# Patient Record
Sex: Female | Born: 1935
Health system: Southern US, Community
[De-identification: ages and names within clinical notes are randomized; demographics above are authoritative.]

## PROBLEM LIST (undated history)

## (undated) DIAGNOSIS — K649 Unspecified hemorrhoids: Secondary | ICD-10-CM

## (undated) DIAGNOSIS — I495 Sick sinus syndrome: Secondary | ICD-10-CM

## (undated) DIAGNOSIS — D649 Anemia, unspecified: Secondary | ICD-10-CM

## (undated) DIAGNOSIS — C50919 Malignant neoplasm of unspecified site of unspecified female breast: Secondary | ICD-10-CM

## (undated) DIAGNOSIS — I2510 Atherosclerotic heart disease of native coronary artery without angina pectoris: Secondary | ICD-10-CM

## (undated) DIAGNOSIS — I509 Heart failure, unspecified: Secondary | ICD-10-CM

## (undated) DIAGNOSIS — I499 Cardiac arrhythmia, unspecified: Secondary | ICD-10-CM

## (undated) DIAGNOSIS — J45909 Unspecified asthma, uncomplicated: Secondary | ICD-10-CM

## (undated) DIAGNOSIS — K222 Esophageal obstruction: Secondary | ICD-10-CM

## (undated) DIAGNOSIS — M810 Age-related osteoporosis without current pathological fracture: Secondary | ICD-10-CM

## (undated) DIAGNOSIS — I739 Peripheral vascular disease, unspecified: Secondary | ICD-10-CM

## (undated) DIAGNOSIS — G40909 Epilepsy, unspecified, not intractable, without status epilepticus: Secondary | ICD-10-CM

## (undated) DIAGNOSIS — E785 Hyperlipidemia, unspecified: Secondary | ICD-10-CM

## (undated) DIAGNOSIS — J449 Chronic obstructive pulmonary disease, unspecified: Secondary | ICD-10-CM

## (undated) DIAGNOSIS — Z96659 Presence of unspecified artificial knee joint: Secondary | ICD-10-CM

## (undated) DIAGNOSIS — I10 Essential (primary) hypertension: Secondary | ICD-10-CM

## (undated) DIAGNOSIS — I82409 Acute embolism and thrombosis of unspecified deep veins of unspecified lower extremity: Secondary | ICD-10-CM

## (undated) DIAGNOSIS — I255 Ischemic cardiomyopathy: Secondary | ICD-10-CM

## (undated) DIAGNOSIS — I480 Paroxysmal atrial fibrillation: Secondary | ICD-10-CM

## (undated) HISTORY — DX: Hyperlipidemia, unspecified: E78.5

## (undated) HISTORY — PX: HEMORRHOID SURGERY: SHX153

## (undated) HISTORY — DX: Cardiac arrhythmia, unspecified: I49.9

## (undated) HISTORY — DX: Chronic obstructive pulmonary disease, unspecified: J44.9

## (undated) HISTORY — DX: Ischemic cardiomyopathy: I25.5

## (undated) HISTORY — DX: Peripheral vascular disease, unspecified: I73.9

## (undated) HISTORY — DX: Unspecified hemorrhoids: K64.9

## (undated) HISTORY — DX: Sick sinus syndrome: I49.5

## (undated) HISTORY — DX: Atherosclerotic heart disease of native coronary artery without angina pectoris: I25.10

## (undated) HISTORY — DX: Unspecified asthma, uncomplicated: J45.909

## (undated) HISTORY — DX: Acute embolism and thrombosis of unspecified deep veins of unspecified lower extremity: I82.409

## (undated) HISTORY — PX: ILIAC ARTERY STENT: SHX1786

## (undated) HISTORY — DX: Essential (primary) hypertension: I10

## (undated) HISTORY — DX: Anemia, unspecified: D64.9

## (undated) HISTORY — DX: Epilepsy, unspecified, not intractable, without status epilepticus: G40.909

## (undated) HISTORY — PX: INTRAOPERATIVE ARTERIOGRAM: SHX5157

---

## 1976-09-22 DIAGNOSIS — C50919 Malignant neoplasm of unspecified site of unspecified female breast: Secondary | ICD-10-CM

## 1976-09-22 HISTORY — PX: MASTECTOMY: SHX3

## 1976-09-22 HISTORY — DX: Malignant neoplasm of unspecified site of unspecified female breast: C50.919

## 1983-09-23 HISTORY — PX: ABDOMINAL HYSTERECTOMY: SHX81

## 1983-09-23 HISTORY — PX: REDUCTION MAMMAPLASTY: SUR839

## 1998-05-21 ENCOUNTER — Encounter: Admission: RE | Admit: 1998-05-21 | Discharge: 1998-08-19 | Payer: Self-pay

## 1998-06-11 ENCOUNTER — Ambulatory Visit (HOSPITAL_COMMUNITY): Admission: RE | Admit: 1998-06-11 | Discharge: 1998-06-11 | Payer: Self-pay | Admitting: *Deleted

## 1999-01-09 ENCOUNTER — Encounter: Payer: Self-pay | Admitting: Orthopedic Surgery

## 1999-01-10 ENCOUNTER — Encounter: Payer: Self-pay | Admitting: Orthopedic Surgery

## 1999-01-10 ENCOUNTER — Inpatient Hospital Stay (HOSPITAL_COMMUNITY): Admission: RE | Admit: 1999-01-10 | Discharge: 1999-01-14 | Payer: Self-pay | Admitting: Orthopedic Surgery

## 1999-01-14 ENCOUNTER — Inpatient Hospital Stay (HOSPITAL_COMMUNITY)
Admission: RE | Admit: 1999-01-14 | Discharge: 1999-01-21 | Payer: Self-pay | Admitting: Physical Medicine & Rehabilitation

## 1999-01-14 ENCOUNTER — Encounter: Payer: Self-pay | Admitting: Physical Medicine & Rehabilitation

## 1999-05-03 ENCOUNTER — Inpatient Hospital Stay (HOSPITAL_COMMUNITY): Admission: EM | Admit: 1999-05-03 | Discharge: 1999-05-09 | Payer: Self-pay | Admitting: Orthopedic Surgery

## 2004-04-18 ENCOUNTER — Emergency Department (HOSPITAL_COMMUNITY): Admission: EM | Admit: 2004-04-18 | Discharge: 2004-04-18 | Payer: Self-pay | Admitting: Internal Medicine

## 2004-06-27 ENCOUNTER — Ambulatory Visit: Payer: Self-pay

## 2005-04-17 ENCOUNTER — Ambulatory Visit: Payer: Self-pay

## 2005-06-16 ENCOUNTER — Ambulatory Visit: Payer: Self-pay | Admitting: Unknown Physician Specialty

## 2005-06-30 ENCOUNTER — Ambulatory Visit: Payer: Self-pay | Admitting: Internal Medicine

## 2006-07-20 ENCOUNTER — Ambulatory Visit: Payer: Self-pay | Admitting: Internal Medicine

## 2006-12-16 ENCOUNTER — Ambulatory Visit: Payer: Self-pay | Admitting: Internal Medicine

## 2007-07-23 ENCOUNTER — Ambulatory Visit: Payer: Self-pay | Admitting: Internal Medicine

## 2007-10-26 ENCOUNTER — Inpatient Hospital Stay: Payer: Self-pay | Admitting: Cardiology

## 2007-10-27 ENCOUNTER — Other Ambulatory Visit: Payer: Self-pay

## 2007-10-28 ENCOUNTER — Other Ambulatory Visit: Payer: Self-pay

## 2008-07-05 ENCOUNTER — Ambulatory Visit: Payer: Self-pay | Admitting: Internal Medicine

## 2008-07-24 ENCOUNTER — Ambulatory Visit: Payer: Self-pay | Admitting: Internal Medicine

## 2009-09-04 ENCOUNTER — Ambulatory Visit: Payer: Self-pay | Admitting: Internal Medicine

## 2010-02-26 ENCOUNTER — Inpatient Hospital Stay (HOSPITAL_COMMUNITY): Admission: RE | Admit: 2010-02-26 | Discharge: 2010-03-03 | Payer: Self-pay | Admitting: Orthopaedic Surgery

## 2010-03-21 ENCOUNTER — Emergency Department (HOSPITAL_COMMUNITY): Admission: EM | Admit: 2010-03-21 | Discharge: 2010-03-21 | Payer: Self-pay | Admitting: Emergency Medicine

## 2010-11-14 ENCOUNTER — Ambulatory Visit: Payer: Self-pay | Admitting: Internal Medicine

## 2010-12-08 LAB — CBC
MCH: 32.5 pg (ref 26.0–34.0)
MCHC: 33.7 g/dL (ref 30.0–36.0)
Platelets: 268 10*3/uL (ref 150–400)
RBC: 3.24 MIL/uL — ABNORMAL LOW (ref 3.87–5.11)

## 2010-12-08 LAB — DIFFERENTIAL
Basophils Relative: 1 % (ref 0–1)
Eosinophils Absolute: 0 10*3/uL (ref 0.0–0.7)
Lymphs Abs: 1.3 10*3/uL (ref 0.7–4.0)
Neutro Abs: 2.6 10*3/uL (ref 1.7–7.7)
Neutrophils Relative %: 60 % (ref 43–77)

## 2010-12-08 LAB — BASIC METABOLIC PANEL
CO2: 28 mEq/L (ref 19–32)
Calcium: 9.2 mg/dL (ref 8.4–10.5)
Creatinine, Ser: 0.69 mg/dL (ref 0.4–1.2)
GFR calc Af Amer: >60 mL/min (ref >60)
GFR calc non Af Amer: >60 mL/min (ref >60)
Sodium: 141 mEq/L (ref 135–145)

## 2010-12-08 LAB — POCT CARDIAC MARKERS
CKMB, poc: <1 ng/mL — ABNORMAL LOW (ref 1.0–8.0)
Myoglobin, poc: 115 ng/mL (ref 12–200)

## 2010-12-09 LAB — CBC
HCT: 24.5 % — ABNORMAL LOW (ref 36.0–46.0)
Hemoglobin: 10.1 g/dL — ABNORMAL LOW (ref 12.0–15.0)
Hemoglobin: 8.4 g/dL — ABNORMAL LOW (ref 12.0–15.0)
MCHC: 34.5 g/dL (ref 30.0–36.0)
MCV: 96.7 fL (ref 78.0–100.0)
Platelets: 131 10*3/uL — ABNORMAL LOW (ref 150–400)
Platelets: 146 10*3/uL — ABNORMAL LOW (ref 150–400)
Platelets: 173 10*3/uL (ref 150–400)
RBC: 2.62 MIL/uL — ABNORMAL LOW (ref 3.87–5.11)
RDW: 12.8 % (ref 11.5–15.5)
RDW: 12.9 % (ref 11.5–15.5)
WBC: 7.1 10*3/uL (ref 4.0–10.5)
WBC: 7.6 10*3/uL (ref 4.0–10.5)
WBC: 7.7 10*3/uL (ref 4.0–10.5)

## 2010-12-09 LAB — BASIC METABOLIC PANEL
BUN: 12 mg/dL (ref 6–23)
CO2: 28 mEq/L (ref 19–32)
Calcium: 8.4 mg/dL (ref 8.4–10.5)
Calcium: 8.5 mg/dL (ref 8.4–10.5)
Calcium: 8.6 mg/dL (ref 8.4–10.5)
Calcium: 9.4 mg/dL (ref 8.4–10.5)
Chloride: 103 mEq/L (ref 96–112)
Creatinine, Ser: 0.76 mg/dL (ref 0.4–1.2)
Creatinine, Ser: 0.82 mg/dL (ref 0.4–1.2)
Creatinine, Ser: 0.9 mg/dL (ref 0.4–1.2)
GFR calc Af Amer: >60 mL/min (ref >60)
GFR calc Af Amer: >60 mL/min (ref >60)
GFR calc non Af Amer: 54 mL/min — ABNORMAL LOW (ref >60)
GFR calc non Af Amer: 59 mL/min — ABNORMAL LOW (ref >60)
GFR calc non Af Amer: >60 mL/min (ref >60)
GFR calc non Af Amer: >60 mL/min (ref >60)
Glucose, Bld: 123 mg/dL — ABNORMAL HIGH (ref 70–99)
Glucose, Bld: 89 mg/dL (ref 70–99)
Potassium: 3.6 mEq/L (ref 3.5–5.1)
Sodium: 139 mEq/L (ref 135–145)
Sodium: 140 mEq/L (ref 135–145)

## 2010-12-09 LAB — PROTIME-INR
INR: 1.12 (ref 0.00–1.49)
INR: 1.71 — ABNORMAL HIGH (ref 0.00–1.49)
INR: 2.56 — ABNORMAL HIGH (ref 0.00–1.49)
INR: 2.8 — ABNORMAL HIGH (ref 0.00–1.49)
INR: 2.9 — ABNORMAL HIGH (ref 0.00–1.49)
Prothrombin Time: 14.3 seconds (ref 11.6–15.2)
Prothrombin Time: 14.9 seconds (ref 11.6–15.2)
Prothrombin Time: 27.3 seconds — ABNORMAL HIGH (ref 11.6–15.2)
Prothrombin Time: 29.3 seconds — ABNORMAL HIGH (ref 11.6–15.2)

## 2011-03-19 ENCOUNTER — Other Ambulatory Visit (HOSPITAL_COMMUNITY): Payer: Self-pay | Admitting: Orthopaedic Surgery

## 2011-03-19 DIAGNOSIS — T84038A Mechanical loosening of other internal prosthetic joint, initial encounter: Secondary | ICD-10-CM

## 2011-03-28 ENCOUNTER — Ambulatory Visit (HOSPITAL_COMMUNITY)
Admission: RE | Admit: 2011-03-28 | Discharge: 2011-03-28 | Disposition: A | Discharge status: Home / Self Care | Payer: Medicare Other | Source: Ambulatory Visit | Attending: Orthopaedic Surgery | Admitting: Orthopaedic Surgery

## 2011-03-28 ENCOUNTER — Encounter (HOSPITAL_COMMUNITY)
Admission: RE | Admit: 2011-03-28 | Discharge: 2011-03-28 | Disposition: A | Discharge status: Home / Self Care | Payer: Medicare Other | Source: Ambulatory Visit | Attending: Orthopaedic Surgery | Admitting: Orthopaedic Surgery

## 2011-03-28 ENCOUNTER — Other Ambulatory Visit (HOSPITAL_COMMUNITY): Payer: Self-pay | Admitting: Orthopaedic Surgery

## 2011-03-28 ENCOUNTER — Encounter (HOSPITAL_COMMUNITY): Payer: Self-pay

## 2011-03-28 DIAGNOSIS — Z853 Personal history of malignant neoplasm of breast: Secondary | ICD-10-CM | POA: Insufficient documentation

## 2011-03-28 DIAGNOSIS — T84038A Mechanical loosening of other internal prosthetic joint, initial encounter: Secondary | ICD-10-CM

## 2011-03-28 DIAGNOSIS — M47817 Spondylosis without myelopathy or radiculopathy, lumbosacral region: Secondary | ICD-10-CM | POA: Insufficient documentation

## 2011-03-28 DIAGNOSIS — M25569 Pain in unspecified knee: Secondary | ICD-10-CM | POA: Insufficient documentation

## 2011-03-28 DIAGNOSIS — Z96659 Presence of unspecified artificial knee joint: Secondary | ICD-10-CM | POA: Insufficient documentation

## 2011-03-28 HISTORY — DX: Malignant neoplasm of unspecified site of unspecified female breast: C50.919

## 2011-03-28 HISTORY — DX: Presence of unspecified artificial knee joint: Z96.659

## 2011-03-28 MED ORDER — TECHNETIUM TC 99M MEDRONATE IV KIT
23.5000 | PACK | Freq: Once | INTRAVENOUS | Status: AC | PRN
  Administered 2011-03-28: 23.5 via INTRAVENOUS

## 2011-12-04 ENCOUNTER — Ambulatory Visit: Payer: Self-pay | Admitting: Internal Medicine

## 2012-11-12 ENCOUNTER — Other Ambulatory Visit (HOSPITAL_COMMUNITY): Payer: Self-pay | Admitting: Orthopedic Surgery

## 2012-11-23 ENCOUNTER — Encounter (HOSPITAL_COMMUNITY)
Admission: RE | Admit: 2012-11-23 | Discharge: 2012-11-23 | Disposition: A | Discharge status: Home / Self Care | Payer: Medicare Other | Source: Ambulatory Visit | Attending: Orthopedic Surgery | Admitting: Orthopedic Surgery

## 2012-11-23 DIAGNOSIS — M25561 Pain in right knee: Secondary | ICD-10-CM

## 2012-11-23 DIAGNOSIS — M25569 Pain in unspecified knee: Secondary | ICD-10-CM | POA: Insufficient documentation

## 2012-11-23 MED ORDER — TECHNETIUM TC 99M MEDRONATE IV KIT
25.0000 | PACK | Freq: Once | INTRAVENOUS | Status: AC | PRN
  Administered 2012-11-23: 25 via INTRAVENOUS

## 2013-03-30 ENCOUNTER — Ambulatory Visit: Payer: Self-pay | Admitting: Internal Medicine

## 2013-12-29 DIAGNOSIS — I10 Essential (primary) hypertension: Secondary | ICD-10-CM | POA: Insufficient documentation

## 2013-12-29 DIAGNOSIS — E785 Hyperlipidemia, unspecified: Secondary | ICD-10-CM | POA: Insufficient documentation

## 2014-03-06 DIAGNOSIS — R0602 Shortness of breath: Secondary | ICD-10-CM | POA: Insufficient documentation

## 2014-07-04 ENCOUNTER — Ambulatory Visit: Payer: Self-pay | Admitting: Internal Medicine

## 2014-10-27 ENCOUNTER — Other Ambulatory Visit: Payer: Self-pay | Admitting: Otolaryngology

## 2014-10-27 DIAGNOSIS — J029 Acute pharyngitis, unspecified: Secondary | ICD-10-CM

## 2014-10-27 DIAGNOSIS — H9201 Otalgia, right ear: Secondary | ICD-10-CM

## 2014-11-01 ENCOUNTER — Ambulatory Visit
Admission: RE | Admit: 2014-11-01 | Discharge: 2014-11-01 | Disposition: A | Discharge status: Home / Self Care | Payer: Medicare Other | Source: Ambulatory Visit | Attending: Otolaryngology | Admitting: Otolaryngology

## 2014-11-01 DIAGNOSIS — J029 Acute pharyngitis, unspecified: Secondary | ICD-10-CM

## 2014-11-01 DIAGNOSIS — H9201 Otalgia, right ear: Secondary | ICD-10-CM

## 2014-11-01 MED ORDER — IOHEXOL 300 MG/ML  SOLN
75.0000 mL | Freq: Once | INTRAMUSCULAR | Status: AC | PRN
  Administered 2014-11-01: 75 mL via INTRAVENOUS

## 2014-11-21 ENCOUNTER — Encounter: Payer: Self-pay | Admitting: Vascular Surgery

## 2014-12-04 ENCOUNTER — Encounter: Payer: Self-pay | Admitting: Vascular Surgery

## 2014-12-05 ENCOUNTER — Ambulatory Visit (INDEPENDENT_AMBULATORY_CARE_PROVIDER_SITE_OTHER): Payer: Medicare Other | Admitting: Vascular Surgery

## 2014-12-05 ENCOUNTER — Encounter: Payer: Self-pay | Admitting: Vascular Surgery

## 2014-12-05 VITALS — BP 176/101 | HR 73 | Resp 16 | Ht 63.0 in | Wt 184.0 lb

## 2014-12-05 DIAGNOSIS — I748 Embolism and thrombosis of other arteries: Secondary | ICD-10-CM | POA: Diagnosis not present

## 2014-12-05 DIAGNOSIS — I708 Atherosclerosis of other arteries: Secondary | ICD-10-CM

## 2014-12-05 NOTE — Progress Notes (Signed)
Patient name: [name] [name] MRN: [id number] DOB: [birth year] Sex: female   Referred by: Janace Hoard  Reason for referral:  Chief Complaint  Patient presents with  . New Evaluation    Subclavian artery occlusion referred by Dr Melissa Montane    HISTORY OF PRESENT ILLNESS: The patient is today for evaluation of incidental finding of left subclavian artery calcification and probable total occlusion. She has a long history of COPD and was having increased shortness of breath, sore throat and hoarseness. ENT evaluation with Dr. Janace Hoard included CT scan. I have reviewed her actual images and the report of this. This does show complete calcification and probable total occlusion of the origin of her subclavian artery. The artery distal to this is widely patent. He is seen today for further discussion of this. She does have arthritic changes in her shoulders bilaterally with a prior 2 different operations on her right shoulder. She reports that she has bilateral arm fatigue. No worse left than right. She is left-handed. She has had prior left breast surgery and therefore does not ever take blood pressure from her left arms and is not a differential in blood pressure in the past. She does have a history of irregular heart rate and does have a permanent pacemaker.  Past Medical History  Diagnosis Date  . Breast cancer   . History of knee replacement, total   . Hypertension   . Peripheral arterial disease   . CAD (coronary artery disease)   . Hemorrhoids   . Seizure disorder   . Ischemic cardiomyopathy   . DVT (deep venous thrombosis)     Past Surgical History  Procedure Laterality Date  . Hemorrhoid surgery    . Intraoperative arteriogram Right   . Iliac artery stent      History   Social History  . Marital Status: Widowed    Spouse Name: N/A  . Number of Children: N/A  . Years of Education: N/A   Occupational History  . Not on file.   Social History Main Topics  . Smoking status: Former  Smoker    Quit date: 12/05/1986  . Smokeless tobacco: Never Used  . Alcohol Use: No  . Drug Use: No  . Sexual Activity: Not on file   Other Topics Concern  . Not on file   Social History Narrative    Family History  Problem Relation Age of Onset  . Cancer Mother     breast  . Hypertension Father   . Heart disease Father   . Clotting disorder Father   . Heart disease Sister   . Thyroid disease Sister   . Diabetes Sister     Allergies as of 12/05/2014 - Review Complete 12/05/2014  Allergen Reaction Noted  . Penicillins Anaphylaxis 11/21/2014  . Nsaids  12/05/2014    Current Outpatient Prescriptions on File Prior to Visit  Medication Sig Dispense Refill  . aspirin 325 MG EC tablet Take 325 mg by mouth daily.    Marland Kitchen atorvastatin (LIPITOR) 10 MG tablet Take 10 mg by mouth daily.    . furosemide (LASIX) 40 MG tablet Take 20 mg by mouth.     . carvedilol (COREG) 25 MG tablet Take 25 mg by mouth 2 (two) times daily with a meal.    . cloNIDine (CATAPRES) 0.2 MG tablet Take 0.2 mg by mouth 2 (two) times daily.    . clopidogrel (PLAVIX) 75 MG tablet Take 75 mg by mouth daily.    Marland Kitchen  ezetimibe (ZETIA) 10 MG tablet Take 10 mg by mouth daily.    Marland Kitchen olmesartan-hydrochlorothiazide (BENICAR HCT) 40-25 MG per tablet Take 1 tablet by mouth daily.    . sildenafil (VIAGRA) 100 MG tablet Take 100 mg by mouth daily as needed for erectile dysfunction.    Marland Kitchen Umeclidinium-Vilanterol 62.5-25 MCG/INH AEPB Inhale into the lungs.    . vardenafil (LEVITRA) 20 MG tablet Take 20 mg by mouth daily as needed for erectile dysfunction.     No current facility-administered medications on file prior to visit.     REVIEW OF SYSTEMS:  Positives indicated with an "X"  CARDIOVASCULAR:  [ ]  chest pain   [ ]  chest pressure   [ ]  palpitations   [ x] orthopnea   [x ] dyspnea on exertion   [ ]  claudication   [ ]  rest pain   [ ]  DVT   [ ]  phlebitis PULMONARY:   [ ]  productive cough   [ ]  asthma   [ ]   wheezing NEUROLOGIC:   [ x] weakness  [ ]  paresthesias  [ ]  aphasia  [ ]  amaurosis  [ ]  dizziness HEMATOLOGIC:   [ ]  bleeding problems   [ ]  clotting disorders MUSCULOSKELETAL:  [ ]  joint pain   [ ]  joint swelling GASTROINTESTINAL: [ ]   blood in stool  [ ]   hematemesis GENITOURINARY:  [ ]   dysuria  [ ]   hematuria PSYCHIATRIC:  [ ]  history of major depression INTEGUMENTARY:  [ ]  rashes  [ ]  ulcers CONSTITUTIONAL:  [ ]  fever   [ ]  chills  PHYSICAL EXAMINATION:  General: The patient is a well-nourished female, in no acute distress. Vital signs are BP 176/101 mmHg  Pulse 73  Resp 16  Ht 5\' 3"  (1.6 m)  Wt 184 lb (83.462 kg)  BMI 32.60 kg/m2 Pulmonary: There is a good air exchange bilaterally without wheezing or rales. Abdomen: Soft and non-tender with normal pitch bowel sounds. Musculoskeletal: There are no major deformities.  There is no significant extremity pain. Neurologic: No focal weakness or paresthesias are detected, Skin: There are no ulcer or rashes noted. Psychiatric: The patient has normal affect. Cardiovascular: There is a regular rate and rhythm without significant murmur appreciated. Pulse status: 2+ radial pulses which are equal bilaterally. 2+ dorsalis pedis pulses which are equal bilaterally as well Carotid arteries without bruits  CT scan was reviewed and discussed with the patient. This shows minimal calcification her carotid bifurcation bilaterally. She does have extensive calcification in various areas of her aortic arch and the apparent total occlusion with calcified plaque of her proximal subclavian takeoff on the left.2  Impression and Plan:  A symptomatic left subclavian artery occlusion. I discussed this at great length with the patient. She has no posterior circulation type symptoms and no evidence of ischemia in her left arm. No prior history of embolic events. I explained that this is an incidental finding and does not cause her any risk for stroke or other  major medical problem. Explained would not recommend any further evaluation or follow-up regarding this. She was reassured with this discussion will see [place] again on an as-needed basis    Sanela [name] [facility] Office: [phone]

## 2015-03-23 DIAGNOSIS — I4891 Unspecified atrial fibrillation: Secondary | ICD-10-CM | POA: Insufficient documentation

## 2015-09-10 ENCOUNTER — Encounter (HOSPITAL_COMMUNITY): Payer: Self-pay | Admitting: Family Medicine

## 2015-09-10 ENCOUNTER — Emergency Department (HOSPITAL_COMMUNITY)
Admission: EM | Admit: 2015-09-10 | Discharge: 2015-09-10 | Disposition: A | Discharge status: Home / Self Care | Payer: Medicare Other | Attending: Emergency Medicine | Admitting: Emergency Medicine

## 2015-09-10 DIAGNOSIS — I10 Essential (primary) hypertension: Secondary | ICD-10-CM | POA: Diagnosis not present

## 2015-09-10 DIAGNOSIS — Z880 Allergy status to penicillin: Secondary | ICD-10-CM | POA: Diagnosis not present

## 2015-09-10 DIAGNOSIS — Z96659 Presence of unspecified artificial knee joint: Secondary | ICD-10-CM | POA: Insufficient documentation

## 2015-09-10 DIAGNOSIS — I2510 Atherosclerotic heart disease of native coronary artery without angina pectoris: Secondary | ICD-10-CM | POA: Diagnosis not present

## 2015-09-10 DIAGNOSIS — Z853 Personal history of malignant neoplasm of breast: Secondary | ICD-10-CM | POA: Insufficient documentation

## 2015-09-10 DIAGNOSIS — Z87891 Personal history of nicotine dependence: Secondary | ICD-10-CM | POA: Diagnosis not present

## 2015-09-10 DIAGNOSIS — Z8719 Personal history of other diseases of the digestive system: Secondary | ICD-10-CM | POA: Insufficient documentation

## 2015-09-10 DIAGNOSIS — Z86718 Personal history of other venous thrombosis and embolism: Secondary | ICD-10-CM | POA: Insufficient documentation

## 2015-09-10 DIAGNOSIS — Z9861 Coronary angioplasty status: Secondary | ICD-10-CM | POA: Diagnosis not present

## 2015-09-10 DIAGNOSIS — Z8669 Personal history of other diseases of the nervous system and sense organs: Secondary | ICD-10-CM | POA: Diagnosis not present

## 2015-09-10 DIAGNOSIS — Y9389 Activity, other specified: Secondary | ICD-10-CM | POA: Insufficient documentation

## 2015-09-10 DIAGNOSIS — Y9289 Other specified places as the place of occurrence of the external cause: Secondary | ICD-10-CM | POA: Diagnosis not present

## 2015-09-10 DIAGNOSIS — S00531A Contusion of lip, initial encounter: Secondary | ICD-10-CM

## 2015-09-10 DIAGNOSIS — Z7982 Long term (current) use of aspirin: Secondary | ICD-10-CM | POA: Insufficient documentation

## 2015-09-10 DIAGNOSIS — Z950 Presence of cardiac pacemaker: Secondary | ICD-10-CM | POA: Insufficient documentation

## 2015-09-10 DIAGNOSIS — Z79899 Other long term (current) drug therapy: Secondary | ICD-10-CM | POA: Diagnosis not present

## 2015-09-10 DIAGNOSIS — Y998 Other external cause status: Secondary | ICD-10-CM | POA: Insufficient documentation

## 2015-09-10 DIAGNOSIS — X58XXXA Exposure to other specified factors, initial encounter: Secondary | ICD-10-CM | POA: Diagnosis not present

## 2015-09-10 DIAGNOSIS — S0993XA Unspecified injury of face, initial encounter: Secondary | ICD-10-CM | POA: Diagnosis present

## 2015-09-10 DIAGNOSIS — Z7902 Long term (current) use of antithrombotics/antiplatelets: Secondary | ICD-10-CM | POA: Diagnosis not present

## 2015-09-10 LAB — COMPREHENSIVE METABOLIC PANEL
ALBUMIN: 3.7 g/dL (ref 3.5–5.0)
ALK PHOS: 92 U/L (ref 38–126)
ALT: 14 U/L (ref 14–54)
AST: 23 U/L (ref 15–41)
Anion gap: 10 (ref 5–15)
BUN: 21 mg/dL — AB (ref 6–20)
CALCIUM: 9.3 mg/dL (ref 8.9–10.3)
CO2: 25 mmol/L (ref 22–32)
CREATININE: 1.01 mg/dL — AB (ref 0.44–1.00)
Chloride: 107 mmol/L (ref 101–111)
GFR calc Af Amer: 57 mL/min — ABNORMAL LOW (ref >60)
GFR calc non Af Amer: 49 mL/min — ABNORMAL LOW (ref >60)
GLUCOSE: 112 mg/dL — AB (ref 65–99)
Potassium: 4.1 mmol/L (ref 3.5–5.1)
Sodium: 142 mmol/L (ref 135–145)
Total Bilirubin: 0.7 mg/dL (ref 0.3–1.2)
Total Protein: 6.8 g/dL (ref 6.5–8.1)

## 2015-09-10 LAB — CBC WITH DIFFERENTIAL/PLATELET
BASOS PCT: 0 %
Basophils Absolute: 0 10*3/uL (ref 0.0–0.1)
EOS PCT: 1 %
Eosinophils Absolute: 0.1 10*3/uL (ref 0.0–0.7)
HCT: 39.6 % (ref 36.0–46.0)
HEMOGLOBIN: 12.8 g/dL (ref 12.0–15.0)
Lymphocytes Relative: 26 %
Lymphs Abs: 2.1 10*3/uL (ref 0.7–4.0)
MCH: 29.6 pg (ref 26.0–34.0)
MCHC: 32.3 g/dL (ref 30.0–36.0)
MCV: 91.7 fL (ref 78.0–100.0)
Monocytes Absolute: 0.7 10*3/uL (ref 0.1–1.0)
Monocytes Relative: 8 %
NEUTROS PCT: 65 %
Neutro Abs: 5.3 10*3/uL (ref 1.7–7.7)
Platelets: 183 10*3/uL (ref 150–400)
RBC: 4.32 MIL/uL (ref 3.87–5.11)
RDW: 13.9 % (ref 11.5–15.5)
WBC: 8.2 10*3/uL (ref 4.0–10.5)

## 2015-09-10 LAB — PROTIME-INR
INR: 3.65 — ABNORMAL HIGH (ref 0.00–1.49)
Prothrombin Time: 35.4 seconds — ABNORMAL HIGH (ref 11.6–15.2)

## 2015-09-10 NOTE — ED Provider Notes (Signed)
CSN: [id number]     Arrival date & time 09/10/15  1703 History   First MD Initiated Contact with Patient 09/10/15 1932     Chief Complaint  Patient presents with  . Bleeding/Bruising     (Consider location/radiation/quality/duration/timing/severity/associated sxs/prior Treatment) HPI Comments:  Patient presents with pain and swelling and bruising to her lower lip. She states she first noticed it around 3 or 4 PM. He does not recall any falls or trauma. She is concerned because she takes aspirin as well as Coumadin. She is no longer on Plavix. No difficulty breathing or swallowing. No chest pain. No shortness of breath. States she was started on Coumadin several months ago for unclear reasons per her. Chart review shows that she has a history of proximal atrial fibrillation and sick sinus syndrome with pacemaker implanted. She also has a known left subclavian arterial occlusion that was deemed not clinically significant by vascular surgery.  The history is provided by the patient. The history is limited by the condition of the patient.    Past Medical History  Diagnosis Date  . Breast cancer ([place])   . History of knee replacement, total   . Hypertension   . Peripheral arterial disease ([place])   . CAD (coronary artery disease)   . Hemorrhoids   . Seizure disorder ([place])   . Ischemic cardiomyopathy   . DVT (deep venous thrombosis) [facility])    Past Surgical History  Procedure Laterality Date  . Hemorrhoid surgery    . Intraoperative arteriogram Right   . Iliac artery stent     Family History  Problem Relation Age of Onset  . Cancer Mother     breast  . Hypertension Father   . Heart disease Father   . Clotting disorder Father   . Heart disease Sister   . Thyroid disease Sister   . Diabetes Sister    Social History  Substance Use Topics  . Smoking status: Former Smoker    Quit date: 12/05/1986  . Smokeless tobacco: Never Used  . Alcohol Use: No   OB History    No data  available     Review of Systems  Constitutional: Negative for fever, activity change and appetite change.  HENT: Negative for congestion.   Eyes: Negative for visual disturbance.  Respiratory: Negative for cough, chest tightness and shortness of breath.   Cardiovascular: Negative for chest pain.  Gastrointestinal: Negative for nausea, vomiting and abdominal pain.  Genitourinary: Negative for dysuria, hematuria, vaginal bleeding and vaginal discharge.  Musculoskeletal: Negative for myalgias and arthralgias.  Skin: Positive for wound.  Neurological: Negative for dizziness, weakness, light-headedness, numbness and headaches.  A complete 10 system review of systems was obtained and all systems are negative except as noted in the HPI and PMH.      Allergies  Penicillins and Nsaids  Home Medications   Prior to Admission medications   Medication Sig Start Date End Date Taking? Authorizing Provider  albuterol (PROVENTIL HFA;VENTOLIN HFA) 108 (90 BASE) MCG/ACT inhaler Inhale 2 puffs into the lungs every 6 (six) hours as needed for wheezing or shortness of breath.    Historical Provider, MD  Albuterol Sulfate (PROAIR HFA IN) Inhale into the lungs.    Historical Provider, MD  amLODipine (NORVASC) 2.5 MG tablet Take 2.5 mg by mouth daily.    Historical Provider, MD  aspirin 325 MG EC tablet Take 325 mg by mouth daily.    Historical Provider, MD  atorvastatin (LIPITOR) 10 MG tablet Take 10  mg by mouth daily.    Historical Provider, MD  carvedilol (COREG) 25 MG tablet Take 25 mg by mouth 2 (two) times daily with a meal.    Historical Provider, MD  cloNIDine (CATAPRES) 0.2 MG tablet Take 0.2 mg by mouth 2 (two) times daily.    Historical Provider, MD  clopidogrel (PLAVIX) 75 MG tablet Take 75 mg by mouth daily.    Historical Provider, MD  ezetimibe (ZETIA) 10 MG tablet Take 10 mg by mouth daily.    Historical Provider, MD  furosemide (LASIX) 40 MG tablet Take 20 mg by mouth.     Historical  Provider, MD  metoprolol succinate (TOPROL-XL) 100 MG 24 hr tablet Take 100 mg by mouth daily. Take with or immediately following a meal.    Historical Provider, MD  montelukast (SINGULAIR) 10 MG tablet Take 10 mg by mouth at bedtime.    Historical Provider, MD  olmesartan-hydrochlorothiazide (BENICAR HCT) 40-25 MG per tablet Take 1 tablet by mouth daily.    Historical Provider, MD  omeprazole (PRILOSEC) 40 MG capsule Take 40 mg by mouth daily.    Historical Provider, MD  quinapril (ACCUPRIL) 40 MG tablet Take 40 mg by mouth daily.    Historical Provider, MD  sildenafil (VIAGRA) 100 MG tablet Take 100 mg by mouth daily as needed for erectile dysfunction.    Historical Provider, MD  Umeclidinium-Vilanterol 62.5-25 MCG/INH AEPB Inhale into the lungs.    Historical Provider, MD  vardenafil (LEVITRA) 20 MG tablet Take 20 mg by mouth daily as needed for erectile dysfunction.    Historical Provider, MD   BP 193/84 mmHg  Pulse 72  Temp(Src) 97.8 F (36.6 C) (Oral)  Resp 16  SpO2 97% Physical Exam  Constitutional: She is oriented to person, place, and time. She appears well-developed and well-nourished. No distress.  HENT:  Head: Normocephalic and atraumatic.  Mouth/Throat: Oropharynx is clear and moist. No oropharyngeal exudate.   Ecchymosis to lower lip without any significant swelling. No swelling of tongue, posterior pharynx or floor of mouth.  Patient has artificial teeth.  There is no bleeding or active drainage.  Eyes: Conjunctivae and EOM are normal. Pupils are equal, round, and reactive to light.  Neck: Normal range of motion. Neck supple.  No meningismus.  Cardiovascular: Normal rate, regular rhythm, normal heart sounds and intact distal pulses.   No murmur heard. Pulmonary/Chest: Effort normal and breath sounds normal. No respiratory distress.  Abdominal: Soft. There is no tenderness. There is no rebound and no guarding.  Musculoskeletal: Normal range of motion. She exhibits no  edema or tenderness.  Neurological: She is alert and oriented to person, place, and time. No cranial nerve deficit. She exhibits normal muscle tone. Coordination normal.  No ataxia on finger to nose bilaterally. No pronator drift. 5/5 strength throughout. CN 2-12 intact.Equal grip strength. Sensation intact.   Skin: Skin is warm.  Psychiatric: She has a normal mood and affect. Her behavior is normal.  Nursing note and vitals reviewed.   ED Course  Procedures (including critical care time) Labs Review Labs Reviewed  COMPREHENSIVE METABOLIC PANEL - Abnormal; Notable for the following:    Glucose, Bld 112 (*)    BUN 21 (*)    Creatinine, Ser 1.01 (*)    GFR calc non Af Amer 49 (*)    GFR calc Af Amer 57 (*)    All other components within normal limits  PROTIME-INR - Abnormal; Notable for the following:    Prothrombin Time 35.4 (*)  INR 3.65 (*)    All other components within normal limits  CBC WITH DIFFERENTIAL/PLATELET    Imaging Review No results found. I have personally reviewed and evaluated these images and lab results as part of my medical decision-making.   EKG Interpretation   Date/Time:  Monday September 10 2015 19:55:46 EST Ventricular Rate:  70 PR Interval:  207 QRS Duration: 100 QT Interval:  428 QTC Calculation: 462 R Axis:   -18 Text Interpretation:  Atrial-paced complexes Borderline left axis  deviation No significant change was found Confirmed by Wyvonnia Dusky  MD,  Halvor Behrend [phone]) on 09/10/2015 8:25:25 PM      MDM   Final diagnoses:  Contusion, lip, initial encounter    ecchymosis to lower lip. Denies trauma. Looks like it was likely bitten.  no chest pain or shortness of breath. There is no lip swelling. There is no evidence of angioedema.  Hemoglobin improved from baseline. Platelets are normal. INR 3.65.   Instructed patient to hold 2 doses of Coumadin and call her Coumadin clinic in the morning for further adjustments of her dosing.   call her  Coumadin clinic in the morning for further adjustments of her Coumadin dose. Advised her that the ecchymosis and bruising of her lip should separate on its own. Return to the ED with difficulty breathing, difficulty swallowing or any other concerns.    Ezequiel Essex, MD 09/10/15 [phone]

## 2015-09-10 NOTE — ED Notes (Signed)
Pt here for bruising to lower lip. sts that she woke up that way. Pt is on blood thinner. Denies injury

## 2015-09-10 NOTE — Discharge Instructions (Signed)
Contusion  call your Coumadin dose for one day. Call your Coumadin clinic and tell them your INR was 3.65 today. They will adjust her dose further. Return to the ED if he develop new or worsening symptoms. A contusion is a deep bruise. Contusions are the result of a blunt injury to tissues and muscle fibers under the skin. The injury causes bleeding under the skin. The skin overlying the contusion may turn blue, purple, or yellow. Minor injuries will give you a painless contusion, but more severe contusions may stay painful and swollen for a few weeks.  CAUSES  This condition is usually caused by a blow, trauma, or direct force to an area of the body. SYMPTOMS  Symptoms of this condition include:  Swelling of the injured area.  Pain and tenderness in the injured area.  Discoloration. The area may have redness and then turn blue, purple, or yellow. DIAGNOSIS  This condition is diagnosed based on a physical exam and medical history. An X-ray, CT scan, or MRI may be needed to determine if there are any associated injuries, such as broken bones (fractures). TREATMENT  Specific treatment for this condition depends on what area of the body was injured. In general, the best treatment for a contusion is resting, icing, applying pressure to (compression), and elevating the injured area. This is often called the RICE strategy. Over-the-counter anti-inflammatory medicines may also be recommended for pain control.  HOME CARE INSTRUCTIONS   Rest the injured area.  If directed, apply ice to the injured area:  Put ice in a plastic bag.  Place a towel between your skin and the bag.  Leave the ice on for 20 minutes, 2-3 times per day.  If directed, apply light compression to the injured area using an elastic bandage. Make sure the bandage is not wrapped too tightly. Remove and reapply the bandage as directed by your health care provider.  If possible, raise (elevate) the injured area above the level of  your heart while you are sitting or lying down.  Take over-the-counter and prescription medicines only as told by your health care provider. SEEK MEDICAL CARE IF:  Your symptoms do not improve after several days of treatment.  Your symptoms get worse.  You have difficulty moving the injured area. SEEK IMMEDIATE MEDICAL CARE IF:   You have severe pain.  You have numbness in a hand or foot.  Your hand or foot turns pale or cold.   This information is not intended to replace advice given to you by your health care provider. Make sure you discuss any questions you have with your health care provider.   Document Released: 06/18/2005 Document Revised: 05/30/2015 Document Reviewed: 01/24/2015 Elsevier Interactive Patient Education [employer].

## 2015-09-10 NOTE — ED Notes (Signed)
MD at bedside. 

## 2015-10-05 DIAGNOSIS — I482 Chronic atrial fibrillation: Secondary | ICD-10-CM | POA: Diagnosis not present

## 2015-10-12 DIAGNOSIS — N3941 Urge incontinence: Secondary | ICD-10-CM | POA: Diagnosis not present

## 2015-10-12 DIAGNOSIS — E538 Deficiency of other specified B group vitamins: Secondary | ICD-10-CM | POA: Diagnosis not present

## 2015-10-12 DIAGNOSIS — M25552 Pain in left hip: Secondary | ICD-10-CM | POA: Diagnosis not present

## 2015-10-12 DIAGNOSIS — I495 Sick sinus syndrome: Secondary | ICD-10-CM | POA: Diagnosis not present

## 2015-10-12 DIAGNOSIS — E7800 Pure hypercholesterolemia, unspecified: Secondary | ICD-10-CM | POA: Diagnosis not present

## 2015-10-12 DIAGNOSIS — Y92099 Unspecified place in other non-institutional residence as the place of occurrence of the external cause: Secondary | ICD-10-CM | POA: Diagnosis not present

## 2015-10-12 DIAGNOSIS — W19XXXA Unspecified fall, initial encounter: Secondary | ICD-10-CM | POA: Diagnosis not present

## 2015-10-12 DIAGNOSIS — D649 Anemia, unspecified: Secondary | ICD-10-CM | POA: Diagnosis not present

## 2015-10-12 DIAGNOSIS — I10 Essential (primary) hypertension: Secondary | ICD-10-CM | POA: Diagnosis not present

## 2015-10-12 DIAGNOSIS — R0602 Shortness of breath: Secondary | ICD-10-CM | POA: Diagnosis not present

## 2015-10-12 DIAGNOSIS — J411 Mucopurulent chronic bronchitis: Secondary | ICD-10-CM | POA: Diagnosis not present

## 2015-10-22 DIAGNOSIS — M4806 Spinal stenosis, lumbar region: Secondary | ICD-10-CM | POA: Diagnosis not present

## 2015-10-22 DIAGNOSIS — Z96642 Presence of left artificial hip joint: Secondary | ICD-10-CM | POA: Diagnosis not present

## 2015-10-22 DIAGNOSIS — M25552 Pain in left hip: Secondary | ICD-10-CM | POA: Diagnosis not present

## 2015-10-22 DIAGNOSIS — M545 Low back pain: Secondary | ICD-10-CM | POA: Diagnosis not present

## 2015-10-24 DIAGNOSIS — D649 Anemia, unspecified: Secondary | ICD-10-CM | POA: Diagnosis not present

## 2015-11-01 DIAGNOSIS — I482 Chronic atrial fibrillation: Secondary | ICD-10-CM | POA: Diagnosis not present

## 2015-11-08 DIAGNOSIS — I482 Chronic atrial fibrillation: Secondary | ICD-10-CM | POA: Diagnosis not present

## 2015-11-22 DIAGNOSIS — I482 Chronic atrial fibrillation: Secondary | ICD-10-CM | POA: Diagnosis not present

## 2015-11-27 DIAGNOSIS — J411 Mucopurulent chronic bronchitis: Secondary | ICD-10-CM | POA: Diagnosis not present

## 2015-11-27 DIAGNOSIS — R5383 Other fatigue: Secondary | ICD-10-CM | POA: Diagnosis not present

## 2015-11-27 DIAGNOSIS — I480 Paroxysmal atrial fibrillation: Secondary | ICD-10-CM | POA: Diagnosis not present

## 2015-11-27 DIAGNOSIS — I10 Essential (primary) hypertension: Secondary | ICD-10-CM | POA: Diagnosis not present

## 2015-11-27 DIAGNOSIS — Z950 Presence of cardiac pacemaker: Secondary | ICD-10-CM | POA: Diagnosis not present

## 2015-11-27 DIAGNOSIS — N3941 Urge incontinence: Secondary | ICD-10-CM | POA: Diagnosis not present

## 2015-12-20 DIAGNOSIS — I482 Chronic atrial fibrillation: Secondary | ICD-10-CM | POA: Diagnosis not present

## 2016-01-01 DIAGNOSIS — I482 Chronic atrial fibrillation: Secondary | ICD-10-CM | POA: Diagnosis not present

## 2016-01-03 DIAGNOSIS — I480 Paroxysmal atrial fibrillation: Secondary | ICD-10-CM | POA: Diagnosis not present

## 2016-01-03 DIAGNOSIS — R001 Bradycardia, unspecified: Secondary | ICD-10-CM | POA: Diagnosis not present

## 2016-01-03 DIAGNOSIS — J410 Simple chronic bronchitis: Secondary | ICD-10-CM | POA: Diagnosis not present

## 2016-01-03 DIAGNOSIS — I495 Sick sinus syndrome: Secondary | ICD-10-CM | POA: Diagnosis not present

## 2016-01-03 DIAGNOSIS — I10 Essential (primary) hypertension: Secondary | ICD-10-CM | POA: Diagnosis not present

## 2016-01-03 DIAGNOSIS — R0602 Shortness of breath: Secondary | ICD-10-CM | POA: Diagnosis not present

## 2016-01-03 DIAGNOSIS — E7800 Pure hypercholesterolemia, unspecified: Secondary | ICD-10-CM | POA: Diagnosis not present

## 2016-01-03 DIAGNOSIS — Z950 Presence of cardiac pacemaker: Secondary | ICD-10-CM | POA: Diagnosis not present

## 2016-01-17 DIAGNOSIS — I482 Chronic atrial fibrillation: Secondary | ICD-10-CM | POA: Diagnosis not present

## 2016-02-06 DIAGNOSIS — Z961 Presence of intraocular lens: Secondary | ICD-10-CM | POA: Diagnosis not present

## 2016-02-06 DIAGNOSIS — H52203 Unspecified astigmatism, bilateral: Secondary | ICD-10-CM | POA: Diagnosis not present

## 2016-02-14 DIAGNOSIS — I482 Chronic atrial fibrillation: Secondary | ICD-10-CM | POA: Diagnosis not present

## 2016-02-26 DIAGNOSIS — I480 Paroxysmal atrial fibrillation: Secondary | ICD-10-CM | POA: Diagnosis not present

## 2016-02-26 DIAGNOSIS — R319 Hematuria, unspecified: Secondary | ICD-10-CM | POA: Diagnosis not present

## 2016-02-26 DIAGNOSIS — R791 Abnormal coagulation profile: Secondary | ICD-10-CM | POA: Diagnosis not present

## 2016-03-04 DIAGNOSIS — I482 Chronic atrial fibrillation: Secondary | ICD-10-CM | POA: Diagnosis not present

## 2016-03-26 DIAGNOSIS — N3941 Urge incontinence: Secondary | ICD-10-CM | POA: Diagnosis not present

## 2016-03-26 DIAGNOSIS — Z950 Presence of cardiac pacemaker: Secondary | ICD-10-CM | POA: Diagnosis not present

## 2016-03-26 DIAGNOSIS — I10 Essential (primary) hypertension: Secondary | ICD-10-CM | POA: Diagnosis not present

## 2016-03-26 DIAGNOSIS — J411 Mucopurulent chronic bronchitis: Secondary | ICD-10-CM | POA: Diagnosis not present

## 2016-03-26 DIAGNOSIS — I480 Paroxysmal atrial fibrillation: Secondary | ICD-10-CM | POA: Diagnosis not present

## 2016-04-02 DIAGNOSIS — I10 Essential (primary) hypertension: Secondary | ICD-10-CM | POA: Diagnosis not present

## 2016-04-02 DIAGNOSIS — I482 Chronic atrial fibrillation: Secondary | ICD-10-CM | POA: Diagnosis not present

## 2016-04-02 DIAGNOSIS — R0602 Shortness of breath: Secondary | ICD-10-CM | POA: Diagnosis not present

## 2016-04-02 DIAGNOSIS — Z7901 Long term (current) use of anticoagulants: Secondary | ICD-10-CM | POA: Diagnosis not present

## 2016-04-02 DIAGNOSIS — Z5181 Encounter for therapeutic drug level monitoring: Secondary | ICD-10-CM | POA: Diagnosis not present

## 2016-04-02 DIAGNOSIS — J410 Simple chronic bronchitis: Secondary | ICD-10-CM | POA: Diagnosis not present

## 2016-04-02 DIAGNOSIS — R42 Dizziness and giddiness: Secondary | ICD-10-CM | POA: Diagnosis not present

## 2016-04-30 DIAGNOSIS — I482 Chronic atrial fibrillation: Secondary | ICD-10-CM | POA: Diagnosis not present

## 2016-05-27 DIAGNOSIS — I482 Chronic atrial fibrillation: Secondary | ICD-10-CM | POA: Diagnosis not present

## 2016-06-26 DIAGNOSIS — I482 Chronic atrial fibrillation: Secondary | ICD-10-CM | POA: Diagnosis not present

## 2016-07-01 DIAGNOSIS — I482 Chronic atrial fibrillation: Secondary | ICD-10-CM | POA: Diagnosis not present

## 2016-07-31 DIAGNOSIS — R0602 Shortness of breath: Secondary | ICD-10-CM | POA: Diagnosis not present

## 2016-07-31 DIAGNOSIS — R42 Dizziness and giddiness: Secondary | ICD-10-CM | POA: Diagnosis not present

## 2016-07-31 DIAGNOSIS — I10 Essential (primary) hypertension: Secondary | ICD-10-CM | POA: Diagnosis not present

## 2016-07-31 DIAGNOSIS — J410 Simple chronic bronchitis: Secondary | ICD-10-CM | POA: Diagnosis not present

## 2016-07-31 DIAGNOSIS — I482 Chronic atrial fibrillation: Secondary | ICD-10-CM | POA: Diagnosis not present

## 2016-08-07 DIAGNOSIS — J3489 Other specified disorders of nose and nasal sinuses: Secondary | ICD-10-CM | POA: Diagnosis not present

## 2016-08-07 DIAGNOSIS — I482 Chronic atrial fibrillation: Secondary | ICD-10-CM | POA: Diagnosis not present

## 2016-08-07 DIAGNOSIS — R0602 Shortness of breath: Secondary | ICD-10-CM | POA: Diagnosis not present

## 2016-08-07 DIAGNOSIS — R05 Cough: Secondary | ICD-10-CM | POA: Diagnosis not present

## 2016-08-07 DIAGNOSIS — I10 Essential (primary) hypertension: Secondary | ICD-10-CM | POA: Diagnosis not present

## 2016-08-07 DIAGNOSIS — Z950 Presence of cardiac pacemaker: Secondary | ICD-10-CM | POA: Diagnosis not present

## 2016-08-07 DIAGNOSIS — Z0000 Encounter for general adult medical examination without abnormal findings: Secondary | ICD-10-CM | POA: Diagnosis not present

## 2016-08-07 DIAGNOSIS — I480 Paroxysmal atrial fibrillation: Secondary | ICD-10-CM | POA: Diagnosis not present

## 2016-08-07 DIAGNOSIS — J410 Simple chronic bronchitis: Secondary | ICD-10-CM | POA: Diagnosis not present

## 2016-08-07 DIAGNOSIS — Z1231 Encounter for screening mammogram for malignant neoplasm of breast: Secondary | ICD-10-CM | POA: Diagnosis not present

## 2016-08-18 DIAGNOSIS — R0602 Shortness of breath: Secondary | ICD-10-CM | POA: Diagnosis not present

## 2016-08-25 ENCOUNTER — Other Ambulatory Visit: Payer: Self-pay | Admitting: Internal Medicine

## 2016-08-25 DIAGNOSIS — Z1231 Encounter for screening mammogram for malignant neoplasm of breast: Secondary | ICD-10-CM

## 2016-08-28 DIAGNOSIS — I482 Chronic atrial fibrillation: Secondary | ICD-10-CM | POA: Diagnosis not present

## 2016-09-30 ENCOUNTER — Encounter: Payer: PPO | Attending: Internal Medicine | Admitting: Respiratory Therapy

## 2016-09-30 VITALS — Ht 62.5 in | Wt 184.4 lb

## 2016-09-30 DIAGNOSIS — J449 Chronic obstructive pulmonary disease, unspecified: Secondary | ICD-10-CM | POA: Diagnosis not present

## 2016-09-30 DIAGNOSIS — I482 Chronic atrial fibrillation: Secondary | ICD-10-CM | POA: Diagnosis not present

## 2016-09-30 NOTE — Patient Instructions (Signed)
Patient Instructions  Patient Details  Name: PATRESE BRUTTO MRN: [id number] Date of Birth: 1927-06-25 Referring Provider:  Tracie Harrier, MD  Below are the personal goals you chose as well as exercise and nutrition goals. Our goal is to help you keep on track towards obtaining and maintaining your goals. We will be discussing your progress on these goals with you throughout the program.  Initial Exercise Prescription:     Initial Exercise Prescription - 09/30/16 1300      Date of Initial Exercise RX and Referring Provider   Date 09/30/16     Treadmill   MPH 1.4   Minutes 15   METs 2.07     NuStep   Level 2   Minutes 15   METs 2     T5 Nustep   Level 1   Minutes 15   METs 2     Biostep-RELP   Level 2   Minutes 15   METs 2     Prescription Details   Frequency (times per week) 3   Duration Progress to 45 minutes of aerobic exercise without signs/symptoms of physical distress     Intensity   THRR 40-80% of Max Heartrate 89-118   Ratings of Perceived Exertion 11-13   Perceived Dyspnea 0-4     Progression   Progression Continue to progress workloads to maintain intensity without signs/symptoms of physical distress.     Resistance Training   Training Prescription Yes   Weight 2   Reps 10-15      Exercise Goals: Frequency: Be able to perform aerobic exercise three times per week working toward 3-5 days per week.  Intensity: Work with a perceived exertion of 11 (fairly light) - 15 (hard) as tolerated. Follow your new exercise prescription and watch for changes in prescription as you progress with the program. Changes will be reviewed with you when they are made.  Duration: You should be able to do 30 minutes of continuous aerobic exercise in addition to a 5 minute warm-up and a 5 minute cool-down routine.  Nutrition Goals: Your personal nutrition goals will be established when you do your nutrition analysis with the dietician.  The following are nutrition  guidelines to follow: Cholesterol < 200mg /day Sodium < 1500mg /day Fiber: Women over 50 yrs - 21 grams per day  Personal Goals:     Personal Goals and Risk Factors at Admission - 09/30/16 1502      Core Components/Risk Factors/Patient Goals on Admission   Sedentary Yes  Goal: increase walking; walks her dog in the back yard around the fence line.   Intervention Provide advice, education, support and counseling about physical activity/exercise needs.;Develop an individualized exercise prescription for aerobic and resistive training based on initial evaluation findings, risk stratification, comorbidities and participant's personal goals.   Expected Outcomes Achievement of increased cardiorespiratory fitness and enhanced flexibility, muscular endurance and strength shown through measurements of functional capacity and personal statement of participant.   Increase Strength and Stamina Yes   Intervention Provide advice, education, support and counseling about physical activity/exercise needs.;Develop an individualized exercise prescription for aerobic and resistive training based on initial evaluation findings, risk stratification, comorbidities and participant's personal goals.   Expected Outcomes Achievement of increased cardiorespiratory fitness and enhanced flexibility, muscular endurance and strength shown through measurements of functional capacity and personal statement of participant.   Improve shortness of breath with ADL's Yes   Intervention Provide education, individualized exercise plan and daily activity instruction to help decrease symptoms of SOB with  activities of daily living.   Expected Outcomes Short Term: Achieves a reduction of symptoms when performing activities of daily living.   Develop more efficient breathing techniques such as purse lipped breathing and diaphragmatic breathing; and practicing self-pacing with activity Yes   Intervention Provide education, demonstration and  support about specific breathing techniuqes utilized for more efficient breathing. Include techniques such as pursed lipped breathing, diaphragmatic breathing and self-pacing activity.   Expected Outcomes Short Term: Participant will be able to demonstrate and use breathing techniques as needed throughout daily activities.   Increase knowledge of respiratory medications and ability to use respiratory devices properly  Yes  Symbicort, Spiriva, Proventil, Spacer given; Duoneb   Intervention Provide education and demonstration as needed of appropriate use of medications, inhalers, and oxygen therapy.   Expected Outcomes Short Term: Achieves understanding of medications use. Understands that oxygen is a medication prescribed by physician. Demonstrates appropriate use of inhaler and oxygen therapy.   Hypertension Yes   Intervention Provide education on lifestyle modifcations including regular physical activity/exercise, weight management, moderate sodium restriction and increased consumption of fresh fruit, vegetables, and low fat dairy, alcohol moderation, and smoking cessation.;Monitor prescription use compliance.   Expected Outcomes Short Term: Continued assessment and intervention until BP is < 140/68mm HG in hypertensive participants. < 130/86mm HG in hypertensive participants with diabetes, heart failure or chronic kidney disease.;Long Term: Maintenance of blood pressure at goal levels.   Lipids Yes   Intervention Provide education and support for participant on nutrition & aerobic/resistive exercise along with prescribed medications to achieve LDL 70mg , HDL >40mg .   Expected Outcomes Short Term: Participant states understanding of desired cholesterol values and is compliant with medications prescribed. Participant is following exercise prescription and nutrition guidelines.;Long Term: Cholesterol controlled with medications as prescribed, with individualized exercise RX and with personalized nutrition  plan. Value goals: LDL < 70mg , HDL > 40 mg.      Tobacco Use Initial Evaluation: History  Smoking Status   Former Smoker   Quit date: 12/05/1986  Smokeless Tobacco   Never Used    Copy of goals given to participant.

## 2016-09-30 NOTE — Progress Notes (Signed)
Pulmonary Individual Treatment Plan  Patient Details  Name: [name] [name] MRN: [id number] Date of Birth: 11/16/1926 Referring Provider:    Initial Encounter Date:  Flowsheet Row Pulmonary Rehab from 09/30/2016 in [facility] Cardiac and Pulmonary Rehab  Date  09/30/16      Visit Diagnosis: Chronic obstructive pulmonary disease, unspecified COPD type ([place])  Patient's Home Medications on Admission:  Current Outpatient Prescriptions:    albuterol (PROVENTIL HFA;VENTOLIN HFA) 108 (90 BASE) MCG/ACT inhaler, Inhale 2 puffs into the lungs every 6 (six) hours as needed for wheezing or shortness of breath., Disp: , Rfl:    Albuterol Sulfate (PROAIR HFA IN), Inhale into the lungs., Disp: , Rfl:    amLODipine (NORVASC) 2.5 MG tablet, Take 2.5 mg by mouth daily., Disp: , Rfl:    aspirin 325 MG EC tablet, Take 325 mg by mouth daily., Disp: , Rfl:    atorvastatin (LIPITOR) 10 MG tablet, Take 10 mg by mouth daily., Disp: , Rfl:    carvedilol (COREG) 25 MG tablet, Take 25 mg by mouth 2 (two) times daily with a meal., Disp: , Rfl:    cloNIDine (CATAPRES) 0.2 MG tablet, Take 0.2 mg by mouth 2 (two) times daily., Disp: , Rfl:    clopidogrel (PLAVIX) 75 MG tablet, Take 75 mg by mouth daily., Disp: , Rfl:    ezetimibe (ZETIA) 10 MG tablet, Take 10 mg by mouth daily., Disp: , Rfl:    furosemide (LASIX) 40 MG tablet, Take 20 mg by mouth. , Disp: , Rfl:    metoprolol succinate (TOPROL-XL) 100 MG 24 hr tablet, Take 100 mg by mouth daily. Take with or immediately following a meal., Disp: , Rfl:    montelukast (SINGULAIR) 10 MG tablet, Take 10 mg by mouth at bedtime., Disp: , Rfl:    olmesartan-hydrochlorothiazide (BENICAR HCT) 40-25 MG per tablet, Take 1 tablet by mouth daily., Disp: , Rfl:    omeprazole (PRILOSEC) 40 MG capsule, Take 40 mg by mouth daily., Disp: , Rfl:    quinapril (ACCUPRIL) 40 MG tablet, Take 40 mg by mouth daily., Disp: , Rfl:    sildenafil (VIAGRA) 100 MG tablet, Take 100 mg by  mouth daily as needed for erectile dysfunction., Disp: , Rfl:    Umeclidinium-Vilanterol 62.5-25 MCG/INH AEPB, Inhale into the lungs., Disp: , Rfl:    vardenafil (LEVITRA) 20 MG tablet, Take 20 mg by mouth daily as needed for erectile dysfunction., Disp: , Rfl:   Past Medical History: Past Medical History:  Diagnosis Date   Breast cancer ([place])    CAD (coronary artery disease)    DVT (deep venous thrombosis) (HCC)    Hemorrhoids    History of knee replacement, total    Hypertension    Ischemic cardiomyopathy    Peripheral arterial disease (HCC)    Seizure disorder ([facility])     Tobacco Use: History  Smoking Status   Former Smoker   Quit date: 12/05/1986  Smokeless Tobacco   Never Used    Labs: Recent Review Flowsheet Data    There is no flowsheet data to display.       ADL UCSD:     Pulmonary Assessment Scores    Row Name 09/30/16 1459         ADL UCSD   ADL Phase Entry     SOB Score total 62     Rest 0     Walk 4     Stairs 4     Bath 2  Dress 3     Shop 3        Pulmonary Function Assessment:     Pulmonary Function Assessment - 09/30/16 1458      Pulmonary Function Tests   FVC% 91 %   FEV1% 96 %   FEV1/FVC Ratio 71   RV% 75 %   DLCO% 67 %     Breath   Bilateral Breath Sounds Decreased;Clear   Shortness of Breath Yes;Limiting activity      Exercise Target Goals: Date: 09/30/16  Exercise Program Goal: Individual exercise prescription set with THRR, safety & activity barriers. Participant demonstrates ability to understand and report RPE using BORG scale, to self-measure pulse accurately, and to acknowledge the importance of the exercise prescription.  Exercise Prescription Goal: Starting with aerobic activity 30 plus minutes a day, 3 days per week for initial exercise prescription. Provide home exercise prescription and guidelines that participant acknowledges understanding prior to discharge.  Activity Barriers & Risk  Stratification:     Activity Barriers & Cardiac Risk Stratification - 09/30/16 1457      Activity Barriers & Cardiac Risk Stratification   Activity Barriers Arthritis;Joint Problems;Shortness of Breath;Deconditioning;Muscular Weakness;History of Falls      6 Minute Walk:     6 Minute Walk    Row Name 09/30/16 1356         6 Minute Walk   Distance 845 feet     Walk Time 6 minutes     # of Rest Breaks 0     MPH 1.6     METS 2.23     RPE 13     Perceived Dyspnea  3     VO2 Peak 3.16     Symptoms No     Resting HR 60 bpm     Resting BP 114/50     Max Ex. HR 95 bpm     Max Ex. BP 152/56       Interval HR   Baseline HR 60     1 Minute HR 94     2 Minute HR 93     3 Minute HR 95     4 Minute HR 95     5 Minute HR 95     6 Minute HR 95     Interval Heart Rate? Yes       Interval Oxygen   Interval Oxygen? Yes     Baseline Oxygen Saturation % 97 %     1 Minute Oxygen Saturation % 95 %     2 Minute Oxygen Saturation % 96 %     3 Minute Oxygen Saturation % 96 %     4 Minute Oxygen Saturation % 96 %     5 Minute Oxygen Saturation % 96 %     6 Minute Oxygen Saturation % 96 %        Initial Exercise Prescription:     Initial Exercise Prescription - 09/30/16 1300      Date of Initial Exercise RX and Referring Provider   Date 09/30/16     Treadmill   MPH 1.4   Minutes 15   METs 2.07     NuStep   Level 2   Minutes 15   METs 2     T5 Nustep   Level 1   Minutes 15   METs 2     Biostep-RELP   Level 2   Minutes 15   METs 2     Prescription Details  Frequency (times per week) 3   Duration Progress to 45 minutes of aerobic exercise without signs/symptoms of physical distress     Intensity   THRR 40-80% of Max Heartrate 89-118   Ratings of Perceived Exertion 11-13   Perceived Dyspnea 0-4     Progression   Progression Continue to progress workloads to maintain intensity without signs/symptoms of physical distress.     Resistance Training    Training Prescription Yes   Weight 2   Reps 10-15      Perform Capillary Blood Glucose checks as needed.  Exercise Prescription Changes:   Exercise Comments:   Discharge Exercise Prescription (Final Exercise Prescription Changes):    Nutrition:  Target Goals: Understanding of nutrition guidelines, daily intake of sodium 1500mg , cholesterol 200mg , calories 30% from fat and 7% or less from saturated fats, daily to have 5 or more servings of fruits and vegetables.  Biometrics:     Pre Biometrics - 09/30/16 1356      Pre Biometrics   Height 5' 2.5" (1.588 m)   Weight 184 lb 6.4 oz (83.6 kg)   Waist Circumference 38.5 inches   Hip Circumference 48.25 inches   Waist to Hip Ratio 0.8 %   BMI (Calculated) 33.3       Nutrition Therapy Plan and Nutrition Goals:   Nutrition Discharge: Rate Your Plate Scores:   Psychosocial: Target Goals: Acknowledge presence or absence of depression, maximize coping skills, provide positive support system. Participant is able to verbalize types and ability to use techniques and skills needed for reducing stress and depression.  Initial Review & Psychosocial Screening:     Initial Psych Review & Screening - 09/30/16 [place]? Yes   Comments Ms Kratky has good support from her children; 2 children live close and can help if Ms Estudillo needs assistence. She states she is very independent and likes taking care of things herself. She is looking forward to [facility] and increasing her walking ability. Since she has a copay, she may switch to [employer] and use her [employer].     Barriers   Psychosocial barriers to participate in program The patient should benefit from training in stress management and relaxation.     Screening Interventions   Interventions Encouraged to exercise;Program counselor consult      Quality of Life Scores:   PHQ-9: Recent Review Flowsheet Data    Depression  screen [facility] 2/9 09/30/2016   Decreased Interest 1   Down, Depressed, Hopeless 0   PHQ - 2 Score 1   Altered sleeping 3   Tired, decreased energy 3   Change in appetite 1   Feeling bad or failure about yourself  0   Trouble concentrating 1   Moving slowly or fidgety/restless 0   Suicidal thoughts 0   PHQ-9 Score 9   Difficult doing work/chores Very difficult      Psychosocial Evaluation and Intervention:   Psychosocial Re-Evaluation:  Education: Education Goals: Education classes will be provided on a weekly basis, covering required topics. Participant will state understanding/return demonstration of topics presented.  Learning Barriers/Preferences:     Learning Barriers/Preferences - 09/30/16 1458      Learning Barriers/Preferences   Learning Barriers None   Learning Preferences None      Education Topics: Initial Evaluation Education: - Verbal, written and demonstration of respiratory meds, RPE/PD scales, oximetry and breathing techniques. Instruction on use of nebulizers and MDIs: cleaning  and proper use, rinsing mouth with steroid doses and importance of monitoring MDI activations. Flowsheet Row Pulmonary Rehab from 09/30/2016 in [facility] Cardiac and Pulmonary Rehab  Date  09/30/16  Educator  LB  Instruction Review Code  2- meets goals/outcomes      General Nutrition Guidelines/Fats and Fiber: -Group instruction provided by verbal, written material, models and posters to present the general guidelines for heart healthy nutrition. Gives an explanation and review of dietary fats and fiber.   Controlling Sodium/Reading Food Labels: -Group verbal and written material supporting the discussion of sodium use in heart healthy nutrition. Review and explanation with models, verbal and written materials for utilization of the food label.   Exercise Physiology & Risk Factors: - Group verbal and written instruction with models to review the exercise physiology of the  cardiovascular system and associated critical values. Details cardiovascular disease risk factors and the goals associated with each risk factor.   Aerobic Exercise & Resistance Training: - Gives group verbal and written discussion on the health impact of inactivity. On the components of aerobic and resistive training programs and the benefits of this training and how to safely progress through these programs.   Flexibility, Balance, General Exercise Guidelines: - Provides group verbal and written instruction on the benefits of flexibility and balance training programs. Provides general exercise guidelines with specific guidelines to those with heart or lung disease. Demonstration and skill practice provided.   Stress Management: - Provides group verbal and written instruction about the health risks of elevated stress, cause of high stress, and healthy ways to reduce stress.   Depression: - Provides group verbal and written instruction on the correlation between heart/lung disease and depressed mood, treatment options, and the stigmas associated with seeking treatment.   Exercise & Equipment Safety: - Individual verbal instruction and demonstration of equipment use and safety with use of the equipment.   Infection Prevention: - Provides verbal and written material to individual with discussion of infection control including proper hand washing and proper equipment cleaning during exercise session. Flowsheet Row Pulmonary Rehab from 09/30/2016 in [facility] Cardiac and Pulmonary Rehab  Date  09/30/16  Educator  LB  Instruction Review Code  2- meets goals/outcomes      Falls Prevention: - Provides verbal and written material to individual with discussion of falls prevention and safety. Flowsheet Row Pulmonary Rehab from 09/30/2016 in [facility] Cardiac and Pulmonary Rehab  Date  09/30/16  Educator  LB  Instruction Review Code  2- meets goals/outcomes      Diabetes: - Individual verbal and  written instruction to review signs/symptoms of diabetes, desired ranges of glucose level fasting, after meals and with exercise. Advice that pre and post exercise glucose checks will be done for 3 sessions at entry of program.   Chronic Lung Diseases: - Group verbal and written instruction to review new updates, new respiratory medications, new advancements in procedures and treatments. Provide informative websites and "800" numbers of self-education.   Lung Procedures: - Group verbal and written instruction to describe testing methods done to diagnose lung disease. Review the outcome of test results. Describe the treatment choices: Pulmonary Function Tests, ABGs and oximetry.   Energy Conservation: - Provide group verbal and written instruction for methods to conserve energy, plan and organize activities. Instruct on pacing techniques, use of adaptive equipment and posture/positioning to relieve shortness of breath.   Triggers: - Group verbal and written instruction to review types of environmental controls: home humidity, furnaces, filters, dust mite/pet prevention, HEPA  vacuums. To discuss weather changes, air quality and the benefits of nasal washing.   Exacerbations: - Group verbal and written instruction to provide: warning signs, infection symptoms, calling MD promptly, preventive modes, and value of vaccinations. Review: effective airway clearance, coughing and/or vibration techniques. Create an Sports administrator.   Oxygen: - Individual and group verbal and written instruction on oxygen therapy. Includes supplement oxygen, available portable oxygen systems, continuous and intermittent flow rates, oxygen safety, concentrators, and Medicare reimbursement for oxygen.   Respiratory Medications: - Group verbal and written instruction to review medications for lung disease. Drug class, frequency, complications, importance of spacers, rinsing mouth after steroid MDI's, and proper cleaning  methods for nebulizers. Flowsheet Row Pulmonary Rehab from 09/30/2016 in [facility] Cardiac and Pulmonary Rehab  Date  09/30/16  Educator  LB  Instruction Review Code  2- meets goals/outcomes      AED/CPR: - Group verbal and written instruction with the use of models to demonstrate the basic use of the AED with the basic ABC's of resuscitation.   Breathing Retraining: - Provides individuals verbal and written instruction on purpose, frequency, and proper technique of diaphragmatic breathing and pursed-lipped breathing. Applies individual practice skills. Flowsheet Row Pulmonary Rehab from 09/30/2016 in [facility] Cardiac and Pulmonary Rehab  Date  09/30/16  Educator  LB  Instruction Review Code  2- meets goals/outcomes      Anatomy and Physiology of the Lungs: - Group verbal and written instruction with the use of models to provide basic lung anatomy and physiology related to function, structure and complications of lung disease.   Heart Failure: - Group verbal and written instruction on the basics of heart failure: signs/symptoms, treatments, explanation of ejection fraction, enlarged heart and cardiomyopathy.   Sleep Apnea: - Individual verbal and written instruction to review Obstructive Sleep Apnea. Review of risk factors, methods for diagnosing and types of masks and machines for OSA.   Anxiety: - Provides group, verbal and written instruction on the correlation between heart/lung disease and anxiety, treatment options, and management of anxiety.   Relaxation: - Provides group, verbal and written instruction about the benefits of relaxation for patients with heart/lung disease. Also provides patients with examples of relaxation techniques.   Knowledge Questionnaire Score:     Knowledge Questionnaire Score - 09/30/16 1458      Knowledge Questionnaire Score   Pre Score 5/10       Core Components/Risk Factors/Patient Goals at Admission:     Personal Goals and Risk Factors at  Admission - 09/30/16 1502      Core Components/Risk Factors/Patient Goals on Admission   Sedentary Yes  Goal: increase walking; walks her dog in the back yard around the fence line.   Intervention Provide advice, education, support and counseling about physical activity/exercise needs.;Develop an individualized exercise prescription for aerobic and resistive training based on initial evaluation findings, risk stratification, comorbidities and participant's personal goals.   Expected Outcomes Achievement of increased cardiorespiratory fitness and enhanced flexibility, muscular endurance and strength shown through measurements of functional capacity and personal statement of participant.   Increase Strength and Stamina Yes   Intervention Provide advice, education, support and counseling about physical activity/exercise needs.;Develop an individualized exercise prescription for aerobic and resistive training based on initial evaluation findings, risk stratification, comorbidities and participant's personal goals.   Expected Outcomes Achievement of increased cardiorespiratory fitness and enhanced flexibility, muscular endurance and strength shown through measurements of functional capacity and personal statement of participant.   Improve shortness of breath with ADL's  Yes   Intervention Provide education, individualized exercise plan and daily activity instruction to help decrease symptoms of SOB with activities of daily living.   Expected Outcomes Short Term: Achieves a reduction of symptoms when performing activities of daily living.   Develop more efficient breathing techniques such as purse lipped breathing and diaphragmatic breathing; and practicing self-pacing with activity Yes   Intervention Provide education, demonstration and support about specific breathing techniuqes utilized for more efficient breathing. Include techniques such as pursed lipped breathing, diaphragmatic breathing and self-pacing  activity.   Expected Outcomes Short Term: Participant will be able to demonstrate and use breathing techniques as needed throughout daily activities.   Increase knowledge of respiratory medications and ability to use respiratory devices properly  Yes  Symbicort, Spiriva, Proventil, Spacer given; Duoneb   Intervention Provide education and demonstration as needed of appropriate use of medications, inhalers, and oxygen therapy.   Expected Outcomes Short Term: Achieves understanding of medications use. Understands that oxygen is a medication prescribed by physician. Demonstrates appropriate use of inhaler and oxygen therapy.   Hypertension Yes   Intervention Provide education on lifestyle modifcations including regular physical activity/exercise, weight management, moderate sodium restriction and increased consumption of fresh fruit, vegetables, and low fat dairy, alcohol moderation, and smoking cessation.;Monitor prescription use compliance.   Expected Outcomes Short Term: Continued assessment and intervention until BP is < 140/31mm HG in hypertensive participants. < 130/98mm HG in hypertensive participants with diabetes, heart failure or chronic kidney disease.;Long Term: Maintenance of blood pressure at goal levels.   Lipids Yes   Intervention Provide education and support for participant on nutrition & aerobic/resistive exercise along with prescribed medications to achieve LDL 70mg , HDL >40mg .   Expected Outcomes Short Term: Participant states understanding of desired cholesterol values and is compliant with medications prescribed. Participant is following exercise prescription and nutrition guidelines.;Long Term: Cholesterol controlled with medications as prescribed, with individualized exercise RX and with personalized nutrition plan. Value goals: LDL < 70mg , HDL > 40 mg.      Core Components/Risk Factors/Patient Goals Review:    Core Components/Risk Factors/Patient Goals at Discharge (Final  Review):    ITP Comments:   Comments: Ms Dykeman plans to start [facility] on 10/06/16 and attend 2-3 days/week. She does have a co-pay and has [employer], so Ms Hilby may switch to the [employer] after several weeks.

## 2016-10-01 ENCOUNTER — Ambulatory Visit
Admission: RE | Admit: 2016-10-01 | Discharge: 2016-10-01 | Disposition: A | Discharge status: Home / Self Care | Payer: PPO | Source: Ambulatory Visit | Attending: Internal Medicine | Admitting: Internal Medicine

## 2016-10-01 DIAGNOSIS — Z1231 Encounter for screening mammogram for malignant neoplasm of breast: Secondary | ICD-10-CM | POA: Insufficient documentation

## 2016-10-01 DIAGNOSIS — Z9012 Acquired absence of left breast and nipple: Secondary | ICD-10-CM | POA: Diagnosis not present

## 2016-10-06 DIAGNOSIS — J449 Chronic obstructive pulmonary disease, unspecified: Secondary | ICD-10-CM

## 2016-10-06 NOTE — Progress Notes (Signed)
Daily Session Note  Patient Details  Name: [name] [name] MRN: [id number] Date of Birth: [birth year] Referring Provider:    Encounter Date: 10/06/2016  Check In:     Session Check In - 10/06/16 1210      Check-In   Location [facility]-Cardiac & Pulmonary Rehab   Staff Present Earlean Shawl, BS, ACSM CEP, Exercise Physiologist;Laureen Owens Shark, BS, RRT, Respiratory Dareen Piano, BA, ACSM CEP, Exercise Physiologist   Supervising physician immediately available to respond to emergencies [facility] immediately available ER MD   Physician(s) Marcelene Butte and Jimmye Norman   Medication changes reported     No   Fall or balance concerns reported    No   Warm-up and Cool-down Performed as group-led instruction   Resistance Training Performed Yes   VAD Patient? No     Pain Assessment   Currently in Pain? No/denies           Exercise Prescription Changes - 10/06/16 1500      Response to Exercise   Blood Pressure (Admit) 110/64   Blood Pressure (Exit) 144/80   Heart Rate (Admit) 68 bpm   Heart Rate (Exercise) 92 bpm   Heart Rate (Exit) 59 bpm   Oxygen Saturation (Admit) 95 %   Oxygen Saturation (Exercise) 98 %   Oxygen Saturation (Exit) 99 %   Rating of Perceived Exertion (Exercise) 14   Perceived Dyspnea (Exercise) 3   Symptoms none   Duration Progress to 45 minutes of aerobic exercise without signs/symptoms of physical distress   Intensity THRR unchanged     Progression   Progression Continue to progress workloads to maintain intensity without signs/symptoms of physical distress.     Resistance Training   Training Prescription Yes   Weight 2   Reps 10-15     Treadmill   MPH 1.4   Minutes 15  3/3/3     Biostep-RELP   Level 2   Minutes 15   METs 2      Goals Met:  Proper associated with RPD/PD & O2 Sat Independence with exercise equipment Exercise tolerated well Strength training completed today  Goals Unmet:  Not Applicable  Comments: First full day of exercise!   Patient was oriented to gym and equipment including functions, settings, policies, and procedures.  Patient's individual exercise prescription and treatment plan were reviewed.  All starting workloads were established based on the results of the 6 minute walk test done at initial orientation visit.  The plan for exercise progression was also introduced and progression will be customized based on patient's performance and goals.    Dr. Emily Filbert is Medical Director for [facility] and [facility] Rehabilitation.

## 2016-10-07 NOTE — Progress Notes (Signed)
Pulmonary Individual Treatment Plan  Patient Details  Name: [name] [name] MRN: [id number] Date of Birth: 13-Aug-1927 Referring Provider:    Initial Encounter Date:  Flowsheet Row Pulmonary Rehab from 09/30/2016 in [facility] Cardiac and Pulmonary Rehab  Date  09/30/16      Visit Diagnosis: Chronic obstructive pulmonary disease, unspecified COPD type ([place])  Patient's Home Medications on Admission:  Current Outpatient Prescriptions:    albuterol (PROVENTIL HFA;VENTOLIN HFA) 108 (90 BASE) MCG/ACT inhaler, Inhale 2 puffs into the lungs every 6 (six) hours as needed for wheezing or shortness of breath., Disp: , Rfl:    Albuterol Sulfate (PROAIR HFA IN), Inhale into the lungs., Disp: , Rfl:    amLODipine (NORVASC) 2.5 MG tablet, Take 2.5 mg by mouth daily., Disp: , Rfl:    aspirin 325 MG EC tablet, Take 325 mg by mouth daily., Disp: , Rfl:    atorvastatin (LIPITOR) 10 MG tablet, Take 10 mg by mouth daily., Disp: , Rfl:    carvedilol (COREG) 25 MG tablet, Take 25 mg by mouth 2 (two) times daily with a meal., Disp: , Rfl:    cloNIDine (CATAPRES) 0.2 MG tablet, Take 0.2 mg by mouth 2 (two) times daily., Disp: , Rfl:    clopidogrel (PLAVIX) 75 MG tablet, Take 75 mg by mouth daily., Disp: , Rfl:    ezetimibe (ZETIA) 10 MG tablet, Take 10 mg by mouth daily., Disp: , Rfl:    furosemide (LASIX) 40 MG tablet, Take 20 mg by mouth. , Disp: , Rfl:    metoprolol succinate (TOPROL-XL) 100 MG 24 hr tablet, Take 100 mg by mouth daily. Take with or immediately following a meal., Disp: , Rfl:    montelukast (SINGULAIR) 10 MG tablet, Take 10 mg by mouth at bedtime., Disp: , Rfl:    olmesartan-hydrochlorothiazide (BENICAR HCT) 40-25 MG per tablet, Take 1 tablet by mouth daily., Disp: , Rfl:    omeprazole (PRILOSEC) 40 MG capsule, Take 40 mg by mouth daily., Disp: , Rfl:    quinapril (ACCUPRIL) 40 MG tablet, Take 40 mg by mouth daily., Disp: , Rfl:    sildenafil (VIAGRA) 100 MG tablet, Take 100 mg by  mouth daily as needed for erectile dysfunction., Disp: , Rfl:    Umeclidinium-Vilanterol 62.5-25 MCG/INH AEPB, Inhale into the lungs., Disp: , Rfl:    vardenafil (LEVITRA) 20 MG tablet, Take 20 mg by mouth daily as needed for erectile dysfunction., Disp: , Rfl:   Past Medical History: Past Medical History:  Diagnosis Date   Breast cancer ([place]) 1978   left   CAD (coronary artery disease)    DVT (deep venous thrombosis) (HCC)    Hemorrhoids    History of knee replacement, total    Hypertension    Ischemic cardiomyopathy    Peripheral arterial disease (HCC)    Seizure disorder ([place])     Tobacco Use: History  Smoking Status   Former Smoker   Quit date: 12/05/1986  Smokeless Tobacco   Never Used    Labs: Recent Review Flowsheet Data    There is no flowsheet data to display.       ADL UCSD:     Pulmonary Assessment Scores    Row Name 09/30/16 1459         ADL UCSD   ADL Phase Entry     SOB Score total 62     Rest 0     Walk 4     Stairs 4     Bath 2  Dress 3     Shop 3        Pulmonary Function Assessment:     Pulmonary Function Assessment - 09/30/16 1458      Pulmonary Function Tests   FVC% 91 %   FEV1% 96 %   FEV1/FVC Ratio 71   RV% 75 %   DLCO% 67 %     Breath   Bilateral Breath Sounds Decreased;Clear   Shortness of Breath Yes;Limiting activity      Exercise Target Goals:    Exercise Program Goal: Individual exercise prescription set with THRR, safety & activity barriers. Participant demonstrates ability to understand and report RPE using BORG scale, to self-measure pulse accurately, and to acknowledge the importance of the exercise prescription.  Exercise Prescription Goal: Starting with aerobic activity 30 plus minutes a day, 3 days per week for initial exercise prescription. Provide home exercise prescription and guidelines that participant acknowledges understanding prior to discharge.  Activity Barriers & Risk  Stratification:     Activity Barriers & Cardiac Risk Stratification - 09/30/16 1457      Activity Barriers & Cardiac Risk Stratification   Activity Barriers Arthritis;Joint Problems;Shortness of Breath;Deconditioning;Muscular Weakness;History of Falls      6 Minute Walk:     6 Minute Walk    Row Name 09/30/16 1356         6 Minute Walk   Distance 845 feet     Walk Time 6 minutes     # of Rest Breaks 0     MPH 1.6     METS 2.23     RPE 13     Perceived Dyspnea  3     VO2 Peak 3.16     Symptoms No     Resting HR 60 bpm     Resting BP 114/50     Max Ex. HR 95 bpm     Max Ex. BP 152/56       Interval HR   Baseline HR 60     1 Minute HR 94     2 Minute HR 93     3 Minute HR 95     4 Minute HR 95     5 Minute HR 95     6 Minute HR 95     Interval Heart Rate? Yes       Interval Oxygen   Interval Oxygen? Yes     Baseline Oxygen Saturation % 97 %     1 Minute Oxygen Saturation % 95 %     2 Minute Oxygen Saturation % 96 %     3 Minute Oxygen Saturation % 96 %     4 Minute Oxygen Saturation % 96 %     5 Minute Oxygen Saturation % 96 %     6 Minute Oxygen Saturation % 96 %        Initial Exercise Prescription:     Initial Exercise Prescription - 09/30/16 1300      Date of Initial Exercise RX and Referring Provider   Date 09/30/16     Treadmill   MPH 1.4   Minutes 15   METs 2.07     NuStep   Level 2   Minutes 15   METs 2     T5 Nustep   Level 1   Minutes 15   METs 2     Biostep-RELP   Level 2   Minutes 15   METs 2     Prescription Details  Frequency (times per week) 3   Duration Progress to 45 minutes of aerobic exercise without signs/symptoms of physical distress     Intensity   THRR 40-80% of Max Heartrate 89-118   Ratings of Perceived Exertion 11-13   Perceived Dyspnea 0-4     Progression   Progression Continue to progress workloads to maintain intensity without signs/symptoms of physical distress.     Resistance Training    Training Prescription Yes   Weight 2   Reps 10-15      Perform Capillary Blood Glucose checks as needed.  Exercise Prescription Changes:     Exercise Prescription Changes    Row Name 10/06/16 1500             Response to Exercise   Blood Pressure (Admit) 110/64       Blood Pressure (Exit) 144/80       Heart Rate (Admit) 68 bpm       Heart Rate (Exercise) 92 bpm       Heart Rate (Exit) 59 bpm       Oxygen Saturation (Admit) 95 %       Oxygen Saturation (Exercise) 98 %       Oxygen Saturation (Exit) 99 %       Rating of Perceived Exertion (Exercise) 14       Perceived Dyspnea (Exercise) 3       Symptoms none       Duration Progress to 45 minutes of aerobic exercise without signs/symptoms of physical distress       Intensity THRR unchanged         Progression   Progression Continue to progress workloads to maintain intensity without signs/symptoms of physical distress.         Resistance Training   Training Prescription Yes       Weight 2       Reps 10-15         Treadmill   MPH 1.4       Minutes 15  3/3/3         Biostep-RELP   Level 2       Minutes 15       METs 2          Exercise Comments:     Exercise Comments    Row Name 10/06/16 1508           Exercise Comments  First full day of exercise!  Patient was oriented to gym and equipment including functions, settings, policies, and procedures.  Patient's individual exercise prescription and treatment plan were reviewed.  All starting workloads were established based on the results of the 6 minute walk test done at initial orientation visit.  The plan for exercise progression was also introduced and progression will be customized based on patient's performance and goals.          Discharge Exercise Prescription (Final Exercise Prescription Changes):     Exercise Prescription Changes - 10/06/16 1500      Response to Exercise   Blood Pressure (Admit) 110/64   Blood Pressure (Exit) 144/80   Heart  Rate (Admit) 68 bpm   Heart Rate (Exercise) 92 bpm   Heart Rate (Exit) 59 bpm   Oxygen Saturation (Admit) 95 %   Oxygen Saturation (Exercise) 98 %   Oxygen Saturation (Exit) 99 %   Rating of Perceived Exertion (Exercise) 14   Perceived Dyspnea (Exercise) 3   Symptoms none   Duration Progress to 45 minutes of aerobic  exercise without signs/symptoms of physical distress   Intensity THRR unchanged     Progression   Progression Continue to progress workloads to maintain intensity without signs/symptoms of physical distress.     Resistance Training   Training Prescription Yes   Weight 2   Reps 10-15     Treadmill   MPH 1.4   Minutes 15  3/3/3     Biostep-RELP   Level 2   Minutes 15   METs 2       Nutrition:  Target Goals: Understanding of nutrition guidelines, daily intake of sodium <1562m, cholesterol <2066m calories 30% from fat and 7% or less from saturated fats, daily to have 5 or more servings of fruits and vegetables.  Biometrics:     Pre Biometrics - 09/30/16 1356      Pre Biometrics   Height 5' 2.5" (1.588 m)   Weight 184 lb 6.4 oz (83.6 kg)   Waist Circumference 38.5 inches   Hip Circumference 48.25 inches   Waist to Hip Ratio 0.8 %   BMI (Calculated) 33.3       Nutrition Therapy Plan and Nutrition Goals:   Nutrition Discharge: Rate Your Plate Scores:   Psychosocial: Target Goals: Acknowledge presence or absence of depression, maximize coping skills, provide positive support system. Participant is able to verbalize types and ability to use techniques and skills needed for reducing stress and depression.  Initial Review & Psychosocial Screening:     Initial Psych Review & Screening - 09/30/16 15BladesYes   Comments Ms BrColeyas good support from her children; 2 children live close and can help if Ms BrDickeeeds assistence. She states she is very independent and likes taking care of things herself.  She is looking forward to [facility] and increasing her walking ability. Since she has a copay, she may switch to Fo[employer]nd use her Sliver and Fit.     Barriers   Psychosocial barriers to participate in program The patient should benefit from training in stress management and relaxation.     Screening Interventions   Interventions Encouraged to exercise;Program counselor consult      Quality of Life Scores:   PHQ-9: Recent Review Flowsheet Data    [facility] 09/[facility]   Decreased Interest 1   Down, Depressed, Hopeless 0   PHQ - 2 Score 1   Altered sleeping 3   Tired, decreased energy 3   Change in appetite 1   Feeling bad or failure about yourself  0   Trouble concentrating 1   Moving slowly or fidgety/restless 0   Suicidal thoughts 0   PHQ-9 Score 9   Difficult doing work/chores Very difficult      Psychosocial Evaluation and Intervention:     Psychosocial Evaluation - 10/06/16 1235      Psychosocial Evaluation & Interventions   Interventions Encouraged to exercise with the program and follow exercise prescription   Comments Counselor met with [name] [name] for initial psychosocial evaluation.  She is an [age]ear old who has struggled with COPD for awhile now.  She lives alone but has (3) adult daughters who call and check on her everyday and [name] [name] also actively involved in her local church.  [name] [name] had multiple surgeries for shoulder, knee and hip replacements and has HBP as well.  She reports not sleeping well at all for a long time - and had a sleep  study years ago for this.  Counselor assessed caffeine intake with [name] [name] reporting drinks soft drinks up until 6PM at night.  Counselor encouraged decreasing the amount of soft drink intake and stop all caffeine priior to 3PM to help with getting to sleep better.  [name] [name] has a good appetite and denies a history or current symptoms of depression or anxiety.  She states she is typically in a positive mood and other  than her health and her finances, she has minimal stress in her life.  She has goals for losing a little weight and being able to do some of her normal household activities without shortness of breath.  She would also like to "get off some of this expensive medication."  Counselor encouarged [name] [name] to exercise consistently and see if it helps sleep in addition to the decreased caffeine intake.  Staff will be following with [name] [name] throughout the course of this program.        Psychosocial Re-Evaluation:  Education: Education Goals: Education classes will be provided on a weekly basis, covering required topics. Participant will state understanding/return demonstration of topics presented.  Learning Barriers/Preferences:     Learning Barriers/Preferences - 09/30/16 1458      Learning Barriers/Preferences   Learning Barriers None   Learning Preferences None      Education Topics: Initial Evaluation Education: - Verbal, written and demonstration of respiratory meds, RPE/PD scales, oximetry and breathing techniques. Instruction on use of nebulizers and MDIs: cleaning and proper use, rinsing mouth with steroid doses and importance of monitoring MDI activations. Flowsheet Row Pulmonary Rehab from 10/06/2016 in [facility] Cardiac and Pulmonary Rehab  Date  09/30/16  Educator  LB  Instruction Review Code  2- meets goals/outcomes      General Nutrition Guidelines/Fats and Fiber: -Group instruction provided by verbal, written material, models and posters to present the general guidelines for heart healthy nutrition. Gives an explanation and review of dietary fats and fiber. Flowsheet Row Pulmonary Rehab from 10/06/2016 in [facility] Cardiac and Pulmonary Rehab  Date  10/06/16  Educator  CR  Instruction Review Code  2- meets goals/outcomes      Controlling Sodium/Reading Food Labels: -Group verbal and written material supporting the discussion of sodium use in heart healthy nutrition. Review and explanation  with models, verbal and written materials for utilization of the food label.   Exercise Physiology & Risk Factors: - Group verbal and written instruction with models to review the exercise physiology of the cardiovascular system and associated critical values. Details cardiovascular disease risk factors and the goals associated with each risk factor.   Aerobic Exercise & Resistance Training: - Gives group verbal and written discussion on the health impact of inactivity. On the components of aerobic and resistive training programs and the benefits of this training and how to safely progress through these programs.   Flexibility, Balance, General Exercise Guidelines: - Provides group verbal and written instruction on the benefits of flexibility and balance training programs. Provides general exercise guidelines with specific guidelines to those with heart or lung disease. Demonstration and skill practice provided.   Stress Management: - Provides group verbal and written instruction about the health risks of elevated stress, cause of high stress, and healthy ways to reduce stress.   Depression: - Provides group verbal and written instruction on the correlation between heart/lung disease and depressed mood, treatment options, and the stigmas associated with seeking treatment.   Exercise & Equipment Safety: - Individual verbal instruction and demonstration of equipment use and safety  with use of the equipment. Flowsheet Row Pulmonary Rehab from 10/06/2016 in [facility] Cardiac and Pulmonary Rehab  Date  10/06/16  Educator  KH/AS  Instruction Review Code  2- meets goals/outcomes      Infection Prevention: - Provides verbal and written material to individual with discussion of infection control including proper hand washing and proper equipment cleaning during exercise session. Flowsheet Row Pulmonary Rehab from 10/06/2016 in [facility] Cardiac and Pulmonary Rehab  Date  10/06/16  Educator  KH/AS   Instruction Review Code  2- meets goals/outcomes      Falls Prevention: - Provides verbal and written material to individual with discussion of falls prevention and safety. Flowsheet Row Pulmonary Rehab from 10/06/2016 in [facility] Cardiac and Pulmonary Rehab  Date  09/30/16  Educator  LB  Instruction Review Code  2- meets goals/outcomes      Diabetes: - Individual verbal and written instruction to review signs/symptoms of diabetes, desired ranges of glucose level fasting, after meals and with exercise. Advice that pre and post exercise glucose checks will be done for 3 sessions at entry of program.   Chronic Lung Diseases: - Group verbal and written instruction to review new updates, new respiratory medications, new advancements in procedures and treatments. Provide informative websites and "800" numbers of self-education.   Lung Procedures: - Group verbal and written instruction to describe testing methods done to diagnose lung disease. Review the outcome of test results. Describe the treatment choices: Pulmonary Function Tests, ABGs and oximetry.   Energy Conservation: - Provide group verbal and written instruction for methods to conserve energy, plan and organize activities. Instruct on pacing techniques, use of adaptive equipment and posture/positioning to relieve shortness of breath.   Triggers: - Group verbal and written instruction to review types of environmental controls: home humidity, furnaces, filters, dust mite/pet prevention, HEPA vacuums. To discuss weather changes, air quality and the benefits of nasal washing.   Exacerbations: - Group verbal and written instruction to provide: warning signs, infection symptoms, calling MD promptly, preventive modes, and value of vaccinations. Review: effective airway clearance, coughing and/or vibration techniques. Create an Sports administrator.   Oxygen: - Individual and group verbal and written instruction on oxygen therapy. Includes  supplement oxygen, available portable oxygen systems, continuous and intermittent flow rates, oxygen safety, concentrators, and Medicare reimbursement for oxygen.   Respiratory Medications: - Group verbal and written instruction to review medications for lung disease. Drug class, frequency, complications, importance of spacers, rinsing mouth after steroid MDI's, and proper cleaning methods for nebulizers. Flowsheet Row Pulmonary Rehab from 10/06/2016 in [facility] Cardiac and Pulmonary Rehab  Date  09/30/16  Educator  LB  Instruction Review Code  2- meets goals/outcomes      AED/CPR: - Group verbal and written instruction with the use of models to demonstrate the basic use of the AED with the basic ABC's of resuscitation.   Breathing Retraining: - Provides individuals verbal and written instruction on purpose, frequency, and proper technique of diaphragmatic breathing and pursed-lipped breathing. Applies individual practice skills. Flowsheet Row Pulmonary Rehab from 10/06/2016 in [facility] Cardiac and Pulmonary Rehab  Date  09/30/16  Educator  LB  Instruction Review Code  2- meets goals/outcomes      Anatomy and Physiology of the Lungs: - Group verbal and written instruction with the use of models to provide basic lung anatomy and physiology related to function, structure and complications of lung disease.   Heart Failure: - Group verbal and written instruction on the basics of heart failure:  signs/symptoms, treatments, explanation of ejection fraction, enlarged heart and cardiomyopathy.   Sleep Apnea: - Individual verbal and written instruction to review Obstructive Sleep Apnea. Review of risk factors, methods for diagnosing and types of masks and machines for OSA.   Anxiety: - Provides group, verbal and written instruction on the correlation between heart/lung disease and anxiety, treatment options, and management of anxiety.   Relaxation: - Provides group, verbal and written  instruction about the benefits of relaxation for patients with heart/lung disease. Also provides patients with examples of relaxation techniques.   Knowledge Questionnaire Score:     Knowledge Questionnaire Score - 09/30/16 1458      Knowledge Questionnaire Score   Pre Score 5/10       Core Components/Risk Factors/Patient Goals at Admission:     Personal Goals and Risk Factors at Admission - 09/30/16 1502      Core Components/Risk Factors/Patient Goals on Admission   Sedentary Yes  Goal: increase walking; walks her dog in the back yard around the fence line.   Intervention Provide advice, education, support and counseling about physical activity/exercise needs.;Develop an individualized exercise prescription for aerobic and resistive training based on initial evaluation findings, risk stratification, comorbidities and participant's personal goals.   Expected Outcomes Achievement of increased cardiorespiratory fitness and enhanced flexibility, muscular endurance and strength shown through measurements of functional capacity and personal statement of participant.   Increase Strength and Stamina Yes   Intervention Provide advice, education, support and counseling about physical activity/exercise needs.;Develop an individualized exercise prescription for aerobic and resistive training based on initial evaluation findings, risk stratification, comorbidities and participant's personal goals.   Expected Outcomes Achievement of increased cardiorespiratory fitness and enhanced flexibility, muscular endurance and strength shown through measurements of functional capacity and personal statement of participant.   Improve shortness of breath with ADL's Yes   Intervention Provide education, individualized exercise plan and daily activity instruction to help decrease symptoms of SOB with activities of daily living.   Expected Outcomes Short Term: Achieves a reduction of symptoms when performing  activities of daily living.   Develop more efficient breathing techniques such as purse lipped breathing and diaphragmatic breathing; and practicing self-pacing with activity Yes   Intervention Provide education, demonstration and support about specific breathing techniuqes utilized for more efficient breathing. Include techniques such as pursed lipped breathing, diaphragmatic breathing and self-pacing activity.   Expected Outcomes Short Term: Participant will be able to demonstrate and use breathing techniques as needed throughout daily activities.   Increase knowledge of respiratory medications and ability to use respiratory devices properly  Yes  Symbicort, Spiriva, Proventil, Spacer given; Duoneb   Intervention Provide education and demonstration as needed of appropriate use of medications, inhalers, and oxygen therapy.   Expected Outcomes Short Term: Achieves understanding of medications use. Understands that oxygen is a medication prescribed by physician. Demonstrates appropriate use of inhaler and oxygen therapy.   Hypertension Yes   Intervention Provide education on lifestyle modifcations including regular physical activity/exercise, weight management, moderate sodium restriction and increased consumption of fresh fruit, vegetables, and low fat dairy, alcohol moderation, and smoking cessation.;Monitor prescription use compliance.   Expected Outcomes Short Term: Continued assessment and intervention until BP is < 140/65m HG in hypertensive participants. < 130/81mHG in hypertensive participants with diabetes, heart failure or chronic kidney disease.;Long Term: Maintenance of blood pressure at goal levels.   Lipids Yes   Intervention Provide education and support for participant on nutrition & aerobic/resistive exercise along with prescribed medications to  achieve LDL <69m, HDL >458m   Expected Outcomes Short Term: Participant states understanding of desired cholesterol values and is compliant  with medications prescribed. Participant is following exercise prescription and nutrition guidelines.;Long Term: Cholesterol controlled with medications as prescribed, with individualized exercise RX and with personalized nutrition plan. Value goals: LDL < 704mHDL > 40 mg.      Core Components/Risk Factors/Patient Goals Review:    Core Components/Risk Factors/Patient Goals at Discharge (Final Review):    ITP Comments:     ITP Comments    Row Name 09/30/16 1603           ITP Comments  Ms BraKesterans to start [facility] on 10/06/16 and attend 2-3 days/week. She does have a co-pay and has Silver and [employer] Ms BraBreedy switch to the [employer] several weeks.          Comments: First full day of exercise!  Patient was oriented to gym and equipment including functions, settings, policies, and procedures.  Patient's individual exercise prescription and treatment plan were reviewed.  All starting workloads were established based on the results of the 6 minute walk test done at initial orientation visit.  The plan for exercise progression was also introduced and progression will be customized based on patient's performance and goals.

## 2016-10-13 DIAGNOSIS — J449 Chronic obstructive pulmonary disease, unspecified: Secondary | ICD-10-CM

## 2016-10-13 NOTE — Progress Notes (Signed)
Daily Session Note  Patient Details  Name: [name] [name] MRN: [id number] Date of Birth: Jun 15, 1927 Referring Provider:    Encounter Date: 10/13/2016  Check In:     Session Check In - 10/13/16 1306      Check-In   Staff Present Carson Myrtle, BS, RRT, Respiratory Bertis Ruddy, BS, ACSM CEP, Exercise Physiologist;Henrine Hayter Oletta Darter, BA, ACSM CEP, Exercise Physiologist   Supervising physician immediately available to respond to emergencies [facility] immediately available ER MD   Physician(s) Mariea Clonts and Marcelene Butte   Medication changes reported     No   Fall or balance concerns reported    No   Warm-up and Cool-down Performed as group-led instruction   Resistance Training Performed Yes   VAD Patient? No     Pain Assessment   Currently in Pain? No/denies         Goals Met:  Proper associated with RPD/PD & O2 Sat Independence with exercise equipment Exercise tolerated well Strength training completed today  Goals Unmet:  Not Applicable  Comments: Pt able to follow exercise prescription today without complaint.  Will continue to monitor for progression.    Dr. Emily Filbert is Medical Director for [place] and [facility].

## 2016-10-15 ENCOUNTER — Encounter: Payer: PPO | Admitting: Respiratory Therapy

## 2016-10-15 DIAGNOSIS — J449 Chronic obstructive pulmonary disease, unspecified: Secondary | ICD-10-CM

## 2016-10-15 NOTE — Progress Notes (Signed)
Daily Session Note  Patient Details  Name: TRACINA BEAUMONT MRN: [id number] Date of Birth: [birth year] Referring Provider:    Encounter Date: 10/15/2016  Check In:     Session Check In - 10/15/16 1138      Check-In   Location [facility]   Staff Present Carson Myrtle, BS, RRT, Respiratory Lennie Hummer, MA, ACSM RCEP, Exercise Physiologist;Mysha Peeler RN BSN   Supervising physician immediately available to respond to emergencies [facility] immediately available ER MD   Physician(s) Drs. Malinda and Lord   Medication changes reported     No   Fall or balance concerns reported    No   Warm-up and Cool-down Performed as group-led Location manager Performed Yes   VAD Patient? No     Pain Assessment   Currently in Pain? No/denies         Goals Met:  Proper associated with RPD/PD & O2 Sat Using PLB without cueing & demonstrates good technique Exercise tolerated well No report of cardiac concerns or symptoms Strength training completed today  Goals Unmet:  Not Applicable  Comments: Pt able to follow exercise prescription today without complaint.  Will continue to monitor for progression.    Dr. Emily Filbert is Medical Director for [place] and [facility].

## 2016-10-17 ENCOUNTER — Encounter: Payer: PPO | Admitting: *Deleted

## 2016-10-17 DIAGNOSIS — J449 Chronic obstructive pulmonary disease, unspecified: Secondary | ICD-10-CM

## 2016-10-17 NOTE — Progress Notes (Addendum)
Daily Session Note  Patient Details  Name: [name] [name] MRN: [id number] Date of Birth: Aug 24, 1927 Referring Provider:    Encounter Date: 10/17/2016  Check In:     Session Check In - 10/17/16 1206      Check-In   Location [facility]   Staff Present Gerlene Burdock, RN, Levie Heritage, MA, ACSM RCEP, Exercise Physiologist;Patricia Surles RN BSN   Supervising physician immediately available to respond to emergencies [facility] immediately available ER MD   Physician(s) Dr. Kerman Passey and Dr. Quentin Cornwall   Medication changes reported     No   Fall or balance concerns reported    No   Warm-up and Cool-down Performed on first and last piece of equipment   Resistance Training Performed Yes   VAD Patient? No     Pain Assessment   Currently in Pain? No/denies         Goals Met:  Proper associated with RPD/PD & O2 Sat Independence with exercise equipment Using PLB without cueing & demonstrates good technique Exercise tolerated well Strength training completed today  Goals Unmet:  Not Applicable  Comments:   Pt able to follow exercise prescription today without complaint.  Will continue to monitor for progression. Reviewed home exercise with pt today.  Pt plans to walk and use bands at home for exercise.  Reviewed THR, pulse, RPE, sign and symptoms, and when to call 911 or MD.  Also discussed weather considerations and indoor options.  Pt voiced understanding. Alberteen Sam, MA, ACSM RCEP 10/17/2016 1:35 PM   Dr. Emily Filbert is Medical Director for [place] and [facility].

## 2016-10-20 DIAGNOSIS — J449 Chronic obstructive pulmonary disease, unspecified: Secondary | ICD-10-CM

## 2016-10-20 NOTE — Progress Notes (Signed)
Daily Session Note  Patient Details  Name: [name] [name] MRN: [id number] Date of Birth: [birth year] Referring Provider:    Encounter Date: 10/20/2016  Check In:     Session Check In - 10/20/16 1237      Check-In   Location [facility]   Staff Present Carson Myrtle, BS, RRT, Respiratory Therapist;Kelly Amedeo Plenty, BS, ACSM CEP, Exercise Physiologist;Amanda Oletta Darter, BA, ACSM CEP, Exercise Physiologist   Supervising physician immediately available to respond to emergencies [facility] immediately available ER MD   Physician(s) Corky Downs and Paduchowski   Medication changes reported     No   Fall or balance concerns reported    No   Warm-up and Cool-down Performed as group-led instruction   Resistance Training Performed Yes   VAD Patient? No     Pain Assessment   Currently in Pain? No/denies         Goals Met:  Proper associated with RPD/PD & O2 Sat Independence with exercise equipment Exercise tolerated well Strength training completed today  Goals Unmet:  Not Applicable  Comments: Pt able to follow exercise prescription today without complaint.  Will continue to monitor for progression.    Dr. Emily Filbert is Medical Director for [place] and [facility].

## 2016-10-22 ENCOUNTER — Encounter: Payer: PPO | Admitting: *Deleted

## 2016-10-22 DIAGNOSIS — J449 Chronic obstructive pulmonary disease, unspecified: Secondary | ICD-10-CM

## 2016-10-22 NOTE — Progress Notes (Signed)
Daily Session Note  Patient Details  Name: [name] [name] MRN: [id number] Date of Birth: 1926/11/17 Referring Provider:    Encounter Date: 10/22/2016  Check In:     Session Check In - 10/22/16 1221      Check-In   Location [facility]   Staff Present Carson Myrtle, BS, RRT, Respiratory Lennie Hummer, MA, ACSM RCEP, Exercise Physiologist;Takya Vandivier RN BSN   Supervising physician immediately available to respond to emergencies [facility] immediately available ER MD   Physician(s) Drs. Malinda and Williams   Medication changes reported     No   Fall or balance concerns reported    No   Warm-up and Cool-down Performed as group-led Location manager Performed Yes   VAD Patient? No     Pain Assessment   Currently in Pain? No/denies   Multiple Pain Sites No         Goals Met:  Proper associated with RPD/PD & O2 Sat Independence with exercise equipment Using PLB without cueing & demonstrates good technique Exercise tolerated well Strength training completed today  Goals Unmet:  Not Applicable  Comments: Pt able to follow exercise prescription today without complaint.  Will continue to monitor for progression.   Dr. Emily Filbert is Medical Director for [place] and [facility].

## 2016-10-24 ENCOUNTER — Encounter: Payer: PPO | Attending: Internal Medicine | Admitting: *Deleted

## 2016-10-24 DIAGNOSIS — J449 Chronic obstructive pulmonary disease, unspecified: Secondary | ICD-10-CM

## 2016-10-27 ENCOUNTER — Encounter: Payer: PPO | Admitting: *Deleted

## 2016-10-27 DIAGNOSIS — J449 Chronic obstructive pulmonary disease, unspecified: Secondary | ICD-10-CM | POA: Diagnosis not present

## 2016-10-27 NOTE — Progress Notes (Signed)
Daily Session Note  Patient Details  Name: [name] [name] MRN: [id number] Date of Birth: Mar 23, 1927 Referring Provider:    Encounter Date: 10/24/2016  Check In:      Goals Met:  Proper associated with RPD/PD & O2 Sat Independence with exercise equipment Using PLB without cueing & demonstrates good technique Strength training completed today  Goals Unmet:  Not Applicable  Comments:Pt able to follow exercise prescription today without complaint.  Will continue to monitor for progression.    Dr. Emily Filbert is Medical Director for [place] and [facility].

## 2016-10-27 NOTE — Progress Notes (Signed)
Daily Session Note  Patient Details  Name: [name] [name] MRN: [id number] Date of Birth: 11-Apr-1927 Referring Provider:    Encounter Date: 10/27/2016  Check In:     Session Check In - 10/27/16 1216      Check-In   Location [facility]   Staff Present Earlean Shawl, BS, ACSM CEP, Exercise Physiologist;Mary Kellie Shropshire, RN, BSN, [place], BS, RRT, Respiratory Therapist   Supervising physician immediately available to respond to emergencies [facility] immediately available ER MD   Physician(s) Corky Downs and Jimmye Norman   Medication changes reported     No   Fall or balance concerns reported    No   Warm-up and Cool-down Performed on first and last piece of equipment   Resistance Training Performed Yes   VAD Patient? No     Pain Assessment   Currently in Pain? No/denies   Multiple Pain Sites No         Goals Met:  Proper associated with RPD/PD & O2 Sat Independence with exercise equipment Exercise tolerated well Strength training completed today  Goals Unmet:  Not Applicable  Comments: Pt able to follow exercise prescription today without complaint.  Will continue to monitor for progression.    Dr. Emily Filbert is Medical Director for [place] and [facility].

## 2016-10-29 ENCOUNTER — Encounter: Payer: PPO | Admitting: *Deleted

## 2016-10-29 DIAGNOSIS — J449 Chronic obstructive pulmonary disease, unspecified: Secondary | ICD-10-CM

## 2016-10-29 NOTE — Progress Notes (Signed)
Daily Session Note  Patient Details  Name: ROCHELLE LARUE MRN: [id number] Date of Birth: 17-Oct-1926 Referring Provider:    Encounter Date: 10/29/2016  Check In:     Session Check In - 10/29/16 1144      Check-In   Location [facility]   Staff Present Alberteen Sam, MA, ACSM RCEP, Exercise Physiologist;Patricia Surles RN BSN;Laureen Owens Shark, BS, RRT, Respiratory Therapist   Supervising physician immediately available to respond to emergencies [facility] immediately available ER MD   Physician(s) Drs. Malinda and Robinson   Medication changes reported     No   Fall or balance concerns reported    No   Warm-up and Cool-down Performed as group-led Location manager Performed Yes   VAD Patient? No     Pain Assessment   Currently in Pain? No/denies   Multiple Pain Sites No         Goals Met:  Proper associated with RPD/PD & O2 Sat Independence with exercise equipment Using PLB without cueing & demonstrates good technique Exercise tolerated well Strength training completed today  Goals Unmet:  Not Applicable  Comments: Pt able to follow exercise prescription today without complaint.  Will continue to monitor for progression.    Dr. Emily Filbert is Medical Director for [place] and [facility].

## 2016-10-31 ENCOUNTER — Encounter: Payer: PPO | Admitting: *Deleted

## 2016-10-31 DIAGNOSIS — J449 Chronic obstructive pulmonary disease, unspecified: Secondary | ICD-10-CM

## 2016-10-31 DIAGNOSIS — R0602 Shortness of breath: Secondary | ICD-10-CM | POA: Diagnosis not present

## 2016-10-31 DIAGNOSIS — J410 Simple chronic bronchitis: Secondary | ICD-10-CM | POA: Diagnosis not present

## 2016-10-31 DIAGNOSIS — I10 Essential (primary) hypertension: Secondary | ICD-10-CM | POA: Diagnosis not present

## 2016-10-31 DIAGNOSIS — Z0000 Encounter for general adult medical examination without abnormal findings: Secondary | ICD-10-CM | POA: Diagnosis not present

## 2016-10-31 DIAGNOSIS — I482 Chronic atrial fibrillation: Secondary | ICD-10-CM | POA: Diagnosis not present

## 2016-10-31 DIAGNOSIS — I480 Paroxysmal atrial fibrillation: Secondary | ICD-10-CM | POA: Diagnosis not present

## 2016-10-31 DIAGNOSIS — Z950 Presence of cardiac pacemaker: Secondary | ICD-10-CM | POA: Diagnosis not present

## 2016-10-31 NOTE — Progress Notes (Signed)
Daily Session Note  Patient Details  Name: ELGENE CORAL MRN: [id number] Date of Birth: 1927/05/03 Referring Provider:    Encounter Date: 10/31/2016  Check In:     Session Check In - 10/31/16 1209      Check-In   Location [facility]   Staff Present Alberteen Sam, MA, ACSM RCEP, Exercise Physiologist   Supervising physician immediately available to respond to emergencies [facility] immediately available ER MD   Physician(s) Drs. Schaevitz & Williams   Medication changes reported     No   Fall or balance concerns reported    No   Warm-up and Cool-down Performed as group-led Location manager Performed Yes   VAD Patient? No     Pain Assessment   Currently in Pain? No/denies   Multiple Pain Sites No         Goals Met:  Proper associated with RPD/PD & O2 Sat Independence with exercise equipment Using PLB without cueing & demonstrates good technique Exercise tolerated well No report of cardiac concerns or symptoms Strength training completed today  Goals Unmet:  Not Applicable  Comments: Pt able to follow exercise prescription today without complaint.  Will continue to monitor for progression.    Dr. Emily Filbert is Medical Director for [place] and [facility].

## 2016-11-01 IMAGING — CT CT NECK W/ CM
3 of 5 series · 14 of 33 positions shown, 17 images · IV contrast (75CC OMNI 300)
Comparison: Chest CTA 03/21/2010.

CLINICAL DATA: 87-year-old female with sore throat, hoarseness for
4 months. Increase lymphatic tissue at the right base of tongue.
Initial encounter.

BUN and creatinine were obtained on site at [HOSPITAL] at
[HOSPITAL].Results: BUN fourteen mg/dL, Creatinine
mg/dL.
EXAM:
CT NECK WITH CONTRAST
TECHNIQUE: Multidetector CT imaging of the neck was performed using the
standard protocol following the bolus administration of intravenous
contrast.
CONTRAST:  75mL OMNIPAQUE IOHEXOL 300 MG/ML  SOLN

[Series 400: cor · coronal · 0.45mm/px · 3 of 113 slices shown]
[im 27/113  bone]
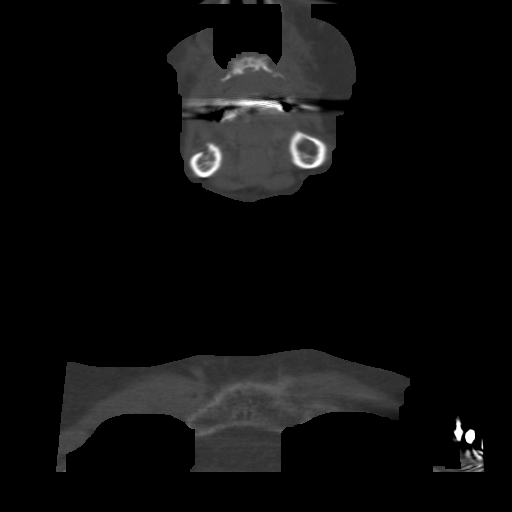
[im 47/113  bone]
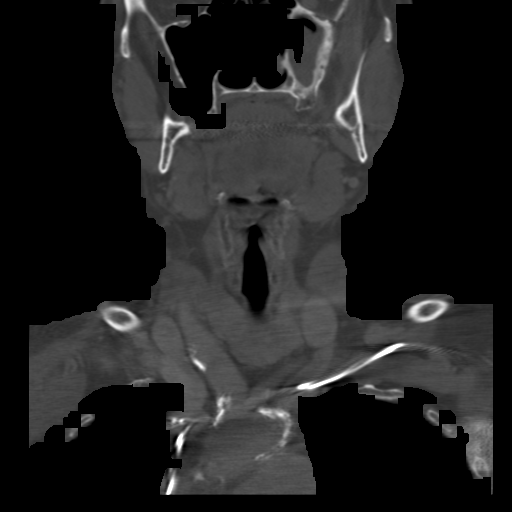
[im 67/113  bone]
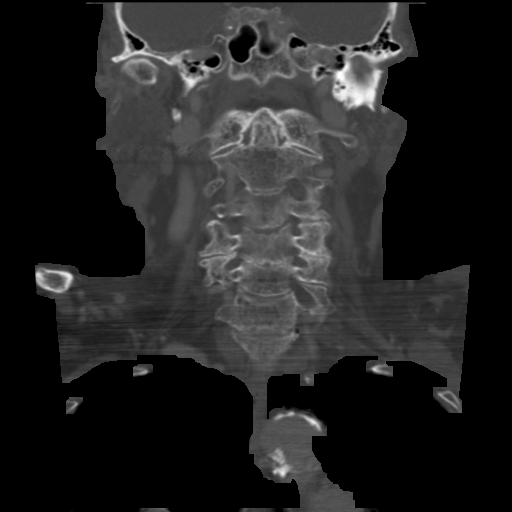

[Series 401: sag · sagittal · 0.45mm/px · 5 of 109 slices shown, 6 images]
[im 37/109  bone]
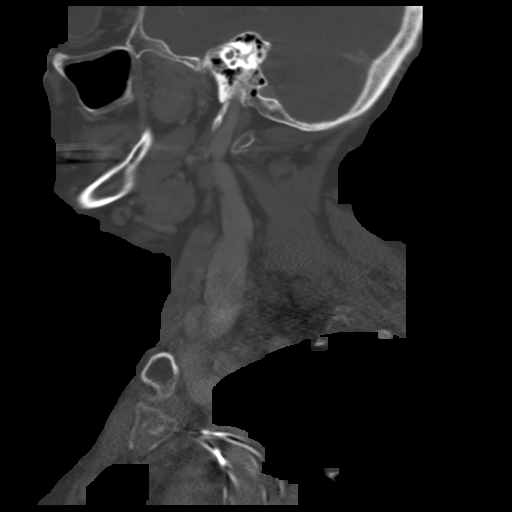
[im 46/109  bone]
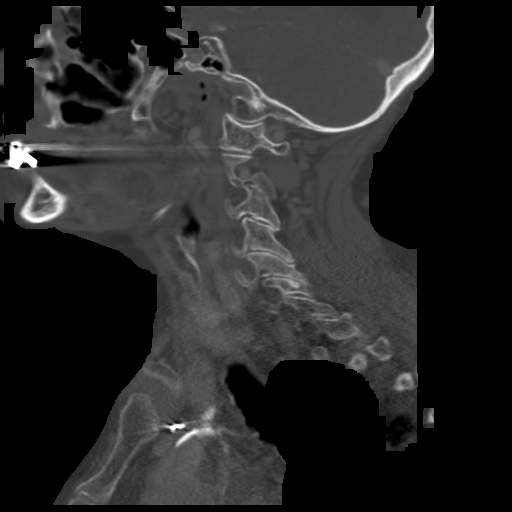
[im 55/109  soft-tissue]
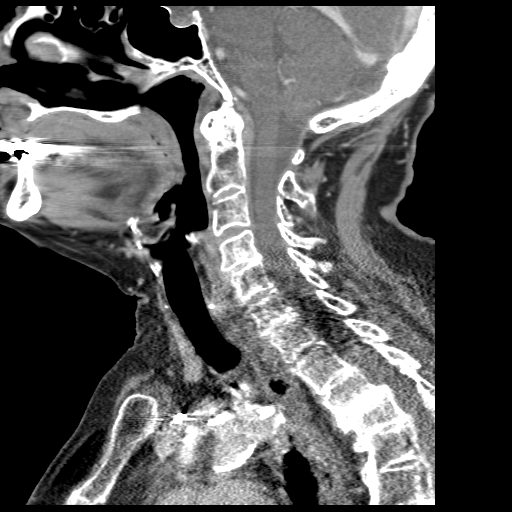
[im 55/109  bone]
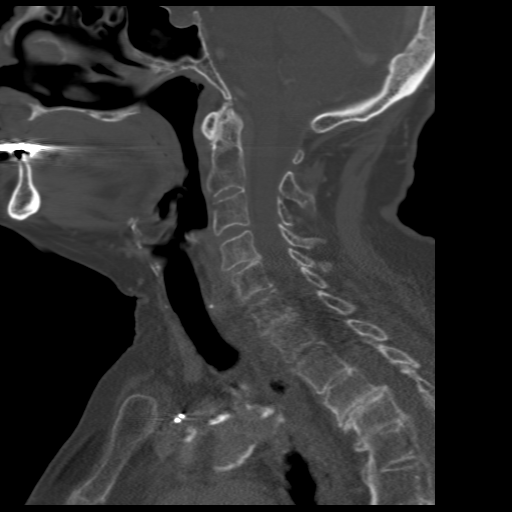
[im 64/109  bone]
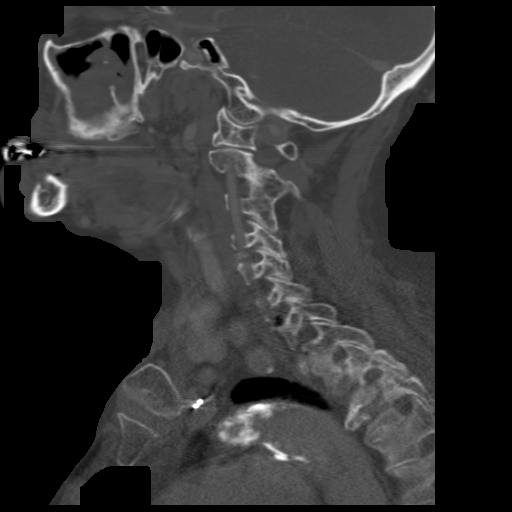
[im 73/109  bone]
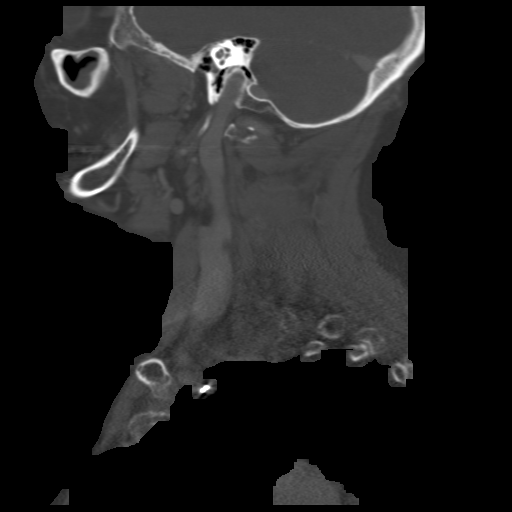

[Series 402: axial · axial · 0.45mm/px · z∈[-249,-73]mm · 6 of 130 slices shown, 8 images]
[im 19/130  soft-tissue]
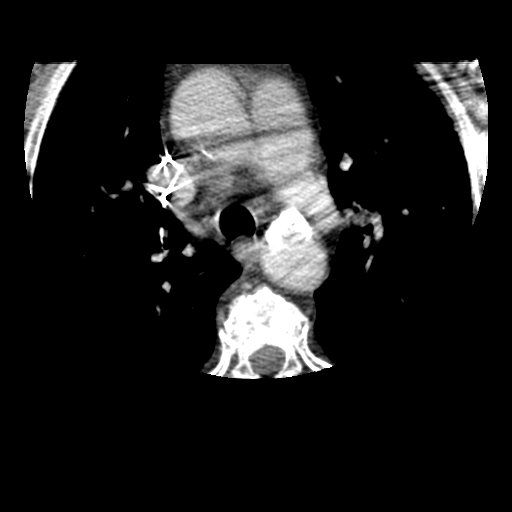
[im 19/130  bone]
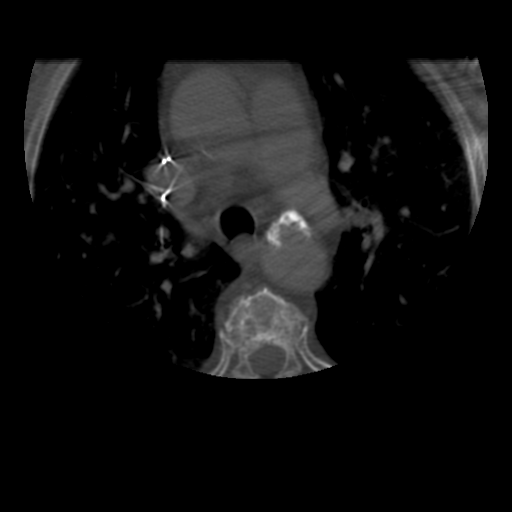
[im 37/130  bone]
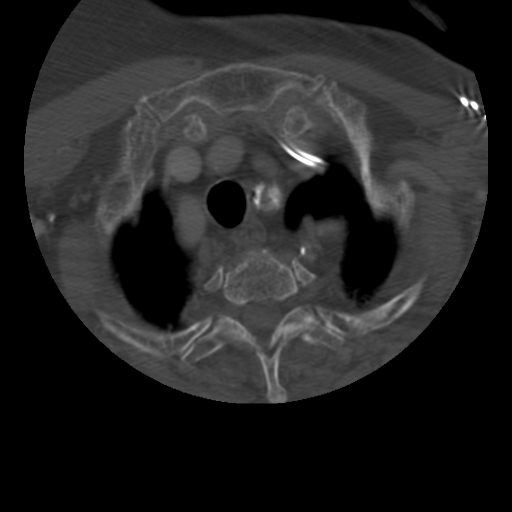
[im 56/130  bone]
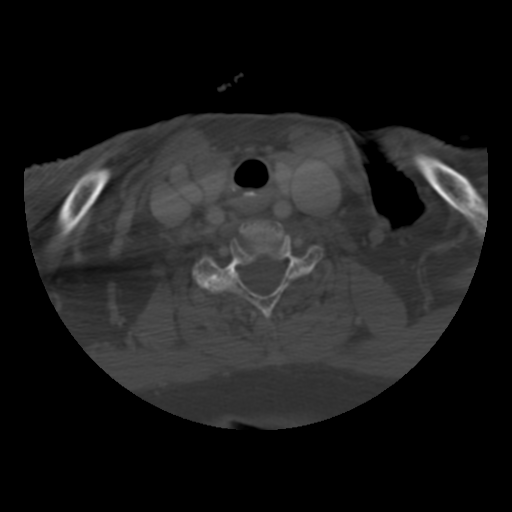
[im 74/130  bone]
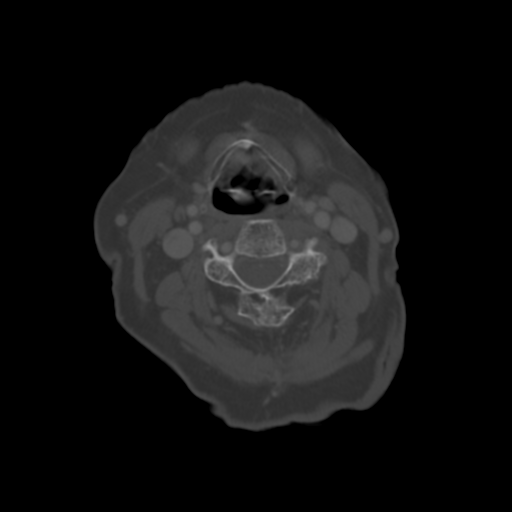
[im 93/130  soft-tissue]
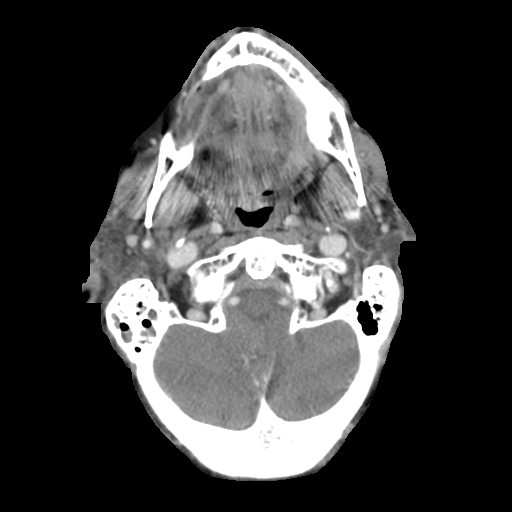
[im 93/130  bone]
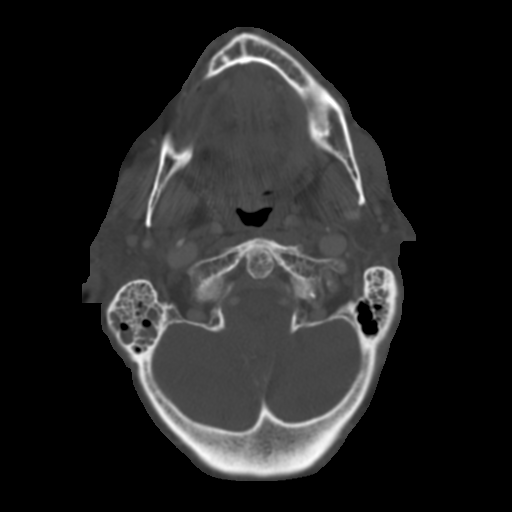
[im 111/130  bone]
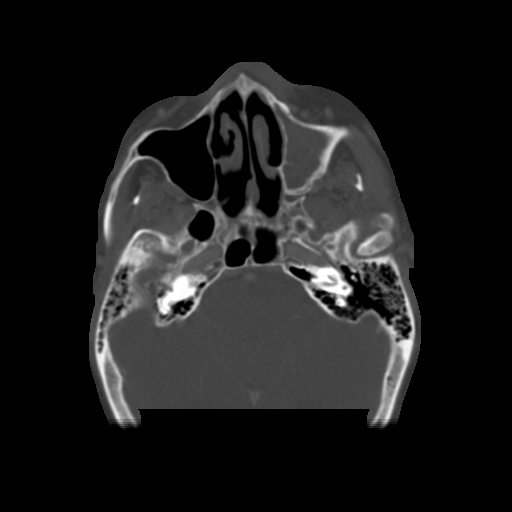

[14 of 33 positions shown; findings below may reference images not displayed]

FINDINGS: Pharynx and larynx: Motion artifact at the supraglottic larynx. The
level of the true vocal cords is normal. Epiglottis appears within
normal limits. No laryngeal mass identified. Hypopharynx within
normal limits.

Mild soft tissue asymmetry at the base the tongue is noted on series
2, image 37. This could simply reflect asymmetric lingual tonsil.

Oropharynx and nasopharynx contours within normal limits.
Parapharyngeal spaces, retropharyngeal space and sublingual space
within normal limits.

Salivary glands: Within normal limits.

Thyroid: Within normal limits.

Lymph nodes: Within normal limits throughout.

Vascular: Calcified atherosclerosis, severe in the proximal left
subclavian artery which appears occluded or virtually occluded on
series 2, image 71. Despite this, the left subclavian remains
patent. The left vertebral artery is patent with calcified plaque at
its origin. Tortuous proximal carotid arteries. Calcified
atherosclerosis at the skull base.

Limited intracranial: Within normal limits for age.

Visualized orbits: Postoperative changes to both globes.

Mastoids and visualized paranasal sinuses: Mucoperiosteal thickening
of the left maxillary sinus with polypoid opacity. Other visualized
paranasal sinuses are clear. Both tympanic cavities are clear. Right
greater than left inferior mastoid effusions.

Skeleton: Osteopenia. Degenerative changes in the spine. No acute
osseous abnormality identified.

Upper chest: No superior mediastinal lymphadenopathy. AP window
within normal limits. Left chest cardiac pacemaker. Negative lung
apices. Right shoulder arthroplasty.
IMPRESSION: 1. Some motion artifact at the hypopharynx. Mild soft tissue
asymmetry at the base of this on is nonspecific, but may simply
reflect lingual tonsil hypertrophy.
2. Otherwise no neck mass or lymphadenopathy.
3. Severe calcified atherosclerotic plaque with short segment
occlusion or near occlusion of the proximal left subclavian artery.
The distal left subclavian remains patent.
4. Chronic left maxillary sinusitis.

## 2016-11-03 ENCOUNTER — Encounter: Payer: Self-pay | Admitting: Respiratory Therapy

## 2016-11-03 DIAGNOSIS — J449 Chronic obstructive pulmonary disease, unspecified: Secondary | ICD-10-CM

## 2016-11-03 NOTE — Progress Notes (Signed)
Pulmonary Individual Treatment Plan  Patient Details  Name: [name] [name] MRN: [id number] Date of Birth: 13-Aug-1927 Referring Provider:    Initial Encounter Date:  Flowsheet Row Pulmonary Rehab from 09/30/2016 in [facility] Cardiac and Pulmonary Rehab  Date  09/30/16      Visit Diagnosis: Chronic obstructive pulmonary disease, unspecified COPD type ([place])  Patient's Home Medications on Admission:  Current Outpatient Prescriptions:    albuterol (PROVENTIL HFA;VENTOLIN HFA) 108 (90 BASE) MCG/ACT inhaler, Inhale 2 puffs into the lungs every 6 (six) hours as needed for wheezing or shortness of breath., Disp: , Rfl:    Albuterol Sulfate (PROAIR HFA IN), Inhale into the lungs., Disp: , Rfl:    amLODipine (NORVASC) 2.5 MG tablet, Take 2.5 mg by mouth daily., Disp: , Rfl:    aspirin 325 MG EC tablet, Take 325 mg by mouth daily., Disp: , Rfl:    atorvastatin (LIPITOR) 10 MG tablet, Take 10 mg by mouth daily., Disp: , Rfl:    carvedilol (COREG) 25 MG tablet, Take 25 mg by mouth 2 (two) times daily with a meal., Disp: , Rfl:    cloNIDine (CATAPRES) 0.2 MG tablet, Take 0.2 mg by mouth 2 (two) times daily., Disp: , Rfl:    clopidogrel (PLAVIX) 75 MG tablet, Take 75 mg by mouth daily., Disp: , Rfl:    ezetimibe (ZETIA) 10 MG tablet, Take 10 mg by mouth daily., Disp: , Rfl:    furosemide (LASIX) 40 MG tablet, Take 20 mg by mouth. , Disp: , Rfl:    metoprolol succinate (TOPROL-XL) 100 MG 24 hr tablet, Take 100 mg by mouth daily. Take with or immediately following a meal., Disp: , Rfl:    montelukast (SINGULAIR) 10 MG tablet, Take 10 mg by mouth at bedtime., Disp: , Rfl:    olmesartan-hydrochlorothiazide (BENICAR HCT) 40-25 MG per tablet, Take 1 tablet by mouth daily., Disp: , Rfl:    omeprazole (PRILOSEC) 40 MG capsule, Take 40 mg by mouth daily., Disp: , Rfl:    quinapril (ACCUPRIL) 40 MG tablet, Take 40 mg by mouth daily., Disp: , Rfl:    sildenafil (VIAGRA) 100 MG tablet, Take 100 mg by  mouth daily as needed for erectile dysfunction., Disp: , Rfl:    Umeclidinium-Vilanterol 62.5-25 MCG/INH AEPB, Inhale into the lungs., Disp: , Rfl:    vardenafil (LEVITRA) 20 MG tablet, Take 20 mg by mouth daily as needed for erectile dysfunction., Disp: , Rfl:   Past Medical History: Past Medical History:  Diagnosis Date   Breast cancer ([place]) 1978   left   CAD (coronary artery disease)    DVT (deep venous thrombosis) (HCC)    Hemorrhoids    History of knee replacement, total    Hypertension    Ischemic cardiomyopathy    Peripheral arterial disease (HCC)    Seizure disorder ([place])     Tobacco Use: History  Smoking Status   Former Smoker   Quit date: 12/05/1986  Smokeless Tobacco   Never Used    Labs: Recent Review Flowsheet Data    There is no flowsheet data to display.       ADL UCSD:     Pulmonary Assessment Scores    Row Name 09/30/16 1459         ADL UCSD   ADL Phase Entry     SOB Score total 62     Rest 0     Walk 4     Stairs 4     Bath 2  Dress 3     Shop 3        Pulmonary Function Assessment:     Pulmonary Function Assessment - 09/30/16 1458      Pulmonary Function Tests   FVC% 91 %   FEV1% 96 %   FEV1/FVC Ratio 71   RV% 75 %   DLCO% 67 %     Breath   Bilateral Breath Sounds Decreased;Clear   Shortness of Breath Yes;Limiting activity      Exercise Target Goals:    Exercise Program Goal: Individual exercise prescription set with THRR, safety & activity barriers. Participant demonstrates ability to understand and report RPE using BORG scale, to self-measure pulse accurately, and to acknowledge the importance of the exercise prescription.  Exercise Prescription Goal: Starting with aerobic activity 30 plus minutes a day, 3 days per week for initial exercise prescription. Provide home exercise prescription and guidelines that participant acknowledges understanding prior to discharge.  Activity Barriers & Risk  Stratification:     Activity Barriers & Cardiac Risk Stratification - 09/30/16 1457      Activity Barriers & Cardiac Risk Stratification   Activity Barriers Arthritis;Joint Problems;Shortness of Breath;Deconditioning;Muscular Weakness;History of Falls      6 Minute Walk:     6 Minute Walk    Row Name 09/30/16 1356         6 Minute Walk   Distance 845 feet     Walk Time 6 minutes     # of Rest Breaks 0     MPH 1.6     METS 2.23     RPE 13     Perceived Dyspnea  3     VO2 Peak 3.16     Symptoms No     Resting HR 60 bpm     Resting BP 114/50     Max Ex. HR 95 bpm     Max Ex. BP 152/56       Interval HR   Baseline HR 60     1 Minute HR 94     2 Minute HR 93     3 Minute HR 95     4 Minute HR 95     5 Minute HR 95     6 Minute HR 95     Interval Heart Rate? Yes       Interval Oxygen   Interval Oxygen? Yes     Baseline Oxygen Saturation % 97 %     1 Minute Oxygen Saturation % 95 %     2 Minute Oxygen Saturation % 96 %     3 Minute Oxygen Saturation % 96 %     4 Minute Oxygen Saturation % 96 %     5 Minute Oxygen Saturation % 96 %     6 Minute Oxygen Saturation % 96 %        Initial Exercise Prescription:     Initial Exercise Prescription - 09/30/16 1300      Date of Initial Exercise RX and Referring Provider   Date 09/30/16     Treadmill   MPH 1.4   Minutes 15   METs 2.07     NuStep   Level 2   Minutes 15   METs 2     T5 Nustep   Level 1   Minutes 15   METs 2     Biostep-RELP   Level 2   Minutes 15   METs 2     Prescription Details  Frequency (times per week) 3   Duration Progress to 45 minutes of aerobic exercise without signs/symptoms of physical distress     Intensity   THRR 40-80% of Max Heartrate 89-118   Ratings of Perceived Exertion 11-13   Perceived Dyspnea 0-4     Progression   Progression Continue to progress workloads to maintain intensity without signs/symptoms of physical distress.     Resistance Training    Training Prescription Yes   Weight 2   Reps 10-15      Perform Capillary Blood Glucose checks as needed.  Exercise Prescription Changes:     Exercise Prescription Changes    Row Name 10/06/16 1500 10/17/16 1300 10/22/16 1500         Exercise Review   Progression --  first full day of exercise  -- Yes       Response to Exercise   Blood Pressure (Admit) 110/64  -- 124/64     Blood Pressure (Exercise)  --  -- 118/66     Blood Pressure (Exit) 144/80  -- 112/60     Heart Rate (Admit) 68 bpm  -- 81 bpm     Heart Rate (Exercise) 92 bpm  -- 66 bpm     Heart Rate (Exit) 59 bpm  -- 62 bpm     Oxygen Saturation (Admit) 95 %  -- 99 %     Oxygen Saturation (Exercise) 98 %  -- 97 %     Oxygen Saturation (Exit) 99 %  -- 96 %     Rating of Perceived Exertion (Exercise) 14  -- 13     Perceived Dyspnea (Exercise) 3  -- 3     Symptoms none none none     Comments  -- Home Exercise Guidelines given 10/17/16 Home Exercise Guidelines given 10/17/16     Duration Progress to 45 minutes of aerobic exercise without signs/symptoms of physical distress Progress to 45 minutes of aerobic exercise without signs/symptoms of physical distress Progress to 45 minutes of aerobic exercise without signs/symptoms of physical distress     Intensity THRR unchanged THRR unchanged THRR unchanged       Progression   Progression Continue to progress workloads to maintain intensity without signs/symptoms of physical distress. Continue to progress workloads to maintain intensity without signs/symptoms of physical distress. Continue to progress workloads to maintain intensity without signs/symptoms of physical distress.     Average METs  --  -- 2.09       Resistance Training   Training Prescription Yes Yes Yes     Weight 2 2 Green Band     Reps 10-15 10-15 10-15       Interval Training   Interval Training  --  -- No       Treadmill   MPH 1.4 1.4 1.4     Grade  --  -- 0     Minutes 15  3/3/3 15  3/3/'3 9  3 '$ min x3      METs  --  -- 2.07       NuStep   Level  --  -- 2     Watts  --  -- --  78 spm     Minutes  --  -- 15     METs  --  -- 2.2       Biostep-RELP   Level '2 2 2     '$ Minutes '15 15 15     '$ METs '2 2 2  '$ 46 spm  Home Exercise Plan   Plans to continue exercise at  -- Home  walking and bands Home  walking and bands     Frequency  -- Add 1 additional day to program exercise sessions. Add 1 additional day to program exercise sessions.        Exercise Comments:     Exercise Comments    Row Name 10/06/16 1508 10/17/16 1336 10/22/16 1508       Exercise Comments  First full day of exercise!  Patient was oriented to gym and equipment including functions, settings, policies, and procedures.  Patient's individual exercise prescription and treatment plan were reviewed.  All starting workloads were established based on the results of the 6 minute walk test done at initial orientation visit.  The plan for exercise progression was also introduced and progression will be customized based on patient's performance and goals. Reviewed home exercise with pt today.  Pt plans to walk and use bands at home for exercise.  Reviewed THR, pulse, RPE, sign and symptoms, and when to call 911 or MD.  Also discussed weather considerations and indoor options.  Pt voiced understanding. Briannah is off to a good start with exercise.  She has already started to notice a difference.  We will continue to monitor her for progression.        Discharge Exercise Prescription (Final Exercise Prescription Changes):     Exercise Prescription Changes - 10/22/16 1500      Exercise Review   Progression Yes     Response to Exercise   Blood Pressure (Admit) 124/64   Blood Pressure (Exercise) 118/66   Blood Pressure (Exit) 112/60   Heart Rate (Admit) 81 bpm   Heart Rate (Exercise) 66 bpm   Heart Rate (Exit) 62 bpm   Oxygen Saturation (Admit) 99 %   Oxygen Saturation (Exercise) 97 %   Oxygen Saturation (Exit) 96 %    Rating of Perceived Exertion (Exercise) 13   Perceived Dyspnea (Exercise) 3   Symptoms none   Comments Home Exercise Guidelines given 10/17/16   Duration Progress to 45 minutes of aerobic exercise without signs/symptoms of physical distress   Intensity THRR unchanged     Progression   Progression Continue to progress workloads to maintain intensity without signs/symptoms of physical distress.   Average METs 2.09     Resistance Training   Training Prescription Yes   Weight Green Band   Reps 10-15     Interval Training   Interval Training No     Treadmill   MPH 1.4   Grade 0   Minutes 9  3 min x3   METs 2.07     NuStep   Level 2   Watts --  78 spm   Minutes 15   METs 2.2     Biostep-RELP   Level 2   Minutes 15   METs 2  46 spm     Home Exercise Plan   Plans to continue exercise at Home  walking and bands   Frequency Add 1 additional day to program exercise sessions.       Nutrition:  Target Goals: Understanding of nutrition guidelines, daily intake of sodium '1500mg'$ , cholesterol '200mg'$ , calories 30% from fat and 7% or less from saturated fats, daily to have 5 or more servings of fruits and vegetables.  Biometrics:     Pre Biometrics - 09/30/16 1356      Pre Biometrics   Height 5' 2.5" (1.588 m)   Weight 184 lb 6.4  oz (83.6 kg)   Waist Circumference 38.5 inches   Hip Circumference 48.25 inches   Waist to Hip Ratio 0.8 %   BMI (Calculated) 33.3       Nutrition Therapy Plan and Nutrition Goals:   Nutrition Discharge: Rate Your Plate Scores:   Psychosocial: Target Goals: Acknowledge presence or absence of depression, maximize coping skills, provide positive support system. Participant is able to verbalize types and ability to use techniques and skills needed for reducing stress and depression.  Initial Review & Psychosocial Screening:     Initial Psych Review & Screening - 09/30/16 [place]? Yes    Comments Ms Stucky has good support from her children; 2 children live close and can help if Ms Stell needs assistence. She states she is very independent and likes taking care of things herself. She is looking forward to [facility] and increasing her walking ability. Since she has a copay, she may switch to [employer] and use her [employer].     Barriers   Psychosocial barriers to participate in program The patient should benefit from training in stress management and relaxation.     Screening Interventions   Interventions Encouraged to exercise;Program counselor consult      Quality of Life Scores:   PHQ-9: Recent Review Flowsheet Data    Depression screen [facility] 2/9 09/30/2016   Decreased Interest 1   Down, Depressed, Hopeless 0   PHQ - 2 Score 1   Altered sleeping 3   Tired, decreased energy 3   Change in appetite 1   Feeling bad or failure about yourself  0   Trouble concentrating 1   Moving slowly or fidgety/restless 0   Suicidal thoughts 0   PHQ-9 Score 9   Difficult doing work/chores Very difficult      Psychosocial Evaluation and Intervention:     Psychosocial Evaluation - 10/06/16 1235      Psychosocial Evaluation & Interventions   Interventions Encouraged to exercise with the program and follow exercise prescription   Comments Counselor met with Abria today for initial psychosocial evaluation.  She is an 83 year old who has struggled with COPD for awhile now.  She lives alone but has (3) adult daughters who call and check on her everyday and Callee is also actively involved in her local church.  Yaniris has had multiple surgeries for shoulder, knee and hip replacements and has HBP as well.  She reports not sleeping well at all for a long time - and had a sleep study years ago for this.  Counselor assessed caffeine intake with Evva reporting drinks soft drinks up until 6PM at night.  Counselor encouraged decreasing the amount of soft drink intake and stop all caffeine  priior to 3PM to help with getting to sleep better.  Yenni has a good appetite and denies a history or current symptoms of depression or anxiety.  She states she is typically in a positive mood and other than her health and her finances, she has minimal stress in her life.  She has goals for losing a little weight and being able to do some of her normal household activities without shortness of breath.  She would also like to "get off some of this expensive medication."  Counselor encouarged Cydnie to exercise consistently and see if it helps sleep in addition to the decreased caffeine intake.  Staff will be following with Rod Holler throughout the course of  this program.        Psychosocial Re-Evaluation:  Education: Education Goals: Education classes will be provided on a weekly basis, covering required topics. Participant will state understanding/return demonstration of topics presented.  Learning Barriers/Preferences:     Learning Barriers/Preferences - 09/30/16 1458      Learning Barriers/Preferences   Learning Barriers None   Learning Preferences None      Education Topics: Initial Evaluation Education: - Verbal, written and demonstration of respiratory meds, RPE/PD scales, oximetry and breathing techniques. Instruction on use of nebulizers and MDIs: cleaning and proper use, rinsing mouth with steroid doses and importance of monitoring MDI activations. Flowsheet Row Pulmonary Rehab from 10/29/2016 in [facility] Cardiac and Pulmonary Rehab  Date  09/30/16  Educator  LB  Instruction Review Code  2- meets goals/outcomes      General Nutrition Guidelines/Fats and Fiber: -Group instruction provided by verbal, written material, models and posters to present the general guidelines for heart healthy nutrition. Gives an explanation and review of dietary fats and fiber. Flowsheet Row Pulmonary Rehab from 10/29/2016 in [facility] Cardiac and Pulmonary Rehab  Date  10/06/16  Educator  CR  Instruction Review Code   2- meets goals/outcomes      Controlling Sodium/Reading Food Labels: -Group verbal and written material supporting the discussion of sodium use in heart healthy nutrition. Review and explanation with models, verbal and written materials for utilization of the food label. Flowsheet Row Pulmonary Rehab from 10/29/2016 in [facility] Cardiac and Pulmonary Rehab  Date  10/13/16  Educator  CR  Instruction Review Code  2- meets goals/outcomes      Exercise Physiology & Risk Factors: - Group verbal and written instruction with models to review the exercise physiology of the cardiovascular system and associated critical values. Details cardiovascular disease risk factors and the goals associated with each risk factor.   Aerobic Exercise & Resistance Training: - Gives group verbal and written discussion on the health impact of inactivity. On the components of aerobic and resistive training programs and the benefits of this training and how to safely progress through these programs. Flowsheet Row Pulmonary Rehab from 10/29/2016 in [facility] Cardiac and Pulmonary Rehab  Date  10/22/16  Educator  [facility]  Instruction Review Code  2- meets goals/outcomes      Flexibility, Balance, General Exercise Guidelines: - Provides group verbal and written instruction on the benefits of flexibility and balance training programs. Provides general exercise guidelines with specific guidelines to those with heart or lung disease. Demonstration and skill practice provided.   Stress Management: - Provides group verbal and written instruction about the health risks of elevated stress, cause of high stress, and healthy ways to reduce stress.   Depression: - Provides group verbal and written instruction on the correlation between heart/lung disease and depressed mood, treatment options, and the stigmas associated with seeking treatment.   Exercise & Equipment Safety: - Individual verbal instruction and demonstration of equipment  use and safety with use of the equipment. Flowsheet Row Pulmonary Rehab from 10/29/2016 in [facility] Cardiac and Pulmonary Rehab  Date  10/06/16  Educator  KH/AS  Instruction Review Code  2- meets goals/outcomes      Infection Prevention: - Provides verbal and written material to individual with discussion of infection control including proper hand washing and proper equipment cleaning during exercise session. Flowsheet Row Pulmonary Rehab from 10/29/2016 in [facility] Cardiac and Pulmonary Rehab  Date  10/06/16  Educator  KH/AS  Instruction Review Code  2- meets goals/outcomes  Falls Prevention: - Provides verbal and written material to individual with discussion of falls prevention and safety. Flowsheet Row Pulmonary Rehab from 10/29/2016 in [facility] Cardiac and Pulmonary Rehab  Date  09/30/16  Educator  LB  Instruction Review Code  2- meets goals/outcomes      Diabetes: - Individual verbal and written instruction to review signs/symptoms of diabetes, desired ranges of glucose level fasting, after meals and with exercise. Advice that pre and post exercise glucose checks will be done for 3 sessions at entry of program.   Chronic Lung Diseases: - Group verbal and written instruction to review new updates, new respiratory medications, new advancements in procedures and treatments. Provide informative websites and "800" numbers of self-education. Flowsheet Row Pulmonary Rehab from 10/29/2016 in [facility] Cardiac and Pulmonary Rehab  Date  10/15/16  Educator  LB  Instruction Review Code  2- meets goals/outcomes      Lung Procedures: - Group verbal and written instruction to describe testing methods done to diagnose lung disease. Review the outcome of test results. Describe the treatment choices: Pulmonary Function Tests, ABGs and oximetry.   Energy Conservation: - Provide group verbal and written instruction for methods to conserve energy, plan and organize activities. Instruct on pacing techniques,  use of adaptive equipment and posture/positioning to relieve shortness of breath.   Triggers: - Group verbal and written instruction to review types of environmental controls: home humidity, furnaces, filters, dust mite/pet prevention, HEPA vacuums. To discuss weather changes, air quality and the benefits of nasal washing.   Exacerbations: - Group verbal and written instruction to provide: warning signs, infection symptoms, calling MD promptly, preventive modes, and value of vaccinations. Review: effective airway clearance, coughing and/or vibration techniques. Create an Sports administrator.   Oxygen: - Individual and group verbal and written instruction on oxygen therapy. Includes supplement oxygen, available portable oxygen systems, continuous and intermittent flow rates, oxygen safety, concentrators, and Medicare reimbursement for oxygen.   Respiratory Medications: - Group verbal and written instruction to review medications for lung disease. Drug class, frequency, complications, importance of spacers, rinsing mouth after steroid MDI's, and proper cleaning methods for nebulizers. Flowsheet Row Pulmonary Rehab from 10/29/2016 in [facility] Cardiac and Pulmonary Rehab  Date  09/30/16  Educator  LB  Instruction Review Code  2- meets goals/outcomes      AED/CPR: - Group verbal and written instruction with the use of models to demonstrate the basic use of the AED with the basic ABC's of resuscitation.   Breathing Retraining: - Provides individuals verbal and written instruction on purpose, frequency, and proper technique of diaphragmatic breathing and pursed-lipped breathing. Applies individual practice skills. Flowsheet Row Pulmonary Rehab from 10/29/2016 in [facility] Cardiac and Pulmonary Rehab  Date  09/30/16  Educator  LB  Instruction Review Code  2- meets goals/outcomes      Anatomy and Physiology of the Lungs: - Group verbal and written instruction with the use of models to provide basic lung  anatomy and physiology related to function, structure and complications of lung disease.   Heart Failure: - Group verbal and written instruction on the basics of heart failure: signs/symptoms, treatments, explanation of ejection fraction, enlarged heart and cardiomyopathy.   Sleep Apnea: - Individual verbal and written instruction to review Obstructive Sleep Apnea. Review of risk factors, methods for diagnosing and types of masks and machines for OSA.   Anxiety: - Provides group, verbal and written instruction on the correlation between heart/lung disease and anxiety, treatment options, and management of anxiety.  Relaxation: - Provides group, verbal and written instruction about the benefits of relaxation for patients with heart/lung disease. Also provides patients with examples of relaxation techniques. Flowsheet Row Pulmonary Rehab from 10/29/2016 in [facility] Cardiac and Pulmonary Rehab  Date  10/29/16  Educator  [facility]  Instruction Review Code  2- Meets goals/outcomes      Knowledge Questionnaire Score:     Knowledge Questionnaire Score - 09/30/16 1458      Knowledge Questionnaire Score   Pre Score 5/10       Core Components/Risk Factors/Patient Goals at Admission:     Personal Goals and Risk Factors at Admission - 09/30/16 1502      Core Components/Risk Factors/Patient Goals on Admission   Sedentary Yes  Goal: increase walking; walks her dog in the back yard around the fence line.   Intervention Provide advice, education, support and counseling about physical activity/exercise needs.;Develop an individualized exercise prescription for aerobic and resistive training based on initial evaluation findings, risk stratification, comorbidities and participant's personal goals.   Expected Outcomes Achievement of increased cardiorespiratory fitness and enhanced flexibility, muscular endurance and strength shown through measurements of functional capacity and personal statement of  participant.   Increase Strength and Stamina Yes   Intervention Provide advice, education, support and counseling about physical activity/exercise needs.;Develop an individualized exercise prescription for aerobic and resistive training based on initial evaluation findings, risk stratification, comorbidities and participant's personal goals.   Expected Outcomes Achievement of increased cardiorespiratory fitness and enhanced flexibility, muscular endurance and strength shown through measurements of functional capacity and personal statement of participant.   Improve shortness of breath with ADL's Yes   Intervention Provide education, individualized exercise plan and daily activity instruction to help decrease symptoms of SOB with activities of daily living.   Expected Outcomes Short Term: Achieves a reduction of symptoms when performing activities of daily living.   Develop more efficient breathing techniques such as purse lipped breathing and diaphragmatic breathing; and practicing self-pacing with activity Yes   Intervention Provide education, demonstration and support about specific breathing techniuqes utilized for more efficient breathing. Include techniques such as pursed lipped breathing, diaphragmatic breathing and self-pacing activity.   Expected Outcomes Short Term: Participant will be able to demonstrate and use breathing techniques as needed throughout daily activities.   Increase knowledge of respiratory medications and ability to use respiratory devices properly  Yes  Symbicort, Spiriva, Proventil, Spacer given; Duoneb   Intervention Provide education and demonstration as needed of appropriate use of medications, inhalers, and oxygen therapy.   Expected Outcomes Short Term: Achieves understanding of medications use. Understands that oxygen is a medication prescribed by physician. Demonstrates appropriate use of inhaler and oxygen therapy.   Hypertension Yes   Intervention Provide  education on lifestyle modifcations including regular physical activity/exercise, weight management, moderate sodium restriction and increased consumption of fresh fruit, vegetables, and low fat dairy, alcohol moderation, and smoking cessation.;Monitor prescription use compliance.   Expected Outcomes Short Term: Continued assessment and intervention until BP is < 140/36m HG in hypertensive participants. < 130/876mHG in hypertensive participants with diabetes, heart failure or chronic kidney disease.;Long Term: Maintenance of blood pressure at goal levels.   Lipids Yes   Intervention Provide education and support for participant on nutrition & aerobic/resistive exercise along with prescribed medications to achieve LDL '70mg'$ , HDL >'40mg'$ .   Expected Outcomes Short Term: Participant states understanding of desired cholesterol values and is compliant with medications prescribed. Participant is following exercise prescription and nutrition guidelines.;Long Term: Cholesterol controlled  with medications as prescribed, with individualized exercise RX and with personalized nutrition plan. Value goals: LDL < '70mg'$ , HDL > 40 mg.      Core Components/Risk Factors/Patient Goals Review:      Goals and Risk Factor Review    Row Name 10/21/16 [phone]             Core Components/Risk Factors/Patient Goals Review   Personal Goals Review Sedentary;Increase Strength and Stamina;Improve shortness of breath with ADL's;Develop more efficient breathing techniques such as purse lipped breathing and diaphragmatic breathing and practicing self-pacing with activity.;Increase knowledge of respiratory medications and ability to use respiratory devices properly.;Hypertension;Lipids       Review Ms Gioffre is progressing with her exercise goals. She is now exercising 15 minutes on the BioS and NS. The treadmill is difficult for her, so she in doing set work and plans to increase her set times to 4 mins her next session. She uses  PLB without cueing and manages her activities at home with pacing. A big concern for her is the cost of her inhalers. I have talked with her about switching to her nebulizer which Medicare Part D helps pay for these drugs. Ms Lezotte has been using the spacer I gave her for her Proventil. She is now considering meeting with the dietitian. Her blood pressures are acceptable and she is compiant with her BP and Lipid drugs.       Expected Outcomes Continue progressing with her exercise and continue working with her on her respiratory medications.          Core Components/Risk Factors/Patient Goals at Discharge (Final Review):      Goals and Risk Factor Review - 10/21/16 0616      Core Components/Risk Factors/Patient Goals Review   Personal Goals Review Sedentary;Increase Strength and Stamina;Improve shortness of breath with ADL's;Develop more efficient breathing techniques such as purse lipped breathing and diaphragmatic breathing and practicing self-pacing with activity.;Increase knowledge of respiratory medications and ability to use respiratory devices properly.;Hypertension;Lipids   Review Ms Longstreth is progressing with her exercise goals. She is now exercising 15 minutes on the BioS and NS. The treadmill is difficult for her, so she in doing set work and plans to increase her set times to 4 mins her next session. She uses PLB without cueing and manages her activities at home with pacing. A big concern for her is the cost of her inhalers. I have talked with her about switching to her nebulizer which Medicare Part D helps pay for these drugs. Ms Mignone has been using the spacer I gave her for her Proventil. She is now considering meeting with the dietitian. Her blood pressures are acceptable and she is compiant with her BP and Lipid drugs.   Expected Outcomes Continue progressing with her exercise and continue working with her on her respiratory medications.      ITP Comments:     ITP Comments     Row Name 09/30/16 1603 10/24/16 1131         ITP Comments  Ms Westergaard plans to start [facility] on 10/06/16 and attend 2-3 days/week. She does have a co-pay and has [employer], so Ms Boggus may switch to the [employer] after several weeks. Attended [employer] Education Class         Comments: 30 Day Note Review

## 2016-11-03 NOTE — Progress Notes (Signed)
Daily Session Note  Patient Details  Name: [name] [name] MRN: [id number] Date of Birth: 06/10/1927 Referring Provider:    Encounter Date: 11/03/2016  Check In:     Session Check In - 11/03/16 1346      Check-In   Location [facility]   Staff Present Nyoka Cowden, RN, BSN, Kela Millin, BA, ACSM CEP, Exercise Physiologist;Laureen Owens Shark, BS, RRT, Respiratory Therapist   Supervising physician immediately available to respond to emergencies [facility] immediately available ER MD   Physician(s) Jacqualine Code and Mariea Clonts   Medication changes reported     No   Fall or balance concerns reported    No   Warm-up and Cool-down Performed as group-led instruction   Resistance Training Performed No   VAD Patient? No         Goals Met:  Proper associated with RPD/PD & O2 Sat Independence with exercise equipment Exercise tolerated well Strength training completed today  Goals Unmet:  Not Applicable  Comments: Pt able to follow exercise prescription today without complaint.  Will continue to monitor for progression.    Dr. Emily Filbert is Medical Director for [place] and [facility].

## 2016-11-05 ENCOUNTER — Encounter: Payer: PPO | Admitting: Respiratory Therapy

## 2016-11-05 DIAGNOSIS — J449 Chronic obstructive pulmonary disease, unspecified: Secondary | ICD-10-CM | POA: Diagnosis not present

## 2016-11-05 NOTE — Progress Notes (Signed)
Daily Session Note  Patient Details  Name: CING Piney MRN: [id number] Date of Birth: 1927-04-25 Referring Provider:    Encounter Date: 11/05/2016  Check In:     Session Check In - 11/05/16 1154      Check-In   Location [facility]   Staff Present Carson Myrtle, BS, RRT, Respiratory Lennie Hummer, MA, ACSM RCEP, Exercise Physiologist;Kamla Skilton RN BSN   Supervising physician immediately available to respond to emergencies [facility] immediately available ER MD   Physician(s) Drs. Malinda & McShane   Medication changes reported     No   Fall or balance concerns reported    No   Warm-up and Cool-down Performed as group-led instruction   Resistance Training Performed Yes   VAD Patient? No     Pain Assessment   Currently in Pain? No/denies   Multiple Pain Sites No         Goals Met:  Proper associated with RPD/PD & O2 Sat Independence with exercise equipment Exercise tolerated well Strength training completed today  Goals Unmet:  Not Applicable  Comments: Pt able to follow exercise prescription today without complaint.  Will continue to monitor for progression.    Dr. Emily Filbert is Medical Director for [place] and [facility].

## 2016-11-07 DIAGNOSIS — I480 Paroxysmal atrial fibrillation: Secondary | ICD-10-CM | POA: Diagnosis not present

## 2016-11-07 DIAGNOSIS — I482 Chronic atrial fibrillation: Secondary | ICD-10-CM | POA: Diagnosis not present

## 2016-11-07 DIAGNOSIS — I10 Essential (primary) hypertension: Secondary | ICD-10-CM | POA: Diagnosis not present

## 2016-11-07 DIAGNOSIS — J410 Simple chronic bronchitis: Secondary | ICD-10-CM | POA: Diagnosis not present

## 2016-11-07 DIAGNOSIS — Z0000 Encounter for general adult medical examination without abnormal findings: Secondary | ICD-10-CM | POA: Diagnosis not present

## 2016-11-07 DIAGNOSIS — Z950 Presence of cardiac pacemaker: Secondary | ICD-10-CM | POA: Diagnosis not present

## 2016-11-07 DIAGNOSIS — N3941 Urge incontinence: Secondary | ICD-10-CM | POA: Diagnosis not present

## 2016-11-10 DIAGNOSIS — J449 Chronic obstructive pulmonary disease, unspecified: Secondary | ICD-10-CM | POA: Diagnosis not present

## 2016-11-10 NOTE — Progress Notes (Signed)
Daily Session Note  Patient Details  Name: [name] [name] MRN: [id number] Date of Birth: 04/18/1927 Referring Provider:    Encounter Date: 11/10/2016  Check In:     Session Check In - 11/10/16 1249      Check-In   Location [facility]   Staff Present Laureen Brown, BS, RRT, Respiratory Therapist;Kelly Hayes, BS, ACSM CEP, Exercise Physiologist;Amanda Sommer, BA, ACSM CEP, Exercise Physiologist   Supervising physician immediately available to respond to emergencies [facility] immediately available ER MD   Physician(s) Veronese and Kinner   Medication changes reported     No   Fall or balance concerns reported    No   Warm-up and Cool-down Performed as group-led instruction   Resistance Training Performed Yes   VAD Patient? No     Pain Assessment   Currently in Pain? No/denies         Goals Met:  Proper associated with RPD/PD & O2 Sat Independence with exercise equipment Exercise tolerated well Strength training completed today  Goals Unmet:  Not Applicable  Comments: Pt able to follow exercise prescription today without complaint.  Will continue to monitor for progression.    Dr. Mark Miller is Medical Director for [facility] and [facility]. 

## 2016-11-12 ENCOUNTER — Encounter: Payer: PPO | Admitting: *Deleted

## 2016-11-12 DIAGNOSIS — J449 Chronic obstructive pulmonary disease, unspecified: Secondary | ICD-10-CM | POA: Diagnosis not present

## 2016-11-12 NOTE — Progress Notes (Signed)
Daily Session Note  Patient Details  Name: [name] [name] MRN: [id number] Date of Birth: November 26, 1926 Referring Provider:    Encounter Date: 11/12/2016  Check In:     Session Check In - 11/12/16 1138      Check-In   Location [facility]   Staff Present Alberteen Sam, MA, ACSM RCEP, Exercise Physiologist;Laureen Owens Shark, BS, RRT, Respiratory Therapist;Carroll Enterkin, RN, BSN   Supervising physician immediately available to respond to emergencies [facility] immediately available ER MD   Physician(s) Drs. Quentin Cornwall and [place]   Medication changes reported     No   Fall or balance concerns reported    No   Warm-up and Cool-down Performed as group-led Location manager Performed Yes   VAD Patient? No     Pain Assessment   Currently in Pain? No/denies   Multiple Pain Sites No         Goals Met:  Proper associated with RPD/PD & O2 Sat Independence with exercise equipment Using PLB without cueing & demonstrates good technique Exercise tolerated well Strength training completed today  Goals Unmet:  Not Applicable  Comments: Pt able to follow exercise prescription today without complaint.  Will continue to monitor for progression.    Dr. Emily Filbert is Medical Director for [place] and [facility].

## 2016-11-14 ENCOUNTER — Encounter: Payer: PPO | Admitting: *Deleted

## 2016-11-14 DIAGNOSIS — J449 Chronic obstructive pulmonary disease, unspecified: Secondary | ICD-10-CM | POA: Diagnosis not present

## 2016-11-14 NOTE — Progress Notes (Signed)
Daily Session Note  Patient Details  Name: [name] [name] MRN: [id number] Date of Birth: 1927-06-28 Referring Provider:    Encounter Date: 11/14/2016  Check In:     Session Check In - 11/14/16 1123      Check-In   Location [facility]   Staff Present Alberteen Sam, MA, ACSM RCEP, Exercise Physiologist;Carroll Enterkin, RN, Vickki Hearing, BA, ACSM CEP, Exercise Physiologist   Supervising physician immediately available to respond to emergencies [facility] immediately available ER MD   Physician(s) Drs. Joni Fears and [place]   Medication changes reported     No   Fall or balance concerns reported    No   Warm-up and Cool-down Performed as group-led Location manager Performed Yes   VAD Patient? No     Pain Assessment   Currently in Pain? No/denies   Multiple Pain Sites No         Goals Met:  Proper associated with RPD/PD & O2 Sat Independence with exercise equipment Using PLB without cueing & demonstrates good technique Exercise tolerated well Strength training completed today  Goals Unmet:  Not Applicable  Comments: Pt able to follow exercise prescription today without complaint.  Will continue to monitor for progression.    Dr. Emily Filbert is Medical Director for [place] and [facility].

## 2016-11-17 DIAGNOSIS — J449 Chronic obstructive pulmonary disease, unspecified: Secondary | ICD-10-CM | POA: Diagnosis not present

## 2016-11-17 NOTE — Progress Notes (Signed)
Daily Session Note  Patient Details  Name: [name] [name] MRN: [id number] Date of Birth: 01-26-1927 Referring Provider:    Encounter Date: 11/17/2016  Check In:     Session Check In - 11/17/16 1447      Check-In   Location [facility]   Staff Present Nada Maclachlan, BA, ACSM CEP, Exercise Physiologist;Laureen Owens Shark, BS, RRT, Respiratory Bertis Ruddy, BS, ACSM CEP, Exercise Physiologist   Supervising physician immediately available to respond to emergencies [facility] immediately available ER MD   Physician(s) Jimmye Norman and Quentin Cornwall   Medication changes reported     No   Fall or balance concerns reported    No   Warm-up and Cool-down Performed as group-led instruction   Resistance Training Performed Yes   VAD Patient? No         History  Smoking Status  . Former Smoker  . Quit date: 12/05/1986  Smokeless Tobacco  . Never Used    Goals Met:  Proper associated with RPD/PD & O2 Sat Independence with exercise equipment Exercise tolerated well Strength training completed today  Goals Unmet:  Not Applicable  Comments: Pt able to follow exercise prescription today without complaint.  Will continue to monitor for progression.    Dr. Emily Filbert is Medical Director for [place] and [facility].

## 2016-11-19 ENCOUNTER — Encounter: Payer: PPO | Admitting: *Deleted

## 2016-11-19 DIAGNOSIS — J449 Chronic obstructive pulmonary disease, unspecified: Secondary | ICD-10-CM | POA: Diagnosis not present

## 2016-11-19 NOTE — Progress Notes (Signed)
Daily Session Note  Patient Details  Name: [name] [name] MRN: [id number] Date of Birth: 1927/04/15 Referring Provider:    Encounter Date: 11/19/2016  Check In:     Session Check In - 11/19/16 1338      Check-In   Location [facility]   Staff Present Alberteen Sam, MA, ACSM RCEP, Exercise Physiologist;Laureen Owens Shark, BS, RRT, Respiratory Therapist;Other  Darel Hong, RN   Supervising physician immediately available to respond to emergencies [facility] immediately available ER MD   Physician(s) Drs. Jimmye Norman and Paduch[place]   Medication changes reported     No   Fall or balance concerns reported    No   Warm-up and Cool-down Performed as group-led Location manager Performed Yes   VAD Patient? No     Pain Assessment   Currently in Pain? No/denies   Multiple Pain Sites No         History  Smoking Status  . Former Smoker  . Quit date: 12/05/1986  Smokeless Tobacco  . Never Used    Goals Met:  Proper associated with RPD/PD & O2 Sat Independence with exercise equipment Using PLB without cueing & demonstrates good technique Exercise tolerated well Strength training completed today  Goals Unmet:  Not Applicable  Comments: Pt able to follow exercise prescription today without complaint.  Will continue to monitor for progression.    Dr. Emily Filbert is Medical Director for [place] and [facility].

## 2016-11-21 ENCOUNTER — Encounter: Payer: PPO | Attending: Internal Medicine | Admitting: *Deleted

## 2016-11-21 DIAGNOSIS — J449 Chronic obstructive pulmonary disease, unspecified: Secondary | ICD-10-CM

## 2016-11-21 NOTE — Progress Notes (Signed)
Daily Session Note  Patient Details  Name: [name] [name] MRN: [id number] Date of Birth: 05-Aug-1927 Referring Provider:    Encounter Date: 11/21/2016  Check In:     Session Check In - 11/21/16 1156      Check-In   Location [facility]   Staff Present Alberteen Sam, MA, ACSM RCEP, Exercise Physiologist;Patricia Surles RN BSN   Supervising physician immediately available to respond to emergencies [facility] immediately available ER MD   Physician(s) Drs. Lord & Schaevitz   Medication changes reported     No   Fall or balance concerns reported    No   Tobacco Cessation No Change   Warm-up and Cool-down Performed as group-led Location manager Performed Yes   VAD Patient? No     Pain Assessment   Currently in Pain? No/denies   Multiple Pain Sites No         History  Smoking Status  . Former Smoker  . Quit date: 12/05/1986  Smokeless Tobacco  . Never Used    Goals Met:  Proper associated with RPD/PD & O2 Sat Independence with exercise equipment Improved SOB with ADL's Using PLB without cueing & demonstrates good technique Exercise tolerated well Strength training completed today  Goals Unmet:  Not Applicable  Comments: Pt able to follow exercise prescription today without complaint.  Will continue to monitor for progression.     [place] Name 09/30/16 1356 11/21/16 1226       6 Minute Walk   Phase  - Mid Program    Distance 845 feet 965 feet    Distance % Change  - 14.2 %  120 ft    Walk Time 6 minutes 6 minutes    # of Rest Breaks 0 0    MPH 1.6 1.83    METS 2.23 2.4    RPE 13 17    Perceived Dyspnea  3 3    VO2 Peak 3.16 3.69    Symptoms No Yes (comment)    Comments  - SOB    Resting HR 60 bpm 82 bpm    Resting BP 114/50 124/64    Max Ex. HR 95 bpm 102 bpm    Max Ex. BP 152/56 132/70    2 Minute Post BP  - 126/64      Interval HR   Baseline HR 60 82    1 Minute HR 94 95    2 Minute HR 93 95    3 Minute HR 95 94    4 Minute HR 95 95    5 Minute HR 95 95    6 Minute HR 95 102    2 Minute Post HR  - 93    Interval Heart Rate? Yes Yes      Interval Oxygen   Interval Oxygen? Yes Yes    Baseline Oxygen Saturation % 97 % 99 %    Baseline Liters of Oxygen  - 0 L  Room Air    1 Minute Oxygen Saturation % 95 % 94 %    1 Minute Liters of Oxygen  - 0 L    2 Minute Oxygen Saturation % 96 % 94 %    2 Minute Liters of Oxygen  - 0 L    3 Minute Oxygen Saturation % 96 % 94 %    3 Minute Liters of Oxygen  - 0 L    4 Minute Oxygen Saturation % 96 %  94 %    4 Minute Liters of Oxygen  - 0 L    5 Minute Oxygen Saturation % 96 % 94 %    5 Minute Liters of Oxygen  - 0 L    6 Minute Oxygen Saturation % 96 % 94 %    6 Minute Liters of Oxygen  - 0 L    2 Minute Post Oxygen Saturation %  - 96 %    2 Minute Post Liters of Oxygen  - 0 L         Dr. Emily Filbert is Medical Director for [place] and [facility].

## 2016-11-24 DIAGNOSIS — J449 Chronic obstructive pulmonary disease, unspecified: Secondary | ICD-10-CM

## 2016-11-24 NOTE — Progress Notes (Signed)
Daily Session Note  Patient Details  Name: [name] [name] MRN: [id number] Date of Birth: [birth year] Referring Provider:    Encounter Date: 11/24/2016  Check In:     Session Check In - 11/24/16 1353      Check-In   Location [facility]   Staff Present Carson Myrtle, BS, RRT, Respiratory Therapist;Kelly Amedeo Plenty, BS, ACSM CEP, Exercise Physiologist;Amanda Oletta Darter, BA, ACSM CEP, Exercise Physiologist   Supervising physician immediately available to respond to emergencies [facility] immediately available ER MD   Physician(s) Corky Downs and Jimmye Norman   Medication changes reported     No   Fall or balance concerns reported    No   Tobacco Cessation No Change   Warm-up and Cool-down Performed as group-led instruction   Resistance Training Performed Yes   VAD Patient? No     Pain Assessment   Currently in Pain? No/denies         History  Smoking Status  . Former Smoker  . Quit date: 12/05/1986  Smokeless Tobacco  . Never Used    Goals Met:  Proper associated with RPD/PD & O2 Sat Independence with exercise equipment Exercise tolerated well Strength training completed today  Goals Unmet:  Not Applicable  Comments: Pt able to follow exercise prescription today without complaint.  Will continue to monitor for progression.    Dr. Emily Filbert is Medical Director for [place] and [facility].

## 2016-11-26 ENCOUNTER — Encounter: Payer: PPO | Admitting: *Deleted

## 2016-11-26 DIAGNOSIS — J449 Chronic obstructive pulmonary disease, unspecified: Secondary | ICD-10-CM

## 2016-11-26 NOTE — Progress Notes (Signed)
Daily Session Note  Patient Details  Name: [name] [name] MRN: [id number] Date of Birth: 1927/08/29 Referring Provider:    Encounter Date: 11/26/2016  Check In:     Session Check In - 11/26/16 1119      Check-In   Location [facility]   Staff Present Alberteen Sam, MA, ACSM RCEP, Exercise Physiologist;Laureen Owens Shark, BS, RRT, Respiratory Dareen Piano, BA, ACSM CEP, Exercise Physiologist   Supervising physician immediately available to respond to emergencies [facility] immediately available ER MD   Physician(s) Drs. Kinner and [employer]   Medication changes reported     No   Fall or balance concerns reported    No   Tobacco Cessation --  former smoker   Warm-up and Cool-down Performed as group-led Location manager Performed Yes   VAD Patient? No     Pain Assessment   Currently in Pain? No/denies   Multiple Pain Sites No         History  Smoking Status  . Former Smoker  . Quit date: 12/05/1986  Smokeless Tobacco  . Never Used    Goals Met:  Proper associated with RPD/PD & O2 Sat Independence with exercise equipment Using PLB without cueing & demonstrates good technique Exercise tolerated well No report of cardiac concerns or symptoms Strength training completed today  Goals Unmet:  Not Applicable  Comments: Pt able to follow exercise prescription today without complaint.  Will continue to monitor for progression.    Dr. Emily Filbert is Medical Director for [place] and [facility].

## 2016-11-28 ENCOUNTER — Encounter: Payer: PPO | Admitting: *Deleted

## 2016-11-28 DIAGNOSIS — J449 Chronic obstructive pulmonary disease, unspecified: Secondary | ICD-10-CM

## 2016-11-28 NOTE — Progress Notes (Signed)
Daily Session Note  Patient Details  Name: [name] [name] MRN: [id number] Date of Birth: 1926-09-27 Referring Provider:    Encounter Date: 11/28/2016  Check In:     Session Check In - 11/28/16 1213      Check-In   Location [facility]   Staff Present Alberteen Sam, MA, ACSM RCEP, Exercise Physiologist;Other  Darel Hong RN BSN   Supervising physician immediately available to respond to emergencies [facility] immediately available ER MD   Physician(s) Drs. Quentin Cornwall and [employer]   Medication changes reported     No   Fall or balance concerns reported    No   Warm-up and Cool-down Performed as group-led Location manager Performed Yes   VAD Patient? No     Pain Assessment   Currently in Pain? No/denies   Multiple Pain Sites No         History  Smoking Status  . Former Smoker  . Quit date: 12/05/1986  Smokeless Tobacco  . Never Used    Goals Met:  Proper associated with RPD/PD & O2 Sat Independence with exercise equipment Using PLB without cueing & demonstrates good technique Exercise tolerated well Strength training completed today  Goals Unmet:  Not Applicable  Comments: Pt able to follow exercise prescription today without complaint.  Will continue to monitor for progression.    Dr. Emily Filbert is Medical Director for [place] and [facility].

## 2016-12-01 ENCOUNTER — Encounter: Payer: Self-pay | Admitting: *Deleted

## 2016-12-01 DIAGNOSIS — J449 Chronic obstructive pulmonary disease, unspecified: Secondary | ICD-10-CM

## 2016-12-01 NOTE — Progress Notes (Signed)
Pulmonary Individual Treatment Plan  Patient Details  Name: [name] [name] MRN: [id number] Date of Birth: 01/26/1927 Referring Provider:    Initial Encounter Date:  Flowsheet Row Pulmonary Rehab from 09/30/2016 in [facility] Cardiac and Pulmonary Rehab  Date  09/30/16      Visit Diagnosis: Chronic obstructive pulmonary disease, unspecified COPD type ([place])  Patient's Home Medications on Admission:  Current Outpatient Prescriptions:  .  albuterol (PROVENTIL HFA;VENTOLIN HFA) 108 (90 BASE) MCG/ACT inhaler, Inhale 2 puffs into the lungs every 6 (six) hours as needed for wheezing or shortness of breath., Disp: , Rfl:  .  Albuterol Sulfate (PROAIR HFA IN), Inhale into the lungs., Disp: , Rfl:  .  amLODipine (NORVASC) 2.5 MG tablet, Take 2.5 mg by mouth daily., Disp: , Rfl:  .  aspirin 325 MG EC tablet, Take 325 mg by mouth daily., Disp: , Rfl:  .  atorvastatin (LIPITOR) 10 MG tablet, Take 10 mg by mouth daily., Disp: , Rfl:  .  carvedilol (COREG) 25 MG tablet, Take 25 mg by mouth 2 (two) times daily with a meal., Disp: , Rfl:  .  cloNIDine (CATAPRES) 0.2 MG tablet, Take 0.2 mg by mouth 2 (two) times daily., Disp: , Rfl:  .  clopidogrel (PLAVIX) 75 MG tablet, Take 75 mg by mouth daily., Disp: , Rfl:  .  ezetimibe (ZETIA) 10 MG tablet, Take 10 mg by mouth daily., Disp: , Rfl:  .  furosemide (LASIX) 40 MG tablet, Take 20 mg by mouth. , Disp: , Rfl:  .  metoprolol succinate (TOPROL-XL) 100 MG 24 hr tablet, Take 100 mg by mouth daily. Take with or immediately following a meal., Disp: , Rfl:  .  montelukast (SINGULAIR) 10 MG tablet, Take 10 mg by mouth at bedtime., Disp: , Rfl:  .  olmesartan-hydrochlorothiazide (BENICAR HCT) 40-25 MG per tablet, Take 1 tablet by mouth daily., Disp: , Rfl:  .  omeprazole (PRILOSEC) 40 MG capsule, Take 40 mg by mouth daily., Disp: , Rfl:  .  quinapril (ACCUPRIL) 40 MG tablet, Take 40 mg by mouth daily., Disp: , Rfl:  .  sildenafil (VIAGRA) 100 MG tablet, Take 100 mg by  mouth daily as needed for erectile dysfunction., Disp: , Rfl:  .  Umeclidinium-Vilanterol 62.5-25 MCG/INH AEPB, Inhale into the lungs., Disp: , Rfl:  .  vardenafil (LEVITRA) 20 MG tablet, Take 20 mg by mouth daily as needed for erectile dysfunction., Disp: , Rfl:   Past Medical History: Past Medical History:  Diagnosis Date  . Breast cancer ([place]) 1978   left  . CAD (coronary artery disease)   . DVT (deep venous thrombosis) ([place])   . Hemorrhoids   . History of knee replacement, total   . Hypertension   . Ischemic cardiomyopathy   . Peripheral arterial disease ([place])   . Seizure disorder ([place])     Tobacco Use: History  Smoking Status  . Former Smoker  . Quit date: 12/05/1986  Smokeless Tobacco  . Never Used    Labs: Recent Review Flowsheet Data    There is no flowsheet data to display.       ADL UCSD:     Pulmonary Assessment Scores    Row Name 09/30/16 1459 11/19/16 1532       ADL UCSD   ADL Phase Entry Mid    SOB Score total 62 55    Rest 0 1    Walk 4 2    Stairs 4 4    Bath 2 3  Dress 3 3    Shop 3 3       Pulmonary Function Assessment:     Pulmonary Function Assessment - 09/30/16 1458      Pulmonary Function Tests   FVC% 91 %   FEV1% 96 %   FEV1/FVC Ratio 71   RV% 75 %   DLCO% 67 %     Breath   Bilateral Breath Sounds Decreased;Clear   Shortness of Breath Yes;Limiting activity      Exercise Target Goals:    Exercise Program Goal: Individual exercise prescription set with THRR, safety & activity barriers. Participant demonstrates ability to understand and report RPE using BORG scale, to self-measure pulse accurately, and to acknowledge the importance of the exercise prescription.  Exercise Prescription Goal: Starting with aerobic activity 30 plus minutes a day, 3 days per week for initial exercise prescription. Provide home exercise prescription and guidelines that participant acknowledges understanding prior to discharge.  Activity  Barriers & Risk Stratification:     Activity Barriers & Cardiac Risk Stratification - 09/30/16 1457      Activity Barriers & Cardiac Risk Stratification   Activity Barriers Arthritis;Joint Problems;Shortness of Breath;Deconditioning;Muscular Weakness;History of Falls      6 Minute Walk:     6 Minute Walk    Row Name 09/30/16 1356 11/21/16 1226       6 Minute Walk   Phase  - Mid Program    Distance 845 feet 965 feet    Distance % Change  - 14.2 %  120 ft    Walk Time 6 minutes 6 minutes    # of Rest Breaks 0 0    MPH 1.6 1.83    METS 2.23 2.4    RPE 13 17    Perceived Dyspnea  3 3    VO2 Peak 3.16 3.69    Symptoms No Yes (comment)    Comments  - SOB    Resting HR 60 bpm 82 bpm    Resting BP 114/50 124/64    Max Ex. HR 95 bpm 102 bpm    Max Ex. BP 152/56 132/70    2 Minute Post BP  - 126/64      Interval HR   Baseline HR 60 82    1 Minute HR 94 95    2 Minute HR 93 95    3 Minute HR 95 94    4 Minute HR 95 95    5 Minute HR 95 95    6 Minute HR 95 102    2 Minute Post HR  - 93    Interval Heart Rate? Yes Yes      Interval Oxygen   Interval Oxygen? Yes Yes    Baseline Oxygen Saturation % 97 % 99 %    Baseline Liters of Oxygen  - 0 L  Room Air    1 Minute Oxygen Saturation % 95 % 94 %    1 Minute Liters of Oxygen  - 0 L    2 Minute Oxygen Saturation % 96 % 94 %    2 Minute Liters of Oxygen  - 0 L    3 Minute Oxygen Saturation % 96 % 94 %    3 Minute Liters of Oxygen  - 0 L    4 Minute Oxygen Saturation % 96 % 94 %    4 Minute Liters of Oxygen  - 0 L    5 Minute Oxygen Saturation % 96 % 94 %  5 Minute Liters of Oxygen  - 0 L    6 Minute Oxygen Saturation % 96 % 94 %    6 Minute Liters of Oxygen  - 0 L    2 Minute Post Oxygen Saturation %  - 96 %    2 Minute Post Liters of Oxygen  - 0 L      Oxygen Initial Assessment:   Oxygen Re-Evaluation:   Oxygen Discharge (Final Oxygen Re-Evaluation):   Initial Exercise Prescription:     Initial  Exercise Prescription - 09/30/16 1300      Date of Initial Exercise RX and Referring Provider   Date 09/30/16     Treadmill   MPH 1.4   Minutes 15   METs 2.07     NuStep   Level 2   Minutes 15   METs 2     T5 Nustep   Level 1   Minutes 15   METs 2     Biostep-RELP   Level 2   Minutes 15   METs 2     Prescription Details   Frequency (times per week) 3   Duration Progress to 45 minutes of aerobic exercise without signs/symptoms of physical distress     Intensity   THRR 40-80% of Max Heartrate 89-118   Ratings of Perceived Exertion 11-13   Perceived Dyspnea 0-4     Progression   Progression Continue to progress workloads to maintain intensity without signs/symptoms of physical distress.     Resistance Training   Training Prescription Yes   Weight 2   Reps 10-15      Perform Capillary Blood Glucose checks as needed.  Exercise Prescription Changes:     Exercise Prescription Changes    Row Name 10/06/16 1500 10/17/16 1300 10/22/16 1500 11/05/16 1500 11/19/16 1500     Response to Exercise   Blood Pressure (Admit) 110/64  - 124/64 112/64 146/80   Blood Pressure (Exercise)  -  - 118/66 126/70 134/82   Blood Pressure (Exit) 144/80  - 112/60 126/70 112/60   Heart Rate (Admit) 68 bpm  - 81 bpm 64 bpm 81 bpm   Heart Rate (Exercise) 92 bpm  - 66 bpm 89 bpm 95 bpm   Heart Rate (Exit) 59 bpm  - 62 bpm 63 bpm 65 bpm   Oxygen Saturation (Admit) 95 %  - 99 % 97 % 97 %   Oxygen Saturation (Exercise) 98 %  - 97 % 95 % 97 %   Oxygen Saturation (Exit) 99 %  - 96 % 95 % 98 %   Rating of Perceived Exertion (Exercise) 14  - _0 Perceived Dyspnea (Exercise) 3  - _1 Symptoms _2    Comments  - Home Exercise Guidelines given 10/17/16 Home Exercise Guidelines given 10/17/16 Home Exercise Guidelines given 10/17/16  -   Duration Progress to 45 minutes of aerobic exercise without signs/symptoms of physical distress Progress to 45 minutes of aerobic  exercise without signs/symptoms of physical distress Progress to 45 minutes of aerobic exercise without signs/symptoms of physical distress Progress to 45 minutes of aerobic exercise without signs/symptoms of physical distress Continue with 45 min of aerobic exercise without signs/symptoms of physical distress.   Intensity _3      Progression   Progression Continue to progress workloads to maintain intensity without signs/symptoms of physical distress. Continue to progress workloads to maintain intensity without  signs/symptoms of physical distress. Continue to progress workloads to maintain intensity without signs/symptoms of physical distress. Continue to progress workloads to maintain intensity without signs/symptoms of physical distress. Continue to progress workloads to maintain intensity without signs/symptoms of physical distress.   Average METs  -  - 2.09 2.16 2.43     Resistance Training   Training Prescription _0    Weight 2 2 Green Band 4 lbs 4 lbs   Reps 10-15 10-15 10-15 10-15 10-15     Interval Training   Interval Training  -  - No No No     Treadmill   MPH 1.4 1.4 1.4 1.4 2.2   Grade  -  - 0 0 0   Minutes 15  3/3/3 15  3/3/_1 min x3 15 15   METs  -  - 2.07 2.07 2.68     NuStep   Level  -  - _2 SPM  -  - -  78 spm  - 84   Minutes  -  - _3 METs  -  - 2.2 2.4 2.6     Biostep-RELP   Level _4 SPM  -  -  -  - 55   Minutes _5 METs _6 46 spm 2 2     Home Exercise Plan   Plans to continue exercise at  - Home  walking and bands Home  walking and bands Home  walking and bands Home (comment)  walking and bands   Frequency  - Add 1 additional day to program exercise sessions. Add 1 additional day to program exercise sessions. Add 1 additional day to program exercise sessions. Add 1 additional day to program exercise sessions.   Initial Home  Exercises Provided  -  -  -  - 10/17/16     Exercise Review   Progression -  first full day of exercise  - Yes Yes  -      Exercise Comments:     Exercise Comments    Row Name 10/06/16 1508 10/17/16 1336 10/22/16 1508 11/05/16 1531     Exercise Comments  First full day of exercise!  Patient was oriented to gym and equipment including functions, settings, policies, and procedures.  Patient's individual exercise prescription and treatment plan were reviewed.  All starting workloads were established based on the results of the 6 minute walk test done at initial orientation visit.  The plan for exercise progression was also introduced and progression will be customized based on patient's performance and goals. Reviewed home exercise with pt today.  Pt plans to walk and use bands at home for exercise.  Reviewed THR, pulse, RPE, sign and symptoms, and when to call 911 or MD.  Also discussed weather considerations and indoor options.  Pt voiced understanding. Tabetha is off to a good start with exercise.  She has already started to notice a difference.  We will continue to monitor her for progression. Belanna has been doing well in rehab.  She is already almost half way through the program.  Neda is up to level 4 on the BioStep.  We will continue to track her progression.       Exercise Goals and Review:   Exercise Goals Re-Evaluation :     Exercise Goals Re-Evaluation    Row Name 11/19/16 1540 11/21/16 1229  Exercise Goal Re-Evaluation   Exercise Goals Review Increase Physical Activity;Increase Strenth and Stamina Increase Physical Activity      Comments Mamta is officially half way through the program!  We will be doing her mid program 6MWT on Friday.  She also increased her treadmill again today to 2.2 mph!  We will continue to monitor her progression. [place] improved her walk test by 120 ft!!      Expected Outcomes Short: Yuvia will improve on her 6MWT.  Long: Cathern will continue to come  to to class to work on strength and stamina.  -         Discharge Exercise Prescription (Final Exercise Prescription Changes):     Exercise Prescription Changes - 11/19/16 1500      Response to Exercise   Blood Pressure (Admit) 146/80   Blood Pressure (Exercise) 134/82   Blood Pressure (Exit) 112/60   Heart Rate (Admit) 81 bpm   Heart Rate (Exercise) 95 bpm   Heart Rate (Exit) 65 bpm   Oxygen Saturation (Admit) 97 %   Oxygen Saturation (Exercise) 97 %   Oxygen Saturation (Exit) 98 %   Rating of Perceived Exertion (Exercise) 13   Perceived Dyspnea (Exercise) 3   Symptoms none   Duration Continue with 45 min of aerobic exercise without signs/symptoms of physical distress.   Intensity THRR unchanged     Progression   Progression Continue to progress workloads to maintain intensity without signs/symptoms of physical distress.   Average METs 2.43     Resistance Training   Training Prescription Yes   Weight 4 lbs   Reps 10-15     Interval Training   Interval Training No     Treadmill   MPH 2.2   Grade 0   Minutes 15   METs 2.68     NuStep   Level 4   SPM 84   Minutes 15   METs 2.6     Biostep-RELP   Level 3   SPM 55   Minutes 15   METs 2     Home Exercise Plan   Plans to continue exercise at Home (comment)  walking and bands   Frequency Add 1 additional day to program exercise sessions.   Initial Home Exercises Provided 10/17/16      Nutrition:  Target Goals: Understanding of nutrition guidelines, daily intake of sodium <1562m, cholesterol <2053m calories 30% from fat and 7% or less from saturated fats, daily to have 5 or more servings of fruits and vegetables.  Biometrics:     Pre Biometrics - 09/30/16 1356      Pre Biometrics   Height 5' 2.5" (1.588 m)   Weight 184 lb 6.4 oz (83.6 kg)   Waist Circumference 38.5 inches   Hip Circumference 48.25 inches   Waist to Hip Ratio 0.8 %   BMI (Calculated) 33.3       Nutrition Therapy Plan and  Nutrition Goals:   Nutrition Discharge: Rate Your Plate Scores:   Nutrition Goals Re-Evaluation:   Nutrition Goals Discharge (Final Nutrition Goals Re-Evaluation):   Psychosocial: Target Goals: Acknowledge presence or absence of significant depression and/or stress, maximize coping skills, provide positive support system. Participant is able to verbalize types and ability to use techniques and skills needed for reducing stress and depression.   Initial Review & Psychosocial Screening:     Initial Psych Review & Screening - 09/30/16 [place]  Comments Ms Flatley has good support from her children; 2 children live close and can help if Ms Knouff needs assistence. She states she is very independent and likes taking care of things herself. She is looking forward to [facility] and increasing her walking ability. Since she has a copay, she may switch to [employer] and use her [employer].     Barriers   Psychosocial barriers to participate in program The patient should benefit from training in stress management and relaxation.     Screening Interventions   Interventions Encouraged to exercise;Program counselor consult      Quality of Life Scores:   PHQ-9: Recent Review Flowsheet Data    Depression screen [facility] 2/9 09/30/2016   Decreased Interest 1   Down, Depressed, Hopeless 0   PHQ - 2 Score 1   Altered sleeping 3   Tired, decreased energy 3   Change in appetite 1   Feeling bad or failure about yourself  0   Trouble concentrating 1   Moving slowly or fidgety/restless 0   Suicidal thoughts 0   PHQ-9 Score 9   Difficult doing work/chores Very difficult     Interpretation of Total Score  Total Score Depression Severity:  1-4 = Minimal depression, 5-9 = Mild depression, 10-14 = Moderate depression, 15-19 = Moderately severe depression, 20-27 = Severe depression   Psychosocial Evaluation and Intervention:     Psychosocial  Evaluation - 10/06/16 1235      Psychosocial Evaluation & Interventions   Interventions Encouraged to exercise with the program and follow exercise prescription   Comments Counselor met with Zarianna today for initial psychosocial evaluation.  She is an 83 year old who has struggled with COPD for awhile now.  She lives alone but has (3) adult daughters who call and check on her everyday and Juhi is also actively involved in her local church.  Maelie has had multiple surgeries for shoulder, knee and hip replacements and has HBP as well.  She reports not sleeping well at all for a long time - and had a sleep study years ago for this.  Counselor assessed caffeine intake with Piper reporting drinks soft drinks up until 6PM at night.  Counselor encouraged decreasing the amount of soft drink intake and stop all caffeine priior to 3PM to help with getting to sleep better.  Tanette has a good appetite and denies a history or current symptoms of depression or anxiety.  She states she is typically in a positive mood and other than her health and her finances, she has minimal stress in her life.  She has goals for losing a little weight and being able to do some of her normal household activities without shortness of breath.  She would also like to "get off some of this expensive medication."  Counselor encouarged Naavya to exercise consistently and see if it helps sleep in addition to the decreased caffeine intake.  Staff will be following with Rod Holler throughout the course of this program.        Psychosocial Re-Evaluation:     Psychosocial Re-Evaluation    [place] Name 11/26/16 1303             Psychosocial Re-Evaluation   Current issues with None Identified       Comments Nickcole loves to talk about her family, especially her grandchildren, young grandchildren and adult grandchildren.  She finds joy in helping them and likes to stay involved in their lives.  She states that she  also loves to relax by going and spending time  with an elderly neighbor.  Her neighbors are good about "keeping an eye out" for her and her family checks on her multiple times a day.  She seems to be in good spirits with little stress concerns.         Interventions Encouraged to attend Pulmonary Rehabilitation for the exercise       Continue Psychosocial Services  Follow up required by staff          Psychosocial Discharge (Final Psychosocial Re-Evaluation):     Psychosocial Re-Evaluation - 11/26/16 1303      Psychosocial Re-Evaluation   Current issues with None Identified   Comments Giulliana loves to talk about her family, especially her grandchildren, young grandchildren and adult grandchildren.  She finds joy in helping them and likes to stay involved in their lives.  She states that she also loves to relax by going and spending time with an elderly neighbor.  Her neighbors are good about "keeping an eye out" for her and her family checks on her multiple times a day.  She seems to be in good spirits with little stress concerns.     Interventions Encouraged to attend Pulmonary Rehabilitation for the exercise   Continue Psychosocial Services  Follow up required by staff      Education: Education Goals: Education classes will be provided on a weekly basis, covering required topics. Participant will state understanding/return demonstration of topics presented.  Learning Barriers/Preferences:     Learning Barriers/Preferences - 09/30/16 1458      Learning Barriers/Preferences   Learning Barriers None   Learning Preferences None      Education Topics: Initial Evaluation Education: - Verbal, written and demonstration of respiratory meds, RPE/PD scales, oximetry and breathing techniques. Instruction on use of nebulizers and MDIs: cleaning and proper use, rinsing mouth with steroid doses and importance of monitoring MDI activations. Flowsheet Row Pulmonary Rehab from 11/26/2016 in [facility] Cardiac and Pulmonary Rehab  Date  09/30/16   Educator  LB  Instruction Review Code  2- meets goals/outcomes      General Nutrition Guidelines/Fats and Fiber: -Group instruction provided by verbal, written material, models and posters to present the general guidelines for heart healthy nutrition. Gives an explanation and review of dietary fats and fiber. Flowsheet Row Pulmonary Rehab from 11/26/2016 in [facility] Cardiac and Pulmonary Rehab  Date  11/24/16  Educator  CR  Instruction Review Code  2- meets goals/outcomes      Controlling Sodium/Reading Food Labels: -Group verbal and written material supporting the discussion of sodium use in heart healthy nutrition. Review and explanation with models, verbal and written materials for utilization of the food label. Flowsheet Row Pulmonary Rehab from 11/26/2016 in [facility] Cardiac and Pulmonary Rehab  Date  10/13/16  Educator  CR  Instruction Review Code  2- meets goals/outcomes      Exercise Physiology & Risk Factors: - Group verbal and written instruction with models to review the exercise physiology of the cardiovascular system and associated critical values. Details cardiovascular disease risk factors and the goals associated with each risk factor.   Aerobic Exercise & Resistance Training: - Gives group verbal and written discussion on the health impact of inactivity. On the components of aerobic and resistive training programs and the benefits of this training and how to safely progress through these programs. Flowsheet Row Pulmonary Rehab from 11/26/2016 in [facility] Cardiac and Pulmonary Rehab  Date  10/22/16  Educator  [facility]  Instruction Review Code  2- meets goals/outcomes      Flexibility, Balance, General Exercise Guidelines: - Provides group verbal and written instruction on the benefits of flexibility and balance training programs. Provides general exercise guidelines with specific guidelines to those with heart or lung disease. Demonstration and skill practice provided. Flowsheet Row  Pulmonary Rehab from 11/26/2016 in [facility] Cardiac and Pulmonary Rehab  Date  11/14/16  Educator  AS  Instruction Review Code  2- meets goals/outcomes      Stress Management: - Provides group verbal and written instruction about the health risks of elevated stress, cause of high stress, and healthy ways to reduce stress.   Depression: - Provides group verbal and written instruction on the correlation between heart/lung disease and depressed mood, treatment options, and the stigmas associated with seeking treatment. Flowsheet Row Pulmonary Rehab from 11/26/2016 in [facility] Cardiac and Pulmonary Rehab  Date  11/26/16  Educator  [facility]  Instruction Review Code  2- meets goals/outcomes      Exercise & Equipment Safety: - Individual verbal instruction and demonstration of equipment use and safety with use of the equipment. Flowsheet Row Pulmonary Rehab from 11/26/2016 in [facility] Cardiac and Pulmonary Rehab  Date  10/06/16  Educator  KH/AS  Instruction Review Code  2- meets goals/outcomes      Infection Prevention: - Provides verbal and written material to individual with discussion of infection control including proper hand washing and proper equipment cleaning during exercise session. Flowsheet Row Pulmonary Rehab from 11/26/2016 in [facility] Cardiac and Pulmonary Rehab  Date  10/06/16  Educator  KH/AS  Instruction Review Code  2- meets goals/outcomes      Falls Prevention: - Provides verbal and written material to individual with discussion of falls prevention and safety. Flowsheet Row Pulmonary Rehab from 11/26/2016 in [facility] Cardiac and Pulmonary Rehab  Date  09/30/16  Educator  LB  Instruction Review Code  2- meets goals/outcomes      Diabetes: - Individual verbal and written instruction to review signs/symptoms of diabetes, desired ranges of glucose level fasting, after meals and with exercise. Advice that pre and post exercise glucose checks will be done for 3 sessions at entry of  program.   Chronic Lung Diseases: - Group verbal and written instruction to review new updates, new respiratory medications, new advancements in procedures and treatments. Provide informative websites and "800" numbers of self-education. Flowsheet Row Pulmonary Rehab from 11/26/2016 in [facility] Cardiac and Pulmonary Rehab  Date  10/15/16  Educator  LB  Instruction Review Code  2- meets goals/outcomes      Lung Procedures: - Group verbal and written instruction to describe testing methods done to diagnose lung disease. Review the outcome of test results. Describe the treatment choices: Pulmonary Function Tests, ABGs and oximetry.   Energy Conservation: - Provide group verbal and written instruction for methods to conserve energy, plan and organize activities. Instruct on pacing techniques, use of adaptive equipment and posture/positioning to relieve shortness of breath. Flowsheet Row Pulmonary Rehab from 11/26/2016 in [facility] Cardiac and Pulmonary Rehab  Date  11/19/16  Educator  [facility]  Instruction Review Code  2- meets goals/outcomes      Triggers: - Group verbal and written instruction to review types of environmental controls: home humidity, furnaces, filters, dust mite/pet prevention, HEPA vacuums. To discuss weather changes, air quality and the benefits of nasal washing. Flowsheet Row Pulmonary Rehab from 11/26/2016 in [facility] Cardiac and Pulmonary Rehab  Date  11/12/16  Educator  LB  Instruction Review  Code  2- meets goals/outcomes      Exacerbations: - Group verbal and written instruction to provide: warning signs, infection symptoms, calling MD promptly, preventive modes, and value of vaccinations. Review: effective airway clearance, coughing and/or vibration techniques. Create an Sports administrator. Flowsheet Row Pulmonary Rehab from 11/26/2016 in [facility] Cardiac and Pulmonary Rehab  Date  11/05/16  Educator  LB  Instruction Review Code  2- meets goals/outcomes      Oxygen: - Individual and  group verbal and written instruction on oxygen therapy. Includes supplement oxygen, available portable oxygen systems, continuous and intermittent flow rates, oxygen safety, concentrators, and Medicare reimbursement for oxygen.   Respiratory Medications: - Group verbal and written instruction to review medications for lung disease. Drug class, frequency, complications, importance of spacers, rinsing mouth after steroid MDI's, and proper cleaning methods for nebulizers. Flowsheet Row Pulmonary Rehab from 11/26/2016 in [facility] Cardiac and Pulmonary Rehab  Date  09/30/16  Educator  LB  Instruction Review Code  2- meets goals/outcomes      AED/CPR: - Group verbal and written instruction with the use of models to demonstrate the basic use of the AED with the basic ABC's of resuscitation.   Breathing Retraining: - Provides individuals verbal and written instruction on purpose, frequency, and proper technique of diaphragmatic breathing and pursed-lipped breathing. Applies individual practice skills. Flowsheet Row Pulmonary Rehab from 11/26/2016 in [facility] Cardiac and Pulmonary Rehab  Date  09/30/16  Educator  LB  Instruction Review Code  2- meets goals/outcomes      Anatomy and Physiology of the Lungs: - Group verbal and written instruction with the use of models to provide basic lung anatomy and physiology related to function, structure and complications of lung disease.   Heart Failure: - Group verbal and written instruction on the basics of heart failure: signs/symptoms, treatments, explanation of ejection fraction, enlarged heart and cardiomyopathy.   Sleep Apnea: - Individual verbal and written instruction to review Obstructive Sleep Apnea. Review of risk factors, methods for diagnosing and types of masks and machines for OSA.   Anxiety: - Provides group, verbal and written instruction on the correlation between heart/lung disease and anxiety, treatment options, and management of  anxiety.   Relaxation: - Provides group, verbal and written instruction about the benefits of relaxation for patients with heart/lung disease. Also provides patients with examples of relaxation techniques. Flowsheet Row Pulmonary Rehab from 11/26/2016 in [facility] Cardiac and Pulmonary Rehab  Date  10/29/16  Educator  [facility]  Instruction Review Code  2- Meets goals/outcomes      Knowledge Questionnaire Score:     Knowledge Questionnaire Score - 09/30/16 1458      Knowledge Questionnaire Score   Pre Score 5/10       Core Components/Risk Factors/Patient Goals at Admission:     Personal Goals and Risk Factors at Admission - 09/30/16 1502      Core Components/Risk Factors/Patient Goals on Admission   Sedentary Yes  Goal: increase walking; walks her dog in the back yard around the fence line.   Intervention Provide advice, education, support and counseling about physical activity/exercise needs.;Develop an individualized exercise prescription for aerobic and resistive training based on initial evaluation findings, risk stratification, comorbidities and participant's personal goals.   Expected Outcomes Achievement of increased cardiorespiratory fitness and enhanced flexibility, muscular endurance and strength shown through measurements of functional capacity and personal statement of participant.   Increase Strength and Stamina Yes   Intervention Provide advice, education, support and counseling about physical activity/exercise  needs.;Develop an individualized exercise prescription for aerobic and resistive training based on initial evaluation findings, risk stratification, comorbidities and participant's personal goals.   Expected Outcomes Achievement of increased cardiorespiratory fitness and enhanced flexibility, muscular endurance and strength shown through measurements of functional capacity and personal statement of participant.   Improve shortness of breath with ADL's Yes   Intervention  Provide education, individualized exercise plan and daily activity instruction to help decrease symptoms of SOB with activities of daily living.   Expected Outcomes Short Term: Achieves a reduction of symptoms when performing activities of daily living.   Develop more efficient breathing techniques such as purse lipped breathing and diaphragmatic breathing; and practicing self-pacing with activity Yes   Intervention Provide education, demonstration and support about specific breathing techniuqes utilized for more efficient breathing. Include techniques such as pursed lipped breathing, diaphragmatic breathing and self-pacing activity.   Expected Outcomes Short Term: Participant will be able to demonstrate and use breathing techniques as needed throughout daily activities.   Increase knowledge of respiratory medications and ability to use respiratory devices properly  Yes  Symbicort, Spiriva, Proventil, Spacer given; Duoneb   Intervention Provide education and demonstration as needed of appropriate use of medications, inhalers, and oxygen therapy.   Expected Outcomes Short Term: Achieves understanding of medications use. Understands that oxygen is a medication prescribed by physician. Demonstrates appropriate use of inhaler and oxygen therapy.   Hypertension Yes   Intervention Provide education on lifestyle modifcations including regular physical activity/exercise, weight management, moderate sodium restriction and increased consumption of fresh fruit, vegetables, and low fat dairy, alcohol moderation, and smoking cessation.;Monitor prescription use compliance.   Expected Outcomes Short Term: Continued assessment and intervention until BP is < 140/43m HG in hypertensive participants. < 130/827mHG in hypertensive participants with diabetes, heart failure or chronic kidney disease.;Long Term: Maintenance of blood pressure at goal levels.   Lipids Yes   Intervention Provide education and support for  participant on nutrition & aerobic/resistive exercise along with prescribed medications to achieve LDL <7053mHDL >74m81m Expected Outcomes Short Term: Participant states understanding of desired cholesterol values and is compliant with medications prescribed. Participant is following exercise prescription and nutrition guidelines.;Long Term: Cholesterol controlled with medications as prescribed, with individualized exercise RX and with personalized nutrition plan. Value goals: LDL < 70mg31mL > 40 mg.      Core Components/Risk Factors/Patient Goals Review:      Goals and Risk Factor Review    Row Name 10/21/16 0616 11/10/16 1507           Core Components/Risk Factors/Patient Goals Review   Personal Goals Review Sedentary;Increase Strength and Stamina;Improve shortness of breath with ADL's;Develop more efficient breathing techniques such as purse lipped breathing and diaphragmatic breathing and practicing self-pacing with activity.;Increase knowledge of respiratory medications and ability to use respiratory devices properly.;Hypertension;Lipids Weight Management/Obesity;Sedentary;Increase Strength and Stamina;Improve shortness of breath with ADL's;Develop more efficient breathing techniques such as purse lipped breathing and diaphragmatic breathing and practicing self-pacing with activity.;Increase knowledge of respiratory medications and ability to use respiratory devices properly.;Hypertension;Lipids      Review Ms BrandFelixrogressing with her exercise goals. She is now exercising 15 minutes on the BioS and NS. The treadmill is difficult for her, so she in doing set work and plans to increase her set times to 4 mins her next session. She uses PLB without cueing and manages her activities at home with pacing. A big concern for her is the cost of her inhalers. I  have talked with her about switching to her nebulizer which Medicare Part D helps pay for these drugs. Ms Neuwirth has been using the  spacer I gave her for her Proventil. She is now considering meeting with the dietitian. Her blood pressures are acceptable and she is compiant with her BP and Lipid drugs. Ms Lover has increased her exercise goals and is now performing 15 minutes on the treadmill. Her physician appointment recently went well with a good report on her lipids and her blood pressure. Due to shaking from the Duoneb SVN, Mr Sadik requested her medication changed to Xopenex, as we discussed in [place]. Ms Suen is enjoying exercise and is planning to exercise at her son's apartment gym. on the days off from [place]. She still has increased shortness of breath, but she has noticed an improvement in house chores, such as vacuming. She has good technique with her PLB. Ms Engh attends regularly and enjoys the education.      Expected Outcomes Continue progressing with her exercise and continue working with her on her respiratory medications. Continue progressing with her exercise goals and add 1 to 2 extra days of exercise.         Core Components/Risk Factors/Patient Goals at Discharge (Final Review):      Goals and Risk Factor Review - 11/10/16 1507      Core Components/Risk Factors/Patient Goals Review   Personal Goals Review Weight Management/Obesity;Sedentary;Increase Strength and Stamina;Improve shortness of breath with ADL's;Develop more efficient breathing techniques such as purse lipped breathing and diaphragmatic breathing and practicing self-pacing with activity.;Increase knowledge of respiratory medications and ability to use respiratory devices properly.;Hypertension;Lipids   Review Ms Kesecker has increased her exercise goals and is now performing 15 minutes on the treadmill. Her physician appointment recently went well with a good report on her lipids and her blood pressure. Due to shaking from the Duoneb SVN, Mr Beckstrom requested her medication changed to Xopenex, as we discussed in [place]. Ms  Algeo is enjoying exercise and is planning to exercise at her son's apartment gym. on the days off from [place]. She still has increased shortness of breath, but she has noticed an improvement in house chores, such as vacuming. She has good technique with her PLB. Ms Salvador attends regularly and enjoys the education.   Expected Outcomes Continue progressing with her exercise goals and add 1 to 2 extra days of exercise.      ITP Comments:     ITP Comments    Row Name 09/30/16 1603 10/24/16 1131         ITP Comments  Ms Olsson plans to start [facility] on 10/06/16 and attend 2-3 days/week. She does have a co-pay and has [employer], so Ms Sugarman may switch to the [employer] after several weeks. Attended [employer] Education Class         Comments: 30 Day Review

## 2016-12-03 ENCOUNTER — Encounter: Payer: PPO | Admitting: *Deleted

## 2016-12-03 DIAGNOSIS — I482 Chronic atrial fibrillation: Secondary | ICD-10-CM | POA: Diagnosis not present

## 2016-12-03 DIAGNOSIS — J449 Chronic obstructive pulmonary disease, unspecified: Secondary | ICD-10-CM

## 2016-12-03 NOTE — Progress Notes (Signed)
Daily Session Note  Patient Details  Name: [name] [name] MRN: [id number] Date of Birth: [birth year] Referring Provider:    Encounter Date: 12/03/2016  Check In:     Session Check In - 12/03/16 1212      Check-In   Location [facility]-Cardiac & Pulmonary Rehab   Staff Present Alberteen Sam, MA, ACSM RCEP, Exercise Physiologist;Other;Laureen Janell Quiet, RRT, Respiratory Therapist  Darel Hong RN BSN   Supervising physician immediately available to respond to emergencies [facility] immediately available ER MD   Physician(s) Drs. Marcelene Butte and Darl Householder   Medication changes reported     No   Fall or balance concerns reported    No   Warm-up and Cool-down Performed as group-led Location manager Performed Yes   VAD Patient? No     Pain Assessment   Currently in Pain? No/denies           Exercise Prescription Changes - 12/02/16 1600      Response to Exercise   Blood Pressure (Admit) 124/60   Blood Pressure (Exercise) 126/74   Blood Pressure (Exit) 124/64   Heart Rate (Admit) 67 bpm   Heart Rate (Exercise) 69 bpm   Heart Rate (Exit) 63 bpm   Oxygen Saturation (Admit) 96 %   Oxygen Saturation (Exercise) 94 %   Oxygen Saturation (Exit) 94 %   Rating of Perceived Exertion (Exercise) 14   Perceived Dyspnea (Exercise) 3   Symptoms none   Duration Continue with 45 min of aerobic exercise without signs/symptoms of physical distress.   Intensity THRR unchanged     Progression   Progression Continue to progress workloads to maintain intensity without signs/symptoms of physical distress.   Average METs 2.59     Resistance Training   Training Prescription Yes   Weight 4 lbs   Reps 10-15     Interval Training   Interval Training No     Treadmill   MPH 1.4   Grade 0   Minutes 15   METs 2.07     NuStep   Level 4   SPM 93   Minutes 15   METs 3.7     Biostep-RELP   Level 12   SPM 34   Minutes 15   METs 2     Home Exercise Plan   Plans to continue exercise  at Home (comment)  walking and bands   Frequency Add 2 additional days to program exercise sessions.   Initial Home Exercises Provided 10/17/16      History  Smoking Status  . Former Smoker  . Quit date: 12/05/1986  Smokeless Tobacco  . Never Used    Goals Met:  Proper associated with RPD/PD & O2 Sat Independence with exercise equipment Using PLB without cueing & demonstrates good technique Exercise tolerated well Strength training completed today  Goals Unmet:  Not Applicable  Comments: Pt able to follow exercise prescription today without complaint.  Will continue to monitor for progression.    Dr. Emily Filbert is Medical Director for [place] and [facility]monary [facility].

## 2016-12-05 ENCOUNTER — Encounter: Payer: PPO | Admitting: *Deleted

## 2016-12-05 DIAGNOSIS — J449 Chronic obstructive pulmonary disease, unspecified: Secondary | ICD-10-CM | POA: Diagnosis not present

## 2016-12-05 NOTE — Progress Notes (Signed)
Daily Session Note  Patient Details  Name: KLOEE BALLEW MRN: [id number] Date of Birth: 12-04-1926 Referring Provider:    Encounter Date: 12/05/2016  Check In:     Session Check In - 12/05/16 1223      Check-In   Location [facility]-Cardiac & Pulmonary Rehab   Staff Present Gerlene Burdock, RN, BSN;Patricia Surles RN BSN;Other  Daleen Snook RN   Supervising physician immediately available to respond to emergencies [facility] immediately available ER MD   Physician(s) Dr. Mariea Clonts and Dr. Corky Downs   Medication changes reported     No   Fall or balance concerns reported    No   Warm-up and Cool-down Performed on first and last piece of equipment   Resistance Training Performed Yes   VAD Patient? No     Pain Assessment   Currently in Pain? No/denies         History  Smoking Status  . Former Smoker  . Quit date: 12/05/1986  Smokeless Tobacco  . Never Used    Goals Met:  Proper associated with RPD/PD & O2 Sat Exercise tolerated well  Goals Unmet:  Not Applicable  Comments:     Dr. Emily Filbert is Medical Director for [place] and [facility].

## 2016-12-08 DIAGNOSIS — J449 Chronic obstructive pulmonary disease, unspecified: Secondary | ICD-10-CM | POA: Diagnosis not present

## 2016-12-08 NOTE — Progress Notes (Signed)
Daily Session Note  Patient Details  Name: [name] [name] MRN: [id number] Date of Birth: Sep 06, 1927 Referring Provider:    Encounter Date: 12/08/2016  Check In:     Session Check In - 12/08/16 1237      Check-In   Location [facility]-Cardiac & Pulmonary Rehab   Staff Present Carson Myrtle, BS, RRT, Respiratory Therapist;Kelly Amedeo Plenty, BS, ACSM CEP, Exercise Physiologist;Keyshaun Exley Oletta Darter, BA, ACSM CEP, Exercise Physiologist   Supervising physician immediately available to respond to emergencies [facility] immediately available ER MD   Physician(s) Reita Cliche and Alfred Levins   Medication changes reported     No   Fall or balance concerns reported    No   Warm-up and Cool-down Performed as group-led instruction   Resistance Training Performed Yes   VAD Patient? No         History  Smoking Status  . Former Smoker  . Quit date: 12/05/1986  Smokeless Tobacco  . Never Used    Goals Met:  Proper associated with RPD/PD & O2 Sat Independence with exercise equipment Exercise tolerated well Strength training completed today  Goals Unmet:  Not Applicable  Comments: Pt able to follow exercise prescription today without complaint.  Will continue to monitor for progression.    Dr. Emily Filbert is Medical Director for [place] and [facility]monary [facility].

## 2016-12-10 ENCOUNTER — Encounter: Payer: PPO | Admitting: Respiratory Therapy

## 2016-12-10 DIAGNOSIS — J449 Chronic obstructive pulmonary disease, unspecified: Secondary | ICD-10-CM

## 2016-12-10 NOTE — Progress Notes (Signed)
Daily Session Note  Patient Details  Name: [name] [name] MRN: [id number] Date of Birth: 1926/12/09 Referring Provider:    Encounter Date: 12/10/2016  Check In:     Session Check In - 12/10/16 1230      Check-In   Location [facility]-Cardiac & Pulmonary Rehab   Staff Present Carson Myrtle, BS, RRT, Respiratory Lennie Hummer, MA, ACSM RCEP, Exercise Physiologist   Supervising physician immediately available to respond to emergencies [facility] immediately available ER MD   Physician(s) Drs. Paduchowski and Williams   Medication changes reported     No   Fall or balance concerns reported    No   Warm-up and Cool-down Performed as group-led Location manager Performed Yes   VAD Patient? No     Pain Assessment   Currently in Pain? No/denies   Multiple Pain Sites No         History  Smoking Status   Former Smoker   Quit date: 12/05/1986  Smokeless Tobacco   Never Used    Goals Met:  Proper associated with RPD/PD & O2 Sat Independence with exercise equipment Using PLB without cueing & demonstrates good technique Exercise tolerated well Personal goals reviewed No report of cardiac concerns or symptoms Strength training completed today  Goals Unmet:  Not Applicable  Comments: Pt able to follow exercise prescription today without complaint.  Will continue to monitor for progression.   Dr. Emily Filbert is Medical Director for [place] and [facility].

## 2016-12-12 ENCOUNTER — Encounter: Payer: PPO | Admitting: *Deleted

## 2016-12-12 DIAGNOSIS — J449 Chronic obstructive pulmonary disease, unspecified: Secondary | ICD-10-CM

## 2016-12-12 NOTE — Progress Notes (Signed)
Daily Session Note  Patient Details  Name: ANALEIGHA NAUMAN MRN: [id number] Date of Birth: 09-12-1927 Referring Provider:    Encounter Date: 12/12/2016  Check In:     Session Check In - 12/12/16 1202      Check-In   Location [facility]-Cardiac & Pulmonary Rehab   Staff Present Nyoka Cowden, RN, BSN, Walden Field, BS, RRT, Respiratory Therapist  Darel Hong RN BSN   Supervising physician immediately available to respond to emergencies [facility] immediately available ER MD   Physician(s) Drs. Alfred Levins and [place]   Medication changes reported     No   Fall or balance concerns reported    No   Tobacco Cessation No Change   Warm-up and Cool-down Performed as group-led Location manager Performed Yes   VAD Patient? No     Pain Assessment   Currently in Pain? No/denies         History  Smoking Status  . Former Smoker  . Quit date: 12/05/1986  Smokeless Tobacco  . Never Used    Goals Met:  Proper associated with RPD/PD & O2 Sat Independence with exercise equipment Using PLB without cueing & demonstrates good technique Exercise tolerated well Strength training completed today  Goals Unmet:  Not Applicable  Comments: Pt able to follow exercise prescription today without complaint.  Will continue to monitor for progression.    Dr. Emily Filbert is Medical Director for [place] and [facility].

## 2016-12-15 DIAGNOSIS — J449 Chronic obstructive pulmonary disease, unspecified: Secondary | ICD-10-CM

## 2016-12-15 NOTE — Progress Notes (Signed)
Daily Session Note  Patient Details  Name: [name] [name] MRN: [id number] Date of Birth: [birth year] Referring Provider:    Encounter Date: 12/15/2016  Check In:     Session Check In - 12/15/16 1433      Check-In   Location [facility]-Cardiac & Pulmonary Rehab   Staff Present Carson Myrtle, BS, RRT, Respiratory Therapist;Kelly Amedeo Plenty, BS, ACSM CEP, Exercise Physiologist;Ianna Salmela Oletta Darter, BA, ACSM CEP, Exercise Physiologist   Supervising physician immediately available to respond to emergencies [facility] immediately available ER MD   Physician(s) Clearnce Hasten and Jimmye Norman   Medication changes reported     No   Fall or balance concerns reported    No   Warm-up and Cool-down Performed as group-led instruction   Resistance Training Performed Yes   VAD Patient? No         History  Smoking Status  . Former Smoker  . Quit date: 12/05/1986  Smokeless Tobacco  . Never Used    Goals Met:  Proper associated with RPD/PD & O2 Sat Independence with exercise equipment Exercise tolerated well Strength training completed today  Goals Unmet:  Not Applicable  Comments: Pt able to follow exercise prescription today without complaint.  Will continue to monitor for progression.    Dr. Emily Filbert is Medical Director for [place] and [facility].

## 2016-12-17 ENCOUNTER — Encounter: Payer: PPO | Admitting: *Deleted

## 2016-12-17 DIAGNOSIS — J449 Chronic obstructive pulmonary disease, unspecified: Secondary | ICD-10-CM | POA: Diagnosis not present

## 2016-12-17 NOTE — Progress Notes (Signed)
Daily Session Note  Patient Details  Name: TIFFINIE CAILLIER MRN: [id number] Date of Birth: [birth year] Referring Provider:    Encounter Date: 12/17/2016  Check In:     Session Check In - 12/17/16 1410      Check-In   Location [facility]-Cardiac & Pulmonary Rehab   Staff Present Alberteen Sam, MA, ACSM RCEP, Exercise Physiologist;Mary Kellie Shropshire, RN, BSN, Walden Field, BS, RRT, Respiratory Therapist   Supervising physician immediately available to respond to emergencies [facility] immediately available ER MD   Physician(s) Drs. Paduchowski and Williams   Medication changes reported     No   Fall or balance concerns reported    No   Warm-up and Cool-down Performed on first and last piece of equipment   Resistance Training Performed Yes   VAD Patient? No     Pain Assessment   Currently in Pain? No/denies   Multiple Pain Sites No         History  Smoking Status  . Former Smoker  . Quit date: 12/05/1986  Smokeless Tobacco  . Never Used    Goals Met:  Proper associated with RPD/PD & O2 Sat Independence with exercise equipment Using PLB without cueing & demonstrates good technique Exercise tolerated well Strength training completed today  Goals Unmet:  Not Applicable  Comments: Pt able to follow exercise prescription today without complaint.  Will continue to monitor for progression.    Dr. Emily Filbert is Medical Director for [place] and [facility].

## 2016-12-19 ENCOUNTER — Encounter: Payer: PPO | Admitting: *Deleted

## 2016-12-19 DIAGNOSIS — J449 Chronic obstructive pulmonary disease, unspecified: Secondary | ICD-10-CM | POA: Diagnosis not present

## 2016-12-19 NOTE — Progress Notes (Signed)
Daily Session Note  Patient Details  Name: [name] [name] MRN: [id number] Date of Birth: 27-Sep-1926 Referring Provider:    Encounter Date: 12/19/2016  Check In:     Session Check In - 12/19/16 1230      Check-In   Location [facility]-Cardiac & Pulmonary Rehab   Staff Present Alberteen Sam, MA, ACSM RCEP, Exercise Physiologist;Mary Kellie Shropshire, RN, BSN, MA   Supervising physician immediately available to respond to emergencies [facility] immediately available ER MD   Physician(s) Drs. Malinda and Lord   Medication changes reported     No   Fall or balance concerns reported    No   Warm-up and Cool-down Performed as group-led Location manager Performed Yes   VAD Patient? No     Pain Assessment   Currently in Pain? No/denies   Multiple Pain Sites No         History  Smoking Status  . Former Smoker  . Quit date: 12/05/1986  Smokeless Tobacco  . Never Used    Goals Met:  Independence with exercise equipment Exercise tolerated well No report of cardiac concerns or symptoms Strength training completed today  Goals Unmet:  Not Applicable  Comments: Pt able to follow exercise prescription today without complaint.  Will continue to monitor for progression.    Dr. Emily Filbert is Medical Director for [place] and [facility].

## 2016-12-22 ENCOUNTER — Encounter: Payer: PPO | Attending: Internal Medicine

## 2016-12-22 VITALS — Ht 62.5 in | Wt 179.0 lb

## 2016-12-22 DIAGNOSIS — J449 Chronic obstructive pulmonary disease, unspecified: Secondary | ICD-10-CM | POA: Insufficient documentation

## 2016-12-22 NOTE — Progress Notes (Signed)
Daily Session Note  Patient Details  Name: [name] [name] MRN: [id number] Date of Birth: [birth year] Referring Provider:    Encounter Date: 12/22/2016  Check In:     Session Check In - 12/22/16 1249      Check-In   Location [facility]   Staff Present Carson Myrtle, BS, RRT, Respiratory Bertis Ruddy, BS, ACSM CEP, Exercise Physiologist;Amanda Oletta Darter, BA, ACSM CEP, Exercise Physiologist   Supervising physician immediately available to respond to emergencies [facility] immediately available ER MD   Physician(s) Quentin Cornwall and Jimmye Norman   Medication changes reported     No   Fall or balance concerns reported    No   Warm-up and Cool-down Performed as group-led instruction   Resistance Training Performed Yes   VAD Patient? No     Pain Assessment   Currently in Pain? No/denies         History  Smoking Status  . Former Smoker  . Quit date: 12/05/1986  Smokeless Tobacco  . Never Used    Goals Met:  Proper associated with RPD/PD & O2 Sat Independence with exercise equipment Exercise tolerated well Strength training completed today  Goals Unmet:  Not Applicable  Comments:      [place] Name 09/30/16 1356 11/21/16 1226 12/22/16 1251     6 Minute Walk   Phase  - Mid Program Discharge   Distance 845 feet 965 feet 1125 feet   Distance % Change  - 14.2 %  120 ft 33 %   Walk Time 6 minutes 6 minutes 6 minutes   # of Rest Breaks 0 0 0   MPH 1.6 1.83 2.13   METS 2.23 2.4 2.61   RPE _0 Perceived Dyspnea  _1 VO2 Peak 3.16 3.69 5.54   Symptoms No Yes (comment) No   Comments  - SOB  -   Resting HR 60 bpm 82 bpm 77 bpm   Resting BP 114/50 124/64 142/78   Max Ex. HR 95 bpm 102 bpm 100 bpm   Max Ex. BP 152/56 132/70 164/74   2 Minute Post BP  - 126/64  -     Interval HR   Baseline HR 60 82 77   1 Minute HR 94 95 86   2 Minute HR 93 95 78   3 Minute HR 95 94 100   4 Minute HR 95 95 97   5 Minute HR 95 95 97   6  Minute HR 95 102 96   2 Minute Post HR  - 93  -   Interval Heart Rate? Yes Yes Yes     Interval Oxygen   Interval Oxygen? Yes Yes Yes   Baseline Oxygen Saturation % 97 % 99 % 98 %   Baseline Liters of Oxygen  - 0 L  Room Air  -   1 Minute Oxygen Saturation % 95 % 94 % 96 %   1 Minute Liters of Oxygen  - 0 L  -   2 Minute Oxygen Saturation % 96 % 94 % 94 %   2 Minute Liters of Oxygen  - 0 L  -   3 Minute Oxygen Saturation % 96 % 94 % 96 %   3 Minute Liters of Oxygen  - 0 L  -   4 Minute Oxygen Saturation % 96 % 94 % 97 %   4 Minute Liters of Oxygen  - 0  L  -   5 Minute Oxygen Saturation % 96 % 94 % 96 %   5 Minute Liters of Oxygen  - 0 L  -   6 Minute Oxygen Saturation % 96 % 94 % 96 %   6 Minute Liters of Oxygen  - 0 L  -   2 Minute Post Oxygen Saturation %  - 96 %  -   2 Minute Post Liters of Oxygen  - 0 L  -        Dr. Emily Filbert is Medical Director for [place] and [facility].

## 2016-12-24 ENCOUNTER — Encounter: Payer: PPO | Admitting: *Deleted

## 2016-12-24 DIAGNOSIS — J449 Chronic obstructive pulmonary disease, unspecified: Secondary | ICD-10-CM

## 2016-12-24 NOTE — Progress Notes (Signed)
Daily Session Note  Patient Details  Name: [name] [name] MRN: [id number] Date of Birth: 01/13/1927 Referring Provider:    Encounter Date: 12/24/2016  Check In:     Session Check In - 12/24/16 1125      Check-In   Location [facility]   Staff Present Alberteen Sam, MA, ACSM RCEP, Exercise Physiologist;Laureen Owens Shark, BS, RRT, Respiratory Therapist;Carroll Enterkin, RN, BSN   Supervising physician immediately available to respond to emergencies [facility] immediately available ER MD   Physician(s) Drs. Lord and [place]   Medication changes reported     No   Fall or balance concerns reported    No   Warm-up and Cool-down Performed as group-led Location manager Performed Yes   VAD Patient? No     Pain Assessment   Currently in Pain? No/denies   Multiple Pain Sites No         History  Smoking Status  . Former Smoker  . Quit date: 12/05/1986  Smokeless Tobacco  . Never Used    Goals Met:  Proper associated with RPD/PD & O2 Sat Independence with exercise equipment Using PLB without cueing & demonstrates good technique Exercise tolerated well Strength training completed today  Goals Unmet:  Not Applicable  Comments: Pt able to follow exercise prescription today without complaint.  Will continue to monitor for progression.    Dr. Emily Filbert is Medical Director for [place] and [facility].

## 2016-12-26 ENCOUNTER — Encounter: Payer: PPO | Admitting: *Deleted

## 2016-12-26 DIAGNOSIS — J449 Chronic obstructive pulmonary disease, unspecified: Secondary | ICD-10-CM

## 2016-12-26 NOTE — Progress Notes (Addendum)
Daily Session Note  Patient Details  Name: [name] [name] MRN: [id number] Date of Birth: 08/08/1927 Referring Provider:    Encounter Date: 12/26/2016  Check In:     Session Check In - 12/26/16 1134      Check-In   Location [facility]   Staff Present Alberteen Sam, MA, ACSM RCEP, Exercise Physiologist;Other  Darel Hong, RN   Supervising physician immediately available to respond to emergencies [facility] immediately available ER MD   Physician(s) Dr. Corky Downs and Dr. Mable Paris   Medication changes reported     No   Fall or balance concerns reported    No   Warm-up and Cool-down Performed on first and last piece of equipment   Resistance Training Performed Yes   VAD Patient? No     Pain Assessment   Currently in Pain? No/denies         History  Smoking Status  . Former Smoker  . Quit date: 12/05/1986  Smokeless Tobacco  . Never Used    Goals Met:  Proper associated with RPD/PD & O2 Sat Independence with exercise equipment Using PLB without cueing & demonstrates good technique Exercise tolerated well Strength training completed today  Goals Unmet:  Not Applicable  Comments: Pt able to follow exercise prescription today without complaint.  Will continue to monitor for progression.    Dr. Emily Filbert is Medical Director for [place] and [facility].

## 2016-12-29 ENCOUNTER — Encounter: Payer: Self-pay | Admitting: Respiratory Therapy

## 2016-12-29 DIAGNOSIS — J449 Chronic obstructive pulmonary disease, unspecified: Secondary | ICD-10-CM | POA: Diagnosis not present

## 2016-12-29 NOTE — Progress Notes (Signed)
Daily Session Note  Patient Details  Name: [name] [name] MRN: [id number] Date of Birth: August 28, 1927 Referring Provider:    Encounter Date: 12/29/2016  Check In:     Session Check In - 12/29/16 1402      Check-In   Location [facility]   Staff Present Carson Myrtle, BS, RRT, Respiratory Therapist;Kelly Amedeo Plenty, BS, ACSM CEP, Exercise Physiologist;Amanda Oletta Darter, BA, ACSM CEP, Exercise Physiologist   Supervising physician immediately available to respond to emergencies [facility] immediately available ER MD   Physician(s) Corky Downs and Jimmye Norman   Medication changes reported     No   Fall or balance concerns reported    No   Warm-up and Cool-down Performed as group-led instruction   Resistance Training Performed Yes   VAD Patient? No     Pain Assessment   Currently in Pain? No/denies         History  Smoking Status  . Former Smoker  . Quit date: 12/05/1986  Smokeless Tobacco  . Never Used    Goals Met:  Proper associated with RPD/PD & O2 Sat Independence with exercise equipment Exercise tolerated well Strength training completed today  Goals Unmet:  Not Applicable  Comments: Pt able to follow exercise prescription today without complaint.  Will continue to monitor for progression.    Dr. Emily Filbert is Medical Director for [place] and [facility].

## 2016-12-29 NOTE — Progress Notes (Signed)
Pulmonary Individual Treatment Plan  Patient Details  Name: [name] [name] MRN: [id number] Date of Birth: 1926/11/12 Referring Provider:    Initial Encounter Date:    Pulmonary Rehab from 09/30/2016 in [facility] Cardiac and Pulmonary Rehab  Date  09/30/16      Visit Diagnosis: Chronic obstructive pulmonary disease, unspecified COPD type ([place])  Patient's Home Medications on Admission:  Current Outpatient Prescriptions:    albuterol (PROVENTIL HFA;VENTOLIN HFA) 108 (90 BASE) MCG/ACT inhaler, Inhale 2 puffs into the lungs every 6 (six) hours as needed for wheezing or shortness of breath., Disp: , Rfl:    Albuterol Sulfate (PROAIR HFA IN), Inhale into the lungs., Disp: , Rfl:    amLODipine (NORVASC) 2.5 MG tablet, Take 2.5 mg by mouth daily., Disp: , Rfl:    aspirin 325 MG EC tablet, Take 325 mg by mouth daily., Disp: , Rfl:    atorvastatin (LIPITOR) 10 MG tablet, Take 10 mg by mouth daily., Disp: , Rfl:    carvedilol (COREG) 25 MG tablet, Take 25 mg by mouth 2 (two) times daily with a meal., Disp: , Rfl:    cloNIDine (CATAPRES) 0.2 MG tablet, Take 0.2 mg by mouth 2 (two) times daily., Disp: , Rfl:    clopidogrel (PLAVIX) 75 MG tablet, Take 75 mg by mouth daily., Disp: , Rfl:    ezetimibe (ZETIA) 10 MG tablet, Take 10 mg by mouth daily., Disp: , Rfl:    furosemide (LASIX) 40 MG tablet, Take 20 mg by mouth. , Disp: , Rfl:    metoprolol succinate (TOPROL-XL) 100 MG 24 hr tablet, Take 100 mg by mouth daily. Take with or immediately following a meal., Disp: , Rfl:    montelukast (SINGULAIR) 10 MG tablet, Take 10 mg by mouth at bedtime., Disp: , Rfl:    olmesartan-hydrochlorothiazide (BENICAR HCT) 40-25 MG per tablet, Take 1 tablet by mouth daily., Disp: , Rfl:    omeprazole (PRILOSEC) 40 MG capsule, Take 40 mg by mouth daily., Disp: , Rfl:    quinapril (ACCUPRIL) 40 MG tablet, Take 40 mg by mouth daily., Disp: , Rfl:    sildenafil (VIAGRA) 100 MG tablet, Take 100 mg by mouth daily as  needed for erectile dysfunction., Disp: , Rfl:    Umeclidinium-Vilanterol 62.5-25 MCG/INH AEPB, Inhale into the lungs., Disp: , Rfl:    vardenafil (LEVITRA) 20 MG tablet, Take 20 mg by mouth daily as needed for erectile dysfunction., Disp: , Rfl:   Past Medical History: Past Medical History:  Diagnosis Date   Breast cancer ([place]) 1978   left   CAD (coronary artery disease)    DVT (deep venous thrombosis) (HCC)    Hemorrhoids    History of knee replacement, total    Hypertension    Ischemic cardiomyopathy    Peripheral arterial disease (HCC)    Seizure disorder ([place])     Tobacco Use: History  Smoking Status   Former Smoker   Quit date: 12/05/1986  Smokeless Tobacco   Never Used    Labs: Recent Review Flowsheet Data    There is no flowsheet data to display.       ADL UCSD:     Pulmonary Assessment Scores    Row Name 09/30/16 1459 11/19/16 1532 12/22/16 1227     ADL UCSD   ADL Phase Entry Mid Exit   SOB Score total 62 55 29   Rest 0 1 0   Walk '4 2 2   ' Stairs '4 4 4   ' Bath 2 3  1   Dress '3 3 1   ' Shop '3 3 2      ' Pulmonary Function Assessment:     Pulmonary Function Assessment - 09/30/16 1458      Pulmonary Function Tests   FVC% 91 %   FEV1% 96 %   FEV1/FVC Ratio 71   RV% 75 %   DLCO% 67 %     Breath   Bilateral Breath Sounds Decreased;Clear   Shortness of Breath Yes;Limiting activity      Exercise Target Goals:    Exercise Program Goal: Individual exercise prescription set with THRR, safety & activity barriers. Participant demonstrates ability to understand and report RPE using BORG scale, to self-measure pulse accurately, and to acknowledge the importance of the exercise prescription.  Exercise Prescription Goal: Starting with aerobic activity 30 plus minutes a day, 3 days per week for initial exercise prescription. Provide home exercise prescription and guidelines that participant acknowledges understanding prior to  discharge.  Activity Barriers & Risk Stratification:     Activity Barriers & Cardiac Risk Stratification - 09/30/16 1457      Activity Barriers & Cardiac Risk Stratification   Activity Barriers Arthritis;Joint Problems;Shortness of Breath;Deconditioning;Muscular Weakness;History of Falls      6 Minute Walk:     6 Minute Walk    Row Name 09/30/16 1356 11/21/16 1226 12/22/16 1251     6 Minute Walk   Phase  -- Mid Program Discharge   Distance 845 feet 965 feet 1125 feet   Distance % Change  -- 14.2 %  120 ft 33 %   Walk Time 6 minutes 6 minutes 6 minutes   # of Rest Breaks 0 0 0   MPH 1.6 1.83 2.13   METS 2.23 2.4 2.61   RPE '13 17 15   ' Perceived Dyspnea  '3 3 3   ' VO2 Peak 3.16 3.69 5.54   Symptoms No Yes (comment) No   Comments  -- SOB  --   Resting HR 60 bpm 82 bpm 77 bpm   Resting BP 114/50 124/64 142/78   Max Ex. HR 95 bpm 102 bpm 100 bpm   Max Ex. BP 152/56 132/70 164/74   2 Minute Post BP  -- 126/64  --     Interval HR   Baseline HR 60 82 77   1 Minute HR 94 95 86   2 Minute HR 93 95 78   3 Minute HR 95 94 100   4 Minute HR 95 95 97   5 Minute HR 95 95 97   6 Minute HR 95 102 96   2 Minute Post HR  -- 93  --   Interval Heart Rate? Yes Yes Yes     Interval Oxygen   Interval Oxygen? Yes Yes Yes   Baseline Oxygen Saturation % 97 % 99 % 98 %   Baseline Liters of Oxygen  -- 0 L  Room Air  --   1 Minute Oxygen Saturation % 95 % 94 % 96 %   1 Minute Liters of Oxygen  -- 0 L  --   2 Minute Oxygen Saturation % 96 % 94 % 94 %   2 Minute Liters of Oxygen  -- 0 L  --   3 Minute Oxygen Saturation % 96 % 94 % 96 %   3 Minute Liters of Oxygen  -- 0 L  --   4 Minute Oxygen Saturation % 96 % 94 % 97 %   4 Minute Liters  of Oxygen  -- 0 L  --   5 Minute Oxygen Saturation % 96 % 94 % 96 %   5 Minute Liters of Oxygen  -- 0 L  --   6 Minute Oxygen Saturation % 96 % 94 % 96 %   6 Minute Liters of Oxygen  -- 0 L  --   2 Minute Post Oxygen Saturation %  -- 96 %  --   2  Minute Post Liters of Oxygen  -- 0 L  --     Oxygen Initial Assessment:   Oxygen Re-Evaluation:   Oxygen Discharge (Final Oxygen Re-Evaluation):   Initial Exercise Prescription:     Initial Exercise Prescription - 09/30/16 1300      Date of Initial Exercise RX and Referring Provider   Date 09/30/16     Treadmill   MPH 1.4   Minutes 15   METs 2.07     NuStep   Level 2   Minutes 15   METs 2     T5 Nustep   Level 1   Minutes 15   METs 2     Biostep-RELP   Level 2   Minutes 15   METs 2     Prescription Details   Frequency (times per week) 3   Duration Progress to 45 minutes of aerobic exercise without signs/symptoms of physical distress     Intensity   THRR 40-80% of Max Heartrate 89-118   Ratings of Perceived Exertion 11-13   Perceived Dyspnea 0-4     Progression   Progression Continue to progress workloads to maintain intensity without signs/symptoms of physical distress.     Resistance Training   Training Prescription Yes   Weight 2   Reps 10-15      Perform Capillary Blood Glucose checks as needed.  Exercise Prescription Changes:     Exercise Prescription Changes    Row Name 10/06/16 1500 10/17/16 1300 10/22/16 1500 11/05/16 1500 11/19/16 1500     Response to Exercise   Blood Pressure (Admit) 110/64  -- 124/64 112/64 146/80   Blood Pressure (Exercise)  --  -- 118/66 126/70 134/82   Blood Pressure (Exit) 144/80  -- 112/60 126/70 112/60   Heart Rate (Admit) 68 bpm  -- 81 bpm 64 bpm 81 bpm   Heart Rate (Exercise) 92 bpm  -- 66 bpm 89 bpm 95 bpm   Heart Rate (Exit) 59 bpm  -- 62 bpm 63 bpm 65 bpm   Oxygen Saturation (Admit) 95 %  -- 99 % 97 % 97 %   Oxygen Saturation (Exercise) 98 %  -- 97 % 95 % 97 %   Oxygen Saturation (Exit) 99 %  -- 96 % 95 % 98 %   Rating of Perceived Exertion (Exercise) 14  -- '13 13 13   ' Perceived Dyspnea (Exercise) 3  -- '3 3 3   ' Symptoms none none none none none   Comments  -- Home Exercise Guidelines given 10/17/16  Home Exercise Guidelines given 10/17/16 Home Exercise Guidelines given 10/17/16  --   Duration Progress to 45 minutes of aerobic exercise without signs/symptoms of physical distress Progress to 45 minutes of aerobic exercise without signs/symptoms of physical distress Progress to 45 minutes of aerobic exercise without signs/symptoms of physical distress Progress to 45 minutes of aerobic exercise without signs/symptoms of physical distress Continue with 45 min of aerobic exercise without signs/symptoms of physical distress.   Intensity THRR unchanged THRR unchanged THRR unchanged THRR unchanged THRR unchanged  Progression   Progression Continue to progress workloads to maintain intensity without signs/symptoms of physical distress. Continue to progress workloads to maintain intensity without signs/symptoms of physical distress. Continue to progress workloads to maintain intensity without signs/symptoms of physical distress. Continue to progress workloads to maintain intensity without signs/symptoms of physical distress. Continue to progress workloads to maintain intensity without signs/symptoms of physical distress.   Average METs  --  -- 2.09 2.16 2.43     Resistance Training   Training Prescription Yes Yes Yes Yes Yes   Weight 2 2 Green Band 4 lbs 4 lbs   Reps 10-15 10-15 10-15 10-15 10-15     Interval Training   Interval Training  --  -- No No No     Treadmill   MPH 1.4 1.4 1.4 1.4 2.2   Grade  --  -- 0 0 0   Minutes 15  3/3/3 15  3/3/'3 9  3 ' min x3 15 15   METs  --  -- 2.07 2.07 2.68     NuStep   Level  --  -- '2 4 4   ' SPM  --  -- --  78 spm  -- 84   Minutes  --  -- '15 15 15   ' METs  --  -- 2.2 2.4 2.6     Biostep-RELP   Level '2 2 2 3 3   ' SPM  --  --  --  -- 55   Minutes '15 15 15 15 15   ' METs '2 2 2  ' 46 spm 2 2     Home Exercise Plan   Plans to continue exercise at  -- Home  walking and bands Home  walking and bands Home  walking and bands Home (comment)  walking and bands    Frequency  -- Add 1 additional day to program exercise sessions. Add 1 additional day to program exercise sessions. Add 1 additional day to program exercise sessions. Add 1 additional day to program exercise sessions.   Initial Home Exercises Provided  --  --  --  -- 10/17/16     Exercise Review   Progression --  first full day of exercise  -- Yes Yes  --   Row Name 12/02/16 1600 12/17/16 1400           Response to Exercise   Blood Pressure (Admit) 124/60 142/58      Blood Pressure (Exercise) 126/74 122/84      Blood Pressure (Exit) 124/64 142/60      Heart Rate (Admit) 67 bpm 74 bpm      Heart Rate (Exercise) 69 bpm 94 bpm      Heart Rate (Exit) 63 bpm 66 bpm      Oxygen Saturation (Admit) 96 % 96 %      Oxygen Saturation (Exercise) 94 % 94 %      Oxygen Saturation (Exit) 94 % 95 %      Rating of Perceived Exertion (Exercise) 14 14      Perceived Dyspnea (Exercise) 3 3      Symptoms none none      Duration Continue with 45 min of aerobic exercise without signs/symptoms of physical distress. Continue with 45 min of aerobic exercise without signs/symptoms of physical distress.      Intensity THRR unchanged THRR unchanged        Progression   Progression Continue to progress workloads to maintain intensity without signs/symptoms of physical distress. Continue to progress workloads to maintain intensity  without signs/symptoms of physical distress.      Average METs 2.59 2.51        Resistance Training   Training Prescription Yes Yes      Weight 4 lbs red band      Reps 10-15 10-15        Interval Training   Interval Training No No        Treadmill   MPH 1.4 1.6      Grade 0 0      Minutes 15 15      METs 2.07 2.23        NuStep   Level 4 4      SPM 93 79      Minutes 15 15      METs 3.7 2.3        Biostep-RELP   Level 12 12      SPM 34 26      Minutes 15 15      METs 2 3        Home Exercise Plan   Plans to continue exercise at Home (comment)  walking and  bands Home (comment)  walking and bands      Frequency Add 2 additional days to program exercise sessions. Add 2 additional days to program exercise sessions.      Initial Home Exercises Provided 10/17/16 10/17/16         Exercise Comments:     Exercise Comments    Row Name 10/06/16 1508 10/17/16 1336 10/22/16 1508 11/05/16 1531     Exercise Comments  First full day of exercise!  Patient was oriented to gym and equipment including functions, settings, policies, and procedures.  Patient's individual exercise prescription and treatment plan were reviewed.  All starting workloads were established based on the results of the 6 minute walk test done at initial orientation visit.  The plan for exercise progression was also introduced and progression will be customized based on patient's performance and goals. Reviewed home exercise with pt today.  Pt plans to walk and use bands at home for exercise.  Reviewed THR, pulse, RPE, sign and symptoms, and when to call 911 or MD.  Also discussed weather considerations and indoor options.  Pt voiced understanding. Linell is off to a good start with exercise.  She has already started to notice a difference.  We will continue to monitor her for progression. Marixa has been doing well in rehab.  She is already almost half way through the program.  Trudy is up to level 4 on the BioStep.  We will continue to track her progression.       Exercise Goals and Review:   Exercise Goals Re-Evaluation :     Exercise Goals Re-Evaluation    Row Name 11/19/16 1540 11/21/16 1229 12/02/16 1631 12/17/16 1449       Exercise Goal Re-Evaluation   Exercise Goals Review Increase Physical Activity;Increase Strenth and Stamina Increase Physical Activity Increase Physical Activity;Increase Strenth and Stamina Increase Physical Activity;Increase Strenth and Stamina    Comments Aldora is officially half way through the program!  We will be doing her mid program 6MWT on Friday.  She also  increased her treadmill again today to 2.2 mph!  We will continue to monitor her progression. Niharika improved her walk test by 120 ft!! Faithlyn has been doing well in rehab.  She has made it past the half way point!  She is now up to level 12 on the BioStep.  We will continue to monitor her progression. Leala is getting closer to graduation.  She was on visit 61 today!!  We will be doing her post 6MWT soon!  She is continued to make improvements as she is up to 1.6 mph on the treadmill.  We will continue to monitor her progression.    Expected Outcomes Short: Sol will improve on her 6MWT.  Long: Willine will continue to come to to class to work on strength and stamina.  -- Short:  Bergen will conintue to work on progress, may increase her treadmill some.  Long; Continue to work on IT sales professional. Short: Annagrace will complete her post 6MWT.  Long: Avabella will graduate and continue to exercise independently!       Discharge Exercise Prescription (Final Exercise Prescription Changes):     Exercise Prescription Changes - 12/17/16 1400      Response to Exercise   Blood Pressure (Admit) 142/58   Blood Pressure (Exercise) 122/84   Blood Pressure (Exit) 142/60   Heart Rate (Admit) 74 bpm   Heart Rate (Exercise) 94 bpm   Heart Rate (Exit) 66 bpm   Oxygen Saturation (Admit) 96 %   Oxygen Saturation (Exercise) 94 %   Oxygen Saturation (Exit) 95 %   Rating of Perceived Exertion (Exercise) 14   Perceived Dyspnea (Exercise) 3   Symptoms none   Duration Continue with 45 min of aerobic exercise without signs/symptoms of physical distress.   Intensity THRR unchanged     Progression   Progression Continue to progress workloads to maintain intensity without signs/symptoms of physical distress.   Average METs 2.51     Resistance Training   Training Prescription Yes   Weight red band   Reps 10-15     Interval Training   Interval Training No     Treadmill   MPH 1.6   Grade 0   Minutes 15   METs 2.23      NuStep   Level 4   SPM 79   Minutes 15   METs 2.3     Biostep-RELP   Level 12   SPM 26   Minutes 15   METs 3     Home Exercise Plan   Plans to continue exercise at Home (comment)  walking and bands   Frequency Add 2 additional days to program exercise sessions.   Initial Home Exercises Provided 10/17/16      Nutrition:  Target Goals: Understanding of nutrition guidelines, daily intake of sodium <1567m, cholesterol <2031m calories 30% from fat and 7% or less from saturated fats, daily to have 5 or more servings of fruits and vegetables.  Biometrics:     Pre Biometrics - 09/30/16 1356      Pre Biometrics   Height 5' 2.5" (1.588 m)   Weight 184 lb 6.4 oz (83.6 kg)   Waist Circumference 38.5 inches   Hip Circumference 48.25 inches   Waist to Hip Ratio 0.8 %   BMI (Calculated) 33.3         Post Biometrics - 12/22/16 1250       Post  Biometrics   Height 5' 2.5" (1.588 m)   Weight 179 lb (81.2 kg)   Waist Circumference 36 inches   Hip Circumference 48 inches   Waist to Hip Ratio 0.75 %   BMI (Calculated) 32.3      Nutrition Therapy Plan and Nutrition Goals:   Nutrition Discharge: Rate Your Plate Scores:   Nutrition Goals Re-Evaluation:   Nutrition  Goals Discharge (Final Nutrition Goals Re-Evaluation):   Psychosocial: Target Goals: Acknowledge presence or absence of significant depression and/or stress, maximize coping skills, provide positive support system. Participant is able to verbalize types and ability to use techniques and skills needed for reducing stress and depression.   Initial Review & Psychosocial Screening:     Initial Psych Review & Screening - 09/30/16 [place]? Yes   Comments Ms Lazenby has good support from her children; 2 children live close and can help if Ms Hallett needs assistence. She states she is very independent and likes taking care of things herself. She is looking forward to  [employer] and increasing her walking ability. Since she has a copay, she may switch to [employer] and use her [employer].     Barriers   Psychosocial barriers to participate in program The patient should benefit from training in stress management and relaxation.     Screening Interventions   Interventions Encouraged to exercise;Program counselor consult      Quality of Life Scores:     Quality of Life - 12/22/16 1227      Quality of Life Scores   Health/Function Pre 20.87 %   Health/Function Post 18.67 %   Health/Function % Change -10.54 %   Socioeconomic Pre 20.33 %   Socioeconomic Post 22.25 %   Socioeconomic % Change  9.44 %   Psych/Spiritual Pre 20.86 %   Psych/Spiritual Post 26.5 %   Psych/Spiritual % Change 27.04 %   Family Pre 21 %   Family Post 30 %   Family % Change 42.86 %   GLOBAL Pre 20.78 %   GLOBAL Post 22.47 %   GLOBAL % Change 8.13 %      PHQ-9: Recent Review Flowsheet Data    Depression screen [facility] 2/9 12/22/2016 09/30/2016   Decreased Interest 0 1   Down, Depressed, Hopeless 0 0   PHQ - 2 Score 0 1   Altered sleeping 2 3   Tired, decreased energy 2 3   Change in appetite 0 1   Feeling bad or failure about yourself  0 0   Trouble concentrating 0 1   Moving slowly or fidgety/restless 0 0   Suicidal thoughts 0 0   PHQ-9 Score 4 9   Difficult doing work/chores Not difficult at all Very difficult     Interpretation of Total Score  Total Score Depression Severity:  1-4 = Minimal depression, 5-9 = Mild depression, 10-14 = Moderate depression, 15-19 = Moderately severe depression, 20-27 = Severe depression   Psychosocial Evaluation and Intervention:     Psychosocial Evaluation - 10/06/16 1235      Psychosocial Evaluation & Interventions   Interventions Encouraged to exercise with the program and follow exercise prescription   Comments Counselor met with Aneisha today for initial psychosocial evaluation.  She is an 83 year old who has struggled with  COPD for awhile now.  She lives alone but has (3) adult daughters who call and check on her everyday and Mylinh is also actively involved in her local church.  Shaily has had multiple surgeries for shoulder, knee and hip replacements and has HBP as well.  She reports not sleeping well at all for a long time - and had a sleep study years ago for this.  Counselor assessed caffeine intake with Dorlene reporting drinks soft drinks up until 6PM at night.  Counselor encouraged decreasing the amount  of soft drink intake and stop all caffeine priior to 3PM to help with getting to sleep better.  Ariam has a good appetite and denies a history or current symptoms of depression or anxiety.  She states she is typically in a positive mood and other than her health and her finances, she has minimal stress in her life.  She has goals for losing a little weight and being able to do some of her normal household activities without shortness of breath.  She would also like to "get off some of this expensive medication."  Counselor encouarged Ajwa to exercise consistently and see if it helps sleep in addition to the decreased caffeine intake.  Staff will be following with Rod Holler throughout the course of this program.        Psychosocial Re-Evaluation:     Psychosocial Re-Evaluation    [place] Name 11/26/16 1303 12/10/16 1210           Psychosocial Re-Evaluation   Current issues with None Identified  --      Comments Nashia loves to talk about her family, especially her grandchildren, young grandchildren and adult grandchildren.  She finds joy in helping them and likes to stay involved in their lives.  She states that she also loves to relax by going and spending time with an elderly neighbor.  Her neighbors are good about "keeping an eye out" for her and her family checks on her multiple times a day.  She seems to be in good spirits with little stress concerns.   Counselor follow up with Vasilisa reporting seeing some improvement since  beginning this class.  She is walking better with less shortness of breath.  She also is able to do more household activities without having to rest so much in between.  Haly also reports some weight loss since she began this class.  Geena has seen progress in fewer aches and pains as well.  She continues to struggle with sleep problems but that has been a chronic problem for quite some time.  She has reduced her intake of soft drinks to make sure the caffeine was not part of the problem.  But sleep still eludes her most nights only getting 2-4 hours.  Counselor commended Laisa on all the progress made and her plan to continue a follow up program upon completion of this program.        Expected Outcomes  -- Reginna will continue to exercise consistently and experience the multiple benefits reported.  She will continue to pace herself better with her household tasks and responsibilities.  Layza will continue to work on her weight loss goals.      Interventions Encouraged to attend Pulmonary Rehabilitation for the exercise  --      Continue Psychosocial Services  Follow up required by staff Follow up required by staff         Psychosocial Discharge (Final Psychosocial Re-Evaluation):     Psychosocial Re-Evaluation - 12/10/16 1210      Psychosocial Re-Evaluation   Comments Counselor follow up with Rod Holler reporting seeing some improvement since beginning this class.  She is walking better with less shortness of breath.  She also is able to do more household activities without having to rest so much in between.  Kriston also reports some weight loss since she began this class.  Cambrea has seen progress in fewer aches and pains as well.  She continues to struggle with sleep problems but that has been a chronic problem  for quite some time.  She has reduced her intake of soft drinks to make sure the caffeine was not part of the problem.  But sleep still eludes her most nights only getting 2-4 hours.  Counselor commended  Ahnna on all the progress made and her plan to continue a follow up program upon completion of this program.     Expected Outcomes Winry will continue to exercise consistently and experience the multiple benefits reported.  She will continue to pace herself better with her household tasks and responsibilities.  Amyia will continue to work on her weight loss goals.   Continue Psychosocial Services  Follow up required by staff      Education: Education Goals: Education classes will be provided on a weekly basis, covering required topics. Participant will state understanding/return demonstration of topics presented.  Learning Barriers/Preferences:     Learning Barriers/Preferences - 09/30/16 1458      Learning Barriers/Preferences   Learning Barriers None   Learning Preferences None      Education Topics: Initial Evaluation Education: - Verbal, written and demonstration of respiratory meds, RPE/PD scales, oximetry and breathing techniques. Instruction on use of nebulizers and MDIs: cleaning and proper use, rinsing mouth with steroid doses and importance of monitoring MDI activations.   Pulmonary Rehab from 12/26/2016 in [facility] Cardiac and Pulmonary Rehab  Date  09/30/16  Educator  LB  Instruction Review Code  2- meets goals/outcomes      General Nutrition Guidelines/Fats and Fiber: -Group instruction provided by verbal, written material, models and posters to present the general guidelines for heart healthy nutrition. Gives an explanation and review of dietary fats and fiber.   Pulmonary Rehab from 12/26/2016 in [facility] Cardiac and Pulmonary Rehab  Date  11/24/16  Educator  CR  Instruction Review Code  2- meets goals/outcomes      Controlling Sodium/Reading Food Labels: -Group verbal and written material supporting the discussion of sodium use in heart healthy nutrition. Review and explanation with models, verbal and written materials for utilization of the food label.   Pulmonary Rehab  from 12/26/2016 in [facility] Cardiac and Pulmonary Rehab  Date  12/08/16  Educator  CR  Instruction Review Code  2- meets goals/outcomes      Exercise Physiology & Risk Factors: - Group verbal and written instruction with models to review the exercise physiology of the cardiovascular system and associated critical values. Details cardiovascular disease risk factors and the goals associated with each risk factor.   Pulmonary Rehab from 12/26/2016 in [facility] Cardiac and Pulmonary Rehab  Date  12/26/16  Educator  [place]  Instruction Review Code  2- meets goals/outcomes      Aerobic Exercise & Resistance Training: - Gives group verbal and written discussion on the health impact of inactivity. On the components of aerobic and resistive training programs and the benefits of this training and how to safely progress through these programs.   Pulmonary Rehab from 12/26/2016 in [facility] Cardiac and Pulmonary Rehab  Date  10/22/16  Educator  [facility]  Instruction Review Code  2- meets goals/outcomes      Flexibility, Balance, General Exercise Guidelines: - Provides group verbal and written instruction on the benefits of flexibility and balance training programs. Provides general exercise guidelines with specific guidelines to those with heart or lung disease. Demonstration and skill practice provided.   Pulmonary Rehab from 12/26/2016 in [facility] Cardiac and Pulmonary Rehab  Date  11/14/16  Educator  AS  Instruction Review Code  2- meets goals/outcomes  Stress Management: - Provides group verbal and written instruction about the health risks of elevated stress, cause of high stress, and healthy ways to reduce stress.   Pulmonary Rehab from 12/26/2016 in [facility] Cardiac and Pulmonary Rehab  Date  12/24/16  Educator  [facility]  Instruction Review Code  2- meets goals/outcomes      Depression: - Provides group verbal and written instruction on the correlation between heart/lung disease and depressed mood, treatment options,  and the stigmas associated with seeking treatment.   Pulmonary Rehab from 12/26/2016 in [facility] Cardiac and Pulmonary Rehab  Date  11/26/16  Educator  [facility]  Instruction Review Code  2- meets goals/outcomes      Exercise & Equipment Safety: - Individual verbal instruction and demonstration of equipment use and safety with use of the equipment.   Pulmonary Rehab from 12/26/2016 in [facility] Cardiac and Pulmonary Rehab  Date  10/06/16  Educator  KH/AS  Instruction Review Code  2- meets goals/outcomes      Infection Prevention: - Provides verbal and written material to individual with discussion of infection control including proper hand washing and proper equipment cleaning during exercise session.   Pulmonary Rehab from 12/26/2016 in [facility] Cardiac and Pulmonary Rehab  Date  10/06/16  Educator  KH/AS  Instruction Review Code  2- meets goals/outcomes      Falls Prevention: - Provides verbal and written material to individual with discussion of falls prevention and safety.   Pulmonary Rehab from 12/26/2016 in [facility] Cardiac and Pulmonary Rehab  Date  09/30/16  Educator  LB  Instruction Review Code  2- meets goals/outcomes      Diabetes: - Individual verbal and written instruction to review signs/symptoms of diabetes, desired ranges of glucose level fasting, after meals and with exercise. Advice that pre and post exercise glucose checks will be done for 3 sessions at entry of program.   Chronic Lung Diseases: - Group verbal and written instruction to review new updates, new respiratory medications, new advancements in procedures and treatments. Provide informative websites and "800" numbers of self-education.   Pulmonary Rehab from 12/26/2016 in [facility] Cardiac and Pulmonary Rehab  Date  10/15/16  Educator  LB  Instruction Review Code  2- meets goals/outcomes      Lung Procedures: - Group verbal and written instruction to describe testing methods done to diagnose lung disease. Review the outcome of  test results. Describe the treatment choices: Pulmonary Function Tests, ABGs and oximetry.   Energy Conservation: - Provide group verbal and written instruction for methods to conserve energy, plan and organize activities. Instruct on pacing techniques, use of adaptive equipment and posture/positioning to relieve shortness of breath.   Pulmonary Rehab from 12/26/2016 in [facility] Cardiac and Pulmonary Rehab  Date  11/19/16  Educator  [facility]  Instruction Review Code  2- meets goals/outcomes      Triggers: - Group verbal and written instruction to review types of environmental controls: home humidity, furnaces, filters, dust mite/pet prevention, HEPA vacuums. To discuss weather changes, air quality and the benefits of nasal washing.   Pulmonary Rehab from 12/26/2016 in [facility] Cardiac and Pulmonary Rehab  Date  11/12/16  Educator  LB  Instruction Review Code  2- meets goals/outcomes      Exacerbations: - Group verbal and written instruction to provide: warning signs, infection symptoms, calling MD promptly, preventive modes, and value of vaccinations. Review: effective airway clearance, coughing and/or vibration techniques. Create an Sports administrator.   Pulmonary Rehab from 12/26/2016 in [facility] Cardiac and  Pulmonary Rehab  Date  11/05/16  Educator  LB  Instruction Review Code  2- meets goals/outcomes      Oxygen: - Individual and group verbal and written instruction on oxygen therapy. Includes supplement oxygen, available portable oxygen systems, continuous and intermittent flow rates, oxygen safety, concentrators, and Medicare reimbursement for oxygen.   Respiratory Medications: - Group verbal and written instruction to review medications for lung disease. Drug class, frequency, complications, importance of spacers, rinsing mouth after steroid MDI's, and proper cleaning methods for nebulizers.   Pulmonary Rehab from 12/26/2016 in [facility] Cardiac and Pulmonary Rehab  Date  09/30/16  Educator  LB  Instruction  Review Code  2- meets goals/outcomes      AED/CPR: - Group verbal and written instruction with the use of models to demonstrate the basic use of the AED with the basic ABC's of resuscitation.   Breathing Retraining: - Provides individuals verbal and written instruction on purpose, frequency, and proper technique of diaphragmatic breathing and pursed-lipped breathing. Applies individual practice skills.   Pulmonary Rehab from 12/26/2016 in [facility] Cardiac and Pulmonary Rehab  Date  09/30/16  Educator  LB  Instruction Review Code  2- meets goals/outcomes      Anatomy and Physiology of the Lungs: - Group verbal and written instruction with the use of models to provide basic lung anatomy and physiology related to function, structure and complications of lung disease.   Pulmonary Rehab from 12/26/2016 in [facility] Cardiac and Pulmonary Rehab  Date  12/12/16  Educator  LB  Instruction Review Code  2- meets goals/outcomes      Heart Failure: - Group verbal and written instruction on the basics of heart failure: signs/symptoms, treatments, explanation of ejection fraction, enlarged heart and cardiomyopathy.   Sleep Apnea: - Individual verbal and written instruction to review Obstructive Sleep Apnea. Review of risk factors, methods for diagnosing and types of masks and machines for OSA.   Anxiety: - Provides group, verbal and written instruction on the correlation between heart/lung disease and anxiety, treatment options, and management of anxiety.   Pulmonary Rehab from 12/26/2016 in [facility] Cardiac and Pulmonary Rehab  Date  12/24/16  Educator  [facility]  Instruction Review Code  2- Meets goals/outcomes      Relaxation: - Provides group, verbal and written instruction about the benefits of relaxation for patients with heart/lung disease. Also provides patients with examples of relaxation techniques.   Pulmonary Rehab from 12/26/2016 in [facility] Cardiac and Pulmonary Rehab  Date  10/29/16  Educator  [facility]   Instruction Review Code  2- Meets goals/outcomes      Knowledge Questionnaire Score:     Knowledge Questionnaire Score - 12/22/16 1228      Knowledge Questionnaire Score   Pre Score 5/10   Post Score 10/10       Core Components/Risk Factors/Patient Goals at Admission:     Personal Goals and Risk Factors at Admission - 09/30/16 1502      Core Components/Risk Factors/Patient Goals on Admission   Sedentary Yes  Goal: increase walking; walks her dog in the back yard around the fence line.   Intervention Provide advice, education, support and counseling about physical activity/exercise needs.;Develop an individualized exercise prescription for aerobic and resistive training based on initial evaluation findings, risk stratification, comorbidities and participant's personal goals.   Expected Outcomes Achievement of increased cardiorespiratory fitness and enhanced flexibility, muscular endurance and strength shown through measurements of functional capacity and personal statement of participant.   Increase Strength and Stamina  Yes   Intervention Provide advice, education, support and counseling about physical activity/exercise needs.;Develop an individualized exercise prescription for aerobic and resistive training based on initial evaluation findings, risk stratification, comorbidities and participant's personal goals.   Expected Outcomes Achievement of increased cardiorespiratory fitness and enhanced flexibility, muscular endurance and strength shown through measurements of functional capacity and personal statement of participant.   Improve shortness of breath with ADL's Yes   Intervention Provide education, individualized exercise plan and daily activity instruction to help decrease symptoms of SOB with activities of daily living.   Expected Outcomes Short Term: Achieves a reduction of symptoms when performing activities of daily living.   Develop more efficient breathing techniques such  as purse lipped breathing and diaphragmatic breathing; and practicing self-pacing with activity Yes   Intervention Provide education, demonstration and support about specific breathing techniuqes utilized for more efficient breathing. Include techniques such as pursed lipped breathing, diaphragmatic breathing and self-pacing activity.   Expected Outcomes Short Term: Participant will be able to demonstrate and use breathing techniques as needed throughout daily activities.   Increase knowledge of respiratory medications and ability to use respiratory devices properly  Yes  Symbicort, Spiriva, Proventil, Spacer given; Duoneb   Intervention Provide education and demonstration as needed of appropriate use of medications, inhalers, and oxygen therapy.   Expected Outcomes Short Term: Achieves understanding of medications use. Understands that oxygen is a medication prescribed by physician. Demonstrates appropriate use of inhaler and oxygen therapy.   Hypertension Yes   Intervention Provide education on lifestyle modifcations including regular physical activity/exercise, weight management, moderate sodium restriction and increased consumption of fresh fruit, vegetables, and low fat dairy, alcohol moderation, and smoking cessation.;Monitor prescription use compliance.   Expected Outcomes Short Term: Continued assessment and intervention until BP is < 140/54m HG in hypertensive participants. < 130/879mHG in hypertensive participants with diabetes, heart failure or chronic kidney disease.;Long Term: Maintenance of blood pressure at goal levels.   Lipids Yes   Intervention Provide education and support for participant on nutrition & aerobic/resistive exercise along with prescribed medications to achieve LDL <7015mHDL >70m15m Expected Outcomes Short Term: Participant states understanding of desired cholesterol values and is compliant with medications prescribed. Participant is following exercise prescription and  nutrition guidelines.;Long Term: Cholesterol controlled with medications as prescribed, with individualized exercise RX and with personalized nutrition plan. Value goals: LDL < 70mg51mL > 40 mg.      Core Components/Risk Factors/Patient Goals Review:      Goals and Risk Factor Review    Row Name 10/21/16 0616 11/10/16 1507 12/10/16 1439 12/23/16 0651       Core Components/Risk Factors/Patient Goals Review   Personal Goals Review Sedentary;Increase Strength and Stamina;Improve shortness of breath with ADL's;Develop more efficient breathing techniques such as purse lipped breathing and diaphragmatic breathing and practicing self-pacing with activity.;Increase knowledge of respiratory medications and ability to use respiratory devices properly.;Hypertension;Lipids Weight Management/Obesity;Sedentary;Increase Strength and Stamina;Improve shortness of breath with ADL's;Develop more efficient breathing techniques such as purse lipped breathing and diaphragmatic breathing and practicing self-pacing with activity.;Increase knowledge of respiratory medications and ability to use respiratory devices properly.;Hypertension;Lipids Weight Management/Obesity;Improve shortness of breath with ADL's;Increase knowledge of respiratory medications and ability to use respiratory devices properly.;Develop more efficient breathing techniques such as purse lipped breathing and diaphragmatic breathing and practicing self-pacing with activity.;Hypertension;Lipids Weight Management/Obesity;Improve shortness of breath with ADL's;Increase knowledge of respiratory medications and ability to use respiratory devices properly.;Develop more efficient breathing techniques such as purse lipped breathing  and diaphragmatic breathing and practicing self-pacing with activity.;Lipids;Hypertension    Review Ms Rozzell is progressing with her exercise goals. She is now exercising 15 minutes on the BioS and NS. The treadmill is difficult for  her, so she in doing set work and plans to increase her set times to 4 mins her next session. She uses PLB without cueing and manages her activities at home with pacing. A big concern for her is the cost of her inhalers. I have talked with her about switching to her nebulizer which Medicare Part D helps pay for these drugs. Ms Kamiya has been using the spacer I gave her for her Proventil. She is now considering meeting with the dietitian. Her blood pressures are acceptable and she is compiant with her BP and Lipid drugs. Ms Leggitt has increased her exercise goals and is now performing 15 minutes on the treadmill. Her physician appointment recently went well with a good report on her lipids and her blood pressure. 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 Ms Gambrill is enjoying exercise and is planning to exercise at her son's apartment gym. on the days off from [place]. She still has increased shortness of breath, but she has noticed an improvement in house chores, such as vacuming. She has good technique with her PLB. Ms Duffner attends regularly and enjoys the education. Ms Medinger has progressed with her exercise goals and has more energy at home for her housework. She is planning to continue exercise in [employer] and does have a school close to her house that has a nice walking track. Ms Haber has not lost weight, but I encouraged her to perform the extra walking at home to help with weight loseShe has a good understanding of her MDI's and other medication . Her blood pressure has been acceptable in [facility]. PLB and pacing are routine for managing her shortness of breath.  Ms Oehlert is graduating soon. She has progressed with her exercise goals and improved her post 40md by 2853f- Minimal Importance Difference for COPD is 98.42f45fShe also improved her shortness of breath on the UCSD questionnaire by 33points - Minimal  Importance Difference is 5 points. There is a new school by her house that has a mile walking track around the building . Ms BraCrass looking forward to walking there twice a week and may join [employer]/week. She has a good understaning of her MDI's and uses her spacer. Her blood pressures have been acceptable in [facility], and she is compliant with her medications.Ms BraYeomans attended regularly and has enjoyed the education. She should be commended for her dedicated participation in [facility]   Expected Outcomes Continue progressing with her exercise and continue working with her on her respiratory medications. Continue progressing with her exercise goals and add 1 to 2 extra days of exercise. Continue attending [facility] regularly and start walking at the local school's track. Continue exercising and self management of her COPD through the knowledge she has gain in [facility]     Core Components/Risk Factors/Patient Goals at Discharge (Final Review):      Goals and Risk Factor Review - 12/23/16 0651      Core Components/Risk Factors/Patient Goals Review   Personal Goals Review Weight Management/Obesity;Improve shortness of breath with ADL's;Increase knowledge of respiratory medications and ability to use respiratory devices properly.;Develop more efficient breathing techniques such as purse lipped breathing  and diaphragmatic breathing and practicing self-pacing with activity.;Lipids;Hypertension   Review Ms Jasek is graduating soon. She has progressed with her exercise goals and improved her post 52md by 2855f- Minimal Importance Difference for COPD is 98.4f72fShe also improved her shortness of breath on the UCSD questionnaire by 33points - Minimal Importance Difference is 5 points. There is a new school by her house that has a mile walking track around the building . Ms BraSonntag looking forward to walking there twice a week and may join [employer]/week. She has a good  understaning of her MDI's and uses her spacer. Her blood pressures have been acceptable in [facility], and she is compliant with her medications.Ms BraCheeses attended regularly and has enjoyed the education. She should be commended for her dedicated participation in [facility]  Expected Outcomes Continue exercising and self management of her COPD through the knowledge she has gain in [facility]    ITP Comments:     ITP Comments    Row Name 09/30/16 1603 10/24/16 1131         ITP Comments  Ms BraLindhans to start [facility] on 10/06/16 and attend 2-3 days/week. She does have a co-pay and has [employer] Ms BraNiesey switch to the [employer] several weeks. Attended [employer] Education Class         Comments: 30 Day Note Review

## 2016-12-30 DIAGNOSIS — I482 Chronic atrial fibrillation: Secondary | ICD-10-CM | POA: Diagnosis not present

## 2016-12-31 ENCOUNTER — Encounter: Payer: PPO | Admitting: *Deleted

## 2016-12-31 DIAGNOSIS — J449 Chronic obstructive pulmonary disease, unspecified: Secondary | ICD-10-CM

## 2016-12-31 NOTE — Progress Notes (Signed)
Daily Session Note  Patient Details  Name: [name] [name] MRN: [id number] Date of Birth: 1926-09-27 Referring Provider:    Encounter Date: 12/31/2016  Check In:     Session Check In - 12/31/16 1309      Check-In   Location --   Staff Present --   Supervising physician immediately available to respond to emergencies --   Physician(s) --   Medication changes reported     --   Fall or balance concerns reported    --   Warm-up and Cool-down --   Resistance Training Performed --   VAD Patient? --     Pain Assessment   Currently in Pain? --         History  Smoking Status  . Former Smoker  . Quit date: 12/05/1986  Smokeless Tobacco  . Never Used    Goals Met:  Proper associated with RPD/PD & O2 Sat Independence with exercise equipment Using PLB without cueing & demonstrates good technique Exercise tolerated well Strength training completed today  Goals Unmet:  Not Applicable  Comments: Pt able to follow exercise prescription today without complaint.  Will continue to monitor for progression.    Dr. Emily Filbert is Medical Director for [place] and [facility].

## 2016-12-31 NOTE — Progress Notes (Signed)
Daily Session Note  Patient Details  Name: SHADAI MCCLANE MRN: [id number] Date of Birth: August 06, 1927 Referring Provider:    Encounter Date: 12/31/2016  Check In:     Session Check In - 12/31/16 1309      Check-In   Location [facility]-Cardiac & Pulmonary Rehab   Staff Present Gerlene Burdock, RN, BSN;Laureen Owens Shark, BS, RRT, Respiratory Therapist   Supervising physician immediately available to respond to emergencies [facility] immediately available ER MD   Physician(s) Dr. Jimmye Norman and Dr. Darl Householder   Medication changes reported     No   Fall or balance concerns reported    No   Warm-up and Cool-down Performed on first and last piece of equipment   Resistance Training Performed Yes   VAD Patient? No     Pain Assessment   Currently in Pain? No/denies         History  Smoking Status  . Former Smoker  . Quit date: 12/05/1986  Smokeless Tobacco  . Never Used    Goals Met:  Proper associated with RPD/PD & O2 Sat Exercise tolerated well  Goals Unmet:  Not Applicable  Comments:     Dr. Emily Filbert is Medical Director for [place] and [facility].

## 2016-12-31 NOTE — Patient Instructions (Signed)
Discharge Progress Report   Patient Details  Name: [name] [name] MRN: [id number] Date of Birth: [birth year] Referring Provider:  Tracie Harrier, MD   Number of Visits: 36/36  Reason for Discharge:  Patient reached a stable level of exercise. Patient independent in their exercise.  Smoking History:  History  Smoking Status  . Former Smoker  . Quit date: 12/05/1986  Smokeless Tobacco  . Never Used    Diagnosis:  No diagnosis found.  Initial Exercise Prescription:     Initial Exercise Prescription - 09/30/16 1300      Date of Initial Exercise RX and Referring Provider   Date 09/30/16     Treadmill   MPH 1.4   Minutes 15   METs 2.07     NuStep   Level 2   Minutes 15   METs 2     T5 Nustep   Level 1   Minutes 15   METs 2     Biostep-RELP   Level 2   Minutes 15   METs 2     Prescription Details   Frequency (times per week) 3   Duration Progress to 45 minutes of aerobic exercise without signs/symptoms of physical distress     Intensity   THRR 40-80% of Max Heartrate 89-118   Ratings of Perceived Exertion 11-13   Perceived Dyspnea 0-4     Progression   Progression Continue to progress workloads to maintain intensity without signs/symptoms of physical distress.     Resistance Training   Training Prescription Yes   Weight 2   Reps 10-15      Discharge Exercise Prescription (Final Exercise Prescription Changes):     Exercise Prescription Changes - 12/31/16 1400      Response to Exercise   Blood Pressure (Admit) 120/76   Blood Pressure (Exercise) 138/70   Blood Pressure (Exit) 124/70   Heart Rate (Admit) 77 bpm   Heart Rate (Exercise) 95 bpm   Heart Rate (Exit) 66 bpm   Oxygen Saturation (Admit) 97 %   Oxygen Saturation (Exercise) 97 %   Oxygen Saturation (Exit) 97 %   Rating of Perceived Exertion (Exercise) 13   Perceived Dyspnea (Exercise) 3   Symptoms none   Duration Continue with 45 min of aerobic exercise without signs/symptoms of  physical distress.   Intensity THRR unchanged     Progression   Progression Continue to progress workloads to maintain intensity without signs/symptoms of physical distress.   Average METs 2.21     Resistance Training   Training Prescription Yes   Weight 3 lbs   Reps 10-15     Interval Training   Interval Training No     Treadmill   MPH 1.6   Grade 0   Minutes 15   METs 2.23     NuStep   Level 4   SPM 71   Minutes 15   METs 2.3     Biostep-RELP   Level 12   SPM 43   Minutes 15   METs 2     Home Exercise Plan   Plans to continue exercise at Home (comment)  walking and bands   Frequency Add 2 additional days to program exercise sessions.   Initial Home Exercises Provided 10/17/16      Functional Capacity:     6 Minute Walk    Row Name 09/30/16 1356 11/21/16 1226 12/22/16 1251     6 Minute Walk   Phase  - Mid Program Discharge   Distance  845 feet 965 feet 1125 feet   Distance % Change  - 14.2 %  120 ft 33 %   Walk Time 6 minutes 6 minutes 6 minutes   # of Rest Breaks 0 0 0   MPH 1.6 1.83 2.13   METS 2.23 2.4 2.61   RPE 13 17 15    Perceived Dyspnea  3 3 3    VO2 Peak 3.16 3.69 5.54   Symptoms No Yes (comment) No   Comments  - SOB  -   Resting HR 60 bpm 82 bpm 77 bpm   Resting BP 114/50 124/64 142/78   Max Ex. HR 95 bpm 102 bpm 100 bpm   Max Ex. BP 152/56 132/70 164/74   2 Minute Post BP  - 126/64  -     Interval HR   Baseline HR 60 82 77   1 Minute HR 94 95 86   2 Minute HR 93 95 78   3 Minute HR 95 94 100   4 Minute HR 95 95 97   5 Minute HR 95 95 97   6 Minute HR 95 102 96   2 Minute Post HR  - 93  -   Interval Heart Rate? Yes Yes Yes     Interval Oxygen   Interval Oxygen? Yes Yes Yes   Baseline Oxygen Saturation % 97 % 99 % 98 %   Baseline Liters of Oxygen  - 0 L  Room Air  -   1 Minute Oxygen Saturation % 95 % 94 % 96 %   1 Minute Liters of Oxygen  - 0 L  -   2 Minute Oxygen Saturation % 96 % 94 % 94 %   2 Minute Liters of Oxygen  -  0 L  -   3 Minute Oxygen Saturation % 96 % 94 % 96 %   3 Minute Liters of Oxygen  - 0 L  -   4 Minute Oxygen Saturation % 96 % 94 % 97 %   4 Minute Liters of Oxygen  - 0 L  -   5 Minute Oxygen Saturation % 96 % 94 % 96 %   5 Minute Liters of Oxygen  - 0 L  -   6 Minute Oxygen Saturation % 96 % 94 % 96 %   6 Minute Liters of Oxygen  - 0 L  -   2 Minute Post Oxygen Saturation %  - 96 %  -   2 Minute Post Liters of Oxygen  - 0 L  -      Quality of Life:     Quality of Life - 12/22/16 1227      Quality of Life Scores   Health/Function Pre 20.87 %   Health/Function Post 18.67 %   Health/Function % Change -10.54 %   Socioeconomic Pre 20.33 %   Socioeconomic Post 22.25 %   Socioeconomic % Change  9.44 %   Psych/Spiritual Pre 20.86 %   Psych/Spiritual Post 26.5 %   Psych/Spiritual % Change 27.04 %   Family Pre 21 %   Family Post 30 %   Family % Change 42.86 %   GLOBAL Pre 20.78 %   GLOBAL Post 22.47 %   GLOBAL % Change 8.13 %      Personal Goals: Goals established at orientation with interventions provided to work toward goal.     Personal Goals and Risk Factors at Admission - 09/30/16 1502      Core  Components/Risk Factors/Patient Goals on Admission   Sedentary Yes  Goal: increase walking; walks her dog in the back yard around the fence line.   Intervention Provide advice, education, support and counseling about physical activity/exercise needs.;Develop an individualized exercise prescription for aerobic and resistive training based on initial evaluation findings, risk stratification, comorbidities and participant's personal goals.   Expected Outcomes Achievement of increased cardiorespiratory fitness and enhanced flexibility, muscular endurance and strength shown through measurements of functional capacity and personal statement of participant.   Increase Strength and Stamina Yes   Intervention Provide advice, education, support and counseling about physical  activity/exercise needs.;Develop an individualized exercise prescription for aerobic and resistive training based on initial evaluation findings, risk stratification, comorbidities and participant's personal goals.   Expected Outcomes Achievement of increased cardiorespiratory fitness and enhanced flexibility, muscular endurance and strength shown through measurements of functional capacity and personal statement of participant.   Improve shortness of breath with ADL's Yes   Intervention Provide education, individualized exercise plan and daily activity instruction to help decrease symptoms of SOB with activities of daily living.   Expected Outcomes Short Term: Achieves a reduction of symptoms when performing activities of daily living.   Develop more efficient breathing techniques such as purse lipped breathing and diaphragmatic breathing; and practicing self-pacing with activity Yes   Intervention Provide education, demonstration and support about specific breathing techniuqes utilized for more efficient breathing. Include techniques such as pursed lipped breathing, diaphragmatic breathing and self-pacing activity.   Expected Outcomes Short Term: Participant will be able to demonstrate and use breathing techniques as needed throughout daily activities.   Increase knowledge of respiratory medications and ability to use respiratory devices properly  Yes  Symbicort, Spiriva, Proventil, Spacer given; Duoneb   Intervention Provide education and demonstration as needed of appropriate use of medications, inhalers, and oxygen therapy.   Expected Outcomes Short Term: Achieves understanding of medications use. Understands that oxygen is a medication prescribed by physician. Demonstrates appropriate use of inhaler and oxygen therapy.   Hypertension Yes   Intervention Provide education on lifestyle modifcations including regular physical activity/exercise, weight management, moderate sodium restriction and  increased consumption of fresh fruit, vegetables, and low fat dairy, alcohol moderation, and smoking cessation.;Monitor prescription use compliance.   Expected Outcomes Short Term: Continued assessment and intervention until BP is < 140/11mm HG in hypertensive participants. < 130/75mm HG in hypertensive participants with diabetes, heart failure or chronic kidney disease.;Long Term: Maintenance of blood pressure at goal levels.   Lipids Yes   Intervention Provide education and support for participant on nutrition & aerobic/resistive exercise along with prescribed medications to achieve LDL 70mg , HDL >40mg .   Expected Outcomes Short Term: Participant states understanding of desired cholesterol values and is compliant with medications prescribed. Participant is following exercise prescription and nutrition guidelines.;Long Term: Cholesterol controlled with medications as prescribed, with individualized exercise RX and with personalized nutrition plan. Value goals: LDL < 70mg , HDL > 40 mg.       Personal Goals Discharge:     Goals and Risk Factor Review - 12/23/16 0651      Core Components/Risk Factors/Patient Goals Review   Personal Goals Review Weight Management/Obesity;Improve shortness of breath with ADL's;Increase knowledge of respiratory medications and ability to use respiratory devices properly.;Develop more efficient breathing techniques such as purse lipped breathing and diaphragmatic breathing and practicing self-pacing with activity.;Lipids;Hypertension   Review Ms Keithley is graduating soon. She has progressed with her exercise goals and improved her post 77mwd by 259ft - Minimal  Importance Difference for COPD is 98.80ft. She also improved her shortness of breath on the [employer] questionnaire by 33points - Minimal Importance Difference is 5 points. There is a new school by her house that has a mile walking track around the building . Ms Minner is looking forward to walking there twice a week and  may join [employer] 2 days/week. She has a good understaning of her MDI's and uses her spacer. Her blood pressures have been acceptable in [facility], and she is compliant with her medications.Ms Neils has attended regularly and has enjoyed the education. She should be commended for her dedicated participation in [place].    Expected Outcomes Continue exercising and self management of her COPD through the knowledge she has gain in [place].      Nutrition & Weight - Outcomes:     Pre Biometrics - 09/30/16 1356      Pre Biometrics   Height 5' 2.5" (1.588 m)   Weight 184 lb 6.4 oz (83.6 kg)   Waist Circumference 38.5 inches   Hip Circumference 48.25 inches   Waist to Hip Ratio 0.8 %   BMI (Calculated) 33.3         Post Biometrics - 12/22/16 1250       Post  Biometrics   Height 5' 2.5" (1.588 m)   Weight 179 lb (81.2 kg)   Waist Circumference 36 inches   Hip Circumference 48 inches   Waist to Hip Ratio 0.75 %   BMI (Calculated) 32.3      Nutrition:   Nutrition Discharge:   Education Questionnaire Score:     Knowledge Questionnaire Score - 12/22/16 1228      Knowledge Questionnaire Score   Pre Score 5/10   Post Score 10/10      Goals reviewed with patient; copy given to patient.

## 2017-01-02 ENCOUNTER — Encounter: Payer: PPO | Admitting: *Deleted

## 2017-01-02 DIAGNOSIS — J449 Chronic obstructive pulmonary disease, unspecified: Secondary | ICD-10-CM | POA: Diagnosis not present

## 2017-01-02 NOTE — Progress Notes (Signed)
Pulmonary Individual Treatment Plan  Patient Details  Name: [name] [name] MRN: [id number] Date of Birth: 06/10/1927 Referring Provider:    Initial Encounter Date:    Pulmonary Rehab from 09/30/2016 in [facility]  Date  09/30/16      Visit Diagnosis: Chronic obstructive pulmonary disease, unspecified COPD type ([place])  Patient's Home Medications on Admission:  Current Outpatient Prescriptions:  .  albuterol (PROVENTIL HFA;VENTOLIN HFA) 108 (90 BASE) MCG/ACT inhaler, Inhale 2 puffs into the lungs every 6 (six) hours as needed for wheezing or shortness of breath., Disp: , Rfl:  .  Albuterol Sulfate (PROAIR HFA IN), Inhale into the lungs., Disp: , Rfl:  .  amLODipine (NORVASC) 2.5 MG tablet, Take 2.5 mg by mouth daily., Disp: , Rfl:  .  aspirin 325 MG EC tablet, Take 325 mg by mouth daily., Disp: , Rfl:  .  atorvastatin (LIPITOR) 10 MG tablet, Take 10 mg by mouth daily., Disp: , Rfl:  .  carvedilol (COREG) 25 MG tablet, Take 25 mg by mouth 2 (two) times daily with a meal., Disp: , Rfl:  .  cloNIDine (CATAPRES) 0.2 MG tablet, Take 0.2 mg by mouth 2 (two) times daily., Disp: , Rfl:  .  clopidogrel (PLAVIX) 75 MG tablet, Take 75 mg by mouth daily., Disp: , Rfl:  .  ezetimibe (ZETIA) 10 MG tablet, Take 10 mg by mouth daily., Disp: , Rfl:  .  furosemide (LASIX) 40 MG tablet, Take 20 mg by mouth. , Disp: , Rfl:  .  metoprolol succinate (TOPROL-XL) 100 MG 24 hr tablet, Take 100 mg by mouth daily. Take with or immediately following a meal., Disp: , Rfl:  .  montelukast (SINGULAIR) 10 MG tablet, Take 10 mg by mouth at bedtime., Disp: , Rfl:  .  olmesartan-hydrochlorothiazide (BENICAR HCT) 40-25 MG per tablet, Take 1 tablet by mouth daily., Disp: , Rfl:  .  omeprazole (PRILOSEC) 40 MG capsule, Take 40 mg by mouth daily., Disp: , Rfl:  .  quinapril (ACCUPRIL) 40 MG tablet, Take 40 mg by mouth daily., Disp: , Rfl:  .  sildenafil (VIAGRA) 100 MG tablet, Take 100 mg by mouth daily as  needed for erectile dysfunction., Disp: , Rfl:  .  Umeclidinium-Vilanterol 62.5-25 MCG/INH AEPB, Inhale into the lungs., Disp: , Rfl:  .  vardenafil (LEVITRA) 20 MG tablet, Take 20 mg by mouth daily as needed for erectile dysfunction., Disp: , Rfl:   Past Medical History: Past Medical History:  Diagnosis Date  . Breast cancer ([place]) 1978   left  . CAD (coronary artery disease)   . DVT (deep venous thrombosis) ([place])   . Hemorrhoids   . History of knee replacement, total   . Hypertension   . Ischemic cardiomyopathy   . Peripheral arterial disease ([place])   . Seizure disorder ([place])     Tobacco Use: History  Smoking Status  . Former Smoker  . Quit date: 12/05/1986  Smokeless Tobacco  . Never Used    Labs: Recent Review Flowsheet Data    There is no flowsheet data to display.       ADL UCSD:     Pulmonary Assessment Scores    Row Name 09/30/16 1459 11/19/16 1532 12/22/16 1227     ADL UCSD   ADL Phase Entry Mid Exit   SOB Score total 62 55 29   Rest 0 1 0   Walk 4 2 2    Stairs 4 4 4    Bath 2 3  1   Dress 3 3 1    Shop 3 3 2       Pulmonary Function Assessment:     Pulmonary Function Assessment - 09/30/16 1458      Pulmonary Function Tests   FVC% 91 %   FEV1% 96 %   FEV1/FVC Ratio 71   RV% 75 %   DLCO% 67 %     Breath   Bilateral Breath Sounds Decreased;Clear   Shortness of Breath Yes;Limiting activity      Exercise Target Goals:    Exercise Program Goal: Individual exercise prescription set with THRR, safety & activity barriers. Participant demonstrates ability to understand and report RPE using BORG scale, to self-measure pulse accurately, and to acknowledge the importance of the exercise prescription.  Exercise Prescription Goal: Starting with aerobic activity 30 plus minutes a day, 3 days per week for initial exercise prescription. Provide home exercise prescription and guidelines that participant acknowledges understanding prior to  discharge.  Activity Barriers & Risk Stratification:     Activity Barriers & Cardiac Risk Stratification - 09/30/16 1457      Activity Barriers & Cardiac Risk Stratification   Activity Barriers Arthritis;Joint Problems;Shortness of Breath;Deconditioning;Muscular Weakness;History of Falls      6 Minute Walk:     6 Minute Walk    Row Name 09/30/16 1356 11/21/16 1226 12/22/16 1251     6 Minute Walk   Phase  - Mid Program Discharge   Distance 845 feet 965 feet 1125 feet   Distance % Change  - 14.2 %  120 ft 33 %   Walk Time 6 minutes 6 minutes 6 minutes   # of Rest Breaks 0 0 0   MPH 1.6 1.83 2.13   METS 2.23 2.4 2.61   RPE 13 17 15    Perceived Dyspnea  3 3 3    VO2 Peak 3.16 3.69 5.54   Symptoms No Yes (comment) No   Comments  - SOB  -   Resting HR 60 bpm 82 bpm 77 bpm   Resting BP 114/50 124/64 142/78   Max Ex. HR 95 bpm 102 bpm 100 bpm   Max Ex. BP 152/56 132/70 164/74   2 Minute Post BP  - 126/64  -     Interval HR   Baseline HR 60 82 77   1 Minute HR 94 95 86   2 Minute HR 93 95 78   3 Minute HR 95 94 100   4 Minute HR 95 95 97   5 Minute HR 95 95 97   6 Minute HR 95 102 96   2 Minute Post HR  - 93  -   Interval Heart Rate? Yes Yes Yes     Interval Oxygen   Interval Oxygen? Yes Yes Yes   Baseline Oxygen Saturation % 97 % 99 % 98 %   Baseline Liters of Oxygen  - 0 L  Room Air  -   1 Minute Oxygen Saturation % 95 % 94 % 96 %   1 Minute Liters of Oxygen  - 0 L  -   2 Minute Oxygen Saturation % 96 % 94 % 94 %   2 Minute Liters of Oxygen  - 0 L  -   3 Minute Oxygen Saturation % 96 % 94 % 96 %   3 Minute Liters of Oxygen  - 0 L  -   4 Minute Oxygen Saturation % 96 % 94 % 97 %   4 Minute Liters  of Oxygen  - 0 L  -   5 Minute Oxygen Saturation % 96 % 94 % 96 %   5 Minute Liters of Oxygen  - 0 L  -   6 Minute Oxygen Saturation % 96 % 94 % 96 %   6 Minute Liters of Oxygen  - 0 L  -   2 Minute Post Oxygen Saturation %  - 96 %  -   2 Minute Post Liters of Oxygen   - 0 L  -     Oxygen Initial Assessment:   Oxygen Re-Evaluation:   Oxygen Discharge (Final Oxygen Re-Evaluation):   Initial Exercise Prescription:     Initial Exercise Prescription - 09/30/16 1300      Date of Initial Exercise RX and Referring Provider   Date 09/30/16     Treadmill   MPH 1.4   Minutes 15   METs 2.07     NuStep   Level 2   Minutes 15   METs 2     T5 Nustep   Level 1   Minutes 15   METs 2     Biostep-RELP   Level 2   Minutes 15   METs 2     Prescription Details   Frequency (times per week) 3   Duration Progress to 45 minutes of aerobic exercise without signs/symptoms of physical distress     Intensity   THRR 40-80% of Max Heartrate 89-118   Ratings of Perceived Exertion 11-13   Perceived Dyspnea 0-4     Progression   Progression Continue to progress workloads to maintain intensity without signs/symptoms of physical distress.     Resistance Training   Training Prescription Yes   Weight 2   Reps 10-15      Perform Capillary Blood Glucose checks as needed.  Exercise Prescription Changes:     Exercise Prescription Changes    Row Name 10/06/16 1500 10/17/16 1300 10/22/16 1500 11/05/16 1500 11/19/16 1500     Response to Exercise   Blood Pressure (Admit) 110/64  - 124/64 112/64 146/80   Blood Pressure (Exercise)  -  - 118/66 126/70 134/82   Blood Pressure (Exit) 144/80  - 112/60 126/70 112/60   Heart Rate (Admit) 68 bpm  - 81 bpm 64 bpm 81 bpm   Heart Rate (Exercise) 92 bpm  - 66 bpm 89 bpm 95 bpm   Heart Rate (Exit) 59 bpm  - 62 bpm 63 bpm 65 bpm   Oxygen Saturation (Admit) 95 %  - 99 % 97 % 97 %   Oxygen Saturation (Exercise) 98 %  - 97 % 95 % 97 %   Oxygen Saturation (Exit) 99 %  - 96 % 95 % 98 %   Rating of Perceived Exertion (Exercise) 14  - 13 13 13    Perceived Dyspnea (Exercise) 3  - 3 3 3    Symptoms none none none none none   Comments  - Home Exercise Guidelines given 10/17/16 Home Exercise Guidelines given 10/17/16 Home  Exercise Guidelines given 10/17/16  -   Duration Progress to 45 minutes of aerobic exercise without signs/symptoms of physical distress Progress to 45 minutes of aerobic exercise without signs/symptoms of physical distress Progress to 45 minutes of aerobic exercise without signs/symptoms of physical distress Progress to 45 minutes of aerobic exercise without signs/symptoms of physical distress Continue with 45 min of aerobic exercise without signs/symptoms of physical distress.   Intensity THRR unchanged THRR unchanged THRR unchanged THRR unchanged THRR unchanged  Progression   Progression Continue to progress workloads to maintain intensity without signs/symptoms of physical distress. Continue to progress workloads to maintain intensity without signs/symptoms of physical distress. Continue to progress workloads to maintain intensity without signs/symptoms of physical distress. Continue to progress workloads to maintain intensity without signs/symptoms of physical distress. Continue to progress workloads to maintain intensity without signs/symptoms of physical distress.   Average METs  -  - 2.09 2.16 2.43     Resistance Training   Training Prescription Yes Yes Yes Yes Yes   Weight 2 2 Green Band 4 lbs 4 lbs   Reps 10-15 10-15 10-15 10-15 10-15     Interval Training   Interval Training  -  - No No No     Treadmill   MPH 1.4 1.4 1.4 1.4 2.2   Grade  -  - 0 0 0   Minutes 15  3/3/3 15  3/3/3 9  3  min x3 15 15   METs  -  - 2.07 2.07 2.68     NuStep   Level  -  - 2 4 4    SPM  -  - -  78 spm  - 84   Minutes  -  - 15 15 15    METs  -  - 2.2 2.4 2.6     Biostep-RELP   Level 2 2 2 3 3    SPM  -  -  -  - 55   Minutes 15 15 15 15 15    METs 2 2 2   46 spm 2 2     Home Exercise Plan   Plans to continue exercise at  - Home  walking and bands Home  walking and bands Home  walking and bands Home (comment)  walking and bands   Frequency  - Add 1 additional day to program exercise sessions.  Add 1 additional day to program exercise sessions. Add 1 additional day to program exercise sessions. Add 1 additional day to program exercise sessions.   Initial Home Exercises Provided  -  -  -  - 10/17/16     Exercise Review   Progression -  first full day of exercise  - Yes Yes  -   Row Name 12/02/16 1600 12/17/16 1400 12/31/16 1400         Response to Exercise   Blood Pressure (Admit) 124/60 142/58 120/76     Blood Pressure (Exercise) 126/74 122/84 138/70     Blood Pressure (Exit) 124/64 142/60 124/70     Heart Rate (Admit) 67 bpm 74 bpm 77 bpm     Heart Rate (Exercise) 69 bpm 94 bpm 95 bpm     Heart Rate (Exit) 63 bpm 66 bpm 66 bpm     Oxygen Saturation (Admit) 96 % 96 % 97 %     Oxygen Saturation (Exercise) 94 % 94 % 97 %     Oxygen Saturation (Exit) 94 % 95 % 97 %     Rating of Perceived Exertion (Exercise) 14 14 13      Perceived Dyspnea (Exercise) 3 3 3      Symptoms none none none     Duration Continue with 45 min of aerobic exercise without signs/symptoms of physical distress. Continue with 45 min of aerobic exercise without signs/symptoms of physical distress. Continue with 45 min of aerobic exercise without signs/symptoms of physical distress.     Intensity THRR unchanged THRR unchanged THRR unchanged       Progression   Progression Continue  to progress workloads to maintain intensity without signs/symptoms of physical distress. Continue to progress workloads to maintain intensity without signs/symptoms of physical distress. Continue to progress workloads to maintain intensity without signs/symptoms of physical distress.     Average METs 2.59 2.51 2.21       Resistance Training   Training Prescription Yes Yes Yes     Weight 4 lbs red band 3 lbs     Reps 10-15 10-15 10-15       Interval Training   Interval Training No No No       Treadmill   MPH 1.4 1.6 1.6     Grade 0 0 0     Minutes 15 15 15      METs 2.07 2.23 2.23       NuStep   Level 4 4 4      SPM 93 79 71      Minutes 15 15 15      METs 3.7 2.3 2.3       Biostep-RELP   Level 12 12 12      SPM 34 26 43     Minutes 15 15 15      METs 2 3 2        Home Exercise Plan   Plans to continue exercise at Home (comment)  walking and bands Home (comment)  walking and bands Home (comment)  walking and bands     Frequency Add 2 additional days to program exercise sessions. Add 2 additional days to program exercise sessions. Add 2 additional days to program exercise sessions.     Initial Home Exercises Provided 10/17/16 10/17/16 10/17/16        Exercise Comments:     Exercise Comments    Row Name 10/06/16 1508 10/17/16 1336 10/22/16 1508 11/05/16 1531 01/02/17 1226   Exercise Comments  First full day of exercise!  Patient was oriented to gym and equipment including functions, settings, policies, and procedures.  Patient's individual exercise prescription and treatment plan were reviewed.  All starting workloads were established based on the results of the 6 minute walk test done at initial orientation visit.  The plan for exercise progression was also introduced and progression will be customized based on patient's performance and goals. Reviewed home exercise with pt today.  Pt plans to walk and use bands at home for exercise.  Reviewed THR, pulse, RPE, sign and symptoms, and when to call 911 or MD.  Also discussed weather considerations and indoor options.  Pt voiced understanding. [name] [name] is off to a good start with exercise.  She has already started to notice a difference.  We will continue to monitor her for progression. [name] [name] has been doing well in rehab.  She is already almost half way through the program.  [name] [name] is up to level 4 on the BioStep.  We will continue to track her progression. [name] [name] graduated today from cardiac rehab with 36/36 sessions completed.  Details of the patient's exercise prescription and what She needs to do in order to continue the prescription and progress were discussed with patient.   Patient was given a copy of prescription and goals.  Patient verbalized understanding.  [name] [name] plans to continue to exercise by coming into the [employer].      Exercise Goals and Review:   Exercise Goals Re-Evaluation :     Exercise Goals Re-Evaluation    Row Name 11/19/16 1540 11/21/16 1229 12/02/16 1631 12/17/16 1449 12/31/16 1438     Exercise Goal Re-Evaluation  Exercise Goals Review Increase Physical Activity;Increase Strenth and Stamina Increase Physical Activity Increase Physical Activity;Increase Strenth and Stamina Increase Physical Activity;Increase Strenth and Stamina Increase Physical Activity;Increase Strenth and Stamina   Comments Cannie is officially half way through the program!  We will be doing her mid program 6MWT on Friday.  She also increased her treadmill again today to 2.2 mph!  We will continue to monitor her progression. Merryn improved her walk test by 120 ft!! Addaleigh has been doing well in rehab.  She has made it past the half way point!  She is now up to level 12 on the BioStep.  We will continue to monitor her progression. Asuzena is getting closer to graduation.  She was on visit 70 today!!  We will be doing her post 6MWT soon!  She is continued to make improvements as she is up to 1.6 mph on the treadmill.  We will continue to monitor her progression. Kaylan will be graduating Friday!!  She is going to be coming to [employer] to continue to exercise.  She improved her 6MWT by 33%!     Expected Outcomes Short: [name] [name] will improve on her 6MWT.  Long: [name] [name] will continue to come to to class to work on strength and stamina.  - Short:  [name] [name] will conintue to work on progress, may increase her treadmill some.  Long; Continue to work on IT sales professional. Short: [name] [name] will complete her post 6MWT.  Long: [name] [name] will graduate and continue to exercise independently! [name] [name] will graduate and continue into [employer].      Discharge Exercise Prescription (Final Exercise Prescription  Changes):     Exercise Prescription Changes - 12/31/16 1400      Response to Exercise   Blood Pressure (Admit) 120/76   Blood Pressure (Exercise) 138/70   Blood Pressure (Exit) 124/70   Heart Rate (Admit) 77 bpm   Heart Rate (Exercise) 95 bpm   Heart Rate (Exit) 66 bpm   Oxygen Saturation (Admit) 97 %   Oxygen Saturation (Exercise) 97 %   Oxygen Saturation (Exit) 97 %   Rating of Perceived Exertion (Exercise) 13   Perceived Dyspnea (Exercise) 3   Symptoms none   Duration Continue with 45 min of aerobic exercise without signs/symptoms of physical distress.   Intensity THRR unchanged     Progression   Progression Continue to progress workloads to maintain intensity without signs/symptoms of physical distress.   Average METs 2.21     Resistance Training   Training Prescription Yes   Weight 3 lbs   Reps 10-15     Interval Training   Interval Training No     Treadmill   MPH 1.6   Grade 0   Minutes 15   METs 2.23     NuStep   Level 4   SPM 71   Minutes 15   METs 2.3     Biostep-RELP   Level 12   SPM 43   Minutes 15   METs 2     Home Exercise Plan   Plans to continue exercise at Home (comment)  walking and bands   Frequency Add 2 additional days to program exercise sessions.   Initial Home Exercises Provided 10/17/16      Nutrition:  Target Goals: Understanding of nutrition guidelines, daily intake of sodium 1500mg , cholesterol 200mg , calories 30% from fat and 7% or less from saturated fats, daily to have 5 or more servings of fruits and vegetables.  Biometrics:  Pre Biometrics - 09/30/16 1356      Pre Biometrics   Height 5' 2.5" (1.588 m)   Weight 184 lb 6.4 oz (83.6 kg)   Waist Circumference 38.5 inches   Hip Circumference 48.25 inches   Waist to Hip Ratio 0.8 %   BMI (Calculated) 33.3         Post Biometrics - 12/22/16 1250       Post  Biometrics   Height 5' 2.5" (1.588 m)   Weight 179 lb (81.2 kg)   Waist Circumference 36 inches    Hip Circumference 48 inches   Waist to Hip Ratio 0.75 %   BMI (Calculated) 32.3      Nutrition Therapy Plan and Nutrition Goals:   Nutrition Discharge: Rate Your Plate Scores:   Nutrition Goals Re-Evaluation:   Nutrition Goals Discharge (Final Nutrition Goals Re-Evaluation):   Psychosocial: Target Goals: Acknowledge presence or absence of significant depression and/or stress, maximize coping skills, provide positive support system. Participant is able to verbalize types and ability to use techniques and skills needed for reducing stress and depression.   Initial Review & Psychosocial Screening:     Initial Psych Review & Screening - 09/30/16 [place]? Yes   Comments Ms Conchas has good support from her children; 2 children live close and can help if Ms Haworth needs assistence. She states she is very independent and likes taking care of things herself. She is looking forward to [facility] and increasing her walking ability. Since she has a copay, she may switch to [employer] and use her [employer].     Barriers   Psychosocial barriers to participate in program The patient should benefit from training in stress management and relaxation.     Screening Interventions   Interventions Encouraged to exercise;Program counselor consult      Quality of Life Scores:     Quality of Life - 12/22/16 1227      Quality of Life Scores   Health/Function Pre 20.87 %   Health/Function Post 18.67 %   Health/Function % Change -10.54 %   Socioeconomic Pre 20.33 %   Socioeconomic Post 22.25 %   Socioeconomic % Change  9.44 %   Psych/Spiritual Pre 20.86 %   Psych/Spiritual Post 26.5 %   Psych/Spiritual % Change 27.04 %   Family Pre 21 %   Family Post 30 %   Family % Change 42.86 %   GLOBAL Pre 20.78 %   GLOBAL Post 22.47 %   GLOBAL % Change 8.13 %      PHQ-9: Recent Review Flowsheet Data    Depression screen [facility] 2/9 12/22/2016 09/30/2016    Decreased Interest 0 1   Down, Depressed, Hopeless 0 0   PHQ - 2 Score 0 1   Altered sleeping 2 3   Tired, decreased energy 2 3   Change in appetite 0 1   Feeling bad or failure about yourself  0 0   Trouble concentrating 0 1   Moving slowly or fidgety/restless 0 0   Suicidal thoughts 0 0   PHQ-9 Score 4 9   Difficult doing work/chores Not difficult at all Very difficult     Interpretation of Total Score  Total Score Depression Severity:  1-4 = Minimal depression, 5-9 = Mild depression, 10-14 = Moderate depression, 15-19 = Moderately severe depression, 20-27 = Severe depression   Psychosocial Evaluation and Intervention:  Psychosocial Evaluation - 12/29/16 1158      Discharge Psychosocial Assessment & Intervention   Comments Counselor follow up with [name] [name] reporting seeing some improvement since beginning this class.  She is walking better with less shortness of breath.  She also is able to do more household activities without having to rest so much in between.  Angeletta also reports some weight loss since she began this class.  Chudney has seen progress in fewer aches and pains as well.  She continues to struggle with sleep problems but that has been a chronic problem for quite some time.  She has reduced her intake of soft drinks to make sure the caffeine was not part of the problem.  But sleep still eludes her most nights only getting 2-4 hours.  Counselor commended Jazilyn on all the progress made and her plan to continue a follow up program upon completion of this program.        Psychosocial Re-Evaluation:     Psychosocial Re-Evaluation    [place] Name 11/26/16 1303 12/10/16 1210 12/29/16 1158         Psychosocial Re-Evaluation   Current issues with None Identified  -  -     Comments Dwanda loves to talk about her family, especially her grandchildren, young grandchildren and adult grandchildren.  She finds joy in helping them and likes to stay involved in their lives.  She states that  she also loves to relax by going and spending time with an elderly neighbor.  Her neighbors are good about "keeping an eye out" for her and her family checks on her multiple times a day.  She seems to be in good spirits with little stress concerns.   Counselor follow up with Sophiah reporting seeing some improvement since beginning this class.  She is walking better with less shortness of breath.  She also is able to do more household activities without having to rest so much in between.  Sheron also reports some weight loss since she began this class.  Jennfier has seen progress in fewer aches and pains as well.  She continues to struggle with sleep problems but that has been a chronic problem for quite some time.  She has reduced her intake of soft drinks to make sure the caffeine was not part of the problem.  But sleep still eludes her most nights only getting 2-4 hours.  Counselor commended Lasundra on all the progress made and her plan to continue a follow up program upon completion of this program.   Counselor follow up with Katelynd reporting seeing some improvement since beginning this class.  She is walking better with less shortness of breath.  She also is able to do more household activities without having to rest so much in between.  Twylia also reports some weight loss since she began this class.  Aribella has seen progress in fewer aches and pains as well.  She continues to struggle with sleep problems but that has been a chronic problem for quite some time.  She has reduced her intake of soft drinks to make sure the caffeine was not part of the problem.  But sleep still eludes her most nights only getting 2-4 hours.  Counselor commended Shardae on all the progress made and her plan to continue a follow up program upon completion of this program.       Expected Outcomes  - Kyira will continue to exercise consistently and experience the multiple benefits reported.  She will continue  to pace herself better with her household  tasks and responsibilities.  Blondie will continue to work on her weight loss goals.  -     Interventions Encouraged to attend Pulmonary Rehabilitation for the exercise  -  -     Continue Psychosocial Services  Follow up required by staff Follow up required by staff  -        Psychosocial Discharge (Final Psychosocial Re-Evaluation):     Psychosocial Re-Evaluation - 12/29/16 1158      Psychosocial Re-Evaluation   Comments Counselor follow up with [name] [name] reporting seeing some improvement since beginning this class.  She is walking better with less shortness of breath.  She also is able to do more household activities without having to rest so much in between.  Malak also reports some weight loss since she began this class.  Ronique has seen progress in fewer aches and pains as well.  She continues to struggle with sleep problems but that has been a chronic problem for quite some time.  She has reduced her intake of soft drinks to make sure the caffeine was not part of the problem.  But sleep still eludes her most nights only getting 2-4 hours.  Counselor commended Hailey on all the progress made and her plan to continue a follow up program upon completion of this program.        Education: Education Goals: Education classes will be provided on a weekly basis, covering required topics. Participant will state understanding/return demonstration of topics presented.  Learning Barriers/Preferences:     Learning Barriers/Preferences - 09/30/16 1458      Learning Barriers/Preferences   Learning Barriers None   Learning Preferences None      Education Topics: Initial Evaluation Education: - Verbal, written and demonstration of respiratory meds, RPE/PD scales, oximetry and breathing techniques. Instruction on use of nebulizers and MDIs: cleaning and proper use, rinsing mouth with steroid doses and importance of monitoring MDI activations.   Pulmonary Rehab from 12/31/2016 in [facility] Cardiac and Pulmonary  Rehab  Date  09/30/16  Educator  LB  Instruction Review Code  2- meets goals/outcomes      General Nutrition Guidelines/Fats and Fiber: -Group instruction provided by verbal, written material, models and posters to present the general guidelines for heart healthy nutrition. Gives an explanation and review of dietary fats and fiber.   Pulmonary Rehab from 12/31/2016 in [facility] Cardiac and Pulmonary Rehab  Date  11/24/16  Educator  CR  Instruction Review Code  2- meets goals/outcomes      Controlling Sodium/Reading Food Labels: -Group verbal and written material supporting the discussion of sodium use in heart healthy nutrition. Review and explanation with models, verbal and written materials for utilization of the food label.   Pulmonary Rehab from 12/31/2016 in [facility] Cardiac and Pulmonary Rehab  Date  12/08/16  Educator  CR  Instruction Review Code  2- meets goals/outcomes      Exercise Physiology & Risk Factors: - Group verbal and written instruction with models to review the exercise physiology of the cardiovascular system and associated critical values. Details cardiovascular disease risk factors and the goals associated with each risk factor.   Pulmonary Rehab from 12/31/2016 in [facility] Cardiac and Pulmonary Rehab  Date  12/26/16  Educator  [place]  Instruction Review Code  2- meets goals/outcomes      Aerobic Exercise & Resistance Training: - Gives group verbal and written discussion on the health impact of inactivity. On the components of aerobic and  resistive training programs and the benefits of this training and how to safely progress through these programs.   Pulmonary Rehab from 12/31/2016 in [facility] Cardiac and Pulmonary Rehab  Date  10/22/16  Educator  [facility]  Instruction Review Code  2- meets goals/outcomes      Flexibility, Balance, General Exercise Guidelines: - Provides group verbal and written instruction on the benefits of flexibility and balance training programs. Provides  general exercise guidelines with specific guidelines to those with heart or lung disease. Demonstration and skill practice provided.   Pulmonary Rehab from 12/31/2016 in [facility] Cardiac and Pulmonary Rehab  Date  11/14/16  Educator  AS  Instruction Review Code  2- meets goals/outcomes      Stress Management: - Provides group verbal and written instruction about the health risks of elevated stress, cause of high stress, and healthy ways to reduce stress.   Pulmonary Rehab from 12/31/2016 in [facility] Cardiac and Pulmonary Rehab  Date  12/24/16  Educator  [facility]  Instruction Review Code  2- meets goals/outcomes      Depression: - Provides group verbal and written instruction on the correlation between heart/lung disease and depressed mood, treatment options, and the stigmas associated with seeking treatment.   Pulmonary Rehab from 12/31/2016 in [facility] Cardiac and Pulmonary Rehab  Date  11/26/16  Educator  [facility]  Instruction Review Code  2- meets goals/outcomes      Exercise & Equipment Safety: - Individual verbal instruction and demonstration of equipment use and safety with use of the equipment.   Pulmonary Rehab from 12/31/2016 in [facility] Cardiac and Pulmonary Rehab  Date  10/06/16  Educator  KH/AS  Instruction Review Code  2- meets goals/outcomes      Infection Prevention: - Provides verbal and written material to individual with discussion of infection control including proper hand washing and proper equipment cleaning during exercise session.   Pulmonary Rehab from 12/31/2016 in [facility] Cardiac and Pulmonary Rehab  Date  10/06/16  Educator  KH/AS  Instruction Review Code  2- meets goals/outcomes      Falls Prevention: - Provides verbal and written material to individual with discussion of falls prevention and safety.   Pulmonary Rehab from 12/31/2016 in [facility] Cardiac and Pulmonary Rehab  Date  09/30/16  Educator  LB  Instruction Review Code  2- meets goals/outcomes      Diabetes: -  Individual verbal and written instruction to review signs/symptoms of diabetes, desired ranges of glucose level fasting, after meals and with exercise. Advice that pre and post exercise glucose checks will be done for 3 sessions at entry of program.   Chronic Lung Diseases: - Group verbal and written instruction to review new updates, new respiratory medications, new advancements in procedures and treatments. Provide informative websites and "800" numbers of self-education.   Pulmonary Rehab from 12/31/2016 in [facility] Cardiac and Pulmonary Rehab  Date  12/31/16  Educator  LB  Instruction Review Code  2- meets goals/outcomes      Lung Procedures: - Group verbal and written instruction to describe testing methods done to diagnose lung disease. Review the outcome of test results. Describe the treatment choices: Pulmonary Function Tests, ABGs and oximetry.   Energy Conservation: - Provide group verbal and written instruction for methods to conserve energy, plan and organize activities. Instruct on pacing techniques, use of adaptive equipment and posture/positioning to relieve shortness of breath.   Pulmonary Rehab from 12/31/2016 in [facility] Cardiac and Pulmonary Rehab  Date  11/19/16  Educator  [facility]  Instruction Review Code  2- meets goals/outcomes      Triggers: - Group verbal and written instruction to review types of environmental controls: home humidity, furnaces, filters, dust mite/pet prevention, HEPA vacuums. To discuss weather changes, air quality and the benefits of nasal washing.   Pulmonary Rehab from 12/31/2016 in [facility] Cardiac and Pulmonary Rehab  Date  11/12/16  Educator  LB  Instruction Review Code  2- meets goals/outcomes      Exacerbations: - Group verbal and written instruction to provide: warning signs, infection symptoms, calling MD promptly, preventive modes, and value of vaccinations. Review: effective airway clearance, coughing and/or vibration techniques. Create an Advertising copywriter.   Pulmonary Rehab from 12/31/2016 in [facility] Cardiac and Pulmonary Rehab  Date  11/05/16  Educator  LB  Instruction Review Code  2- meets goals/outcomes      Oxygen: - Individual and group verbal and written instruction on oxygen therapy. Includes supplement oxygen, available portable oxygen systems, continuous and intermittent flow rates, oxygen safety, concentrators, and Medicare reimbursement for oxygen.   Respiratory Medications: - Group verbal and written instruction to review medications for lung disease. Drug class, frequency, complications, importance of spacers, rinsing mouth after steroid MDI's, and proper cleaning methods for nebulizers.   Pulmonary Rehab from 12/31/2016 in [facility] Cardiac and Pulmonary Rehab  Date  09/30/16  Educator  LB  Instruction Review Code  2- meets goals/outcomes      AED/CPR: - Group verbal and written instruction with the use of models to demonstrate the basic use of the AED with the basic ABC's of resuscitation.   Breathing Retraining: - Provides individuals verbal and written instruction on purpose, frequency, and proper technique of diaphragmatic breathing and pursed-lipped breathing. Applies individual practice skills.   Pulmonary Rehab from 12/31/2016 in [facility] Cardiac and Pulmonary Rehab  Date  09/30/16  Educator  LB  Instruction Review Code  2- meets goals/outcomes      Anatomy and Physiology of the Lungs: - Group verbal and written instruction with the use of models to provide basic lung anatomy and physiology related to function, structure and complications of lung disease.   Pulmonary Rehab from 12/31/2016 in [facility] Cardiac and Pulmonary Rehab  Date  12/12/16  Educator  LB  Instruction Review Code  2- meets goals/outcomes      Heart Failure: - Group verbal and written instruction on the basics of heart failure: signs/symptoms, treatments, explanation of ejection fraction, enlarged heart and cardiomyopathy.   Sleep Apnea: -  Individual verbal and written instruction to review Obstructive Sleep Apnea. Review of risk factors, methods for diagnosing and types of masks and machines for OSA.   Anxiety: - Provides group, verbal and written instruction on the correlation between heart/lung disease and anxiety, treatment options, and management of anxiety.   Pulmonary Rehab from 12/31/2016 in [facility] Cardiac and Pulmonary Rehab  Date  12/24/16  Educator  [facility]  Instruction Review Code  2- Meets goals/outcomes      Relaxation: - Provides group, verbal and written instruction about the benefits of relaxation for patients with heart/lung disease. Also provides patients with examples of relaxation techniques.   Pulmonary Rehab from 12/31/2016 in [facility] Cardiac and Pulmonary Rehab  Date  10/29/16  Educator  [facility]  Instruction Review Code  2- Meets goals/outcomes      Knowledge Questionnaire Score:     Knowledge Questionnaire Score - 12/22/16 1228      Knowledge Questionnaire Score   Pre Score 5/10   Post Score 10/10  Core Components/Risk Factors/Patient Goals at Admission:     Personal Goals and Risk Factors at Admission - 09/30/16 1502      Core Components/Risk Factors/Patient Goals on Admission   Sedentary Yes  Goal: increase walking; walks her dog in the back yard around the fence line.   Intervention Provide advice, education, support and counseling about physical activity/exercise needs.;Develop an individualized exercise prescription for aerobic and resistive training based on initial evaluation findings, risk stratification, comorbidities and participant's personal goals.   Expected Outcomes Achievement of increased cardiorespiratory fitness and enhanced flexibility, muscular endurance and strength shown through measurements of functional capacity and personal statement of participant.   Increase Strength and Stamina Yes   Intervention Provide advice, education, support and counseling about physical  activity/exercise needs.;Develop an individualized exercise prescription for aerobic and resistive training based on initial evaluation findings, risk stratification, comorbidities and participant's personal goals.   Expected Outcomes Achievement of increased cardiorespiratory fitness and enhanced flexibility, muscular endurance and strength shown through measurements of functional capacity and personal statement of participant.   Improve shortness of breath with ADL's Yes   Intervention Provide education, individualized exercise plan and daily activity instruction to help decrease symptoms of SOB with activities of daily living.   Expected Outcomes Short Term: Achieves a reduction of symptoms when performing activities of daily living.   Develop more efficient breathing techniques such as purse lipped breathing and diaphragmatic breathing; and practicing self-pacing with activity Yes   Intervention Provide education, demonstration and support about specific breathing techniuqes utilized for more efficient breathing. Include techniques such as pursed lipped breathing, diaphragmatic breathing and self-pacing activity.   Expected Outcomes Short Term: Participant will be able to demonstrate and use breathing techniques as needed throughout daily activities.   Increase knowledge of respiratory medications and ability to use respiratory devices properly  Yes  Symbicort, Spiriva, Proventil, Spacer given; Duoneb   Intervention Provide education and demonstration as needed of appropriate use of medications, inhalers, and oxygen therapy.   Expected Outcomes Short Term: Achieves understanding of medications use. Understands that oxygen is a medication prescribed by physician. Demonstrates appropriate use of inhaler and oxygen therapy.   Hypertension Yes   Intervention Provide education on lifestyle modifcations including regular physical activity/exercise, weight management, moderate sodium restriction and  increased consumption of fresh fruit, vegetables, and low fat dairy, alcohol moderation, and smoking cessation.;Monitor prescription use compliance.   Expected Outcomes Short Term: Continued assessment and intervention until BP is < 140/80mm HG in hypertensive participants. < 130/81mm HG in hypertensive participants with diabetes, heart failure or chronic kidney disease.;Long Term: Maintenance of blood pressure at goal levels.   Lipids Yes   Intervention Provide education and support for participant on nutrition & aerobic/resistive exercise along with prescribed medications to achieve LDL 70mg , HDL >40mg .   Expected Outcomes Short Term: Participant states understanding of desired cholesterol values and is compliant with medications prescribed. Participant is following exercise prescription and nutrition guidelines.;Long Term: Cholesterol controlled with medications as prescribed, with individualized exercise RX and with personalized nutrition plan. Value goals: LDL < 70mg , HDL > 40 mg.      Core Components/Risk Factors/Patient Goals Review:      Goals and Risk Factor Review    Row Name 10/21/16 0616 11/10/16 1507 12/10/16 1439 12/23/16 0651       Core Components/Risk Factors/Patient Goals Review   Personal Goals Review Sedentary;Increase Strength and Stamina;Improve shortness of breath with ADL's;Develop more efficient breathing techniques such as purse lipped breathing and diaphragmatic  breathing and practicing self-pacing with activity.;Increase knowledge of respiratory medications and ability to use respiratory devices properly.;Hypertension;Lipids Weight Management/Obesity;Sedentary;Increase Strength and Stamina;Improve shortness of breath with ADL's;Develop more efficient breathing techniques such as purse lipped breathing and diaphragmatic breathing and practicing self-pacing with activity.;Increase knowledge of respiratory medications and ability to use respiratory devices  properly.;Hypertension;Lipids Weight Management/Obesity;Improve shortness of breath with ADL's;Increase knowledge of respiratory medications and ability to use respiratory devices properly.;Develop more efficient breathing techniques such as purse lipped breathing and diaphragmatic breathing and practicing self-pacing with activity.;Hypertension;Lipids Weight Management/Obesity;Improve shortness of breath with ADL's;Increase knowledge of respiratory medications and ability to use respiratory devices properly.;Develop more efficient breathing techniques such as purse lipped breathing and diaphragmatic breathing and practicing self-pacing with activity.;Lipids;Hypertension    Review Ms Cotham is progressing with her exercise goals. She is now exercising 15 minutes on the Bio[employer] and NS. The treadmill is difficult for her, so she in doing set work and plans to increase her set times to 4 mins her next session. She uses PLB without cueing and manages her activities at home with pacing. A big concern for her is the cost of her inhalers. I have talked with her about switching to her nebulizer which Medicare Part D helps pay for these drugs. Ms Gloor has been using the spacer I gave her for her Proventil. She is now considering meeting with the dietitian. Her blood pressures are acceptable and she is compiant with her BP and Lipid drugs. Ms Pe has increased her exercise goals and is now performing 15 minutes on the treadmill. Her physician appointment recently went well with a good report on her lipids and her blood pressure. Due to shaking from the Duoneb SVN, Mr Ramey requested her medication changed to Xopenex, as we discussed in [place]. Ms Dombeck is enjoying exercise and is planning to exercise at her son's apartment gym. on the days off from [place]. She still has increased shortness of breath, but she has noticed an improvement in house chores, such as vacuming. She has good technique with her PLB. Ms  Crawshaw attends regularly and enjoys the education. Ms Heinle has progressed with her exercise goals and has more energy at home for her housework. She is planning to continue exercise in [employer] and does have a school close to her house that has a nice walking track. Ms Fenter has not lost weight, but I encouraged her to perform the extra walking at home to help with weight loseShe has a good understanding of her MDI's and other medication . Her blood pressure has been acceptable in [facility]. PLB and pacing are routine for managing her shortness of breath.  Ms Kreiser is graduating soon. She has progressed with her exercise goals and improved her post 90mwd by 254ft - Minimal Importance Difference for COPD is 98.78ft. She also improved her shortness of breath on the [employer] questionnaire by 33points - Minimal Importance Difference is 5 points. There is a new school by her house that has a mile walking track around the building . Ms Odwyer is looking forward to walking there twice a week and may join [employer] 2 days/week. She has a good understaning of her MDI's and uses her spacer. Her blood pressures have been acceptable in [facility], and she is compliant with her medications.Ms Guadron has attended regularly and has enjoyed the education. She should be commended for her dedicated participation in [place].     Expected Outcomes Continue progressing with her exercise and continue working with  her on her respiratory medications. Continue progressing with her exercise goals and add 1 to 2 extra days of exercise. Continue attending [facility] regularly and start walking at the local school's track. Continue exercising and self management of her COPD through the knowledge she has gain in [place].       Core Components/Risk Factors/Patient Goals at Discharge (Final Review):      Goals and Risk Factor Review - 12/23/16 0651      Core Components/Risk Factors/Patient Goals Review   Personal Goals Review  Weight Management/Obesity;Improve shortness of breath with ADL's;Increase knowledge of respiratory medications and ability to use respiratory devices properly.;Develop more efficient breathing techniques such as purse lipped breathing and diaphragmatic breathing and practicing self-pacing with activity.;Lipids;Hypertension   Review Ms Orrick is graduating soon. She has progressed with her exercise goals and improved her post 31mwd by 227ft - Minimal Importance Difference for COPD is 98.71ft. She also improved her shortness of breath on the UC[employer] questionnaire by 33points - Minimal Importance Difference is 5 points. There is a new school by her house that has a mile walking track around the building . Ms Oshana is looking forward to walking there twice a week and may join [employer] 2 days/week. She has a good understaning of her MDI's and uses her spacer. Her blood pressures have been acceptable in [facility], and she is compliant with her medications.Ms Reising has attended regularly and has enjoyed the education. She should be commended for her dedicated participation in [place].    Expected Outcomes Continue exercising and self management of her COPD through the knowledge she has gain in [place].      ITP Comments:     ITP Comments    Row Name 09/30/16 1603 10/24/16 1131         ITP Comments  Ms Stankiewicz plans to start [facility] on 10/06/16 and attend 2-3 days/week. She does have a co-pay and has [employer], so Ms Lundblad may switch to the [employer] after several weeks. Attended [employer] Education Class         Comments: Discharge ITP

## 2017-01-02 NOTE — Progress Notes (Signed)
Discharge Summary  Patient Details  Name: [name] [name] MRN: [id number] Date of Birth: 07/26/1927 Referring Provider:     Number of Visits: 36/36  Reason for Discharge:  Patient reached a stable level of exercise. Patient independent in their exercise.  Smoking History:  History  Smoking Status  . Former Smoker  . Quit date: 12/05/1986  Smokeless Tobacco  . Never Used    Diagnosis:  Chronic obstructive pulmonary disease, unspecified COPD type ([place])  ADL UCSD:     Pulmonary Assessment Scores    Row Name 09/30/16 1459 11/19/16 1532 12/22/16 1227     ADL UCSD   ADL Phase Entry Mid Exit   SOB Score total 62 55 29   Rest 0 1 0   Walk 4 2 2    Stairs 4 4 4    Bath 2 3 1    Dress 3 3 1    Shop 3 3 2       Initial Exercise Prescription:     Initial Exercise Prescription - 09/30/16 1300      Date of Initial Exercise RX and Referring Provider   Date 09/30/16     Treadmill   MPH 1.4   Minutes 15   METs 2.07     NuStep   Level 2   Minutes 15   METs 2     T5 Nustep   Level 1   Minutes 15   METs 2     Biostep-RELP   Level 2   Minutes 15   METs 2     Prescription Details   Frequency (times per week) 3   Duration Progress to 45 minutes of aerobic exercise without signs/symptoms of physical distress     Intensity   THRR 40-80% of Max Heartrate 89-118   Ratings of Perceived Exertion 11-13   Perceived Dyspnea 0-4     Progression   Progression Continue to progress workloads to maintain intensity without signs/symptoms of physical distress.     Resistance Training   Training Prescription Yes   Weight 2   Reps 10-15      Discharge Exercise Prescription (Final Exercise Prescription Changes):     Exercise Prescription Changes - 12/31/16 1400      Response to Exercise   Blood Pressure (Admit) 120/76   Blood Pressure (Exercise) 138/70   Blood Pressure (Exit) 124/70   Heart Rate (Admit) 77 bpm   Heart Rate (Exercise) 95 bpm   Heart Rate (Exit) 66  bpm   Oxygen Saturation (Admit) 97 %   Oxygen Saturation (Exercise) 97 %   Oxygen Saturation (Exit) 97 %   Rating of Perceived Exertion (Exercise) 13   Perceived Dyspnea (Exercise) 3   Symptoms none   Duration Continue with 45 min of aerobic exercise without signs/symptoms of physical distress.   Intensity THRR unchanged     Progression   Progression Continue to progress workloads to maintain intensity without signs/symptoms of physical distress.   Average METs 2.21     Resistance Training   Training Prescription Yes   Weight 3 lbs   Reps 10-15     Interval Training   Interval Training No     Treadmill   MPH 1.6   Grade 0   Minutes 15   METs 2.23     NuStep   Level 4   SPM 71   Minutes 15   METs 2.3     Biostep-RELP   Level 12   SPM 43   Minutes 15  METs 2     Home Exercise Plan   Plans to continue exercise at Home (comment)  walking and bands   Frequency Add 2 additional days to program exercise sessions.   Initial Home Exercises Provided 10/17/16      Functional Capacity:     6 Minute Walk    Row Name 09/30/16 1356 11/21/16 1226 12/22/16 1251     6 Minute Walk   Phase  - Mid Program Discharge   Distance 845 feet 965 feet 1125 feet   Distance % Change  - 14.2 %  120 ft 33 %   Walk Time 6 minutes 6 minutes 6 minutes   # of Rest Breaks 0 0 0   MPH 1.6 1.83 2.13   METS 2.23 2.4 2.61   RPE 13 17 15    Perceived Dyspnea  3 3 3    VO2 Peak 3.16 3.69 5.54   Symptoms No Yes (comment) No   Comments  - SOB  -   Resting HR 60 bpm 82 bpm 77 bpm   Resting BP 114/50 124/64 142/78   Max Ex. HR 95 bpm 102 bpm 100 bpm   Max Ex. BP 152/56 132/70 164/74   2 Minute Post BP  - 126/64  -     Interval HR   Baseline HR 60 82 77   1 Minute HR 94 95 86   2 Minute HR 93 95 78   3 Minute HR 95 94 100   4 Minute HR 95 95 97   5 Minute HR 95 95 97   6 Minute HR 95 102 96   2 Minute Post HR  - 93  -   Interval Heart Rate? Yes Yes Yes     Interval Oxygen    Interval Oxygen? Yes Yes Yes   Baseline Oxygen Saturation % 97 % 99 % 98 %   Baseline Liters of Oxygen  - 0 L  Room Air  -   1 Minute Oxygen Saturation % 95 % 94 % 96 %   1 Minute Liters of Oxygen  - 0 L  -   2 Minute Oxygen Saturation % 96 % 94 % 94 %   2 Minute Liters of Oxygen  - 0 L  -   3 Minute Oxygen Saturation % 96 % 94 % 96 %   3 Minute Liters of Oxygen  - 0 L  -   4 Minute Oxygen Saturation % 96 % 94 % 97 %   4 Minute Liters of Oxygen  - 0 L  -   5 Minute Oxygen Saturation % 96 % 94 % 96 %   5 Minute Liters of Oxygen  - 0 L  -   6 Minute Oxygen Saturation % 96 % 94 % 96 %   6 Minute Liters of Oxygen  - 0 L  -   2 Minute Post Oxygen Saturation %  - 96 %  -   2 Minute Post Liters of Oxygen  - 0 L  -      Psychological, QOL, Others - Outcomes: PHQ 2/9: Depression screen [facility] 2/9 12/22/2016 09/30/2016  Decreased Interest 0 1  Down, Depressed, Hopeless 0 0  PHQ - 2 Score 0 1  Altered sleeping 2 3  Tired, decreased energy 2 3  Change in appetite 0 1  Feeling bad or failure about yourself  0 0  Trouble concentrating 0 1  Moving slowly or fidgety/restless 0 0  Suicidal thoughts  0 0  PHQ-9 Score 4 9  Difficult doing work/chores Not difficult at all Very difficult    Quality of Life:     Quality of Life - 12/22/16 1227      Quality of Life Scores   Health/Function Pre 20.87 %   Health/Function Post 18.67 %   Health/Function % Change -10.54 %   Socioeconomic Pre 20.33 %   Socioeconomic Post 22.25 %   Socioeconomic % Change  9.44 %   Psych/Spiritual Pre 20.86 %   Psych/Spiritual Post 26.5 %   Psych/Spiritual % Change 27.04 %   Family Pre 21 %   Family Post 30 %   Family % Change 42.86 %   GLOBAL Pre 20.78 %   GLOBAL Post 22.47 %   GLOBAL % Change 8.13 %      Personal Goals: Goals established at orientation with interventions provided to work toward goal.     Personal Goals and Risk Factors at Admission - 09/30/16 1502      Core Components/Risk  Factors/Patient Goals on Admission   Sedentary Yes  Goal: increase walking; walks her dog in the back yard around the fence line.   Intervention Provide advice, education, support and counseling about physical activity/exercise needs.;Develop an individualized exercise prescription for aerobic and resistive training based on initial evaluation findings, risk stratification, comorbidities and participant's personal goals.   Expected Outcomes Achievement of increased cardiorespiratory fitness and enhanced flexibility, muscular endurance and strength shown through measurements of functional capacity and personal statement of participant.   Increase Strength and Stamina Yes   Intervention Provide advice, education, support and counseling about physical activity/exercise needs.;Develop an individualized exercise prescription for aerobic and resistive training based on initial evaluation findings, risk stratification, comorbidities and participant's personal goals.   Expected Outcomes Achievement of increased cardiorespiratory fitness and enhanced flexibility, muscular endurance and strength shown through measurements of functional capacity and personal statement of participant.   Improve shortness of breath with ADL's Yes   Intervention Provide education, individualized exercise plan and daily activity instruction to help decrease symptoms of SOB with activities of daily living.   Expected Outcomes Short Term: Achieves a reduction of symptoms when performing activities of daily living.   Develop more efficient breathing techniques such as purse lipped breathing and diaphragmatic breathing; and practicing self-pacing with activity Yes   Intervention Provide education, demonstration and support about specific breathing techniuqes utilized for more efficient breathing. Include techniques such as pursed lipped breathing, diaphragmatic breathing and self-pacing activity.   Expected Outcomes Short Term:  Participant will be able to demonstrate and use breathing techniques as needed throughout daily activities.   Increase knowledge of respiratory medications and ability to use respiratory devices properly  Yes  Symbicort, Spiriva, Proventil, Spacer given; Duoneb   Intervention Provide education and demonstration as needed of appropriate use of medications, inhalers, and oxygen therapy.   Expected Outcomes Short Term: Achieves understanding of medications use. Understands that oxygen is a medication prescribed by physician. Demonstrates appropriate use of inhaler and oxygen therapy.   Hypertension Yes   Intervention Provide education on lifestyle modifcations including regular physical activity/exercise, weight management, moderate sodium restriction and increased consumption of fresh fruit, vegetables, and low fat dairy, alcohol moderation, and smoking cessation.;Monitor prescription use compliance.   Expected Outcomes Short Term: Continued assessment and intervention until BP is < 140/29mm HG in hypertensive participants. < 130/6mm HG in hypertensive participants with diabetes, heart failure or chronic kidney disease.;Long Term: Maintenance of blood pressure at goal  levels.   Lipids Yes   Intervention Provide education and support for participant on nutrition & aerobic/resistive exercise along with prescribed medications to achieve LDL 70mg , HDL >40mg .   Expected Outcomes Short Term: Participant states understanding of desired cholesterol values and is compliant with medications prescribed. Participant is following exercise prescription and nutrition guidelines.;Long Term: Cholesterol controlled with medications as prescribed, with individualized exercise RX and with personalized nutrition plan. Value goals: LDL < 70mg , HDL > 40 mg.       Personal Goals Discharge:     Goals and Risk Factor Review    Row Name 10/21/16 0616 11/10/16 1507 12/10/16 1439 12/23/16 0651       Core Components/Risk  Factors/Patient Goals Review   Personal Goals Review Sedentary;Increase Strength and Stamina;Improve shortness of breath with ADL's;Develop more efficient breathing techniques such as purse lipped breathing and diaphragmatic breathing and practicing self-pacing with activity.;Increase knowledge of respiratory medications and ability to use respiratory devices properly.;Hypertension;Lipids Weight Management/Obesity;Sedentary;Increase Strength and Stamina;Improve shortness of breath with ADL's;Develop more efficient breathing techniques such as purse lipped breathing and diaphragmatic breathing and practicing self-pacing with activity.;Increase knowledge of respiratory medications and ability to use respiratory devices properly.;Hypertension;Lipids Weight Management/Obesity;Improve shortness of breath with ADL's;Increase knowledge of respiratory medications and ability to use respiratory devices properly.;Develop more efficient breathing techniques such as purse lipped breathing and diaphragmatic breathing and practicing self-pacing with activity.;Hypertension;Lipids Weight Management/Obesity;Improve shortness of breath with ADL's;Increase knowledge of respiratory medications and ability to use respiratory devices properly.;Develop more efficient breathing techniques such as purse lipped breathing and diaphragmatic breathing and practicing self-pacing with activity.;Lipids;Hypertension    Review Ms Kawa is progressing with her exercise goals. She is now exercising 15 minutes on the BioS and NS. The treadmill is difficult for her, so she in doing set work and plans to increase her set times to 4 mins her next session. She uses PLB without cueing and manages her activities at home with pacing. A big concern for her is the cost of her inhalers. I have talked with her about switching to her nebulizer which Medicare Part D helps pay for these drugs. Ms Waid has been using the spacer I gave her for her Proventil.  She is now considering meeting with the dietitian. Her blood pressures are acceptable and she is compiant with her BP and Lipid drugs. Ms Phillis has increased her exercise goals and is now performing 15 minutes on the treadmill. Her physician appointment recently went well with a good report on her lipids and her blood pressure. Due to shaking from the Duoneb SVN, Mr Bevilacqua requested her medication changed to Xopenex, as we discussed in [place]. Ms Mccartin is enjoying exercise and is planning to exercise at her son's apartment gym. on the days off from [place]. She still has increased shortness of breath, but she has noticed an improvement in house chores, such as vacuming. She has good technique with her PLB. Ms Sorci attends regularly and enjoys the education. Ms Gillooly has progressed with her exercise goals and has more energy at home for her housework. She is planning to continue exercise in [employer] and does have a school close to her house that has a nice walking track. Ms Koos has not lost weight, but I encouraged her to perform the extra walking at home to help with weight loseShe has a good understanding of her MDI's and other medication . Her blood pressure has been acceptable in [facility]. PLB and pacing are routine for managing her  shortness of breath.  Ms Sundquist is graduating soon. She has progressed with her exercise goals and improved her post 74mwd by 280ft - Minimal Importance Difference for COPD is 98.21ft. She also improved her shortness of breath on the UCSD questionnaire by 33points - Minimal Importance Difference is 5 points. There is a new school by her house that has a mile walking track around the building . Ms Shackett is looking forward to walking there twice a week and may join [employer] 2 days/week. She has a good understaning of her MDI's and uses her spacer. Her blood pressures have been acceptable in [facility], and she is compliant with her medications.Ms Yohe has  attended regularly and has enjoyed the education. She should be commended for her dedicated participation in [place].     Expected Outcomes Continue progressing with her exercise and continue working with her on her respiratory medications. Continue progressing with her exercise goals and add 1 to 2 extra days of exercise. Continue attending [facility] regularly and start walking at the local school's track. Continue exercising and self management of her COPD through the knowledge she has gain in [place].       Nutrition & Weight - Outcomes:     Pre Biometrics - 09/30/16 1356      Pre Biometrics   Height 5' 2.5" (1.588 m)   Weight 184 lb 6.4 oz (83.6 kg)   Waist Circumference 38.5 inches   Hip Circumference 48.25 inches   Waist to Hip Ratio 0.8 %   BMI (Calculated) 33.3         Post Biometrics - 12/22/16 1250       Post  Biometrics   Height 5' 2.5" (1.588 m)   Weight 179 lb (81.2 kg)   Waist Circumference 36 inches   Hip Circumference 48 inches   Waist to Hip Ratio 0.75 %   BMI (Calculated) 32.3      Nutrition:   Nutrition Discharge:   Education Questionnaire Score:     Knowledge Questionnaire Score - 12/22/16 1228      Knowledge Questionnaire Score   Pre Score 5/10   Post Score 10/10      Goals reviewed with patient; copy given to patient.

## 2017-01-02 NOTE — Progress Notes (Signed)
Daily Session Note  Patient Details  Name: [name] [name] MRN: [id number] Date of Birth: 1926-10-05 Referring Provider:    Encounter Date: 01/02/2017  Check In:     Session Check In - 01/02/17 1224      Check-In   Location [facility]   Staff Present Nyoka Cowden, RN, BSN, Willette Pa, MA, ACSM RCEP, Exercise Physiologist   Supervising physician immediately available to respond to emergencies [facility] immediately available ER MD   Physician(s) Drs. McShane and Schaevitz   Medication changes reported     No   Fall or balance concerns reported    No   Warm-up and Cool-down Performed as group-led Location manager Performed Yes   VAD Patient? No     Pain Assessment   Currently in Pain? No/denies   Multiple Pain Sites No         History  Smoking Status  . Former Smoker  . Quit date: 12/05/1986  Smokeless Tobacco  . Never Used    Goals Met:  Proper associated with RPD/PD & O2 Sat Independence with exercise equipment Improved SOB with ADL's Using PLB without cueing & demonstrates good technique Exercise tolerated well Personal goals reviewed Strength training completed today  Goals Unmet:  Not Applicable  Comments:  Zema graduated today from cardiac rehab with 36/36 sessions completed.  Details of the patient's exercise prescription and what She needs to do in order to continue the prescription and progress were discussed with patient.  Patient was given a copy of prescription and goals.  Patient verbalized understanding.  Jensine plans to continue to exercise by coming into the [employer] program.    Dr. Emily Filbert is Medical Director for [place] and [facility].

## 2017-01-06 DIAGNOSIS — R0602 Shortness of breath: Secondary | ICD-10-CM | POA: Diagnosis not present

## 2017-01-06 DIAGNOSIS — I10 Essential (primary) hypertension: Secondary | ICD-10-CM | POA: Diagnosis not present

## 2017-01-06 DIAGNOSIS — E785 Hyperlipidemia, unspecified: Secondary | ICD-10-CM | POA: Diagnosis not present

## 2017-01-06 DIAGNOSIS — I480 Paroxysmal atrial fibrillation: Secondary | ICD-10-CM | POA: Diagnosis not present

## 2017-01-06 DIAGNOSIS — J45909 Unspecified asthma, uncomplicated: Secondary | ICD-10-CM | POA: Diagnosis not present

## 2017-01-06 DIAGNOSIS — Z950 Presence of cardiac pacemaker: Secondary | ICD-10-CM | POA: Diagnosis not present

## 2017-01-11 ENCOUNTER — Encounter (HOSPITAL_COMMUNITY): Payer: Self-pay

## 2017-01-11 ENCOUNTER — Emergency Department (HOSPITAL_COMMUNITY)
Admission: EM | Admit: 2017-01-11 | Discharge: 2017-01-12 | Disposition: A | Discharge status: Home / Self Care | Payer: PPO | Attending: Emergency Medicine | Admitting: Emergency Medicine

## 2017-01-11 DIAGNOSIS — Z7982 Long term (current) use of aspirin: Secondary | ICD-10-CM | POA: Diagnosis not present

## 2017-01-11 DIAGNOSIS — Z87891 Personal history of nicotine dependence: Secondary | ICD-10-CM | POA: Insufficient documentation

## 2017-01-11 DIAGNOSIS — I2510 Atherosclerotic heart disease of native coronary artery without angina pectoris: Secondary | ICD-10-CM | POA: Diagnosis not present

## 2017-01-11 DIAGNOSIS — S2341XA Sprain of ribs, initial encounter: Secondary | ICD-10-CM | POA: Diagnosis not present

## 2017-01-11 DIAGNOSIS — Z79899 Other long term (current) drug therapy: Secondary | ICD-10-CM | POA: Diagnosis not present

## 2017-01-11 DIAGNOSIS — I10 Essential (primary) hypertension: Secondary | ICD-10-CM | POA: Insufficient documentation

## 2017-01-11 DIAGNOSIS — R030 Elevated blood-pressure reading, without diagnosis of hypertension: Secondary | ICD-10-CM

## 2017-01-11 DIAGNOSIS — Z853 Personal history of malignant neoplasm of breast: Secondary | ICD-10-CM | POA: Diagnosis not present

## 2017-01-11 DIAGNOSIS — Z96659 Presence of unspecified artificial knee joint: Secondary | ICD-10-CM | POA: Insufficient documentation

## 2017-01-11 MED ORDER — ACETAMINOPHEN 500 MG PO TABS
1000.0000 mg | ORAL_TABLET | Freq: Once | ORAL | Status: AC
  Administered 2017-01-11: 1000 mg via ORAL
  Filled 2017-01-11: qty 2

## 2017-01-11 NOTE — ED Provider Notes (Signed)
[facility] DEPT Provider Note   CSN: [id number] Arrival date & time: 01/11/17  2121     History   Chief Complaint Chief Complaint  Patient presents with  . Hypertension    HPI [name] [name] is a 83 y.o. female.  Patient with PMH of HTN, CAD, breast cancer, seizure disorder presents to the presents to the ED with a chief complaint of hypertension.  She states that she has taken her BP numerous times today, with a high being 230/100.  She states that she recently took her evening BP meds, and now her BP is improving.  She states that she was seen at Urgent Care this morning for rib pain, which she attributes to bending over the deep freezer.  She states that she had imaging performed at Urgent Care, which was negative.  She states that she has not tried taking anything for her pain today.  She states that she developed a slight frontal headache in the afternoon, but denies any new numbness, weakness, tingling, slurred speech, or vision changes.  There are no other associated symptoms.  There are no other modifying factors.   The history is provided by the patient. No language interpreter was used.    Past Medical History:  Diagnosis Date  . Breast cancer ([place]) 1978   left  . CAD (coronary artery disease)   . DVT (deep venous thrombosis) ([place])   . Hemorrhoids   . History of knee replacement, total   . Hypertension   . Ischemic cardiomyopathy   . Peripheral arterial disease ([place])   . Seizure disorder ([place])     There are no active problems to display for this patient.   Past Surgical History:  Procedure Laterality Date  . ABDOMINAL HYSTERECTOMY  1985  . HEMORRHOID SURGERY    . ILIAC ARTERY STENT    . INTRAOPERATIVE ARTERIOGRAM Right   . MASTECTOMY Left 1978  . REDUCTION MAMMAPLASTY Right 1985   impant placed then removed    OB History    No data available       Home Medications    Prior to Admission medications   Medication Sig Start Date End Date Taking?  Authorizing Provider  albuterol (PROVENTIL HFA;VENTOLIN HFA) 108 (90 BASE) MCG/ACT inhaler Inhale 2 puffs into the lungs every 6 (six) hours as needed for wheezing or shortness of breath.    Historical Provider, MD  Albuterol Sulfate (PROAIR HFA IN) Inhale into the lungs.    Historical Provider, MD  amLODipine (NORVASC) 2.5 MG tablet Take 2.5 mg by mouth daily.    Historical Provider, MD  aspirin 325 MG EC tablet Take 325 mg by mouth daily.    Historical Provider, MD  atorvastatin (LIPITOR) 10 MG tablet Take 10 mg by mouth daily.    Historical Provider, MD  carvedilol (COREG) 25 MG tablet Take 25 mg by mouth 2 (two) times daily with a meal.    Historical Provider, MD  cloNIDine (CATAPRES) 0.2 MG tablet Take 0.2 mg by mouth 2 (two) times daily.    Historical Provider, MD  clopidogrel (PLAVIX) 75 MG tablet Take 75 mg by mouth daily.    Historical Provider, MD  ezetimibe (ZETIA) 10 MG tablet Take 10 mg by mouth daily.    Historical Provider, MD  furosemide (LASIX) 40 MG tablet Take 20 mg by mouth.     Historical Provider, MD  metoprolol succinate (TOPROL-XL) 100 MG 24 hr tablet Take 100 mg by mouth daily. Take with or immediately following  a meal.    Historical Provider, MD  montelukast (SINGULAIR) 10 MG tablet Take 10 mg by mouth at bedtime.    Historical Provider, MD  olmesartan-hydrochlorothiazide (BENICAR HCT) 40-25 MG per tablet Take 1 tablet by mouth daily.    Historical Provider, MD  omeprazole (PRILOSEC) 40 MG capsule Take 40 mg by mouth daily.    Historical Provider, MD  quinapril (ACCUPRIL) 40 MG tablet Take 40 mg by mouth daily.    Historical Provider, MD  sildenafil (VIAGRA) 100 MG tablet Take 100 mg by mouth daily as needed for erectile dysfunction.    Historical Provider, MD  Umeclidinium-Vilanterol 62.5-25 MCG/INH AEPB Inhale into the lungs.    Historical Provider, MD  vardenafil (LEVITRA) 20 MG tablet Take 20 mg by mouth daily as needed for erectile dysfunction.    Historical Provider,  MD    Family History Family History  Problem Relation Age of Onset  . Hypertension Father   . Heart disease Father   . Clotting disorder Father   . Cancer Mother     breast  . Heart disease Sister   . Thyroid disease Sister   . Diabetes Sister   . Breast cancer Daughter     Social History Social History  Substance Use Topics  . Smoking status: Former Smoker    Quit date: 12/05/1986  . Smokeless tobacco: Never Used  . Alcohol use No     Allergies   Penicillins and Nsaids   Review of Systems Review of Systems  All other systems reviewed and are negative.    Physical Exam Updated Vital Signs BP (!) 188/80 (BP Location: Right Arm)   Pulse 96   Temp 98.1 F (36.7 C) (Oral)   Resp 14   Ht 5' 2.25" (1.581 m)   Wt 81.2 kg   SpO2 97%   BMI 32.48 kg/m   Physical Exam  Constitutional: She is oriented to person, place, and time. She appears well-developed and well-nourished.  HENT:  Head: Normocephalic and atraumatic.  Right Ear: External ear normal.  Left Ear: External ear normal.  Eyes: Conjunctivae and EOM are normal. Pupils are equal, round, and reactive to light.  Neck: Normal range of motion. Neck supple.  No pain with neck flexion, no meningismus  Cardiovascular: Normal rate, regular rhythm and normal heart sounds.  Exam reveals no gallop and no friction rub.   No murmur heard. Pulmonary/Chest: Effort normal and breath sounds normal. No respiratory distress. She has no wheezes. She has no rales. She exhibits no tenderness.  Abdominal: Soft. Bowel sounds are normal. She exhibits no distension and no mass. There is no tenderness. There is no rebound and no guarding.  Musculoskeletal: Normal range of motion. She exhibits no edema or tenderness.  Normal gait.  Neurological: She is alert and oriented to person, place, and time. She has normal reflexes.  CN 3-12 intact, normal finger to nose, no pronator drift, sensation and strength intact bilaterally.  Skin:  Skin is warm and dry.  Psychiatric: She has a normal mood and affect. Her behavior is normal. Judgment and thought content normal.  Nursing note and vitals reviewed.    ED Treatments / Results  Labs (all labs ordered are listed, but only abnormal results are displayed) Labs Reviewed - No data to display  EKG  EKG Interpretation None       Radiology No results found.  Procedures Procedures (including critical care time)  Medications Ordered in ED Medications  acetaminophen (TYLENOL) tablet 1,000 mg (  not administered)     Initial Impression / Assessment and Plan / ED Course  I have reviewed the triage vital signs and the nursing notes.  Pertinent labs & imaging results that were available during my care of the patient were reviewed by me and considered in my medical decision making (see chart for details).     Patient with HTN, had BP that was running high today.  Took meds this evening and BP is now trending down.  She tells me that she thinks she got anxious when her BP was running high, which made it worse.  She may have some elevated BP due to her rib pain, for which she has not taken anything today, but did have a negative x-ray earlier for.  I will treat her with a Tylenol and monitor, but will plan for discharge with PCP follow-up.  Patient and family member understand and agree with the plan.  Patient seen by and discussed with Dr. Claudine Mouton, who recommends discharge with PCP follow-up.  Final Clinical Impressions(s) / ED Diagnoses   Final diagnoses:  Elevated blood pressure reading    New Prescriptions New Prescriptions   No medications on file     Montine Circle, PA-C 01/12/17 0006    Fredia Sorrow, MD 02/17/17 [phone]

## 2017-01-11 NOTE — ED Triage Notes (Signed)
Pt c/o high BP readings at home, daughter at bedside.

## 2017-01-12 NOTE — ED Notes (Signed)
Pt stable, ambulatory, states understanding of discharge instructions 

## 2017-01-12 NOTE — Discharge Instructions (Signed)
Please take your medications as prescribed.  Please return to the ED if you have worsening headache, numbness, weakness, tingling, slurred speech, or new vision changes.

## 2017-01-14 DIAGNOSIS — I480 Paroxysmal atrial fibrillation: Secondary | ICD-10-CM | POA: Diagnosis not present

## 2017-01-14 DIAGNOSIS — E7800 Pure hypercholesterolemia, unspecified: Secondary | ICD-10-CM | POA: Diagnosis not present

## 2017-01-14 DIAGNOSIS — I10 Essential (primary) hypertension: Secondary | ICD-10-CM | POA: Diagnosis not present

## 2017-01-14 DIAGNOSIS — Z950 Presence of cardiac pacemaker: Secondary | ICD-10-CM | POA: Diagnosis not present

## 2017-01-14 DIAGNOSIS — J410 Simple chronic bronchitis: Secondary | ICD-10-CM | POA: Diagnosis not present

## 2017-01-19 DIAGNOSIS — I480 Paroxysmal atrial fibrillation: Secondary | ICD-10-CM | POA: Diagnosis not present

## 2017-01-19 DIAGNOSIS — E785 Hyperlipidemia, unspecified: Secondary | ICD-10-CM | POA: Diagnosis not present

## 2017-01-19 DIAGNOSIS — I10 Essential (primary) hypertension: Secondary | ICD-10-CM | POA: Diagnosis not present

## 2017-01-19 DIAGNOSIS — J410 Simple chronic bronchitis: Secondary | ICD-10-CM | POA: Diagnosis not present

## 2017-01-27 DIAGNOSIS — I10 Essential (primary) hypertension: Secondary | ICD-10-CM | POA: Diagnosis not present

## 2017-01-27 DIAGNOSIS — J410 Simple chronic bronchitis: Secondary | ICD-10-CM | POA: Diagnosis not present

## 2017-01-27 DIAGNOSIS — I480 Paroxysmal atrial fibrillation: Secondary | ICD-10-CM | POA: Diagnosis not present

## 2017-02-10 DIAGNOSIS — H35371 Puckering of macula, right eye: Secondary | ICD-10-CM | POA: Diagnosis not present

## 2017-02-10 DIAGNOSIS — H52203 Unspecified astigmatism, bilateral: Secondary | ICD-10-CM | POA: Diagnosis not present

## 2017-02-10 DIAGNOSIS — Z961 Presence of intraocular lens: Secondary | ICD-10-CM | POA: Diagnosis not present

## 2017-02-24 DIAGNOSIS — I480 Paroxysmal atrial fibrillation: Secondary | ICD-10-CM | POA: Diagnosis not present

## 2017-03-03 DIAGNOSIS — Z0000 Encounter for general adult medical examination without abnormal findings: Secondary | ICD-10-CM | POA: Diagnosis not present

## 2017-03-03 DIAGNOSIS — N3941 Urge incontinence: Secondary | ICD-10-CM | POA: Diagnosis not present

## 2017-03-03 DIAGNOSIS — I482 Chronic atrial fibrillation: Secondary | ICD-10-CM | POA: Diagnosis not present

## 2017-03-03 DIAGNOSIS — I10 Essential (primary) hypertension: Secondary | ICD-10-CM | POA: Diagnosis not present

## 2017-03-03 DIAGNOSIS — Z950 Presence of cardiac pacemaker: Secondary | ICD-10-CM | POA: Diagnosis not present

## 2017-03-03 DIAGNOSIS — I480 Paroxysmal atrial fibrillation: Secondary | ICD-10-CM | POA: Diagnosis not present

## 2017-03-03 DIAGNOSIS — J410 Simple chronic bronchitis: Secondary | ICD-10-CM | POA: Diagnosis not present

## 2017-03-10 DIAGNOSIS — I10 Essential (primary) hypertension: Secondary | ICD-10-CM | POA: Diagnosis not present

## 2017-03-10 DIAGNOSIS — R0602 Shortness of breath: Secondary | ICD-10-CM | POA: Diagnosis not present

## 2017-03-10 DIAGNOSIS — J410 Simple chronic bronchitis: Secondary | ICD-10-CM | POA: Diagnosis not present

## 2017-03-10 DIAGNOSIS — I495 Sick sinus syndrome: Secondary | ICD-10-CM | POA: Diagnosis not present

## 2017-03-10 DIAGNOSIS — I480 Paroxysmal atrial fibrillation: Secondary | ICD-10-CM | POA: Diagnosis not present

## 2017-03-13 DIAGNOSIS — L821 Other seborrheic keratosis: Secondary | ICD-10-CM | POA: Diagnosis not present

## 2017-03-13 DIAGNOSIS — D485 Neoplasm of uncertain behavior of skin: Secondary | ICD-10-CM | POA: Diagnosis not present

## 2017-03-13 DIAGNOSIS — L720 Epidermal cyst: Secondary | ICD-10-CM | POA: Diagnosis not present

## 2017-03-13 DIAGNOSIS — L400 Psoriasis vulgaris: Secondary | ICD-10-CM | POA: Diagnosis not present

## 2017-03-16 DIAGNOSIS — I10 Essential (primary) hypertension: Secondary | ICD-10-CM | POA: Diagnosis not present

## 2017-03-16 DIAGNOSIS — R0602 Shortness of breath: Secondary | ICD-10-CM | POA: Diagnosis not present

## 2017-03-16 DIAGNOSIS — J449 Chronic obstructive pulmonary disease, unspecified: Secondary | ICD-10-CM | POA: Diagnosis not present

## 2017-03-16 DIAGNOSIS — I480 Paroxysmal atrial fibrillation: Secondary | ICD-10-CM | POA: Diagnosis not present

## 2017-03-16 DIAGNOSIS — R0789 Other chest pain: Secondary | ICD-10-CM | POA: Diagnosis not present

## 2017-03-16 DIAGNOSIS — Z950 Presence of cardiac pacemaker: Secondary | ICD-10-CM | POA: Diagnosis not present

## 2017-03-16 DIAGNOSIS — E785 Hyperlipidemia, unspecified: Secondary | ICD-10-CM | POA: Diagnosis not present

## 2017-03-24 DIAGNOSIS — I480 Paroxysmal atrial fibrillation: Secondary | ICD-10-CM | POA: Diagnosis not present

## 2017-04-15 DIAGNOSIS — I10 Essential (primary) hypertension: Secondary | ICD-10-CM | POA: Diagnosis not present

## 2017-04-15 DIAGNOSIS — R0789 Other chest pain: Secondary | ICD-10-CM | POA: Diagnosis not present

## 2017-04-15 DIAGNOSIS — I480 Paroxysmal atrial fibrillation: Secondary | ICD-10-CM | POA: Diagnosis not present

## 2017-04-15 DIAGNOSIS — R0602 Shortness of breath: Secondary | ICD-10-CM | POA: Diagnosis not present

## 2017-04-15 DIAGNOSIS — E785 Hyperlipidemia, unspecified: Secondary | ICD-10-CM | POA: Diagnosis not present

## 2017-04-17 DIAGNOSIS — M1712 Unilateral primary osteoarthritis, left knee: Secondary | ICD-10-CM | POA: Diagnosis not present

## 2017-04-17 DIAGNOSIS — M25561 Pain in right knee: Secondary | ICD-10-CM | POA: Diagnosis not present

## 2017-04-20 DIAGNOSIS — E785 Hyperlipidemia, unspecified: Secondary | ICD-10-CM | POA: Diagnosis not present

## 2017-04-20 DIAGNOSIS — J449 Chronic obstructive pulmonary disease, unspecified: Secondary | ICD-10-CM | POA: Diagnosis not present

## 2017-04-20 DIAGNOSIS — Z950 Presence of cardiac pacemaker: Secondary | ICD-10-CM | POA: Diagnosis not present

## 2017-04-20 DIAGNOSIS — I10 Essential (primary) hypertension: Secondary | ICD-10-CM | POA: Diagnosis not present

## 2017-04-20 DIAGNOSIS — I480 Paroxysmal atrial fibrillation: Secondary | ICD-10-CM | POA: Diagnosis not present

## 2017-05-13 DIAGNOSIS — M19012 Primary osteoarthritis, left shoulder: Secondary | ICD-10-CM | POA: Diagnosis not present

## 2017-05-13 DIAGNOSIS — I480 Paroxysmal atrial fibrillation: Secondary | ICD-10-CM | POA: Diagnosis not present

## 2017-05-26 DIAGNOSIS — M1712 Unilateral primary osteoarthritis, left knee: Secondary | ICD-10-CM | POA: Diagnosis not present

## 2017-06-04 DIAGNOSIS — M1712 Unilateral primary osteoarthritis, left knee: Secondary | ICD-10-CM | POA: Diagnosis not present

## 2017-06-10 DIAGNOSIS — I480 Paroxysmal atrial fibrillation: Secondary | ICD-10-CM | POA: Diagnosis not present

## 2017-06-11 DIAGNOSIS — M1712 Unilateral primary osteoarthritis, left knee: Secondary | ICD-10-CM | POA: Diagnosis not present

## 2017-06-18 DIAGNOSIS — R791 Abnormal coagulation profile: Secondary | ICD-10-CM | POA: Diagnosis not present

## 2017-06-19 DIAGNOSIS — B354 Tinea corporis: Secondary | ICD-10-CM | POA: Diagnosis not present

## 2017-06-19 DIAGNOSIS — B353 Tinea pedis: Secondary | ICD-10-CM | POA: Diagnosis not present

## 2017-06-30 DIAGNOSIS — I482 Chronic atrial fibrillation: Secondary | ICD-10-CM | POA: Diagnosis not present

## 2017-07-07 DIAGNOSIS — I10 Essential (primary) hypertension: Secondary | ICD-10-CM | POA: Diagnosis not present

## 2017-07-07 DIAGNOSIS — Z950 Presence of cardiac pacemaker: Secondary | ICD-10-CM | POA: Diagnosis not present

## 2017-07-07 DIAGNOSIS — R001 Bradycardia, unspecified: Secondary | ICD-10-CM | POA: Diagnosis not present

## 2017-07-07 DIAGNOSIS — E785 Hyperlipidemia, unspecified: Secondary | ICD-10-CM | POA: Diagnosis not present

## 2017-07-07 DIAGNOSIS — R0602 Shortness of breath: Secondary | ICD-10-CM | POA: Diagnosis not present

## 2017-07-07 DIAGNOSIS — J449 Chronic obstructive pulmonary disease, unspecified: Secondary | ICD-10-CM | POA: Diagnosis not present

## 2017-07-07 DIAGNOSIS — R791 Abnormal coagulation profile: Secondary | ICD-10-CM | POA: Diagnosis not present

## 2017-07-07 DIAGNOSIS — I480 Paroxysmal atrial fibrillation: Secondary | ICD-10-CM | POA: Diagnosis not present

## 2017-07-07 DIAGNOSIS — I495 Sick sinus syndrome: Secondary | ICD-10-CM | POA: Diagnosis not present

## 2017-07-27 ENCOUNTER — Other Ambulatory Visit: Payer: Self-pay | Admitting: Internal Medicine

## 2017-07-27 ENCOUNTER — Ambulatory Visit
Admission: RE | Admit: 2017-07-27 | Discharge: 2017-07-27 | Disposition: A | Discharge status: Home / Self Care | Payer: PPO | Source: Ambulatory Visit | Attending: Internal Medicine | Admitting: Internal Medicine

## 2017-07-27 DIAGNOSIS — J449 Chronic obstructive pulmonary disease, unspecified: Secondary | ICD-10-CM | POA: Diagnosis not present

## 2017-07-27 DIAGNOSIS — I10 Essential (primary) hypertension: Secondary | ICD-10-CM | POA: Diagnosis not present

## 2017-07-27 DIAGNOSIS — S0990XA Unspecified injury of head, initial encounter: Secondary | ICD-10-CM

## 2017-07-27 DIAGNOSIS — Z23 Encounter for immunization: Secondary | ICD-10-CM | POA: Diagnosis not present

## 2017-07-27 DIAGNOSIS — X58XXXA Exposure to other specified factors, initial encounter: Secondary | ICD-10-CM | POA: Diagnosis not present

## 2017-07-27 DIAGNOSIS — W108XXA Fall (on) (from) other stairs and steps, initial encounter: Secondary | ICD-10-CM | POA: Diagnosis not present

## 2017-07-27 DIAGNOSIS — I480 Paroxysmal atrial fibrillation: Secondary | ICD-10-CM | POA: Diagnosis not present

## 2017-07-27 DIAGNOSIS — Z9181 History of falling: Secondary | ICD-10-CM

## 2017-07-27 DIAGNOSIS — W109XXA Fall (on) (from) unspecified stairs and steps, initial encounter: Secondary | ICD-10-CM | POA: Diagnosis not present

## 2017-07-27 DIAGNOSIS — R42 Dizziness and giddiness: Secondary | ICD-10-CM | POA: Diagnosis not present

## 2017-07-31 ENCOUNTER — Encounter: Payer: Self-pay | Admitting: Emergency Medicine

## 2017-07-31 ENCOUNTER — Emergency Department
Admission: EM | Admit: 2017-07-31 | Discharge: 2017-07-31 | Disposition: A | Discharge status: Home / Self Care | Payer: PPO | Attending: Student in an Organized Health Care Education/Training Program | Admitting: Student in an Organized Health Care Education/Training Program

## 2017-07-31 ENCOUNTER — Emergency Department: Payer: PPO

## 2017-07-31 ENCOUNTER — Other Ambulatory Visit: Payer: Self-pay

## 2017-07-31 DIAGNOSIS — I2510 Atherosclerotic heart disease of native coronary artery without angina pectoris: Secondary | ICD-10-CM | POA: Diagnosis not present

## 2017-07-31 DIAGNOSIS — L03116 Cellulitis of left lower limb: Secondary | ICD-10-CM | POA: Insufficient documentation

## 2017-07-31 DIAGNOSIS — Z87891 Personal history of nicotine dependence: Secondary | ICD-10-CM | POA: Insufficient documentation

## 2017-07-31 DIAGNOSIS — Z955 Presence of coronary angioplasty implant and graft: Secondary | ICD-10-CM | POA: Diagnosis not present

## 2017-07-31 DIAGNOSIS — M7989 Other specified soft tissue disorders: Secondary | ICD-10-CM | POA: Diagnosis not present

## 2017-07-31 DIAGNOSIS — M79662 Pain in left lower leg: Secondary | ICD-10-CM | POA: Diagnosis not present

## 2017-07-31 DIAGNOSIS — Z7901 Long term (current) use of anticoagulants: Secondary | ICD-10-CM | POA: Insufficient documentation

## 2017-07-31 DIAGNOSIS — R791 Abnormal coagulation profile: Secondary | ICD-10-CM | POA: Diagnosis not present

## 2017-07-31 DIAGNOSIS — R600 Localized edema: Secondary | ICD-10-CM | POA: Diagnosis not present

## 2017-07-31 DIAGNOSIS — R2242 Localized swelling, mass and lump, left lower limb: Secondary | ICD-10-CM | POA: Diagnosis present

## 2017-07-31 DIAGNOSIS — Z853 Personal history of malignant neoplasm of breast: Secondary | ICD-10-CM | POA: Diagnosis not present

## 2017-07-31 DIAGNOSIS — S99922A Unspecified injury of left foot, initial encounter: Secondary | ICD-10-CM | POA: Diagnosis not present

## 2017-07-31 DIAGNOSIS — Z79899 Other long term (current) drug therapy: Secondary | ICD-10-CM | POA: Insufficient documentation

## 2017-07-31 DIAGNOSIS — Z96659 Presence of unspecified artificial knee joint: Secondary | ICD-10-CM | POA: Diagnosis not present

## 2017-07-31 DIAGNOSIS — S8992XA Unspecified injury of left lower leg, initial encounter: Secondary | ICD-10-CM | POA: Diagnosis not present

## 2017-07-31 LAB — CBC WITH DIFFERENTIAL/PLATELET
BASOS ABS: 0 10*3/uL (ref 0–0.1)
Basophils Relative: 1 %
EOS PCT: 2 %
Eosinophils Absolute: 0.1 10*3/uL (ref 0–0.7)
HEMATOCRIT: 40.3 % (ref 35.0–47.0)
Hemoglobin: 13.5 g/dL (ref 12.0–16.0)
LYMPHS PCT: 26 %
Lymphs Abs: 1.7 10*3/uL (ref 1.0–3.6)
MCH: 32.7 pg (ref 26.0–34.0)
MCHC: 33.5 g/dL (ref 32.0–36.0)
MCV: 97.6 fL (ref 80.0–100.0)
Monocytes Absolute: 0.5 10*3/uL (ref 0.2–0.9)
Monocytes Relative: 8 %
NEUTROS ABS: 4.1 10*3/uL (ref 1.4–6.5)
Neutrophils Relative %: 63 %
PLATELETS: 179 10*3/uL (ref 150–440)
RBC: 4.13 MIL/uL (ref 3.80–5.20)
RDW: 14.5 % (ref 11.5–14.5)
WBC: 6.5 10*3/uL (ref 3.6–11.0)

## 2017-07-31 LAB — BASIC METABOLIC PANEL
ANION GAP: 12 (ref 5–15)
BUN: 19 mg/dL (ref 6–20)
CO2: 27 mmol/L (ref 22–32)
Calcium: 9.4 mg/dL (ref 8.9–10.3)
Chloride: 102 mmol/L (ref 101–111)
Creatinine, Ser: 1.17 mg/dL — ABNORMAL HIGH (ref 0.44–1.00)
GFR, EST AFRICAN AMERICAN: 47 mL/min — AB (ref >60)
GFR, EST NON AFRICAN AMERICAN: 40 mL/min — AB (ref >60)
Glucose, Bld: 142 mg/dL — ABNORMAL HIGH (ref 65–99)
Potassium: 4 mmol/L (ref 3.5–5.1)
SODIUM: 141 mmol/L (ref 135–145)

## 2017-07-31 LAB — URINALYSIS, COMPLETE (UACMP) WITH MICROSCOPIC
BILIRUBIN URINE: NEGATIVE
Glucose, UA: NEGATIVE mg/dL
HGB URINE DIPSTICK: NEGATIVE
KETONES UR: NEGATIVE mg/dL
Nitrite: NEGATIVE
PH: 6 (ref 5.0–8.0)
Protein, ur: NEGATIVE mg/dL
Specific Gravity, Urine: 1.01 (ref 1.005–1.030)

## 2017-07-31 LAB — PROTIME-INR
INR: 3.93
Prothrombin Time: 38.8 seconds — ABNORMAL HIGH (ref 11.4–15.2)

## 2017-07-31 MED ORDER — MUPIROCIN 2 % EX OINT: TOPICAL_OINTMENT | CUTANEOUS | 0 refills | Status: AC

## 2017-07-31 MED ORDER — CLINDAMYCIN HCL 300 MG PO CAPS: 300.0000 mg | ORAL_CAPSULE | Freq: Three times a day (TID) | ORAL | 0 refills | Status: AC

## 2017-07-31 MED ORDER — CLINDAMYCIN HCL 150 MG PO CAPS
300.0000 mg | ORAL_CAPSULE | Freq: Once | ORAL | Status: AC
  Administered 2017-07-31: 300 mg via ORAL
  Filled 2017-07-31: qty 2

## 2017-07-31 NOTE — ED Provider Notes (Signed)
[facility] Emergency Department Provider Note    First MD Initiated Contact with Patient 07/31/17 2155     (approximate)  I have reviewed the triage vital signs and the nursing notes.   HISTORY  Chief Complaint Foot Pain    HPI MIAA LATTERELL is a 83 y.o. female resents with chief complaint of worsening swelling and pain to the left lower extremity.  Patient had a mechanical fall over the weekend.  Was seen in outpatient clinic after she hit her head and legs.  CT scan of the head was unrevealing.  She is on Coumadin for history of heart failure with pacer.  Denies any fevers nausea or vomiting.  States that today started noticing worsening pain warmth on the posterior aspect.  Has not been on any antibiotics.  Past Medical History:  Diagnosis Date  . Breast cancer ([place]) 1978   left  . CAD (coronary artery disease)   . DVT (deep venous thrombosis) ([place])   . Hemorrhoids   . History of knee replacement, total   . Hypertension   . Ischemic cardiomyopathy   . Peripheral arterial disease ([place])   . Seizure disorder ([place])    Family History  Problem Relation Age of Onset  . Hypertension Father   . Heart disease Father   . Clotting disorder Father   . Cancer Mother        breast  . Heart disease Sister   . Thyroid disease Sister   . Diabetes Sister   . Breast cancer Daughter    Past Surgical History:  Procedure Laterality Date  . ABDOMINAL HYSTERECTOMY  1985  . HEMORRHOID SURGERY    . ILIAC ARTERY STENT    . INTRAOPERATIVE ARTERIOGRAM Right   . MASTECTOMY Left 1978  . REDUCTION MAMMAPLASTY Right 1985   impant placed then removed   There are no active problems to display for this patient.     Prior to Admission medications   Medication Sig Start Date End Date Taking? Authorizing Provider  albuterol (PROVENTIL HFA;VENTOLIN HFA) 108 (90 BASE) MCG/ACT inhaler Inhale 2 puffs into the lungs every 6 (six) hours as needed for wheezing or  shortness of breath.    [provider]  amLODipine (NORVASC) 2.5 MG tablet Take 2.5 mg by mouth daily.    [provider]  atorvastatin (LIPITOR) 40 MG tablet Take 40 mg by mouth every evening.    [provider]  clindamycin (CLEOCIN) 300 MG capsule Take 1 capsule (300 mg total) 3 (three) times daily for 10 days by mouth. 07/31/17 08/10/17  Merlyn Lot, MD  furosemide (LASIX) 20 MG tablet Take 20 mg by mouth daily.    [provider]  hydrochlorothiazide (MICROZIDE) 12.5 MG capsule Take 12.5 mg by mouth daily.    [provider]  ipratropium-albuterol (DUONEB) 0.5-2.5 (3) MG/3ML SOLN Take 3 mLs by nebulization every 6 (six) hours as needed (for wheezing).    [provider]  metoprolol succinate (TOPROL-XL) 100 MG 24 hr tablet Take 100 mg by mouth daily. Take with or immediately following a meal.    [provider]  montelukast (SINGULAIR) 10 MG tablet Take 10 mg by mouth at bedtime.    [provider]  mupirocin ointment (BACTROBAN) 2 % Apply to affected area 3 times daily 07/31/17 07/31/18  Merlyn Lot, MD  omeprazole (PRILOSEC) 40 MG capsule Take 40 mg by mouth daily.    [provider]  oxybutynin (DITROPAN) 5 MG tablet  Take 5 mg by mouth 3 (three) times daily. 01/06/17   [provider]  quinapril (ACCUPRIL) 40 MG tablet Take 40 mg by mouth daily.    [provider]  tiotropium (SPIRIVA) 18 MCG inhalation capsule Place 18 mcg into inhaler and inhale daily.    [provider]  warfarin (COUMADIN) 5 MG tablet Take 5 mg by mouth daily.    [provider]    Allergies Penicillins and Nsaids    Social History Social History   Tobacco Use  . Smoking status: Former Smoker    Last attempt to quit: 12/05/1986    Years since quitting: 30.6  . Smokeless tobacco: Never Used  Substance Use Topics  . Alcohol use: No    Alcohol/week: 0.0 oz  . Drug use: No    Review  of Systems Patient denies headaches, rhinorrhea, blurry vision, numbness, shortness of breath, chest pain, edema, cough, abdominal pain, nausea, vomiting, diarrhea, dysuria, fevers, rashes or hallucinations unless otherwise stated above in HPI. ____________________________________________   PHYSICAL EXAM:  VITAL SIGNS: Vitals:   07/31/17 2143 07/31/17 2200  BP: 130/83 (!) 151/92  Pulse: 92 77  Resp: 18   Temp:    SpO2: 96% 98%    Constitutional: Alert and oriented. Well appearing and in no acute distress. Eyes: Conjunctivae are normal.  Head: Atraumatic. Nose: No congestion/rhinnorhea. Mouth/Throat: Mucous membranes are moist.   Neck: No stridor. Painless ROM.  Cardiovascular: Normal rate, regular rhythm. Grossly normal heart sounds.  Good peripheral circulation. Respiratory: Normal respiratory effort.  No retractions. Lungs CTAB. Gastrointestinal: Soft and nontender. No distention. No abdominal bruits. No CVA tenderness. Genitourinary:  Musculoskeletal: There is a small skin abrasion to the left posterior lower leg with surrounding erythema roughly 10 cm in diameter.  No fluctuance or purulent drainage at this time. Neurologic:  Normal speech and language. No gross focal neurologic deficits are appreciated. No facial droop Skin:  Skin is warm, dry and intact. No rash noted. Psychiatric: Mood and affect are normal. Speech and behavior are normal.  ____________________________________________   LABS (all labs ordered are listed, but only abnormal results are displayed)  Results for orders placed or performed during the hospital encounter of 07/31/17 (from the past 24 hour(s))  Protime-INR     Status: Abnormal   Collection Time: 07/31/17  7:43 PM  Result Value Ref Range   Prothrombin Time 38.8 (H) 11.4 - 15.2 seconds   INR 8.93   Basic metabolic panel     Status: Abnormal   Collection Time: 07/31/17  7:43 PM  Result Value Ref Range   Sodium 141 135 - 145 mmol/L    Potassium 4.0 3.5 - 5.1 mmol/L   Chloride 102 101 - 111 mmol/L   CO2 27 22 - 32 mmol/L   Glucose, Bld 142 (H) 65 - 99 mg/dL   BUN 19 6 - 20 mg/dL   Creatinine, Ser 1.17 (H) 0.44 - 1.00 mg/dL   Calcium 9.4 8.9 - 10.3 mg/dL   GFR calc non Af Amer 40 (L) >60 mL/min   GFR calc Af Amer 47 (L) >60 mL/min   Anion gap 12 5 - 15  CBC with Differential     Status: None   Collection Time: 07/31/17  7:43 PM  Result Value Ref Range   WBC 6.5 3.6 - 11.0 K/uL   RBC 4.13 3.80 - 5.20 MIL/uL   Hemoglobin 13.5 12.0 - 16.0 g/dL   HCT 40.3 35.0 - 47.0 %   MCV  97.6 80.0 - 100.0 fL   MCH 32.7 26.0 - 34.0 pg   MCHC 33.5 32.0 - 36.0 g/dL   RDW 14.5 11.5 - 14.5 %   Platelets 179 150 - 440 K/uL   Neutrophils Relative % 63 %   Neutro Abs 4.1 1.4 - 6.5 K/uL   Lymphocytes Relative 26 %   Lymphs Abs 1.7 1.0 - 3.6 K/uL   Monocytes Relative 8 %   Monocytes Absolute 0.5 0.2 - 0.9 K/uL   Eosinophils Relative 2 %   Eosinophils Absolute 0.1 0 - 0.7 K/uL   Basophils Relative 1 %   Basophils Absolute 0.0 0 - 0.1 K/uL  Urinalysis, Complete w Microscopic     Status: Abnormal   Collection Time: 07/31/17  7:53 PM  Result Value Ref Range   Color, Urine YELLOW (A) YELLOW   APPearance CLEAR (A) CLEAR   Specific Gravity, Urine 1.010 1.005 - 1.030   pH 6.0 5.0 - 8.0   Glucose, UA NEGATIVE NEGATIVE mg/dL   Hgb urine dipstick NEGATIVE NEGATIVE   Bilirubin Urine NEGATIVE NEGATIVE   Ketones, ur NEGATIVE NEGATIVE mg/dL   Protein, ur NEGATIVE NEGATIVE mg/dL   Nitrite NEGATIVE NEGATIVE   Leukocytes, UA TRACE (A) NEGATIVE   RBC / HPF 0-5 0 - 5 RBC/hpf   WBC, UA 6-30 0 - 5 WBC/hpf   Bacteria, UA RARE (A) NONE SEEN   Squamous Epithelial / LPF 0-5 (A) NONE SEEN   Hyaline Casts, UA PRESENT    ____________________________________________________________________________  RADIOLOGY  I personally reviewed all radiographic images ordered to evaluate for the above acute complaints and reviewed radiology reports and findings.   These findings were personally discussed with the patient.  Please see medical record for radiology report.  ____________________________________________   PROCEDURES  Procedure(s) performed:  Procedures    Critical Care performed: no ____________________________________________   INITIAL IMPRESSION / ASSESSMENT AND PLAN / ED COURSE  Pertinent labs & imaging results that were available during my care of the patient were reviewed by me and considered in my medical decision making (see chart for details).  DDX: cellulitis, dvt, abscess, nsti, contusion  ZOEANN MOL is a 83 y.o. who presents to the ED with swelling and evidence of cellulitis to left leg as described above.  X-ray showed no evidence of fracture or dislocation.  No foreign body.  Not clinically consistent with abscess.  Patient has a supratherapeutic INR therefore this is not consistent with DVT.  Patient will be started on oral antibiotics with topical antibiotics as well.  Has significant allergy to penicillins therefore will start on clindamycin.  Have discussed with the patient and available family all diagnostics and treatments performed thus far and all questions were answered to the best of my ability. The patient demonstrates understanding and agreement with plan.       ____________________________________________   FINAL CLINICAL IMPRESSION(S) / ED DIAGNOSES  Final diagnoses:  Cellulitis of left lower extremity  Supratherapeutic INR      NEW MEDICATIONS STARTED DURING THIS VISIT:  This SmartLink is deprecated. Use AVSMEDLIST instead to display the medication list for a patient.   Note:  This document was prepared using Dragon voice recognition software and may include unintentional dictation errors.    Merlyn Lot, MD 07/31/17 2219

## 2017-07-31 NOTE — ED Notes (Signed)
Pt states that last Friday night she was going up stairs and fell hitting her head. Left back of leg at bottom is very red and swollen also her right leg behind the knee is very bruised. Family at the bedside.

## 2017-07-31 NOTE — ED Triage Notes (Signed)
Pt reports falling x 1 week ago with left foot pain and hitting her head. Pt denies LOC and is on Warfarin. Pt has been seen by Dr. Ginette Pitman for the head and was cleared by him. Pt is here in ED due to redness and swelling on the left leg. A small puncture wound is visible. Pt is in NAD at this time.

## 2017-07-31 NOTE — Discharge Instructions (Signed)
Follow-up with Dr. Saralyn Pilar clinic regarding elevated INR.  Keep leg elevated.  Take antibiotics as directed.  Use topical antibiotic twice daily.  Return to the ER for worsening swelling pain or fevers.  Be sure to take yogurt or probiotics with these antibiotics.

## 2017-08-11 DIAGNOSIS — R791 Abnormal coagulation profile: Secondary | ICD-10-CM | POA: Diagnosis not present

## 2017-09-02 DIAGNOSIS — J410 Simple chronic bronchitis: Secondary | ICD-10-CM | POA: Diagnosis not present

## 2017-09-02 DIAGNOSIS — R0602 Shortness of breath: Secondary | ICD-10-CM | POA: Diagnosis not present

## 2017-09-02 DIAGNOSIS — I495 Sick sinus syndrome: Secondary | ICD-10-CM | POA: Diagnosis not present

## 2017-09-02 DIAGNOSIS — I480 Paroxysmal atrial fibrillation: Secondary | ICD-10-CM | POA: Diagnosis not present

## 2017-09-02 DIAGNOSIS — I10 Essential (primary) hypertension: Secondary | ICD-10-CM | POA: Diagnosis not present

## 2017-09-02 DIAGNOSIS — R791 Abnormal coagulation profile: Secondary | ICD-10-CM | POA: Diagnosis not present

## 2017-09-09 ENCOUNTER — Other Ambulatory Visit: Payer: Self-pay | Admitting: Internal Medicine

## 2017-09-09 ENCOUNTER — Ambulatory Visit
Admission: RE | Admit: 2017-09-09 | Discharge: 2017-09-09 | Disposition: A | Discharge status: Home / Self Care | Payer: PPO | Source: Ambulatory Visit | Attending: Internal Medicine | Admitting: Internal Medicine

## 2017-09-09 DIAGNOSIS — M7989 Other specified soft tissue disorders: Secondary | ICD-10-CM

## 2017-09-09 DIAGNOSIS — I480 Paroxysmal atrial fibrillation: Secondary | ICD-10-CM | POA: Diagnosis not present

## 2017-09-09 DIAGNOSIS — Z86718 Personal history of other venous thrombosis and embolism: Secondary | ICD-10-CM | POA: Diagnosis not present

## 2017-09-09 DIAGNOSIS — R600 Localized edema: Secondary | ICD-10-CM | POA: Diagnosis not present

## 2017-09-09 DIAGNOSIS — Z1231 Encounter for screening mammogram for malignant neoplasm of breast: Secondary | ICD-10-CM

## 2017-09-09 DIAGNOSIS — M7122 Synovial cyst of popliteal space [Baker], left knee: Secondary | ICD-10-CM | POA: Insufficient documentation

## 2017-09-09 DIAGNOSIS — Z0000 Encounter for general adult medical examination without abnormal findings: Secondary | ICD-10-CM | POA: Diagnosis not present

## 2017-09-09 DIAGNOSIS — I10 Essential (primary) hypertension: Secondary | ICD-10-CM | POA: Diagnosis not present

## 2017-09-09 DIAGNOSIS — J449 Chronic obstructive pulmonary disease, unspecified: Secondary | ICD-10-CM | POA: Diagnosis not present

## 2017-09-21 DIAGNOSIS — J40 Bronchitis, not specified as acute or chronic: Secondary | ICD-10-CM | POA: Diagnosis not present

## 2017-09-21 DIAGNOSIS — J441 Chronic obstructive pulmonary disease with (acute) exacerbation: Secondary | ICD-10-CM | POA: Diagnosis not present

## 2017-09-24 DIAGNOSIS — R791 Abnormal coagulation profile: Secondary | ICD-10-CM | POA: Diagnosis not present

## 2017-10-22 DIAGNOSIS — R791 Abnormal coagulation profile: Secondary | ICD-10-CM | POA: Diagnosis not present

## 2017-10-28 DIAGNOSIS — E785 Hyperlipidemia, unspecified: Secondary | ICD-10-CM | POA: Diagnosis not present

## 2017-10-28 DIAGNOSIS — J45909 Unspecified asthma, uncomplicated: Secondary | ICD-10-CM | POA: Diagnosis not present

## 2017-10-28 DIAGNOSIS — R001 Bradycardia, unspecified: Secondary | ICD-10-CM | POA: Diagnosis not present

## 2017-10-28 DIAGNOSIS — I10 Essential (primary) hypertension: Secondary | ICD-10-CM | POA: Diagnosis not present

## 2017-10-28 DIAGNOSIS — I480 Paroxysmal atrial fibrillation: Secondary | ICD-10-CM | POA: Diagnosis not present

## 2017-10-28 DIAGNOSIS — I495 Sick sinus syndrome: Secondary | ICD-10-CM | POA: Diagnosis not present

## 2017-10-28 DIAGNOSIS — J449 Chronic obstructive pulmonary disease, unspecified: Secondary | ICD-10-CM | POA: Diagnosis not present

## 2017-10-28 DIAGNOSIS — Z950 Presence of cardiac pacemaker: Secondary | ICD-10-CM | POA: Diagnosis not present

## 2017-10-28 DIAGNOSIS — R0602 Shortness of breath: Secondary | ICD-10-CM | POA: Diagnosis not present

## 2017-11-19 DIAGNOSIS — R791 Abnormal coagulation profile: Secondary | ICD-10-CM | POA: Diagnosis not present

## 2017-12-10 DIAGNOSIS — R791 Abnormal coagulation profile: Secondary | ICD-10-CM | POA: Diagnosis not present

## 2017-12-24 DIAGNOSIS — I480 Paroxysmal atrial fibrillation: Secondary | ICD-10-CM | POA: Diagnosis not present

## 2017-12-24 DIAGNOSIS — Z0000 Encounter for general adult medical examination without abnormal findings: Secondary | ICD-10-CM | POA: Diagnosis not present

## 2017-12-24 DIAGNOSIS — J449 Chronic obstructive pulmonary disease, unspecified: Secondary | ICD-10-CM | POA: Diagnosis not present

## 2017-12-24 DIAGNOSIS — R8290 Unspecified abnormal findings in urine: Secondary | ICD-10-CM | POA: Diagnosis not present

## 2017-12-24 DIAGNOSIS — R600 Localized edema: Secondary | ICD-10-CM | POA: Diagnosis not present

## 2017-12-24 DIAGNOSIS — I10 Essential (primary) hypertension: Secondary | ICD-10-CM | POA: Diagnosis not present

## 2017-12-29 DIAGNOSIS — I480 Paroxysmal atrial fibrillation: Secondary | ICD-10-CM | POA: Diagnosis not present

## 2017-12-31 ENCOUNTER — Other Ambulatory Visit: Payer: Self-pay | Admitting: Internal Medicine

## 2017-12-31 DIAGNOSIS — Z0000 Encounter for general adult medical examination without abnormal findings: Secondary | ICD-10-CM | POA: Diagnosis not present

## 2017-12-31 DIAGNOSIS — J449 Chronic obstructive pulmonary disease, unspecified: Secondary | ICD-10-CM | POA: Diagnosis not present

## 2017-12-31 DIAGNOSIS — I480 Paroxysmal atrial fibrillation: Secondary | ICD-10-CM | POA: Diagnosis not present

## 2017-12-31 DIAGNOSIS — I10 Essential (primary) hypertension: Secondary | ICD-10-CM | POA: Diagnosis not present

## 2017-12-31 DIAGNOSIS — R0602 Shortness of breath: Secondary | ICD-10-CM | POA: Diagnosis not present

## 2017-12-31 DIAGNOSIS — E876 Hypokalemia: Secondary | ICD-10-CM | POA: Diagnosis not present

## 2018-01-01 ENCOUNTER — Other Ambulatory Visit: Payer: Self-pay | Admitting: Internal Medicine

## 2018-01-01 DIAGNOSIS — J449 Chronic obstructive pulmonary disease, unspecified: Secondary | ICD-10-CM

## 2018-01-01 DIAGNOSIS — R0602 Shortness of breath: Secondary | ICD-10-CM

## 2018-01-07 DIAGNOSIS — R791 Abnormal coagulation profile: Secondary | ICD-10-CM | POA: Diagnosis not present

## 2018-01-07 DIAGNOSIS — E876 Hypokalemia: Secondary | ICD-10-CM | POA: Diagnosis not present

## 2018-01-08 ENCOUNTER — Ambulatory Visit: Admission: RE | Admit: 2018-01-08 | Payer: PPO | Source: Ambulatory Visit

## 2018-01-12 ENCOUNTER — Ambulatory Visit
Admission: RE | Admit: 2018-01-12 | Discharge: 2018-01-12 | Disposition: A | Discharge status: Home / Self Care | Payer: PPO | Source: Ambulatory Visit | Attending: Internal Medicine | Admitting: Internal Medicine

## 2018-01-12 DIAGNOSIS — Z950 Presence of cardiac pacemaker: Secondary | ICD-10-CM | POA: Diagnosis not present

## 2018-01-12 DIAGNOSIS — I700 Atherosclerosis of aorta: Secondary | ICD-10-CM | POA: Insufficient documentation

## 2018-01-12 DIAGNOSIS — I119 Hypertensive heart disease without heart failure: Secondary | ICD-10-CM | POA: Diagnosis not present

## 2018-01-12 DIAGNOSIS — R918 Other nonspecific abnormal finding of lung field: Secondary | ICD-10-CM | POA: Insufficient documentation

## 2018-01-12 DIAGNOSIS — R0609 Other forms of dyspnea: Secondary | ICD-10-CM | POA: Diagnosis not present

## 2018-01-12 DIAGNOSIS — R0602 Shortness of breath: Secondary | ICD-10-CM | POA: Diagnosis not present

## 2018-01-12 DIAGNOSIS — I2510 Atherosclerotic heart disease of native coronary artery without angina pectoris: Secondary | ICD-10-CM | POA: Insufficient documentation

## 2018-01-12 DIAGNOSIS — J449 Chronic obstructive pulmonary disease, unspecified: Secondary | ICD-10-CM

## 2018-01-12 DIAGNOSIS — Z5111 Encounter for antineoplastic chemotherapy: Secondary | ICD-10-CM | POA: Diagnosis not present

## 2018-01-12 MED ORDER — IOHEXOL 300 MG/ML  SOLN
75.0000 mL | Freq: Once | INTRAMUSCULAR | Status: AC | PRN
  Administered 2018-01-12: 75 mL via INTRAVENOUS

## 2018-02-04 DIAGNOSIS — R791 Abnormal coagulation profile: Secondary | ICD-10-CM | POA: Diagnosis not present

## 2018-02-26 DIAGNOSIS — I10 Essential (primary) hypertension: Secondary | ICD-10-CM | POA: Diagnosis not present

## 2018-02-26 DIAGNOSIS — R001 Bradycardia, unspecified: Secondary | ICD-10-CM | POA: Diagnosis not present

## 2018-02-26 DIAGNOSIS — J449 Chronic obstructive pulmonary disease, unspecified: Secondary | ICD-10-CM | POA: Diagnosis not present

## 2018-02-26 DIAGNOSIS — I480 Paroxysmal atrial fibrillation: Secondary | ICD-10-CM | POA: Diagnosis not present

## 2018-02-26 DIAGNOSIS — I495 Sick sinus syndrome: Secondary | ICD-10-CM | POA: Diagnosis not present

## 2018-02-26 DIAGNOSIS — Z950 Presence of cardiac pacemaker: Secondary | ICD-10-CM | POA: Diagnosis not present

## 2018-02-26 DIAGNOSIS — R0602 Shortness of breath: Secondary | ICD-10-CM | POA: Diagnosis not present

## 2018-03-04 DIAGNOSIS — R791 Abnormal coagulation profile: Secondary | ICD-10-CM | POA: Diagnosis not present

## 2018-03-08 ENCOUNTER — Other Ambulatory Visit: Payer: Self-pay | Admitting: Internal Medicine

## 2018-03-08 DIAGNOSIS — Z1231 Encounter for screening mammogram for malignant neoplasm of breast: Secondary | ICD-10-CM

## 2018-03-31 ENCOUNTER — Ambulatory Visit
Admission: RE | Admit: 2018-03-31 | Discharge: 2018-03-31 | Disposition: A | Discharge status: Home / Self Care | Payer: PPO | Source: Ambulatory Visit | Attending: Internal Medicine | Admitting: Internal Medicine

## 2018-03-31 DIAGNOSIS — Z1231 Encounter for screening mammogram for malignant neoplasm of breast: Secondary | ICD-10-CM | POA: Diagnosis not present

## 2018-04-01 DIAGNOSIS — R791 Abnormal coagulation profile: Secondary | ICD-10-CM | POA: Diagnosis not present

## 2018-04-27 DIAGNOSIS — I10 Essential (primary) hypertension: Secondary | ICD-10-CM | POA: Diagnosis not present

## 2018-04-27 DIAGNOSIS — J449 Chronic obstructive pulmonary disease, unspecified: Secondary | ICD-10-CM | POA: Diagnosis not present

## 2018-04-27 DIAGNOSIS — R791 Abnormal coagulation profile: Secondary | ICD-10-CM | POA: Diagnosis not present

## 2018-04-27 DIAGNOSIS — R0602 Shortness of breath: Secondary | ICD-10-CM | POA: Diagnosis not present

## 2018-04-27 DIAGNOSIS — I480 Paroxysmal atrial fibrillation: Secondary | ICD-10-CM | POA: Diagnosis not present

## 2018-04-30 DIAGNOSIS — I495 Sick sinus syndrome: Secondary | ICD-10-CM | POA: Diagnosis not present

## 2018-04-30 DIAGNOSIS — E785 Hyperlipidemia, unspecified: Secondary | ICD-10-CM | POA: Diagnosis not present

## 2018-04-30 DIAGNOSIS — I10 Essential (primary) hypertension: Secondary | ICD-10-CM | POA: Diagnosis not present

## 2018-04-30 DIAGNOSIS — R001 Bradycardia, unspecified: Secondary | ICD-10-CM | POA: Diagnosis not present

## 2018-04-30 DIAGNOSIS — R0602 Shortness of breath: Secondary | ICD-10-CM | POA: Diagnosis not present

## 2018-04-30 DIAGNOSIS — J449 Chronic obstructive pulmonary disease, unspecified: Secondary | ICD-10-CM | POA: Diagnosis not present

## 2018-04-30 DIAGNOSIS — I480 Paroxysmal atrial fibrillation: Secondary | ICD-10-CM | POA: Diagnosis not present

## 2018-04-30 DIAGNOSIS — Z950 Presence of cardiac pacemaker: Secondary | ICD-10-CM | POA: Diagnosis not present

## 2018-05-14 DIAGNOSIS — I480 Paroxysmal atrial fibrillation: Secondary | ICD-10-CM | POA: Diagnosis not present

## 2018-05-18 DIAGNOSIS — I495 Sick sinus syndrome: Secondary | ICD-10-CM | POA: Diagnosis not present

## 2018-05-18 DIAGNOSIS — I10 Essential (primary) hypertension: Secondary | ICD-10-CM | POA: Diagnosis not present

## 2018-05-18 DIAGNOSIS — J679 Hypersensitivity pneumonitis due to unspecified organic dust: Secondary | ICD-10-CM | POA: Insufficient documentation

## 2018-05-18 DIAGNOSIS — I38 Endocarditis, valve unspecified: Secondary | ICD-10-CM | POA: Diagnosis not present

## 2018-05-18 DIAGNOSIS — F411 Generalized anxiety disorder: Secondary | ICD-10-CM | POA: Diagnosis not present

## 2018-05-18 DIAGNOSIS — R0602 Shortness of breath: Secondary | ICD-10-CM | POA: Diagnosis not present

## 2018-05-18 DIAGNOSIS — Z950 Presence of cardiac pacemaker: Secondary | ICD-10-CM | POA: Diagnosis not present

## 2018-05-18 DIAGNOSIS — R001 Bradycardia, unspecified: Secondary | ICD-10-CM | POA: Diagnosis not present

## 2018-05-18 DIAGNOSIS — I480 Paroxysmal atrial fibrillation: Secondary | ICD-10-CM | POA: Diagnosis not present

## 2018-05-18 DIAGNOSIS — E785 Hyperlipidemia, unspecified: Secondary | ICD-10-CM | POA: Diagnosis not present

## 2018-05-18 DIAGNOSIS — J449 Chronic obstructive pulmonary disease, unspecified: Secondary | ICD-10-CM | POA: Diagnosis not present

## 2018-06-02 DIAGNOSIS — R791 Abnormal coagulation profile: Secondary | ICD-10-CM | POA: Diagnosis not present

## 2018-06-11 DIAGNOSIS — J679 Hypersensitivity pneumonitis due to unspecified organic dust: Secondary | ICD-10-CM | POA: Diagnosis not present

## 2018-06-11 DIAGNOSIS — R600 Localized edema: Secondary | ICD-10-CM | POA: Diagnosis not present

## 2018-06-11 DIAGNOSIS — S80822A Blister (nonthermal), left lower leg, initial encounter: Secondary | ICD-10-CM | POA: Diagnosis not present

## 2018-06-11 DIAGNOSIS — I38 Endocarditis, valve unspecified: Secondary | ICD-10-CM | POA: Diagnosis not present

## 2018-06-11 DIAGNOSIS — I480 Paroxysmal atrial fibrillation: Secondary | ICD-10-CM | POA: Diagnosis not present

## 2018-06-11 DIAGNOSIS — J441 Chronic obstructive pulmonary disease with (acute) exacerbation: Secondary | ICD-10-CM | POA: Diagnosis not present

## 2018-06-11 DIAGNOSIS — I10 Essential (primary) hypertension: Secondary | ICD-10-CM | POA: Diagnosis not present

## 2018-06-30 DIAGNOSIS — I480 Paroxysmal atrial fibrillation: Secondary | ICD-10-CM | POA: Diagnosis not present

## 2018-07-07 ENCOUNTER — Telehealth: Payer: Self-pay

## 2018-07-07 DIAGNOSIS — J449 Chronic obstructive pulmonary disease, unspecified: Secondary | ICD-10-CM

## 2018-07-07 NOTE — Telephone Encounter (Signed)
LM on VM for pt to call back, re: apt on Monday with Dr. Alva Garnet. He would like her to get a chest x-ray before apt. Orders entered.

## 2018-07-08 ENCOUNTER — Ambulatory Visit
Admission: RE | Admit: 2018-07-08 | Discharge: 2018-07-08 | Disposition: A | Discharge status: Home / Self Care | Payer: PPO | Source: Ambulatory Visit | Attending: Pulmonary Disease | Admitting: Pulmonary Disease

## 2018-07-08 ENCOUNTER — Telehealth: Payer: Self-pay | Admitting: Pulmonary Disease

## 2018-07-08 DIAGNOSIS — J449 Chronic obstructive pulmonary disease, unspecified: Secondary | ICD-10-CM | POA: Insufficient documentation

## 2018-07-08 NOTE — Telephone Encounter (Signed)
Patient states she will be out for a little while so it will be best to contact cell [phone]

## 2018-07-08 NOTE — Telephone Encounter (Signed)
Patient returning our call but she is asking If we call closer to 1130-12 pm   Please call back

## 2018-07-08 NOTE — Telephone Encounter (Signed)
Pt aware to have cxr.

## 2018-07-08 NOTE — Telephone Encounter (Signed)
X-ray spoke to Dr. Alva Garnet.

## 2018-07-08 NOTE — Telephone Encounter (Signed)
Please call regarding order for pt xray. Pt is there.

## 2018-07-12 ENCOUNTER — Other Ambulatory Visit: Payer: Self-pay | Admitting: Internal Medicine

## 2018-07-12 ENCOUNTER — Ambulatory Visit: Payer: PPO | Admitting: Pulmonary Disease

## 2018-07-12 ENCOUNTER — Other Ambulatory Visit
Admission: RE | Admit: 2018-07-12 | Discharge: 2018-07-12 | Disposition: A | Discharge status: Home / Self Care | Payer: PPO | Source: Ambulatory Visit | Attending: Pulmonary Disease | Admitting: Pulmonary Disease

## 2018-07-12 ENCOUNTER — Encounter: Payer: Self-pay | Admitting: Pulmonary Disease

## 2018-07-12 VITALS — BP 120/84 | HR 96 | Resp 16 | Ht 63.0 in | Wt 186.0 lb

## 2018-07-12 DIAGNOSIS — I482 Chronic atrial fibrillation, unspecified: Secondary | ICD-10-CM

## 2018-07-12 DIAGNOSIS — R0609 Other forms of dyspnea: Secondary | ICD-10-CM | POA: Diagnosis not present

## 2018-07-12 DIAGNOSIS — E668 Other obesity: Secondary | ICD-10-CM | POA: Diagnosis not present

## 2018-07-12 DIAGNOSIS — R918 Other nonspecific abnormal finding of lung field: Secondary | ICD-10-CM

## 2018-07-12 DIAGNOSIS — M810 Age-related osteoporosis without current pathological fracture: Secondary | ICD-10-CM | POA: Insufficient documentation

## 2018-07-12 DIAGNOSIS — I509 Heart failure, unspecified: Secondary | ICD-10-CM | POA: Diagnosis not present

## 2018-07-12 DIAGNOSIS — N3941 Urge incontinence: Secondary | ICD-10-CM | POA: Insufficient documentation

## 2018-07-12 DIAGNOSIS — K222 Esophageal obstruction: Secondary | ICD-10-CM | POA: Insufficient documentation

## 2018-07-12 LAB — CBC WITH DIFFERENTIAL/PLATELET
Abs Immature Granulocytes: 0.01 10*3/uL (ref 0.00–0.07)
BASOS ABS: 0 10*3/uL (ref 0.0–0.1)
BASOS PCT: 0 %
EOS ABS: 0.1 10*3/uL (ref 0.0–0.5)
Eosinophils Relative: 1 %
HCT: 41.5 % (ref 36.0–46.0)
Hemoglobin: 13 g/dL (ref 12.0–15.0)
IMMATURE GRANULOCYTES: 0 %
Lymphocytes Relative: 18 %
Lymphs Abs: 1 10*3/uL (ref 0.7–4.0)
MCH: 31.2 pg (ref 26.0–34.0)
MCHC: 31.3 g/dL (ref 30.0–36.0)
MCV: 99.5 fL (ref 80.0–100.0)
Monocytes Absolute: 0.5 10*3/uL (ref 0.1–1.0)
Monocytes Relative: 8 %
NEUTROS PCT: 73 %
NRBC: 0 % (ref 0.0–0.2)
Neutro Abs: 4.1 10*3/uL (ref 1.7–7.7)
PLATELETS: 172 10*3/uL (ref 150–400)
RBC: 4.17 MIL/uL (ref 3.87–5.11)
RDW: 14.2 % (ref 11.5–15.5)
WBC: 5.6 10*3/uL (ref 4.0–10.5)

## 2018-07-12 LAB — BRAIN NATRIURETIC PEPTIDE: B Natriuretic Peptide: 503 pg/mL — ABNORMAL HIGH (ref 0.0–100.0)

## 2018-07-12 LAB — SEDIMENTATION RATE: SED RATE: 9 mm/h (ref 0–30)

## 2018-07-12 NOTE — Progress Notes (Signed)
PULMONARY CONSULT NOTE  Requesting MD/Service: Hande Date of initial consultation: 07/12/18 Reason for consultation: Former smoker, dyspnea, abnormal CT chest  PT PROFILE: 84 y.o. female   DATA: CT chest 01/12/18: Very mild interstitial opacification in a mosaiform pattern which could be consistent with hypersensitivity pneumonitis, acute viral pneumonitis, mild interstitial edema or other forms of interstitial pneumonitis PFTs 01/12/18: FVC: 1.96 L (95 %pred), FEV1: 1.36 L (99 %pred), FEV1/FVC: 70%, TLC:  L (75 %pred), DLCO 64 %pred, DLCO/VA 132% predicted Echocardiogram 05/17/18: LVEF 50%, LA moderately enlarged, RV mildly enlarged, RA moderately enlarged, moderate-severe MR, moderate TR.  RVSP estimate 37 mmHg.  INTERVAL:  HPI:  [name] [name] is referred by Dr. Ginette Pitman for evaluation of the above.  She notes mildly progressive exertional dyspnea of approximately 10 years duration.  Her symptoms are most prominent when walking inclines.  She gets very winded walking down to her mailbox and back.  She believes the symptoms have been notably progressive over the past year.  Her shortness of breath is associated with chest tightness and palpitations.  There is little day-to-day variation.  She has very mild cough with occasional thick clear mucus.  She has been given a diagnosis of COPD (though her PFTs failed to demonstrate this) and has been treated with multiple inhaler medicines.  She was prescribed Spiriva approximately 3 to 4 years ago which, as well as she can recall, did improve her symptoms some.  Subsequently, Symbicort was added and she was unable to discern any further benefit.  If she misses doses of these inhalers, she does not notice any worsening of her symptoms.  She uses her albuterol inhaler almost every morning to "open her up".  She might use albuterol one other time during the day.  She does believe the albuterol is beneficial.  She has been on montelukast for many years, is unaware  of the indication for this medication and is unable to discern benefit from it.  She is followed by cardiology with coronary artery disease, chronic atrial fibrillation and valvular incompetency.  She is on chronic warfarin therapy.  Her records indicate a prior history of DVT but she is unaware of this diagnosis.  She is a former smoker of approximately 1 PPD x30 years, quit 1982  She has no significant occupational or environmental exposures.  She denies fever, purulent sputum, hemoptysis and calf tenderness.  She has chronic lower extremity edema.  Denies orthopnea and paroxysmal nocturnal dyspnea  Past Medical History:  Diagnosis Date  . Breast cancer ([place]) 1978   left  . CAD (coronary artery disease)   . DVT (deep venous thrombosis) ([place])   . Hemorrhoids   . History of knee replacement, total   . Hypertension   . Ischemic cardiomyopathy   . Peripheral arterial disease ([place])   . Seizure disorder [facility])     Past Surgical History:  Procedure Laterality Date  . ABDOMINAL HYSTERECTOMY  1985  . HEMORRHOID SURGERY    . ILIAC ARTERY STENT    . INTRAOPERATIVE ARTERIOGRAM Right   . MASTECTOMY Left 1978  . REDUCTION MAMMAPLASTY Right 1985   impant placed then removed    MEDICATIONS: I have reviewed all medications and confirmed regimen as documented  Social History   Socioeconomic History  . Marital status: Widowed    Spouse name: Not on file  . Number of children: Not on file  . Years of education: Not on file  . Highest education level: Not on file  Occupational History  .  Not on file  Social Needs  . Financial resource strain: Not on file  . Food insecurity:    Worry: Not on file    Inability: Not on file  . Transportation needs:    Medical: Not on file    Non-medical: Not on file  Tobacco Use  . Smoking status: Former Smoker    Types: Cigarettes    Last attempt to quit: 12/05/1986    Years since quitting: 31.6  . Smokeless tobacco: Never Used  Substance and  Sexual Activity  . Alcohol use: No    Alcohol/week: 0.0 standard drinks  . Drug use: No  . Sexual activity: Not on file  Lifestyle  . Physical activity:    Days per week: Not on file    Minutes per session: Not on file  . Stress: Not on file  Relationships  . Social connections:    Talks on phone: Not on file    Gets together: Not on file    Attends religious service: Not on file    Active member of club or organization: Not on file    Attends meetings of clubs or organizations: Not on file    Relationship status: Not on file  . Intimate partner violence:    Fear of current or ex partner: Not on file    Emotionally abused: Not on file    Physically abused: Not on file    Forced sexual activity: Not on file  Other Topics Concern  . Not on file  Social History Narrative  . Not on file    Family History  Problem Relation Age of Onset  . Hypertension Father   . Heart disease Father   . Clotting disorder Father   . Cancer Mother        breast  . Heart disease Sister   . Thyroid disease Sister   . Diabetes Sister   . Breast cancer Daughter        44's, twice diagnosed, double mastectomy  . Breast cancer Daughter        38's    ROS: Chronic lower extremity edema Denies orthopnea, paroxysmal nocturnal dyspnea No fever, myalgias/arthralgias, unexplained weight loss or weight gain No new focal weakness or sensory deficits No otalgia, hearing loss, visual changes, nasal and sinus symptoms, mouth and throat problems No neck pain or adenopathy No abdominal pain, N/V/D, diarrhea, change in bowel pattern No dysuria, change in urinary pattern   Vitals:   07/12/18 0843 07/12/18 0850  BP:  120/84  Pulse:  96  Resp:  16  SpO2:  97%  Weight: 186 lb (84.4 kg) 186 lb (84.4 kg)  Height: '5\' 3"'  (1.6 m)      EXAM:  Gen: Appears younger than her true age, No overt respiratory distress at rest, has difficulty stepping up to exam table HEENT: NCAT, sclera white, oropharynx  normal Neck: Supple without LAN, thyromegaly.  JVP difficult to discern but appears mildly elevated at 30 degrees Thorax: Moderate scoliosis Lungs: breath sounds full without wheezes or adventitious sounds, percussion note normal throughout Cardiovascular: IRIR, mild tachycardia, no murmurs noted Abdomen: Soft, nontender, normal BS Ext: No clubbing, cyanosis.  2-3+ symmetric pitting edema with chronic stasis changes Neuro: CNs grossly intact, motor and sensory intact Skin: Limited exam, no lesions noted  DATA:   BMP Latest Ref Rng & Units 07/31/2017 09/10/2015 03/21/2010  Glucose 65 - 99 mg/dL 142(H) 112(H) 108(H)  BUN 6 - 20 mg/dL 19 21(H) 10  Creatinine 0.44 -  1.00 mg/dL 1.17(H) 1.01(H) 0.69  Sodium 135 - 145 mmol/L 141 142 141  Potassium 3.5 - 5.1 mmol/L 4.0 4.1 3.8  Chloride 101 - 111 mmol/L 102 107 107  CO2 22 - 32 mmol/L '27 25 28  ' Calcium 8.9 - 10.3 mg/dL 9.4 9.3 9.2    CBC Latest Ref Rng & Units 07/12/2018 07/31/2017 09/10/2015  WBC 4.0 - 10.5 K/uL 5.6 6.5 8.2  Hemoglobin 12.0 - 15.0 g/dL 13.0 13.5 12.8  Hematocrit 36.0 - 46.0 % 41.5 40.3 39.6  Platelets 150 - 400 K/uL 172 179 183    CXR10/17/19: Cardiomegaly with mild cephalization and mild interstitial prominence  I have personally reviewed all chest radiographs reported above including CXRs and CT chest unless otherwise indicated  IMPRESSION:     ICD-10-CM   1. DOE (dyspnea on exertion) R06.09 Pulmonary Function Test [facility] Only    Brain natriuretic peptide    CANCELED: Brain natriuretic peptide  2. Chronic atrial fibrillation I48.20   3. Moderate obesity E66.8   4. Pulmonary infiltrates R91.8 Hypersensitivity pnuemonitis profile    CBC with Differential/Platelet    Sedimentation rate    CANCELED: Sedimentation rate  5. Suspect CHF on basis of arrhythmia and valvular dysfunction I50.9    Firstly, I emphasize that she does not have COPD as demonstrated by PFTs in April of this year.  However, she does appear to  benefit symptomatically from albuterol suggesting there might be a component of reactive airways disease.  The findings on CT scan in April are nonspecific.  I am reluctant to accept the radiologist's suggested diagnosis of hypersensitivity pneumonitis as her history does not fit this diagnosis.  Further, there are no potential exposures to explain this diagnosis  Her degree of exertional dyspnea exceeds findings on PFTs suggesting non-pulmonary cause(s).   Although her LV systolic function is normal, she does have atrial fibrillation and valvular incompetency which could be contributing to heart failure  Moderate obesity and a component of deconditioning are likely contributors to exertional dyspnea.  It is not clear to me how much of this is reversible.  At a minimum, I think we can get her off of some of these costly medications without any worsening of her symptoms.  Notably, it is unlikely that Singulair is offering her any benefit and there has been concerns in the past that Singulair can cause syndromes of pneumonitis.   PLAN:  Blood tests today: BNP, ESR, HSP panel, CBC with WBC differential  Discontinue Singulair (montelukast) and Symbicort inhaler  Continue Spiriva inhaler daily  Continue Pro-air (albuterol) inhaler as needed for shortness of breath, chest tightness, wheezing, cough  Follow-up in 2 to 4 weeks with repeat PFTs prior to that visit   Merton Border, MD PCCM service Mobile [phone] Pager [phone] 07/12/2018 12:48 PM

## 2018-07-12 NOTE — Patient Instructions (Addendum)
Blood tests today: BNP, ESR, HSP panel, CBC with WBC differential  Discontinue Singulair (montelukast)  Discontinue Symbicort inhaler  Continue Spiriva inhaler daily  Continue Pro-air (albuterol) inhaler as needed for shortness of breath, chest tightness, wheezing, cough  Follow-up in 2 to 4 weeks with PFTs prior to that visit

## 2018-07-16 LAB — HYPERSENSITIVITY PNEUMONITIS
A. PULLULANS ABS: NEGATIVE
A.Fumigatus #1 Abs: NEGATIVE
MICROPOLYSPORA FAENI IGG: NEGATIVE
PIGEON SERUM ABS: NEGATIVE
THERMOACT. SACCHARII: NEGATIVE
Thermoactinomyces vulgaris, IgG: NEGATIVE

## 2018-07-19 ENCOUNTER — Telehealth: Payer: Self-pay | Admitting: Pulmonary Disease

## 2018-07-19 NOTE — Telephone Encounter (Signed)
Pt is inquiring about her handicap application. Please call . Pt came into office to inquire about this, she is headed to sliver sneakers, and will stop by when she is done. About 2:30 pm

## 2018-07-19 NOTE — Telephone Encounter (Signed)
DS not back in clinic until 11/5.

## 2018-07-19 NOTE — Telephone Encounter (Signed)
Application placed in MD folder for completion Pt has appt 07/27/18.

## 2018-07-19 NOTE — Telephone Encounter (Signed)
Dr. Alva Garnet is not in the office today.

## 2018-07-22 ENCOUNTER — Ambulatory Visit: Payer: PPO | Attending: Pulmonary Disease

## 2018-07-22 DIAGNOSIS — R0609 Other forms of dyspnea: Secondary | ICD-10-CM | POA: Diagnosis not present

## 2018-07-22 MED ORDER — ALBUTEROL SULFATE (2.5 MG/3ML) 0.083% IN NEBU
2.5000 mg | INHALATION_SOLUTION | Freq: Once | RESPIRATORY_TRACT | Status: AC
  Administered 2018-07-22: 2.5 mg via RESPIRATORY_TRACT
  Filled 2018-07-22: qty 3

## 2018-07-27 ENCOUNTER — Ambulatory Visit
Admission: RE | Admit: 2018-07-27 | Discharge: 2018-07-27 | Disposition: A | Discharge status: Home / Self Care | Payer: PPO | Source: Ambulatory Visit | Attending: Pulmonary Disease | Admitting: Pulmonary Disease

## 2018-07-27 ENCOUNTER — Encounter: Payer: Self-pay | Admitting: Pulmonary Disease

## 2018-07-27 ENCOUNTER — Ambulatory Visit (INDEPENDENT_AMBULATORY_CARE_PROVIDER_SITE_OTHER): Payer: PPO | Admitting: Pulmonary Disease

## 2018-07-27 VITALS — BP 150/94 | HR 107 | Ht 63.0 in | Wt 187.6 lb

## 2018-07-27 DIAGNOSIS — I5032 Chronic diastolic (congestive) heart failure: Secondary | ICD-10-CM

## 2018-07-27 DIAGNOSIS — J984 Other disorders of lung: Secondary | ICD-10-CM | POA: Diagnosis not present

## 2018-07-27 DIAGNOSIS — R0609 Other forms of dyspnea: Secondary | ICD-10-CM | POA: Diagnosis not present

## 2018-07-27 DIAGNOSIS — J45909 Unspecified asthma, uncomplicated: Secondary | ICD-10-CM | POA: Diagnosis not present

## 2018-07-27 MED ORDER — BUDESONIDE-FORMOTEROL FUMARATE 160-4.5 MCG/ACT IN AERO: 2.0000 | INHALATION_SPRAY | Freq: Two times a day (BID) | RESPIRATORY_TRACT | 5 refills | Status: DC

## 2018-07-27 MED ORDER — FUROSEMIDE 20 MG PO TABS: 40.0000 mg | ORAL_TABLET | Freq: Two times a day (BID) | ORAL | 5 refills | Status: DC

## 2018-07-27 NOTE — Progress Notes (Signed)
PULMONARY OFFICE FOLLOW-UP NOTE  Requesting MD/Service: Hande Date of initial consultation: 07/12/18 Reason for consultation: Former smoker, dyspnea, abnormal CT chest  PT PROFILE: 84 y.o. female former remote smoker referred for evaluation of severe exertional dyspnea  DATA: CT chest 01/12/18: Very mild interstitial opacification in a mosaiform pattern which could be consistent with hypersensitivity pneumonitis, acute viral pneumonitis, mild interstitial edema or other forms of interstitial pneumonitis PFTs 01/12/18: FVC: 1.96 L (95 %pred), FEV1: 1.36 L (99 %pred), FEV1/FVC: 70%, TLC:  L (75 %pred), DLCO 64 %pred, DLCO/VA 132% predicted Echocardiogram 05/17/18: LVEF 50%, LA moderately enlarged, RV mildly enlarged, RA moderately enlarged, moderate-severe MR, moderate TR.  RVSP estimate 37 mmHg. PFTs 07/22/18: FVC: 1.30 > 1.27 L (58 > 56 %pred), FEV1: 1.04 > 1.08 L (62 > 64 %pred), FEV1/FVC: 80%, TLC: 2.88 L (58 %pred), DLCO 62 %pred    INTERVAL: No major pulmonary events. Furosemide dose was increased recently to 40 mg daily by Dr Ginette Pitman  SUBJ:  She returns today to review the results of pulmonary function testing and blood tests sent last visit.  These are discussed below.  She reports that her exertional dyspnea is worse.  She notes severe palpitations and racing heart with minimal exertion.  She denies CP, fever, purulent sputum, hemoptysis and calf tenderness.  She has chronic lower extremity edema which has not changed significantly.    Vitals:   07/27/18 1003 07/27/18 1006  BP:  (!) 150/94  Pulse:  (!) 107  SpO2:  96%  Weight: 187 lb 9.6 oz (85.1 kg)   Height: '5\' 3"'  (1.6 m)   Room air  EXAM:  Gen: No overt distress at rest HEENT: NCAT, sclera white, oropharynx normal Neck: Jugular venous distention noted at 45 degrees Thorax: Moderate scoliosis Lungs: Bibasilar crackles, few scattered wheezes Cardiovascular: Tachy, IRIR, no murmurs noted Abdomen: Soft, nontender, normal  BS Ext: 3+ symmetric pitting edema with chronic stasis changes Neuro: CNs grossly intact, motor and sensory intact Skin: Limited exam, no lesions noted  DATA:   BMP Latest Ref Rng & Units 07/31/2017 09/10/2015 03/21/2010  Glucose 65 - 99 mg/dL 142(H) 112(H) 108(H)  BUN 6 - 20 mg/dL 19 21(H) 10  Creatinine 0.44 - 1.00 mg/dL 1.17(H) 1.01(H) 0.69  Sodium 135 - 145 mmol/L 141 142 141  Potassium 3.5 - 5.1 mmol/L 4.0 4.1 3.8  Chloride 101 - 111 mmol/L 102 107 107  CO2 22 - 32 mmol/L '27 25 28  ' Calcium 8.9 - 10.3 mg/dL 9.4 9.3 9.2    CBC Latest Ref Rng & Units 07/12/2018 07/31/2017 09/10/2015  WBC 4.0 - 10.5 K/uL 5.6 6.5 8.2  Hemoglobin 12.0 - 15.0 g/dL 13.0 13.5 12.8  Hematocrit 36.0 - 46.0 % 41.5 40.3 39.6  Platelets 150 - 400 K/uL 172 179 183   BNP 503 ESR 9 mm/hr HSP panel negative WBC diff: eosinophil abs 100  CXR 07/27/18: Cardiomegaly with mild cephalization and mild interstitial prominence  I have personally reviewed all chest radiographs reported above including CXRs and CT chest unless otherwise indicated  IMPRESSION:     ICD-10-CM   1. Severe exertional dyspnea R06.09 DG Chest 2 View  2. Possible mild asthmatic bronchitis J45.909   3. Chronic heart failure with preserved ejection fraction (on basis of arrhythmia, valvular disease) I50.32 DG Chest 2 View    Basic Metabolic Panel (BMET)    CANCELED: Basic Metabolic Panel (BMET)   Her pulmonary function tests reveal moderate restriction which is likely multifactorial (obesity, kyphosis, heart failure).  They do not reveal definite evidence of obstruction.  However, she gets symptomatic improvement from albuterol and believes that she is more dyspneic after discontinuation of Symbicort.  Therefore, she might have a component of mild asthmatic bronchitis.  I continue to believe that CHF is the major cause of her exercise intolerance.  This is likely on the basis of atrial fibrillation and mitral regurgitation.  The elevated BNP  supports this contention.  Likewise as does her report of severe racing heart with minimal exertion.   PLAN:  Chest x-ray was ordered today and is reviewed above Resume Symbicort inhaler, 2 actuations twice a day.  Rinse mouth after use Continue Spiriva inhaler for now although I doubt that it is providing her much benefit Continue albuterol inhaler as needed I have instructed her to double her dose of furosemide to 40 mg twice a day  She is to take second dose in the early afternoon She will follow-up with me in 3 to 4 weeks.    With increased dose of Lasix, she will need BMET checked at that time  I encourage Dr. Ginette Pitman and Dr. Saralyn Pilar to consider further evaluation of heart failure.  This could include home cardiac monitoring to assess for what I suspect are severe bouts of tachycardia and consequent dyspnea.  Merton Border, MD PCCM service Mobile [phone] Pager [phone] 07/27/2018 3:13 PM

## 2018-07-27 NOTE — Patient Instructions (Signed)
Chest x-ray today Resume Symbicort inhaler as previously prescribed Continue Spiriva inhaler for now Continue albuterol inhaler as needed Double Lasix (furosemide) to 40 mg twice a day.  Take second dose in early afternoon Follow-up in 3 to 4 weeks with blood work Artist) prior to that visit

## 2018-07-28 DIAGNOSIS — R791 Abnormal coagulation profile: Secondary | ICD-10-CM | POA: Diagnosis not present

## 2018-08-02 ENCOUNTER — Telehealth: Payer: Self-pay | Admitting: Pulmonary Disease

## 2018-08-02 NOTE — Telephone Encounter (Signed)
Patient daughter says Dr. Alva Garnet started her on a fluid pill 07/27/18 and patient has not had any improvements.  Patient has calf pain now and symptoms of swelling have not improved .    Please call

## 2018-08-03 DIAGNOSIS — I10 Essential (primary) hypertension: Secondary | ICD-10-CM | POA: Diagnosis not present

## 2018-08-03 DIAGNOSIS — R0602 Shortness of breath: Secondary | ICD-10-CM | POA: Diagnosis not present

## 2018-08-03 DIAGNOSIS — R600 Localized edema: Secondary | ICD-10-CM | POA: Diagnosis not present

## 2018-08-03 DIAGNOSIS — I480 Paroxysmal atrial fibrillation: Secondary | ICD-10-CM | POA: Diagnosis not present

## 2018-08-03 NOTE — Telephone Encounter (Signed)
I have forwarded her concerns to Dr Ginette Pitman. Ask them to let us know if they do not get a response  Thanks  Waunita Schooner

## 2018-08-03 NOTE — Telephone Encounter (Signed)
Left message as directed in previous message from DS.

## 2018-08-10 ENCOUNTER — Other Ambulatory Visit
Admission: RE | Admit: 2018-08-10 | Discharge: 2018-08-10 | Disposition: A | Discharge status: Home / Self Care | Payer: PPO | Source: Ambulatory Visit | Attending: Pulmonary Disease | Admitting: Pulmonary Disease

## 2018-08-10 DIAGNOSIS — I5032 Chronic diastolic (congestive) heart failure: Secondary | ICD-10-CM | POA: Diagnosis not present

## 2018-08-10 LAB — BASIC METABOLIC PANEL
ANION GAP: 8 (ref 5–15)
BUN: 23 mg/dL (ref 8–23)
CHLORIDE: 108 mmol/L (ref 98–111)
CO2: 28 mmol/L (ref 22–32)
Calcium: 9.3 mg/dL (ref 8.9–10.3)
Creatinine, Ser: 0.95 mg/dL (ref 0.44–1.00)
GFR calc Af Amer: 60 mL/min — ABNORMAL LOW (ref >60)
GFR calc non Af Amer: 52 mL/min — ABNORMAL LOW (ref >60)
GLUCOSE: 99 mg/dL (ref 70–99)
POTASSIUM: 3.5 mmol/L (ref 3.5–5.1)
Sodium: 144 mmol/L (ref 135–145)

## 2018-08-11 DIAGNOSIS — R791 Abnormal coagulation profile: Secondary | ICD-10-CM | POA: Diagnosis not present

## 2018-08-11 DIAGNOSIS — R600 Localized edema: Secondary | ICD-10-CM | POA: Diagnosis not present

## 2018-08-11 DIAGNOSIS — I480 Paroxysmal atrial fibrillation: Secondary | ICD-10-CM | POA: Diagnosis not present

## 2018-08-11 DIAGNOSIS — I10 Essential (primary) hypertension: Secondary | ICD-10-CM | POA: Diagnosis not present

## 2018-08-16 ENCOUNTER — Ambulatory Visit (INDEPENDENT_AMBULATORY_CARE_PROVIDER_SITE_OTHER): Payer: PPO | Admitting: Pulmonary Disease

## 2018-08-16 ENCOUNTER — Encounter: Payer: Self-pay | Admitting: Pulmonary Disease

## 2018-08-16 VITALS — BP 128/70 | HR 99 | Resp 16 | Ht 63.0 in | Wt 183.0 lb

## 2018-08-16 DIAGNOSIS — R0609 Other forms of dyspnea: Secondary | ICD-10-CM

## 2018-08-16 DIAGNOSIS — J4530 Mild persistent asthma, uncomplicated: Secondary | ICD-10-CM | POA: Diagnosis not present

## 2018-08-16 DIAGNOSIS — I4891 Unspecified atrial fibrillation: Secondary | ICD-10-CM

## 2018-08-16 DIAGNOSIS — I509 Heart failure, unspecified: Secondary | ICD-10-CM | POA: Diagnosis not present

## 2018-08-16 MED ORDER — POTASSIUM CHLORIDE CRYS ER 10 MEQ PO TBCR: 10.0000 meq | EXTENDED_RELEASE_TABLET | Freq: Every day | ORAL | 1 refills | Status: DC

## 2018-08-16 NOTE — Patient Instructions (Signed)
Continue Symbicort and Spiriva inhalers Continue albuterol inhaler as needed Recommend doubling potassium (take a dose with every dose of Lasix) I will communicate with Dr. Saralyn Pilar and Dr. Ginette Pitman  Follow-up in 3 months.  Call sooner if needed

## 2018-08-17 NOTE — Progress Notes (Signed)
PULMONARY OFFICE FOLLOW-UP NOTE  Requesting MD/Service: Hande Date of initial consultation: 07/12/18 Reason for consultation: Former smoker, dyspnea, abnormal CT chest  PT PROFILE: 84 y.o. female former remote smoker referred for evaluation of severe exertional dyspnea  DATA: CT chest 01/12/18: Very mild interstitial opacification in a mosaiform pattern which could be consistent with hypersensitivity pneumonitis, acute viral pneumonitis, mild interstitial edema or other forms of interstitial pneumonitis PFTs 01/12/18: FVC: 1.96 L (95 %pred), FEV1: 1.36 L (99 %pred), FEV1/FVC: 70%, TLC:  L (75 %pred), DLCO 64 %pred, DLCO/VA 132% predicted Echocardiogram 05/17/18: LVEF 50%, LA moderately enlarged, RV mildly enlarged, RA moderately enlarged, moderate-severe MR, moderate TR.  RVSP estimate 37 mmHg. PFTs 07/22/18: FVC: 1.30 > 1.27 L (58 > 56 %pred), FEV1: 1.04 > 1.08 L (62 > 64 %pred), FEV1/FVC: 80%, TLC: 2.88 L (58 %pred), DLCO 62 %pred    INTERVAL: Last visit 07/27/2018.  No major pulmonary events since that time.  SUBJ:  This is a scheduled follow-up.  Last visit I doubled her furosemide to 40 mg twice a day.  Although her weight is down 4 pounds and her lower extremity edema is somewhat improved, she does not notice any improvement in her exertional dyspnea.  This remains moderate-severe (difficulty with ADLs) with little day-to-day or moment to moment variability.  She continues to note that her heart races with minimal exertion.  She remains on Symbicort and Spiriva.  She uses albuterol rescue inhaler 2-3 times per day and believes that it is modestly beneficial when she uses it.  She has no significant cough, chest pain, mucus production.  She denies hemoptysis.  She has mild orthopnea.  She continues to have ankle pedal and pretibial edema which is mildly improved since last visit.    Vitals:   08/16/18 1131 08/16/18 1132  BP:  128/70  Pulse:  99  Resp: 16   SpO2:  97%  Weight: 183 lb  (83 kg)   Height: 5\' 3"  (1.6 m)   Room air  EXAM:  Gen: No overt distress HEENT: NCAT, sclerae white Neck: Supple without adenopathy Thorax: Moderate scoliosis Lungs: No wheezes, diminished breath sounds, minimal bibasilar crackles Cardiovascular: Tachycardia, IR IR, no M noted Abdomen: Soft, NT, NABS Ext: 2-3+ symmetric pretibial, ankle, pedal edema.  Chronic venous stasis changes Neuro: No focal deficits Skin: Limited exam, no lesions noted  DATA:   BMP Latest Ref Rng & Units 08/10/2018 07/31/2017 09/10/2015  Glucose 70 - 99 mg/dL 99 142(H) 112(H)  BUN 8 - 23 mg/dL 23 19 21(H)  Creatinine 0.44 - 1.00 mg/dL 0.95 1.17(H) 1.01(H)  Sodium 135 - 145 mmol/L 144 141 142  Potassium 3.5 - 5.1 mmol/L 3.5 4.0 4.1  Chloride 98 - 111 mmol/L 108 102 107  CO2 22 - 32 mmol/L 28 27 25   Calcium 8.9 - 10.3 mg/dL 9.3 9.4 9.3    CBC Latest Ref Rng & Units 07/12/2018 07/31/2017 09/10/2015  WBC 4.0 - 10.5 K/uL 5.6 6.5 8.2  Hemoglobin 12.0 - 15.0 g/dL 13.0 13.5 12.8  Hematocrit 36.0 - 46.0 % 41.5 40.3 39.6  Platelets 150 - 400 K/uL 172 179 183     CXR: No new film  I have personally reviewed all chest radiographs reported above including CXRs and CT chest unless otherwise indicated  IMPRESSION:     ICD-10-CM   1. DOE (dyspnea on exertion) R06.09   2. Atrial fibrillation with rapid ventricular response (HCC) I48.91   3. Possible mild asthmatic bronchitis J45.30   4.  CHF on basis of rapid atrial fibrillation and valvular heart disease I50.9    We ambulated her to laps in the office today at a very slow pace.  She did not desaturate.  In fact her oxygen saturation remained above 96% the whole time.  However, her heart rate went as high as 138/min early during ambulation at this very slow pace.   PLAN:  -Continue Symbicort and Spiriva inhalers for now -Continue albuterol inhaler as needed -I recommended that she double her potassium to twice a day (taking a dose of potassium with each dose  of Lasix) -We discussed her severe, limiting DOE.  I again expressed my concern that it was more cardiac than pulmonary in nature.  Although her LVEF is preserved, she has atrial fibrillation with RVR upon minimal exertion and valvular heart disease (moderate MR).  We discussed possible options of therapy.  I suspect that she would benefit from more aggressive rate control.  I note that she has a pacemaker which would give [place] some "margin of error" when treating medically for rate control  Merton Border, MD PCCM service Mobile [phone] Pager [phone] 08/17/2018 12:29 PM

## 2018-09-17 DIAGNOSIS — I38 Endocarditis, valve unspecified: Secondary | ICD-10-CM | POA: Diagnosis not present

## 2018-09-17 DIAGNOSIS — J449 Chronic obstructive pulmonary disease, unspecified: Secondary | ICD-10-CM | POA: Diagnosis not present

## 2018-09-17 DIAGNOSIS — I10 Essential (primary) hypertension: Secondary | ICD-10-CM | POA: Diagnosis not present

## 2018-09-17 DIAGNOSIS — Z950 Presence of cardiac pacemaker: Secondary | ICD-10-CM | POA: Diagnosis not present

## 2018-09-17 DIAGNOSIS — J679 Hypersensitivity pneumonitis due to unspecified organic dust: Secondary | ICD-10-CM | POA: Diagnosis not present

## 2018-09-17 DIAGNOSIS — R0602 Shortness of breath: Secondary | ICD-10-CM | POA: Diagnosis not present

## 2018-09-17 DIAGNOSIS — I480 Paroxysmal atrial fibrillation: Secondary | ICD-10-CM | POA: Diagnosis not present

## 2018-09-17 DIAGNOSIS — R791 Abnormal coagulation profile: Secondary | ICD-10-CM | POA: Diagnosis not present

## 2018-09-17 DIAGNOSIS — F411 Generalized anxiety disorder: Secondary | ICD-10-CM | POA: Diagnosis not present

## 2018-09-20 DIAGNOSIS — E785 Hyperlipidemia, unspecified: Secondary | ICD-10-CM | POA: Diagnosis not present

## 2018-09-20 DIAGNOSIS — J449 Chronic obstructive pulmonary disease, unspecified: Secondary | ICD-10-CM | POA: Diagnosis not present

## 2018-09-20 DIAGNOSIS — I480 Paroxysmal atrial fibrillation: Secondary | ICD-10-CM | POA: Diagnosis not present

## 2018-09-20 DIAGNOSIS — R001 Bradycardia, unspecified: Secondary | ICD-10-CM | POA: Diagnosis not present

## 2018-09-20 DIAGNOSIS — I495 Sick sinus syndrome: Secondary | ICD-10-CM | POA: Diagnosis not present

## 2018-09-20 DIAGNOSIS — R0602 Shortness of breath: Secondary | ICD-10-CM | POA: Diagnosis not present

## 2018-09-20 DIAGNOSIS — I10 Essential (primary) hypertension: Secondary | ICD-10-CM | POA: Diagnosis not present

## 2018-09-20 DIAGNOSIS — J45909 Unspecified asthma, uncomplicated: Secondary | ICD-10-CM | POA: Diagnosis not present

## 2018-09-20 DIAGNOSIS — I38 Endocarditis, valve unspecified: Secondary | ICD-10-CM | POA: Diagnosis not present

## 2018-09-21 ENCOUNTER — Telehealth: Payer: Self-pay | Admitting: Pulmonary Disease

## 2018-09-21 DIAGNOSIS — I10 Essential (primary) hypertension: Secondary | ICD-10-CM | POA: Diagnosis not present

## 2018-09-21 DIAGNOSIS — R0609 Other forms of dyspnea: Secondary | ICD-10-CM | POA: Diagnosis not present

## 2018-09-21 DIAGNOSIS — I38 Endocarditis, valve unspecified: Secondary | ICD-10-CM | POA: Diagnosis not present

## 2018-09-21 DIAGNOSIS — Z950 Presence of cardiac pacemaker: Secondary | ICD-10-CM | POA: Diagnosis not present

## 2018-09-21 DIAGNOSIS — I480 Paroxysmal atrial fibrillation: Secondary | ICD-10-CM | POA: Diagnosis not present

## 2018-09-21 DIAGNOSIS — I495 Sick sinus syndrome: Secondary | ICD-10-CM | POA: Diagnosis not present

## 2018-09-21 DIAGNOSIS — R600 Localized edema: Secondary | ICD-10-CM | POA: Diagnosis not present

## 2018-09-21 NOTE — Telephone Encounter (Signed)
Returned call to patient's daughter and advised per Dr. Patsey Berthold that she needs to take patient to the emergency room. She verbalizes understanding and will let her mother know. Nothing further needed.

## 2018-10-18 DIAGNOSIS — I38 Endocarditis, valve unspecified: Secondary | ICD-10-CM | POA: Diagnosis not present

## 2018-10-18 DIAGNOSIS — I10 Essential (primary) hypertension: Secondary | ICD-10-CM | POA: Diagnosis not present

## 2018-10-18 DIAGNOSIS — I480 Paroxysmal atrial fibrillation: Secondary | ICD-10-CM | POA: Diagnosis not present

## 2018-10-18 DIAGNOSIS — Z950 Presence of cardiac pacemaker: Secondary | ICD-10-CM | POA: Diagnosis not present

## 2018-10-18 DIAGNOSIS — I495 Sick sinus syndrome: Secondary | ICD-10-CM | POA: Diagnosis not present

## 2018-10-18 DIAGNOSIS — R791 Abnormal coagulation profile: Secondary | ICD-10-CM | POA: Diagnosis not present

## 2018-10-18 DIAGNOSIS — R0609 Other forms of dyspnea: Secondary | ICD-10-CM | POA: Diagnosis not present

## 2018-10-18 DIAGNOSIS — R600 Localized edema: Secondary | ICD-10-CM | POA: Diagnosis not present

## 2018-10-20 DIAGNOSIS — R0609 Other forms of dyspnea: Secondary | ICD-10-CM | POA: Diagnosis not present

## 2018-10-20 DIAGNOSIS — J449 Chronic obstructive pulmonary disease, unspecified: Secondary | ICD-10-CM | POA: Diagnosis not present

## 2018-10-20 DIAGNOSIS — I480 Paroxysmal atrial fibrillation: Secondary | ICD-10-CM | POA: Diagnosis not present

## 2018-10-20 DIAGNOSIS — Z950 Presence of cardiac pacemaker: Secondary | ICD-10-CM | POA: Diagnosis not present

## 2018-10-20 DIAGNOSIS — R600 Localized edema: Secondary | ICD-10-CM | POA: Diagnosis not present

## 2018-10-20 DIAGNOSIS — J679 Hypersensitivity pneumonitis due to unspecified organic dust: Secondary | ICD-10-CM | POA: Diagnosis not present

## 2018-10-20 DIAGNOSIS — Z0000 Encounter for general adult medical examination without abnormal findings: Secondary | ICD-10-CM | POA: Diagnosis not present

## 2018-10-20 DIAGNOSIS — I5032 Chronic diastolic (congestive) heart failure: Secondary | ICD-10-CM | POA: Diagnosis not present

## 2018-10-20 DIAGNOSIS — I10 Essential (primary) hypertension: Secondary | ICD-10-CM | POA: Diagnosis not present

## 2018-11-15 DIAGNOSIS — R791 Abnormal coagulation profile: Secondary | ICD-10-CM | POA: Diagnosis not present

## 2018-11-18 ENCOUNTER — Ambulatory Visit: Payer: PPO | Admitting: Pulmonary Disease

## 2018-11-18 ENCOUNTER — Encounter: Payer: Self-pay | Admitting: Pulmonary Disease

## 2018-11-18 VITALS — BP 120/60 | HR 65 | Resp 16 | Ht 63.0 in | Wt 178.0 lb

## 2018-11-18 DIAGNOSIS — I509 Heart failure, unspecified: Secondary | ICD-10-CM | POA: Diagnosis not present

## 2018-11-18 DIAGNOSIS — J449 Chronic obstructive pulmonary disease, unspecified: Secondary | ICD-10-CM | POA: Diagnosis not present

## 2018-11-18 DIAGNOSIS — I482 Chronic atrial fibrillation, unspecified: Secondary | ICD-10-CM

## 2018-11-18 DIAGNOSIS — R0609 Other forms of dyspnea: Secondary | ICD-10-CM

## 2018-11-18 DIAGNOSIS — J4489 Other specified chronic obstructive pulmonary disease: Secondary | ICD-10-CM

## 2018-11-18 NOTE — Progress Notes (Signed)
PULMONARY OFFICE FOLLOW-UP NOTE  Requesting MD/Service: Hande Date of initial consultation: 07/12/18 Reason for consultation: Former smoker, dyspnea, abnormal CT chest  PT PROFILE: 85 y.o. female former remote smoker referred for evaluation of severe exertional dyspnea  DATA: CT chest 01/12/18: Very mild interstitial opacification in a mosaiform pattern which could be consistent with hypersensitivity pneumonitis, acute viral pneumonitis, mild interstitial edema or other forms of interstitial pneumonitis PFTs 01/12/18: FVC: 1.96 L (95 %pred), FEV1: 1.36 L (99 %pred), FEV1/FVC: 70%, TLC:  L (75 %pred), DLCO 64 %pred, DLCO/VA 132% predicted Echocardiogram 05/17/18: LVEF 50%, LA moderately enlarged, RV mildly enlarged, RA moderately enlarged, moderate-severe MR, moderate TR.  RVSP estimate 37 mmHg. PFTs 07/22/18: FVC: 1.30 > 1.27 L (58 > 56 %pred), FEV1: 1.04 > 1.08 L (62 > 64 %pred), FEV1/FVC: 80%, TLC: 2.88 L (58 %pred), DLCO 62 %pred    INTERVAL: Last visit 08/16/2018.  No major pulmonary events since that time.  She has had changes made in her cardiac regimen including reprogramming of pacemaker.    SUBJ:  This is a scheduled follow-up.  With increased diuresis, her weight is down 11 pounds from its peak.  With this, she notes significant improvement in exertional dyspnea.  There is little day-to-day or moment to moment variation in her exercise tolerance.  She remains on Symbicort and Spiriva.  She is concerned that the Symbicort causes palpitations and makes her lightheaded.  She is using albuterol every morning and rarely throughout the day.  She has chronic lower extremity edema which is improved.  She denies significant cough, pleuritic chest pain, mucus production.  She has had no hemoptysis.  She continues to have mild orthopnea.  She denies paroxysmal nocturnal dyspnea.    Vitals:   11/18/18 1001 11/18/18 1006  BP:  120/60  Pulse:  65  Resp: 16   SpO2:  100%  Weight: 178 lb (80.7  kg)   Height: 5\' 3"  (1.6 m)   Room air  EXAM:  Gen: NAD HEENT: NCAT, sclerae white Neck: No JVD noted Lungs: breath sounds mildly diminished with faint bibasilar crackles.  No wheezes Cardiovascular: IRIR, rate controlled, no murmurs noted Abdomen: Soft, nontender, normal BS Ext:1+ symmetric pretibial and ankle edema.  Chronic stasis changes.  No clubbing or cyanosis Neuro: No focal deficits noted Skin: Limited exam, no lesions noted   DATA:   BMP Latest Ref Rng & Units 08/10/2018 07/31/2017 09/10/2015  Glucose 70 - 99 mg/dL 99 142(H) 112(H)  BUN 8 - 23 mg/dL 23 19 21(H)  Creatinine 0.44 - 1.00 mg/dL 0.95 1.17(H) 1.01(H)  Sodium 135 - 145 mmol/L 144 141 142  Potassium 3.5 - 5.1 mmol/L 3.5 4.0 4.1  Chloride 98 - 111 mmol/L 108 102 107  CO2 22 - 32 mmol/L 28 27 25   Calcium 8.9 - 10.3 mg/dL 9.3 9.4 9.3    CBC Latest Ref Rng & Units 07/12/2018 07/31/2017 09/10/2015  WBC 4.0 - 10.5 K/uL 5.6 6.5 8.2  Hemoglobin 12.0 - 15.0 g/dL 13.0 13.5 12.8  Hematocrit 36.0 - 46.0 % 41.5 40.3 39.6  Platelets 150 - 400 K/uL 172 179 183    CXR: No new film  I have personally reviewed all chest radiographs reported above including CXRs and CT chest unless otherwise indicated  IMPRESSION:     ICD-10-CM   1. DOE, much improved with treatment for CHF R06.09   2. Chronic atrial fibrillation I48.20   3. CHF on basis of atrial fibrillation, valvular heart disease I50.9  4. Possible mild asthmatic bronchitis J44.9    Fairly dramatic improvement in exertional dyspnea with optimization of treatment for CHF and chronic atrial fibrillation.  It is unclear to me whether she has any lung disease or airways disease at all.  She is unable to discern whether inhaler medications provide her much benefit.   PLAN:  Trial of discontinuation of Symbicort inhaler Continue Spiriva inhaler daily Continue albuterol (ProAir air) inhaler as needed for increased wheezing, shortness of breath Follow-up in 4-6  months.  Call sooner if needed   Merton Border, MD PCCM service Mobile [phone] Pager [phone] 11/18/2018 12:17 PM

## 2018-11-18 NOTE — Patient Instructions (Signed)
Discontinue Symbicort inhaler Continue Spiriva inhaler daily Continue albuterol (ProAir air) inhaler as needed for increased wheezing, shortness of breath Follow-up in 4-6 months.  Call sooner if needed

## 2018-11-26 DIAGNOSIS — L400 Psoriasis vulgaris: Secondary | ICD-10-CM | POA: Diagnosis not present

## 2018-12-13 DIAGNOSIS — R791 Abnormal coagulation profile: Secondary | ICD-10-CM | POA: Diagnosis not present

## 2018-12-21 DIAGNOSIS — R0609 Other forms of dyspnea: Secondary | ICD-10-CM | POA: Diagnosis not present

## 2018-12-21 DIAGNOSIS — I5032 Chronic diastolic (congestive) heart failure: Secondary | ICD-10-CM | POA: Diagnosis not present

## 2018-12-21 DIAGNOSIS — R600 Localized edema: Secondary | ICD-10-CM | POA: Diagnosis not present

## 2018-12-21 DIAGNOSIS — I38 Endocarditis, valve unspecified: Secondary | ICD-10-CM | POA: Diagnosis not present

## 2018-12-21 DIAGNOSIS — J679 Hypersensitivity pneumonitis due to unspecified organic dust: Secondary | ICD-10-CM | POA: Diagnosis not present

## 2018-12-21 DIAGNOSIS — Z950 Presence of cardiac pacemaker: Secondary | ICD-10-CM | POA: Diagnosis not present

## 2018-12-21 DIAGNOSIS — I480 Paroxysmal atrial fibrillation: Secondary | ICD-10-CM | POA: Diagnosis not present

## 2018-12-21 DIAGNOSIS — R8290 Unspecified abnormal findings in urine: Secondary | ICD-10-CM | POA: Diagnosis not present

## 2018-12-21 DIAGNOSIS — R0602 Shortness of breath: Secondary | ICD-10-CM | POA: Diagnosis not present

## 2018-12-21 DIAGNOSIS — J449 Chronic obstructive pulmonary disease, unspecified: Secondary | ICD-10-CM | POA: Diagnosis not present

## 2018-12-21 DIAGNOSIS — I10 Essential (primary) hypertension: Secondary | ICD-10-CM | POA: Diagnosis not present

## 2018-12-21 DIAGNOSIS — E785 Hyperlipidemia, unspecified: Secondary | ICD-10-CM | POA: Diagnosis not present

## 2018-12-28 ENCOUNTER — Other Ambulatory Visit: Payer: Self-pay

## 2018-12-28 ENCOUNTER — Ambulatory Visit
Admission: RE | Admit: 2018-12-28 | Discharge: 2018-12-28 | Disposition: A | Discharge status: Home / Self Care | Payer: PPO | Source: Ambulatory Visit | Attending: Internal Medicine | Admitting: Internal Medicine

## 2018-12-28 ENCOUNTER — Other Ambulatory Visit (HOSPITAL_COMMUNITY): Payer: Self-pay | Admitting: Internal Medicine

## 2018-12-28 ENCOUNTER — Other Ambulatory Visit: Payer: Self-pay | Admitting: Internal Medicine

## 2018-12-28 DIAGNOSIS — H52223 Regular astigmatism, bilateral: Secondary | ICD-10-CM | POA: Diagnosis not present

## 2018-12-28 DIAGNOSIS — I10 Essential (primary) hypertension: Secondary | ICD-10-CM | POA: Diagnosis not present

## 2018-12-28 DIAGNOSIS — Z950 Presence of cardiac pacemaker: Secondary | ICD-10-CM | POA: Diagnosis not present

## 2018-12-28 DIAGNOSIS — I495 Sick sinus syndrome: Secondary | ICD-10-CM | POA: Diagnosis not present

## 2018-12-28 DIAGNOSIS — M7989 Other specified soft tissue disorders: Secondary | ICD-10-CM

## 2018-12-28 DIAGNOSIS — R0609 Other forms of dyspnea: Secondary | ICD-10-CM | POA: Insufficient documentation

## 2018-12-28 DIAGNOSIS — I480 Paroxysmal atrial fibrillation: Secondary | ICD-10-CM | POA: Diagnosis not present

## 2018-12-28 DIAGNOSIS — M7122 Synovial cyst of popliteal space [Baker], left knee: Secondary | ICD-10-CM | POA: Diagnosis not present

## 2018-12-28 DIAGNOSIS — J449 Chronic obstructive pulmonary disease, unspecified: Secondary | ICD-10-CM | POA: Diagnosis not present

## 2018-12-28 DIAGNOSIS — E785 Hyperlipidemia, unspecified: Secondary | ICD-10-CM | POA: Diagnosis not present

## 2018-12-28 DIAGNOSIS — H5203 Hypermetropia, bilateral: Secondary | ICD-10-CM | POA: Diagnosis not present

## 2018-12-28 DIAGNOSIS — J679 Hypersensitivity pneumonitis due to unspecified organic dust: Secondary | ICD-10-CM | POA: Diagnosis not present

## 2018-12-28 DIAGNOSIS — H524 Presbyopia: Secondary | ICD-10-CM | POA: Diagnosis not present

## 2018-12-28 DIAGNOSIS — I38 Endocarditis, valve unspecified: Secondary | ICD-10-CM | POA: Diagnosis not present

## 2018-12-28 DIAGNOSIS — H25813 Combined forms of age-related cataract, bilateral: Secondary | ICD-10-CM | POA: Diagnosis not present

## 2018-12-28 DIAGNOSIS — H1045 Other chronic allergic conjunctivitis: Secondary | ICD-10-CM | POA: Diagnosis not present

## 2019-01-11 DIAGNOSIS — R791 Abnormal coagulation profile: Secondary | ICD-10-CM | POA: Diagnosis not present

## 2019-01-18 DIAGNOSIS — R791 Abnormal coagulation profile: Secondary | ICD-10-CM | POA: Diagnosis not present

## 2019-02-08 DIAGNOSIS — R791 Abnormal coagulation profile: Secondary | ICD-10-CM | POA: Diagnosis not present

## 2019-02-08 DIAGNOSIS — R0602 Shortness of breath: Secondary | ICD-10-CM | POA: Diagnosis not present

## 2019-02-08 DIAGNOSIS — Z950 Presence of cardiac pacemaker: Secondary | ICD-10-CM | POA: Diagnosis not present

## 2019-02-08 DIAGNOSIS — I495 Sick sinus syndrome: Secondary | ICD-10-CM | POA: Diagnosis not present

## 2019-02-08 DIAGNOSIS — R51 Headache: Secondary | ICD-10-CM | POA: Diagnosis not present

## 2019-02-08 DIAGNOSIS — I10 Essential (primary) hypertension: Secondary | ICD-10-CM | POA: Diagnosis not present

## 2019-02-08 DIAGNOSIS — E785 Hyperlipidemia, unspecified: Secondary | ICD-10-CM | POA: Diagnosis not present

## 2019-02-08 DIAGNOSIS — J449 Chronic obstructive pulmonary disease, unspecified: Secondary | ICD-10-CM | POA: Diagnosis not present

## 2019-02-08 DIAGNOSIS — J679 Hypersensitivity pneumonitis due to unspecified organic dust: Secondary | ICD-10-CM | POA: Diagnosis not present

## 2019-02-24 DIAGNOSIS — J449 Chronic obstructive pulmonary disease, unspecified: Secondary | ICD-10-CM | POA: Diagnosis not present

## 2019-02-24 DIAGNOSIS — J679 Hypersensitivity pneumonitis due to unspecified organic dust: Secondary | ICD-10-CM | POA: Diagnosis not present

## 2019-02-24 DIAGNOSIS — R600 Localized edema: Secondary | ICD-10-CM | POA: Diagnosis not present

## 2019-02-24 DIAGNOSIS — R220 Localized swelling, mass and lump, head: Secondary | ICD-10-CM | POA: Diagnosis not present

## 2019-02-24 DIAGNOSIS — I480 Paroxysmal atrial fibrillation: Secondary | ICD-10-CM | POA: Diagnosis not present

## 2019-02-24 DIAGNOSIS — R0602 Shortness of breath: Secondary | ICD-10-CM | POA: Diagnosis not present

## 2019-02-24 DIAGNOSIS — I10 Essential (primary) hypertension: Secondary | ICD-10-CM | POA: Diagnosis not present

## 2019-02-24 DIAGNOSIS — I495 Sick sinus syndrome: Secondary | ICD-10-CM | POA: Diagnosis not present

## 2019-02-24 DIAGNOSIS — W57XXXA Bitten or stung by nonvenomous insect and other nonvenomous arthropods, initial encounter: Secondary | ICD-10-CM | POA: Diagnosis not present

## 2019-02-24 DIAGNOSIS — S70362A Insect bite (nonvenomous), left thigh, initial encounter: Secondary | ICD-10-CM | POA: Diagnosis not present

## 2019-03-01 DIAGNOSIS — R001 Bradycardia, unspecified: Secondary | ICD-10-CM | POA: Diagnosis not present

## 2019-03-08 DIAGNOSIS — R791 Abnormal coagulation profile: Secondary | ICD-10-CM | POA: Diagnosis not present

## 2019-04-07 DIAGNOSIS — I480 Paroxysmal atrial fibrillation: Secondary | ICD-10-CM | POA: Diagnosis not present

## 2019-04-25 DIAGNOSIS — I480 Paroxysmal atrial fibrillation: Secondary | ICD-10-CM | POA: Diagnosis not present

## 2019-04-25 DIAGNOSIS — E785 Hyperlipidemia, unspecified: Secondary | ICD-10-CM | POA: Diagnosis not present

## 2019-04-25 DIAGNOSIS — Z950 Presence of cardiac pacemaker: Secondary | ICD-10-CM | POA: Diagnosis not present

## 2019-04-25 DIAGNOSIS — R0602 Shortness of breath: Secondary | ICD-10-CM | POA: Diagnosis not present

## 2019-04-25 DIAGNOSIS — I10 Essential (primary) hypertension: Secondary | ICD-10-CM | POA: Diagnosis not present

## 2019-04-25 DIAGNOSIS — R001 Bradycardia, unspecified: Secondary | ICD-10-CM | POA: Diagnosis not present

## 2019-04-27 DIAGNOSIS — E785 Hyperlipidemia, unspecified: Secondary | ICD-10-CM | POA: Diagnosis not present

## 2019-04-27 DIAGNOSIS — I495 Sick sinus syndrome: Secondary | ICD-10-CM | POA: Diagnosis not present

## 2019-04-27 DIAGNOSIS — I480 Paroxysmal atrial fibrillation: Secondary | ICD-10-CM | POA: Diagnosis not present

## 2019-04-27 DIAGNOSIS — R8290 Unspecified abnormal findings in urine: Secondary | ICD-10-CM | POA: Diagnosis not present

## 2019-04-27 DIAGNOSIS — R0600 Dyspnea, unspecified: Secondary | ICD-10-CM | POA: Diagnosis not present

## 2019-04-27 DIAGNOSIS — I38 Endocarditis, valve unspecified: Secondary | ICD-10-CM | POA: Diagnosis not present

## 2019-04-27 DIAGNOSIS — M7989 Other specified soft tissue disorders: Secondary | ICD-10-CM | POA: Diagnosis not present

## 2019-04-27 DIAGNOSIS — Z950 Presence of cardiac pacemaker: Secondary | ICD-10-CM | POA: Diagnosis not present

## 2019-04-27 DIAGNOSIS — J679 Hypersensitivity pneumonitis due to unspecified organic dust: Secondary | ICD-10-CM | POA: Diagnosis not present

## 2019-04-27 DIAGNOSIS — I10 Essential (primary) hypertension: Secondary | ICD-10-CM | POA: Diagnosis not present

## 2019-04-27 DIAGNOSIS — J449 Chronic obstructive pulmonary disease, unspecified: Secondary | ICD-10-CM | POA: Diagnosis not present

## 2019-05-04 DIAGNOSIS — I878 Other specified disorders of veins: Secondary | ICD-10-CM | POA: Diagnosis not present

## 2019-05-04 DIAGNOSIS — J679 Hypersensitivity pneumonitis due to unspecified organic dust: Secondary | ICD-10-CM | POA: Diagnosis not present

## 2019-05-04 DIAGNOSIS — J449 Chronic obstructive pulmonary disease, unspecified: Secondary | ICD-10-CM | POA: Diagnosis not present

## 2019-05-04 DIAGNOSIS — I480 Paroxysmal atrial fibrillation: Secondary | ICD-10-CM | POA: Diagnosis not present

## 2019-05-04 DIAGNOSIS — Z0000 Encounter for general adult medical examination without abnormal findings: Secondary | ICD-10-CM | POA: Diagnosis not present

## 2019-05-04 DIAGNOSIS — R0602 Shortness of breath: Secondary | ICD-10-CM | POA: Diagnosis not present

## 2019-05-04 DIAGNOSIS — R0789 Other chest pain: Secondary | ICD-10-CM | POA: Diagnosis not present

## 2019-05-04 DIAGNOSIS — I10 Essential (primary) hypertension: Secondary | ICD-10-CM | POA: Diagnosis not present

## 2019-05-04 DIAGNOSIS — I495 Sick sinus syndrome: Secondary | ICD-10-CM | POA: Diagnosis not present

## 2019-05-05 DIAGNOSIS — R791 Abnormal coagulation profile: Secondary | ICD-10-CM | POA: Diagnosis not present

## 2019-05-12 DIAGNOSIS — J449 Chronic obstructive pulmonary disease, unspecified: Secondary | ICD-10-CM | POA: Diagnosis not present

## 2019-05-12 DIAGNOSIS — M1712 Unilateral primary osteoarthritis, left knee: Secondary | ICD-10-CM | POA: Diagnosis not present

## 2019-05-12 DIAGNOSIS — D485 Neoplasm of uncertain behavior of skin: Secondary | ICD-10-CM | POA: Diagnosis not present

## 2019-05-12 DIAGNOSIS — I480 Paroxysmal atrial fibrillation: Secondary | ICD-10-CM | POA: Diagnosis not present

## 2019-05-12 DIAGNOSIS — B078 Other viral warts: Secondary | ICD-10-CM | POA: Diagnosis not present

## 2019-05-12 DIAGNOSIS — I10 Essential (primary) hypertension: Secondary | ICD-10-CM | POA: Diagnosis not present

## 2019-05-12 DIAGNOSIS — I38 Endocarditis, valve unspecified: Secondary | ICD-10-CM | POA: Diagnosis not present

## 2019-05-12 DIAGNOSIS — L400 Psoriasis vulgaris: Secondary | ICD-10-CM | POA: Diagnosis not present

## 2019-05-12 DIAGNOSIS — L570 Actinic keratosis: Secondary | ICD-10-CM | POA: Diagnosis not present

## 2019-05-12 DIAGNOSIS — Z780 Asymptomatic menopausal state: Secondary | ICD-10-CM | POA: Diagnosis not present

## 2019-05-12 DIAGNOSIS — L538 Other specified erythematous conditions: Secondary | ICD-10-CM | POA: Diagnosis not present

## 2019-05-26 DIAGNOSIS — M810 Age-related osteoporosis without current pathological fracture: Secondary | ICD-10-CM | POA: Diagnosis not present

## 2019-05-27 DIAGNOSIS — M25512 Pain in left shoulder: Secondary | ICD-10-CM | POA: Diagnosis not present

## 2019-05-27 DIAGNOSIS — I10 Essential (primary) hypertension: Secondary | ICD-10-CM | POA: Diagnosis not present

## 2019-05-27 DIAGNOSIS — J449 Chronic obstructive pulmonary disease, unspecified: Secondary | ICD-10-CM | POA: Diagnosis not present

## 2019-05-27 DIAGNOSIS — G8929 Other chronic pain: Secondary | ICD-10-CM | POA: Diagnosis not present

## 2019-05-27 DIAGNOSIS — Z950 Presence of cardiac pacemaker: Secondary | ICD-10-CM | POA: Diagnosis not present

## 2019-05-27 DIAGNOSIS — R0602 Shortness of breath: Secondary | ICD-10-CM | POA: Diagnosis not present

## 2019-05-27 DIAGNOSIS — I495 Sick sinus syndrome: Secondary | ICD-10-CM | POA: Diagnosis not present

## 2019-06-02 DIAGNOSIS — I480 Paroxysmal atrial fibrillation: Secondary | ICD-10-CM | POA: Diagnosis not present

## 2019-06-14 DIAGNOSIS — L570 Actinic keratosis: Secondary | ICD-10-CM | POA: Diagnosis not present

## 2019-06-30 DIAGNOSIS — I480 Paroxysmal atrial fibrillation: Secondary | ICD-10-CM | POA: Diagnosis not present

## 2019-07-13 DIAGNOSIS — Z950 Presence of cardiac pacemaker: Secondary | ICD-10-CM | POA: Diagnosis not present

## 2019-07-13 DIAGNOSIS — M2669 Other specified disorders of temporomandibular joint: Secondary | ICD-10-CM | POA: Diagnosis not present

## 2019-07-13 DIAGNOSIS — J449 Chronic obstructive pulmonary disease, unspecified: Secondary | ICD-10-CM | POA: Diagnosis not present

## 2019-07-13 DIAGNOSIS — J679 Hypersensitivity pneumonitis due to unspecified organic dust: Secondary | ICD-10-CM | POA: Diagnosis not present

## 2019-07-13 DIAGNOSIS — E785 Hyperlipidemia, unspecified: Secondary | ICD-10-CM | POA: Diagnosis not present

## 2019-07-28 DIAGNOSIS — I480 Paroxysmal atrial fibrillation: Secondary | ICD-10-CM | POA: Diagnosis not present

## 2019-08-01 IMAGING — CR DG FOOT COMPLETE 3+V*L*
1 series · 3 of 3 positions shown · non-contrast
Comparison: None.

CLINICAL DATA: Trauma 1 week ago. Redness and swelling in the left
leg.

EXAM:
LEFT FOOT - COMPLETE 3+ VIEW

[Series 1: dg foot complete left · 0.14mm/px · 3 of 3 slices shown]
[im 1/3]
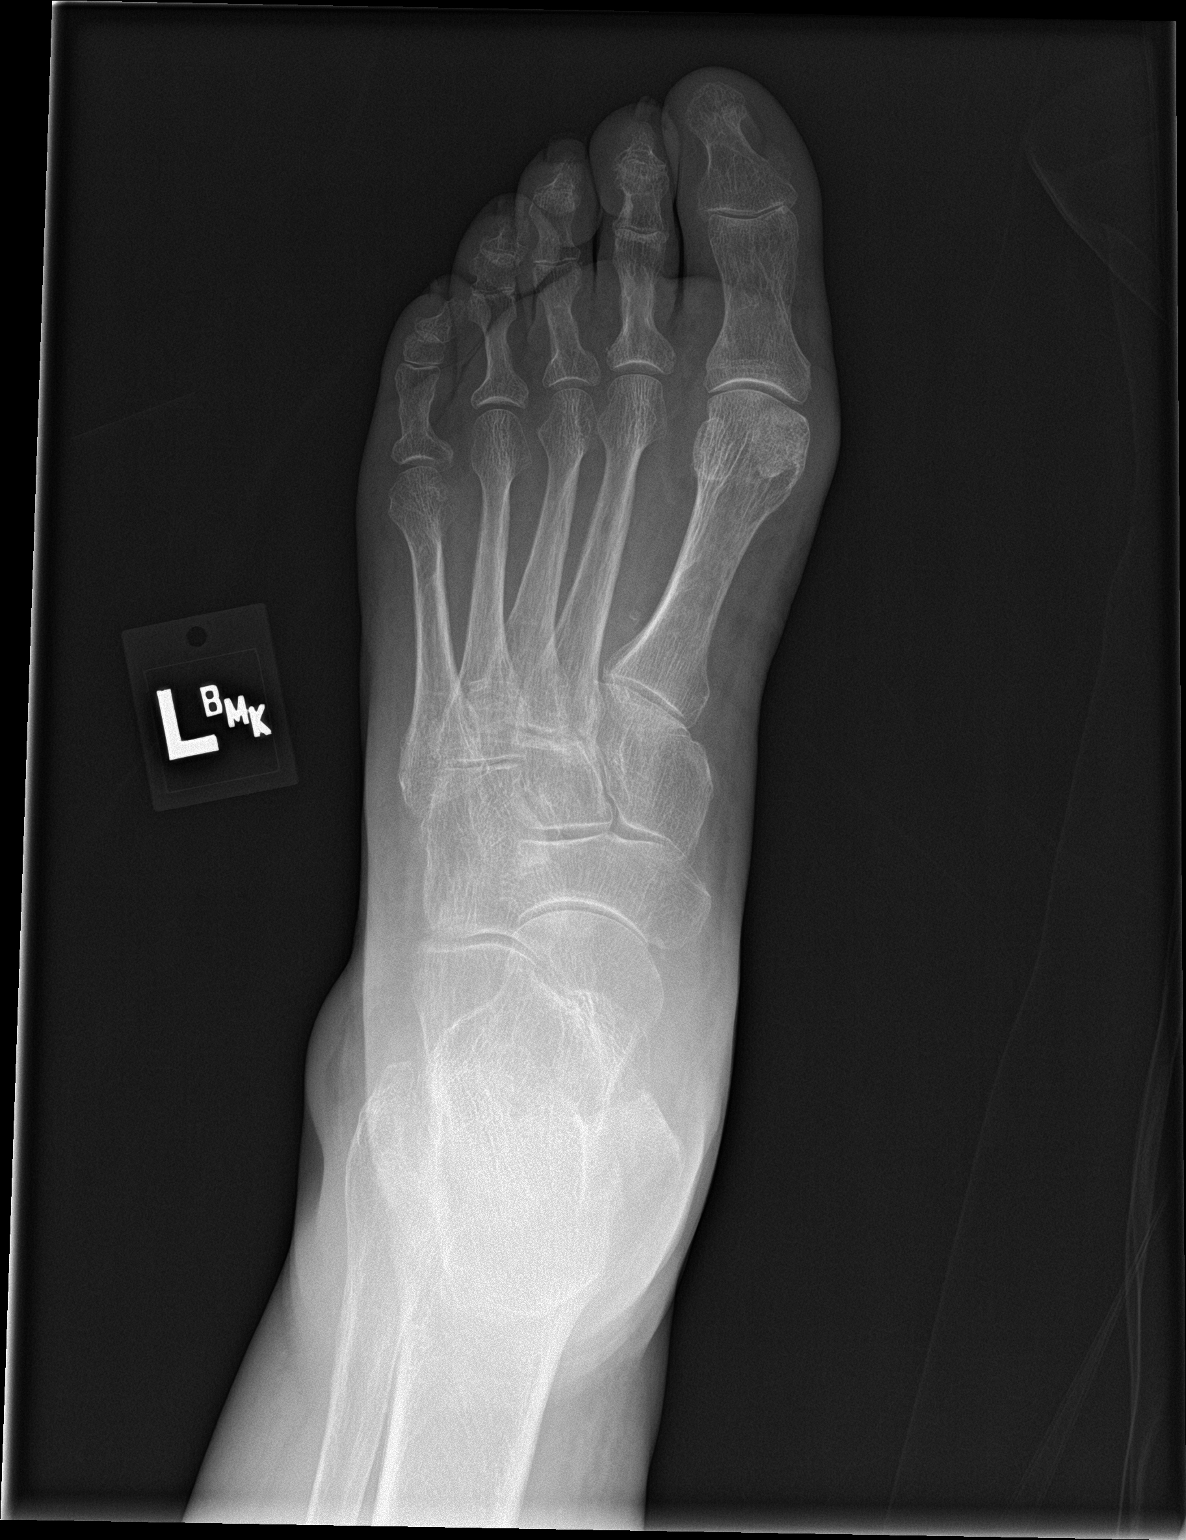
[im 2/3]
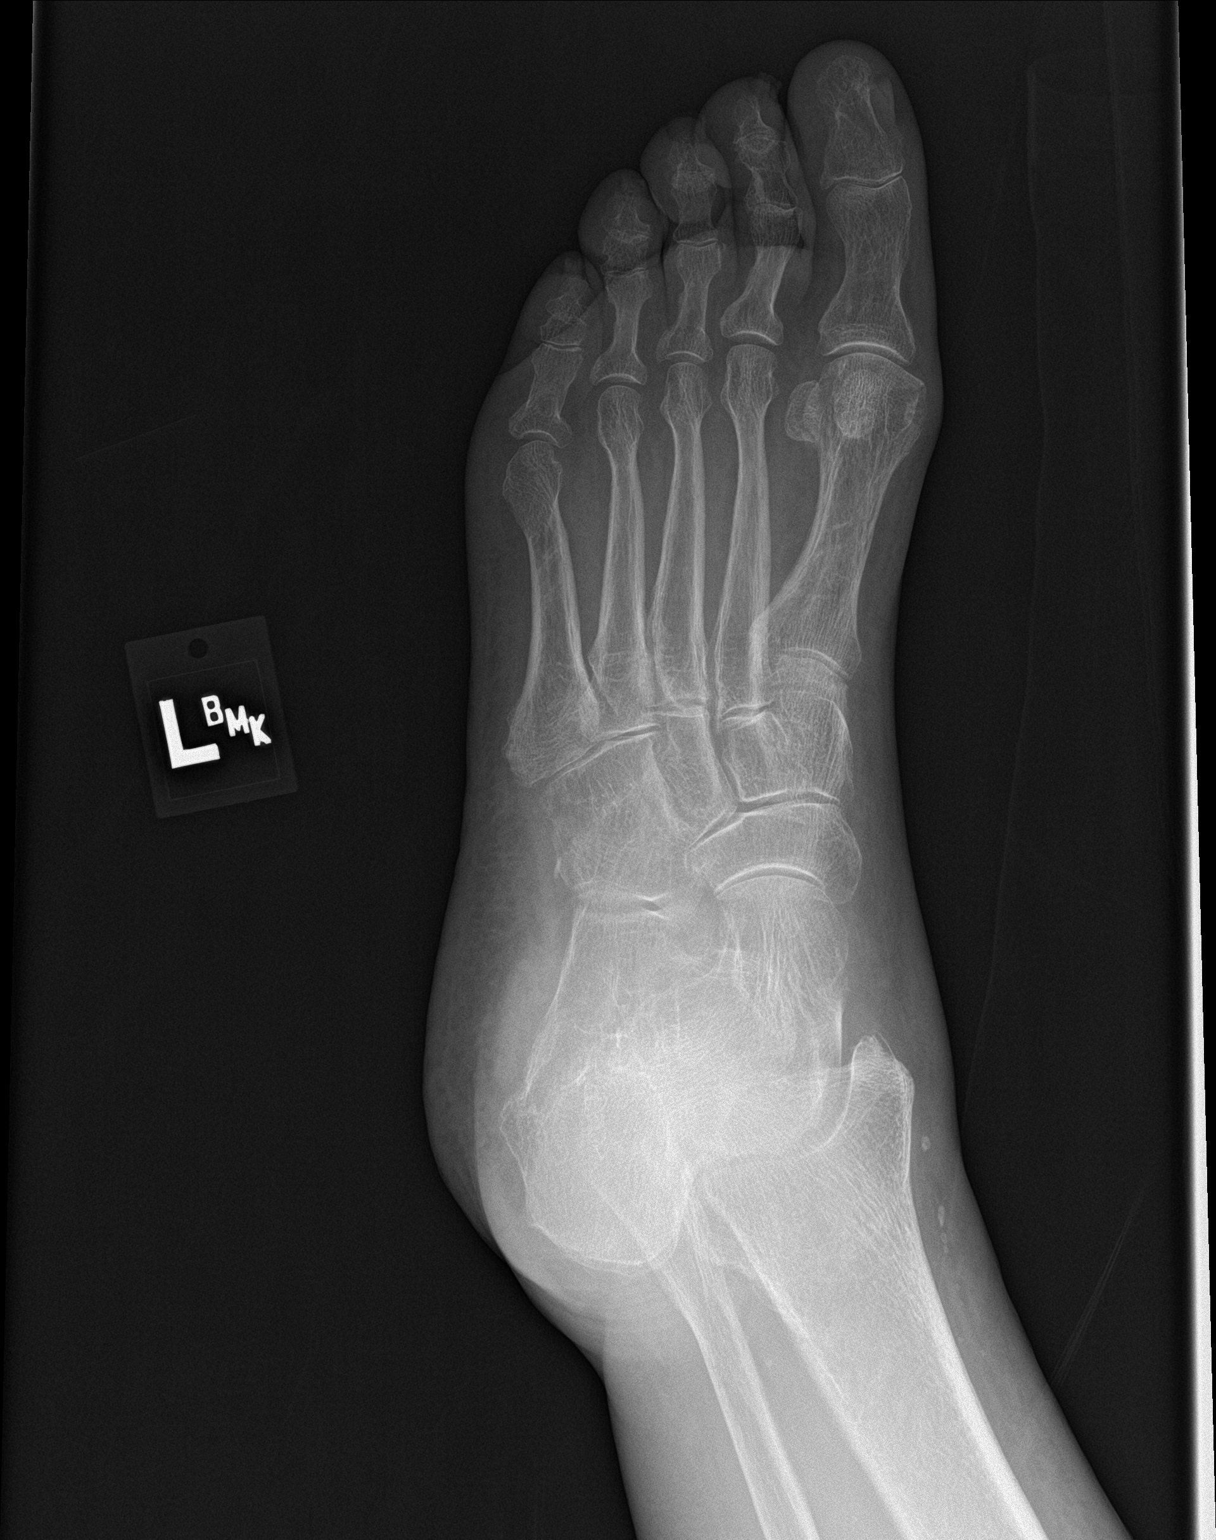
[im 3/3]
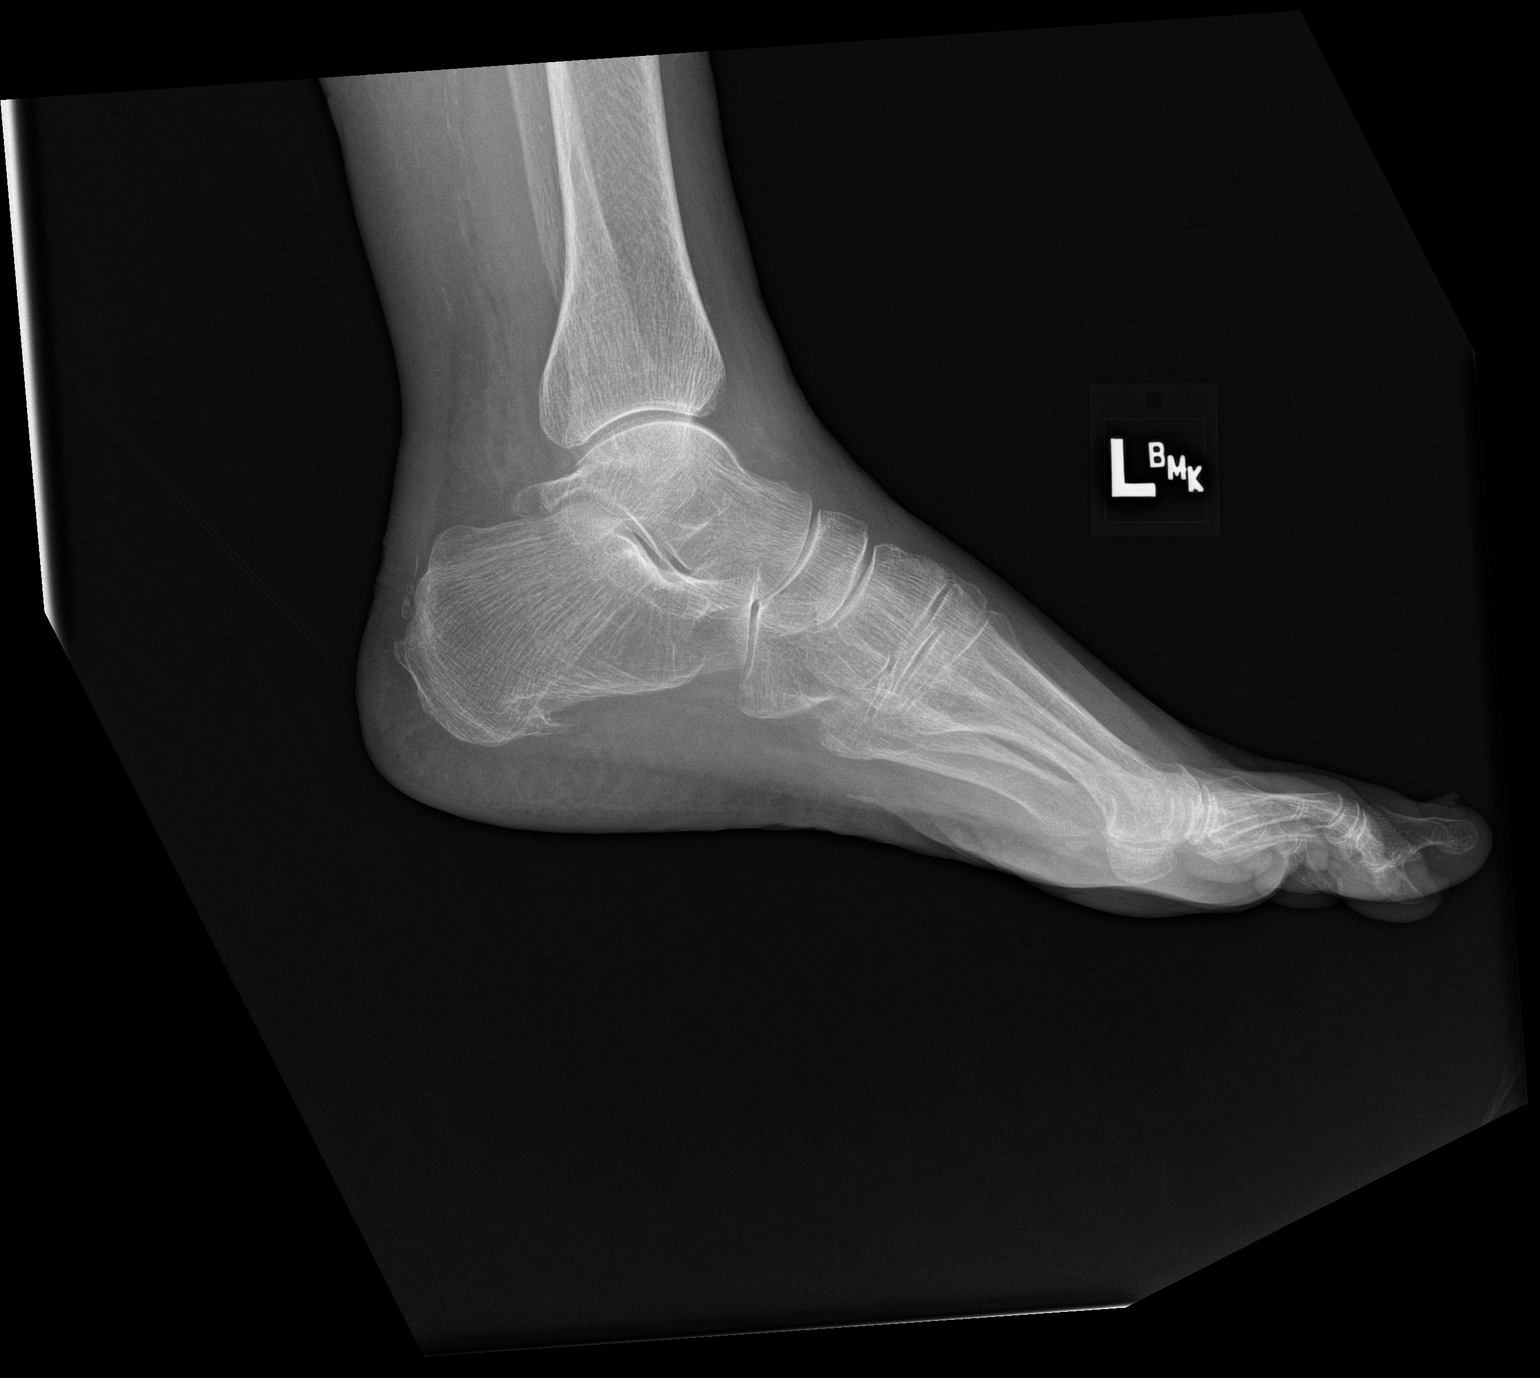

[3 of 3 positions shown; findings below may reference images not displayed]

FINDINGS: Osteopenia.  No fractures are seen.  No other acute abnormalities.
IMPRESSION: Negative.

## 2019-08-25 DIAGNOSIS — I480 Paroxysmal atrial fibrillation: Secondary | ICD-10-CM | POA: Diagnosis not present

## 2019-08-30 DIAGNOSIS — R609 Edema, unspecified: Secondary | ICD-10-CM | POA: Diagnosis not present

## 2019-08-30 DIAGNOSIS — I361 Nonrheumatic tricuspid (valve) insufficiency: Secondary | ICD-10-CM | POA: Diagnosis not present

## 2019-08-30 DIAGNOSIS — J679 Hypersensitivity pneumonitis due to unspecified organic dust: Secondary | ICD-10-CM | POA: Diagnosis not present

## 2019-08-30 DIAGNOSIS — R0602 Shortness of breath: Secondary | ICD-10-CM | POA: Diagnosis not present

## 2019-08-30 DIAGNOSIS — J449 Chronic obstructive pulmonary disease, unspecified: Secondary | ICD-10-CM | POA: Diagnosis not present

## 2019-08-30 DIAGNOSIS — I480 Paroxysmal atrial fibrillation: Secondary | ICD-10-CM | POA: Diagnosis not present

## 2019-08-30 DIAGNOSIS — I10 Essential (primary) hypertension: Secondary | ICD-10-CM | POA: Diagnosis not present

## 2019-08-30 DIAGNOSIS — Z0000 Encounter for general adult medical examination without abnormal findings: Secondary | ICD-10-CM | POA: Diagnosis not present

## 2019-08-30 DIAGNOSIS — Z950 Presence of cardiac pacemaker: Secondary | ICD-10-CM | POA: Diagnosis not present

## 2019-08-30 DIAGNOSIS — I495 Sick sinus syndrome: Secondary | ICD-10-CM | POA: Diagnosis not present

## 2019-08-30 DIAGNOSIS — R001 Bradycardia, unspecified: Secondary | ICD-10-CM | POA: Diagnosis not present

## 2019-08-30 DIAGNOSIS — I878 Other specified disorders of veins: Secondary | ICD-10-CM | POA: Diagnosis not present

## 2019-08-30 DIAGNOSIS — R0789 Other chest pain: Secondary | ICD-10-CM | POA: Diagnosis not present

## 2019-09-06 DIAGNOSIS — J0191 Acute recurrent sinusitis, unspecified: Secondary | ICD-10-CM | POA: Diagnosis not present

## 2019-09-06 DIAGNOSIS — I495 Sick sinus syndrome: Secondary | ICD-10-CM | POA: Diagnosis not present

## 2019-09-06 DIAGNOSIS — R600 Localized edema: Secondary | ICD-10-CM | POA: Diagnosis not present

## 2019-09-06 DIAGNOSIS — Z6833 Body mass index (BMI) 33.0-33.9, adult: Secondary | ICD-10-CM | POA: Diagnosis not present

## 2019-09-06 DIAGNOSIS — I10 Essential (primary) hypertension: Secondary | ICD-10-CM | POA: Diagnosis not present

## 2019-09-06 DIAGNOSIS — J449 Chronic obstructive pulmonary disease, unspecified: Secondary | ICD-10-CM | POA: Diagnosis not present

## 2019-09-06 DIAGNOSIS — I480 Paroxysmal atrial fibrillation: Secondary | ICD-10-CM | POA: Diagnosis not present

## 2019-09-06 DIAGNOSIS — I38 Endocarditis, valve unspecified: Secondary | ICD-10-CM | POA: Diagnosis not present

## 2019-09-06 DIAGNOSIS — J679 Hypersensitivity pneumonitis due to unspecified organic dust: Secondary | ICD-10-CM | POA: Diagnosis not present

## 2019-09-22 DIAGNOSIS — I480 Paroxysmal atrial fibrillation: Secondary | ICD-10-CM | POA: Diagnosis not present

## 2019-10-10 DIAGNOSIS — R0602 Shortness of breath: Secondary | ICD-10-CM | POA: Diagnosis not present

## 2019-10-10 DIAGNOSIS — I361 Nonrheumatic tricuspid (valve) insufficiency: Secondary | ICD-10-CM | POA: Diagnosis not present

## 2019-10-13 DIAGNOSIS — J449 Chronic obstructive pulmonary disease, unspecified: Secondary | ICD-10-CM | POA: Diagnosis not present

## 2019-10-13 DIAGNOSIS — I38 Endocarditis, valve unspecified: Secondary | ICD-10-CM | POA: Diagnosis not present

## 2019-10-13 DIAGNOSIS — R0602 Shortness of breath: Secondary | ICD-10-CM | POA: Diagnosis not present

## 2019-10-13 DIAGNOSIS — I495 Sick sinus syndrome: Secondary | ICD-10-CM | POA: Diagnosis not present

## 2019-10-13 DIAGNOSIS — I480 Paroxysmal atrial fibrillation: Secondary | ICD-10-CM | POA: Diagnosis not present

## 2019-10-13 DIAGNOSIS — I10 Essential (primary) hypertension: Secondary | ICD-10-CM | POA: Diagnosis not present

## 2019-10-13 DIAGNOSIS — R8290 Unspecified abnormal findings in urine: Secondary | ICD-10-CM | POA: Diagnosis not present

## 2019-10-13 DIAGNOSIS — R399 Unspecified symptoms and signs involving the genitourinary system: Secondary | ICD-10-CM | POA: Diagnosis not present

## 2019-10-13 DIAGNOSIS — I361 Nonrheumatic tricuspid (valve) insufficiency: Secondary | ICD-10-CM | POA: Diagnosis not present

## 2019-10-13 DIAGNOSIS — Z950 Presence of cardiac pacemaker: Secondary | ICD-10-CM | POA: Diagnosis not present

## 2019-10-20 DIAGNOSIS — I480 Paroxysmal atrial fibrillation: Secondary | ICD-10-CM | POA: Diagnosis not present

## 2019-10-21 ENCOUNTER — Ambulatory Visit: Payer: PPO

## 2019-10-27 ENCOUNTER — Ambulatory Visit: Payer: PPO

## 2019-11-01 DIAGNOSIS — I872 Venous insufficiency (chronic) (peripheral): Secondary | ICD-10-CM | POA: Diagnosis not present

## 2019-11-01 DIAGNOSIS — L97911 Non-pressure chronic ulcer of unspecified part of right lower leg limited to breakdown of skin: Secondary | ICD-10-CM | POA: Diagnosis not present

## 2019-11-01 DIAGNOSIS — L219 Seborrheic dermatitis, unspecified: Secondary | ICD-10-CM | POA: Diagnosis not present

## 2019-11-17 DIAGNOSIS — I480 Paroxysmal atrial fibrillation: Secondary | ICD-10-CM | POA: Diagnosis not present

## 2019-12-08 DIAGNOSIS — I480 Paroxysmal atrial fibrillation: Secondary | ICD-10-CM | POA: Diagnosis not present

## 2019-12-19 DIAGNOSIS — J679 Hypersensitivity pneumonitis due to unspecified organic dust: Secondary | ICD-10-CM | POA: Diagnosis not present

## 2019-12-19 DIAGNOSIS — I10 Essential (primary) hypertension: Secondary | ICD-10-CM | POA: Diagnosis not present

## 2019-12-19 DIAGNOSIS — R600 Localized edema: Secondary | ICD-10-CM | POA: Diagnosis not present

## 2019-12-19 DIAGNOSIS — J449 Chronic obstructive pulmonary disease, unspecified: Secondary | ICD-10-CM | POA: Diagnosis not present

## 2019-12-19 DIAGNOSIS — Z6832 Body mass index (BMI) 32.0-32.9, adult: Secondary | ICD-10-CM | POA: Diagnosis not present

## 2019-12-19 DIAGNOSIS — I480 Paroxysmal atrial fibrillation: Secondary | ICD-10-CM | POA: Diagnosis not present

## 2019-12-19 DIAGNOSIS — R0602 Shortness of breath: Secondary | ICD-10-CM | POA: Diagnosis not present

## 2019-12-29 DIAGNOSIS — I480 Paroxysmal atrial fibrillation: Secondary | ICD-10-CM | POA: Diagnosis not present

## 2019-12-29 DIAGNOSIS — J679 Hypersensitivity pneumonitis due to unspecified organic dust: Secondary | ICD-10-CM | POA: Diagnosis not present

## 2019-12-29 DIAGNOSIS — J449 Chronic obstructive pulmonary disease, unspecified: Secondary | ICD-10-CM | POA: Diagnosis not present

## 2019-12-29 DIAGNOSIS — I495 Sick sinus syndrome: Secondary | ICD-10-CM | POA: Diagnosis not present

## 2019-12-29 DIAGNOSIS — I10 Essential (primary) hypertension: Secondary | ICD-10-CM | POA: Diagnosis not present

## 2019-12-29 DIAGNOSIS — I38 Endocarditis, valve unspecified: Secondary | ICD-10-CM | POA: Diagnosis not present

## 2019-12-29 DIAGNOSIS — R600 Localized edema: Secondary | ICD-10-CM | POA: Diagnosis not present

## 2019-12-29 DIAGNOSIS — Z6833 Body mass index (BMI) 33.0-33.9, adult: Secondary | ICD-10-CM | POA: Diagnosis not present

## 2020-01-03 DIAGNOSIS — R001 Bradycardia, unspecified: Secondary | ICD-10-CM | POA: Diagnosis not present

## 2020-01-05 DIAGNOSIS — I361 Nonrheumatic tricuspid (valve) insufficiency: Secondary | ICD-10-CM | POA: Diagnosis not present

## 2020-01-05 DIAGNOSIS — I38 Endocarditis, valve unspecified: Secondary | ICD-10-CM | POA: Diagnosis not present

## 2020-01-05 DIAGNOSIS — Z01818 Encounter for other preprocedural examination: Secondary | ICD-10-CM | POA: Diagnosis not present

## 2020-01-05 DIAGNOSIS — I5032 Chronic diastolic (congestive) heart failure: Secondary | ICD-10-CM | POA: Diagnosis not present

## 2020-01-05 DIAGNOSIS — I10 Essential (primary) hypertension: Secondary | ICD-10-CM | POA: Diagnosis not present

## 2020-01-05 DIAGNOSIS — I480 Paroxysmal atrial fibrillation: Secondary | ICD-10-CM | POA: Diagnosis not present

## 2020-01-05 DIAGNOSIS — J9811 Atelectasis: Secondary | ICD-10-CM | POA: Diagnosis not present

## 2020-01-05 DIAGNOSIS — J679 Hypersensitivity pneumonitis due to unspecified organic dust: Secondary | ICD-10-CM | POA: Diagnosis not present

## 2020-01-05 DIAGNOSIS — R0602 Shortness of breath: Secondary | ICD-10-CM | POA: Diagnosis not present

## 2020-01-05 DIAGNOSIS — J449 Chronic obstructive pulmonary disease, unspecified: Secondary | ICD-10-CM | POA: Diagnosis not present

## 2020-01-05 DIAGNOSIS — Z01810 Encounter for preprocedural cardiovascular examination: Secondary | ICD-10-CM | POA: Diagnosis not present

## 2020-01-05 DIAGNOSIS — R739 Hyperglycemia, unspecified: Secondary | ICD-10-CM | POA: Diagnosis not present

## 2020-01-05 DIAGNOSIS — E785 Hyperlipidemia, unspecified: Secondary | ICD-10-CM | POA: Diagnosis not present

## 2020-01-05 DIAGNOSIS — Z950 Presence of cardiac pacemaker: Secondary | ICD-10-CM | POA: Diagnosis not present

## 2020-01-05 DIAGNOSIS — I495 Sick sinus syndrome: Secondary | ICD-10-CM | POA: Diagnosis not present

## 2020-01-05 DIAGNOSIS — Z0000 Encounter for general adult medical examination without abnormal findings: Secondary | ICD-10-CM | POA: Diagnosis not present

## 2020-01-09 ENCOUNTER — Other Ambulatory Visit: Payer: Self-pay

## 2020-01-09 ENCOUNTER — Other Ambulatory Visit
Admission: RE | Admit: 2020-01-09 | Discharge: 2020-01-09 | Disposition: A | Discharge status: Home / Self Care | Payer: PPO | Source: Ambulatory Visit | Attending: Cardiology | Admitting: Cardiology

## 2020-01-09 DIAGNOSIS — Z01812 Encounter for preprocedural laboratory examination: Secondary | ICD-10-CM | POA: Diagnosis not present

## 2020-01-09 DIAGNOSIS — Z20822 Contact with and (suspected) exposure to covid-19: Secondary | ICD-10-CM | POA: Insufficient documentation

## 2020-01-10 LAB — SARS CORONAVIRUS 2 (TAT 6-24 HRS): SARS Coronavirus 2: NEGATIVE

## 2020-01-11 ENCOUNTER — Encounter
Admission: RE | Disposition: A | Discharge status: Home / Self Care | Payer: Self-pay | Source: Home / Self Care | Attending: Cardiology

## 2020-01-11 ENCOUNTER — Ambulatory Visit: Payer: PPO | Admitting: Anesthesiology

## 2020-01-11 ENCOUNTER — Ambulatory Visit
Admission: RE | Admit: 2020-01-11 | Discharge: 2020-01-11 | Disposition: A | Discharge status: Home / Self Care | Payer: PPO | Attending: Cardiology | Admitting: Cardiology

## 2020-01-11 ENCOUNTER — Other Ambulatory Visit: Payer: Self-pay

## 2020-01-11 ENCOUNTER — Encounter: Payer: Self-pay | Admitting: Cardiology

## 2020-01-11 DIAGNOSIS — I50812 Chronic right heart failure: Secondary | ICD-10-CM | POA: Insufficient documentation

## 2020-01-11 DIAGNOSIS — I480 Paroxysmal atrial fibrillation: Secondary | ICD-10-CM | POA: Insufficient documentation

## 2020-01-11 DIAGNOSIS — Z888 Allergy status to other drugs, medicaments and biological substances status: Secondary | ICD-10-CM | POA: Insufficient documentation

## 2020-01-11 DIAGNOSIS — Z96651 Presence of right artificial knee joint: Secondary | ICD-10-CM | POA: Insufficient documentation

## 2020-01-11 DIAGNOSIS — E785 Hyperlipidemia, unspecified: Secondary | ICD-10-CM | POA: Diagnosis not present

## 2020-01-11 DIAGNOSIS — Z7901 Long term (current) use of anticoagulants: Secondary | ICD-10-CM | POA: Diagnosis not present

## 2020-01-11 DIAGNOSIS — Z45010 Encounter for checking and testing of cardiac pacemaker pulse generator [battery]: Secondary | ICD-10-CM | POA: Insufficient documentation

## 2020-01-11 DIAGNOSIS — R0602 Shortness of breath: Secondary | ICD-10-CM | POA: Diagnosis not present

## 2020-01-11 DIAGNOSIS — Z7951 Long term (current) use of inhaled steroids: Secondary | ICD-10-CM | POA: Insufficient documentation

## 2020-01-11 DIAGNOSIS — I10 Essential (primary) hypertension: Secondary | ICD-10-CM | POA: Diagnosis not present

## 2020-01-11 DIAGNOSIS — J449 Chronic obstructive pulmonary disease, unspecified: Secondary | ICD-10-CM | POA: Insufficient documentation

## 2020-01-11 DIAGNOSIS — Z8249 Family history of ischemic heart disease and other diseases of the circulatory system: Secondary | ICD-10-CM | POA: Diagnosis not present

## 2020-01-11 DIAGNOSIS — Z79899 Other long term (current) drug therapy: Secondary | ICD-10-CM | POA: Diagnosis not present

## 2020-01-11 DIAGNOSIS — I2510 Atherosclerotic heart disease of native coronary artery without angina pectoris: Secondary | ICD-10-CM | POA: Diagnosis not present

## 2020-01-11 DIAGNOSIS — Z880 Allergy status to penicillin: Secondary | ICD-10-CM | POA: Diagnosis not present

## 2020-01-11 DIAGNOSIS — I110 Hypertensive heart disease with heart failure: Secondary | ICD-10-CM | POA: Insufficient documentation

## 2020-01-11 DIAGNOSIS — Z87891 Personal history of nicotine dependence: Secondary | ICD-10-CM | POA: Insufficient documentation

## 2020-01-11 DIAGNOSIS — I081 Rheumatic disorders of both mitral and tricuspid valves: Secondary | ICD-10-CM | POA: Insufficient documentation

## 2020-01-11 DIAGNOSIS — E669 Obesity, unspecified: Secondary | ICD-10-CM | POA: Diagnosis not present

## 2020-01-11 DIAGNOSIS — I255 Ischemic cardiomyopathy: Secondary | ICD-10-CM | POA: Diagnosis not present

## 2020-01-11 DIAGNOSIS — I2729 Other secondary pulmonary hypertension: Secondary | ICD-10-CM | POA: Diagnosis not present

## 2020-01-11 DIAGNOSIS — Z886 Allergy status to analgesic agent status: Secondary | ICD-10-CM | POA: Diagnosis not present

## 2020-01-11 DIAGNOSIS — I495 Sick sinus syndrome: Secondary | ICD-10-CM | POA: Insufficient documentation

## 2020-01-11 DIAGNOSIS — R001 Bradycardia, unspecified: Secondary | ICD-10-CM | POA: Diagnosis not present

## 2020-01-11 DIAGNOSIS — K649 Unspecified hemorrhoids: Secondary | ICD-10-CM | POA: Diagnosis not present

## 2020-01-11 HISTORY — PX: PACEMAKER INSERTION: SHX728

## 2020-01-11 SURGERY — INSERTION, CARDIAC PACEMAKER
Anesthesia: General | Laterality: Left

## 2020-01-11 MED ORDER — ONDANSETRON HCL 4 MG/2ML IJ SOLN
INTRAMUSCULAR | Status: AC
  Filled 2020-01-11: qty 2

## 2020-01-11 MED ORDER — LIDOCAINE 1 % OPTIME INJ - NO CHARGE
INTRAMUSCULAR | Status: DC | PRN
  Administered 2020-01-11: 20 mL

## 2020-01-11 MED ORDER — FENTANYL CITRATE (PF) 100 MCG/2ML IJ SOLN
INTRAMUSCULAR | Status: DC | PRN
  Administered 2020-01-11: 10 ug via INTRAVENOUS

## 2020-01-11 MED ORDER — FENTANYL CITRATE (PF) 100 MCG/2ML IJ SOLN: 25.0000 ug | INTRAMUSCULAR | Status: DC | PRN

## 2020-01-11 MED ORDER — GLYCOPYRROLATE 0.2 MG/ML IJ SOLN
INTRAMUSCULAR | Status: DC | PRN
  Administered 2020-01-11: .1 mg via INTRAVENOUS

## 2020-01-11 MED ORDER — ONDANSETRON HCL 4 MG/2ML IJ SOLN: 4.0000 mg | Freq: Four times a day (QID) | INTRAMUSCULAR | Status: DC | PRN

## 2020-01-11 MED ORDER — LIDOCAINE HCL (PF) 2 % IJ SOLN
INTRAMUSCULAR | Status: AC
  Filled 2020-01-11: qty 5

## 2020-01-11 MED ORDER — ONDANSETRON HCL 4 MG/2ML IJ SOLN: 4.0000 mg | Freq: Once | INTRAMUSCULAR | Status: DC | PRN

## 2020-01-11 MED ORDER — PROPOFOL 500 MG/50ML IV EMUL
INTRAVENOUS | Status: DC | PRN
  Administered 2020-01-11: 110 ug/kg/min via INTRAVENOUS

## 2020-01-11 MED ORDER — DEXAMETHASONE SODIUM PHOSPHATE 10 MG/ML IJ SOLN
INTRAMUSCULAR | Status: DC | PRN
  Administered 2020-01-11: 10 mg via INTRAVENOUS

## 2020-01-11 MED ORDER — MIDAZOLAM HCL 2 MG/2ML IJ SOLN
INTRAMUSCULAR | Status: AC
  Filled 2020-01-11: qty 2

## 2020-01-11 MED ORDER — VANCOMYCIN HCL IN DEXTROSE 1-5 GM/200ML-% IV SOLN
INTRAVENOUS | Status: AC
  Filled 2020-01-11: qty 200

## 2020-01-11 MED ORDER — PROPOFOL 10 MG/ML IV BOLUS
INTRAVENOUS | Status: DC | PRN
  Administered 2020-01-11 (×2): 10 mg via INTRAVENOUS

## 2020-01-11 MED ORDER — DEXAMETHASONE SODIUM PHOSPHATE 10 MG/ML IJ SOLN
INTRAMUSCULAR | Status: AC
  Filled 2020-01-11: qty 1

## 2020-01-11 MED ORDER — FENTANYL CITRATE (PF) 100 MCG/2ML IJ SOLN
INTRAMUSCULAR | Status: AC
  Filled 2020-01-11: qty 2

## 2020-01-11 MED ORDER — ONDANSETRON HCL 4 MG/2ML IJ SOLN
INTRAMUSCULAR | Status: DC | PRN
  Administered 2020-01-11: 4 mg via INTRAVENOUS

## 2020-01-11 MED ORDER — PROPOFOL 10 MG/ML IV BOLUS
INTRAVENOUS | Status: AC
  Filled 2020-01-11: qty 40

## 2020-01-11 MED ORDER — LACTATED RINGERS IV SOLN: INTRAVENOUS | Status: DC

## 2020-01-11 MED ORDER — PROPOFOL 10 MG/ML IV BOLUS
INTRAVENOUS | Status: AC
  Filled 2020-01-11: qty 20

## 2020-01-11 MED ORDER — GENTAMICIN SULFATE 40 MG/ML IJ SOLN
INTRAMUSCULAR | Status: AC
  Filled 2020-01-11: qty 2

## 2020-01-11 MED ORDER — VANCOMYCIN HCL IN DEXTROSE 1-5 GM/200ML-% IV SOLN
1000.0000 mg | Freq: Once | INTRAVENOUS | Status: AC
  Administered 2020-01-11: 12:00:00 1000 mg via INTRAVENOUS

## 2020-01-11 MED ORDER — CLARITHROMYCIN 250 MG PO TABS: 250.0000 mg | ORAL_TABLET | Freq: Two times a day (BID) | ORAL | 0 refills | Status: AC

## 2020-01-11 MED ORDER — ACETAMINOPHEN 325 MG PO TABS: 325.0000 mg | ORAL_TABLET | ORAL | Status: DC | PRN

## 2020-01-11 MED ORDER — EPHEDRINE 5 MG/ML INJ
INTRAVENOUS | Status: AC
  Filled 2020-01-11: qty 10

## 2020-01-11 MED ORDER — EPHEDRINE SULFATE 50 MG/ML IJ SOLN
INTRAMUSCULAR | Status: DC | PRN
  Administered 2020-01-11: 5 mg via INTRAVENOUS

## 2020-01-11 MED ORDER — GLYCOPYRROLATE 0.2 MG/ML IJ SOLN
INTRAMUSCULAR | Status: AC
  Filled 2020-01-11: qty 1

## 2020-01-11 SURGICAL SUPPLY — 25 items
BAG DECANTER FOR FLEXI CONT (MISCELLANEOUS) ×3 IMPLANT
BLADE PHOTON ILLUMINATED (MISCELLANEOUS) ×1 IMPLANT
CANISTER SUCT 1200ML W/VALVE (MISCELLANEOUS) ×3 IMPLANT
CHLORAPREP W/TINT 26 (MISCELLANEOUS) ×3 IMPLANT
COVER LIGHT HANDLE STERIS (MISCELLANEOUS) ×6 IMPLANT
COVER MAYO STAND REUSABLE (DRAPES) ×3 IMPLANT
COVER WAND RF STERILE (DRAPES) ×3 IMPLANT
DRSG TEGADERM 4X4.75 (GAUZE/BANDAGES/DRESSINGS) ×3 IMPLANT
DRSG TELFA 4X3 1S NADH ST (GAUZE/BANDAGES/DRESSINGS) ×3 IMPLANT
ELECT REM PT RETURN 9FT ADLT (ELECTROSURGICAL) ×3
ELECTRODE REM PT RTRN 9FT ADLT (ELECTROSURGICAL) ×1 IMPLANT
GLOVE BIO SURGEON STRL SZ7.5 (GLOVE) ×3 IMPLANT
GLOVE BIO SURGEON STRL SZ8 (GLOVE) ×3 IMPLANT
GOWN STRL REUS W/ TWL LRG LVL3 (GOWN DISPOSABLE) ×1 IMPLANT
GOWN STRL REUS W/ TWL XL LVL3 (GOWN DISPOSABLE) ×1 IMPLANT
GOWN STRL REUS W/TWL LRG LVL3 (GOWN DISPOSABLE) ×3
GOWN STRL REUS W/TWL XL LVL3 (GOWN DISPOSABLE) ×3
IPG PACE AZUR XT DR MRI W1DR01 (Pacemaker) IMPLANT
KIT TURNOVER KIT A (KITS) ×3 IMPLANT
KIT WRENCH (KITS) ×2 IMPLANT
MARKER SKIN DUAL TIP RULER LAB (MISCELLANEOUS) ×3 IMPLANT
PACE AZURE XT DR MRI W1DR01 (Pacemaker) ×3 IMPLANT
PACK PACE INSERTION (MISCELLANEOUS) ×3 IMPLANT
PAD ONESTEP ZOLL R SERIES ADT (MISCELLANEOUS) ×3 IMPLANT
STRAP SAFETY 5IN WIDE (MISCELLANEOUS) ×1 IMPLANT

## 2020-01-11 NOTE — OR Nursing (Signed)
Explant of pacemaker generator.   ADAPTA SN [id number]

## 2020-01-11 NOTE — Anesthesia Preprocedure Evaluation (Signed)
Anesthesia Evaluation  Patient identified by MRN, date of birth, ID band Patient awake    Reviewed: Allergy & Precautions, NPO status , Patient's Chart, lab work & pertinent test results  History of Anesthesia Complications Negative for: history of anesthetic complications  Airway Mallampati: II  TM Distance: >3 FB Neck ROM: Full    Dental no notable dental hx.    Pulmonary neg sleep apnea, COPD,  COPD inhaler, former smoker,    breath sounds clear to auscultation- rhonchi (-) wheezing      Cardiovascular hypertension, Pt. on medications + CAD and + Peripheral Vascular Disease  (-) Past MI, (-) Cardiac Stents and (-) CABG + dysrhythmias Atrial Fibrillation + pacemaker  Rhythm:Regular Rate:Normal - Systolic murmurs and - Diastolic murmurs Echo [id number]: NORMAL LEFT VENTRICULAR SYSTOLIC FUNCTION  WITH MILD LVH MILD RV SYSTOLIC DYSFUNCTION  SEVERE VALVULAR REGURGITATION  NO VALVULAR STENOSIS MILD MR, PR SEVERE TR MODERATE PHTN TRIVIAL AR EF >55%   Neuro/Psych neg Seizures negative neurological ROS  negative psych ROS   GI/Hepatic negative GI ROS, Neg liver ROS,   Endo/Other  negative endocrine ROSneg diabetes  Renal/GU negative Renal ROS     Musculoskeletal negative musculoskeletal ROS (+)   Abdominal (+) + obese,   Peds  Hematology negative hematology ROS (+)   Anesthesia Other Findings Past Medical History: 1978: Breast cancer ([place])     Comment:  left No date: CAD (coronary artery disease) No date: DVT (deep venous thrombosis) (HCC) No date: Hemorrhoids No date: History of knee replacement, total No date: Hypertension No date: Ischemic cardiomyopathy No date: Peripheral arterial disease (HCC) No date: Seizure disorder ([place])   Reproductive/Obstetrics                             Anesthesia Physical Anesthesia Plan  ASA: III  Anesthesia Plan: General   Post-op Pain  Management:    Induction: Intravenous  PONV Risk Score and Plan: 2 and Propofol infusion  Airway Management Planned: Natural Airway  Additional Equipment:   Intra-op Plan:   Post-operative Plan:   Informed Consent: I have reviewed the patients History and Physical, chart, labs and discussed the procedure including the risks, benefits and alternatives for the proposed anesthesia with the patient or authorized representative who has indicated his/her understanding and acceptance.     Dental advisory given  Plan Discussed with: CRNA and Anesthesiologist  Anesthesia Plan Comments:         Anesthesia Quick Evaluation

## 2020-01-11 NOTE — H&P (Signed)
Jump to Section ? Document InformationECG ResultsEncounter DetailsGoalsImaging ResultsLab ResultsLast Filed Vital SignsPatient ContactsPatient DemographicsPatient InstructionsPlan of TreatmentProceduresProgress NotesReason for ReferralReason for VisitSocial HistoryVisit Diagnoses [name] [name] Encounter Summary, generated on Apr. 21, 2021April 21, 2021 Printout Information  Document Contents Document Received Date Document Source Organization  Office Visit Apr. 21, 2021April 21, 2021 [place]   Patient Demographics - 86 y.o. Female; born [birth year]  Patient Address Communication Language Race / Ethnicity Marital Status  [address] [place], [place] [address] [phone] [facility]) [phone] (Home) [email or web address] English (Preferred) White / Not Hispanic or Latino Widowed  Reason for Referral  (Routine) (Routine)  Status Reason Specialty Diagnoses / Procedures Referred By Contact Referred To Contact  Pending Review   Diagnoses  Preop cardiovascular exam    Procedures  Prothrombin Time (INR)  Isaias Cowman, MD  [address]  [facility]  [place], [place] [address]  Phone: [phone]  Fax: [phone]       Electronically signed by Isaias Cowman MD at 01/05/2020 11:25 AM EDT     Procedure (Routine) Procedure (Routine)  Status Reason Specialty Diagnoses / Procedures Referred By Contact Referred To Contact  Pending Review   Diagnoses  Preop cardiovascular exam    Procedures  ECG 12-lead  Isaias Cowman, MD  [address]  [facility]  [place], [place] [address]  Phone: [phone]  Fax: [phone]       Electronically signed by Isaias Cowman MD at 01/05/2020 11:25 AM EDT   Reason for Visit  Reason Comments  Follow-up 3 mon. pacer chg out due  Shortness of Breath still has  Dizziness still has  Edema occasionally    Encounter Details  Date Type Department Care Team Description  01/05/2020 Office Visit [facility]  [place], [place] [address]  [phone]  Isaias Cowman, MD  [place]  [facility]  [place], [place] [address]  [phone]  [phone] (Fax)  Tachy-brady syndrome (CMS-HCC) (Primary Dx);  Paroxysmal atrial fibrillation (CMS-HCC);  Essential hypertension;  Nonrheumatic tricuspid valve regurgitation;  Chronic obstructive pulmonary disease, unspecified COPD type (CMS-HCC);  Hyperlipidemia, unspecified hyperlipidemia type;  SOB (shortness of breath);  Pacemaker;  Preop cardiovascular exam   Social History - documented as of this encounter Tobacco Use Types Packs/Day Years Used Date  Former Smoker Cigarettes   Quit: 09/22/1986  Smokeless Tobacco: Never Used      Comments: QUIT 36yrs ago   Alcohol Use Standard Drinks/Week Comments  No 0 (1 standard drink = 0.6 oz pure alcohol)    Sex Assigned at Birth Date Recorded  Not on file    COVID-19 Exposure Response Date Recorded  In the last month, have you been in contact with someone who was confirmed or suspected to have Coronavirus / COVID-19? No / Unsure 01/05/2020 9:58 AM EDT   Last Filed Vital Signs - documented in this encounter Vital Sign Reading Time Taken Comments  Blood Pressure 138/62 01/05/2020 11:16 AM EDT   Pulse 69 01/05/2020 11:16 AM EDT   Temperature - -   Respiratory Rate - -   Oxygen Saturation 92% 01/05/2020 11:16 AM EDT   Inhaled Oxygen Concentration - -   Weight 81.2 kg (179 lb) 01/05/2020 11:16 AM EDT   Height 160 cm (5\' 3" ) 01/05/2020 11:16 AM EDT   Body Mass Index 31.71 01/05/2020 11:16 AM EDT    Patient Instructions - documented in this encounter Patient Instructions Isaias Cowman, MD -  01/05/2020 11:15 AM EDT  Formatting of this note might be different from the original. Images from the original  note were not included.  Patient Education    DASH Diet: Care Instructions Your Care Instructions  The DASH diet is an eating plan that can help lower your blood pressure. DASH stands for Dietary Approaches to Stop Hypertension. Hypertension is high blood pressure. The DASH diet focuses on eating foods that are high in calcium, potassium, and magnesium. These nutrients can lower blood pressure. The foods that are highest in these nutrients are fruits, vegetables, low-fat dairy products, nuts, seeds, and legumes. But taking calcium, potassium, and magnesium supplements instead of eating foods that are high in those nutrients does not have the same effect. The DASH diet also includes whole grains, fish, and poultry. The DASH diet is one of several lifestyle changes your doctor may recommend to lower your high blood pressure. Your doctor may also want you to decrease the amount of sodium in your diet. Lowering sodium while following the DASH diet can lower blood pressure even further than just the DASH diet alone. Follow-up care is a key part of your treatment and safety. Be sure to make and go to all appointments, and call your doctor if you are having problems. It's also a good idea to know your test results and keep a list of the medicines you take. How can you care for yourself at home? Following the DASH diet  Eat 4 to 5 servings of fruit each day. A serving is 1 medium-sized piece of fruit,  cup chopped or canned fruit, 1/4 cup dried fruit, or 4 ounces ( cup) of fruit juice. Choose fruit more often than fruit juice.  Eat 4 to 5 servings of vegetables each day. A serving is 1 cup of lettuce or raw leafy vegetables,  cup of chopped or cooked vegetables, or 4 ounces ( cup) of vegetable juice. Choose vegetables more often than vegetable juice.  Get 2 to 3 servings of low-fat and fat-free dairy each day. A serving is 8 ounces of milk, 1 cup of yogurt, or 1  ounces of cheese.  Eat 6 to 8  servings of grains each day. A serving is 1 slice of bread, 1 ounce of dry cereal, or  cup of cooked rice, pasta, or cooked cereal. Try to choose whole-grain products as much as possible.  Limit lean meat, poultry, and fish to 2 servings each day. A serving is 3 ounces, about the size of a deck of cards.  Eat 4 to 5 servings of nuts, seeds, and legumes (cooked dried beans, lentils, and split peas) each week. A serving is 1/3 cup of nuts, 2 tablespoons of seeds, or  cup of cooked beans or peas.  Limit fats and oils to 2 to 3 servings each day. A serving is 1 teaspoon of vegetable oil or 2 tablespoons of salad dressing.  Limit sweets and added sugars to 5 servings or less a week. A serving is 1 tablespoon jelly or jam,  cup sorbet, or 1 cup of lemonade.  Eat less than 2,300 milligrams (mg) of sodium a day. If you limit your sodium to 1,500 mg a day, you can lower your blood pressure even more.  Be aware that all of these are the suggested number of servings for people who eat 1,800 to 2,000 calories a day. Your recommended number of servings may be different if you need more or fewer calories. Tips for success  Start small.  Do not try to make dramatic changes to your diet all at once. You might feel that you are missing out on your favorite foods and then be more likely to not follow the plan. Make small changes, and stick with them. Once those changes become habit, add a few more changes.  Try some of the following: ? Make it a goal to eat a fruit or vegetable at every meal and at snacks. This will make it easy to get the recommended amount of fruits and vegetables each day. ? Try yogurt topped with fruit and nuts for a snack or healthy dessert. ? Add lettuce, tomato, cucumber, and onion to sandwiches. ? Combine a ready-made pizza crust with low-fat mozzarella cheese and lots of vegetable toppings. Try using tomatoes, squash, spinach, broccoli, carrots, cauliflower, and onions. ? Have a  variety of cut-up vegetables with a low-fat dip as an appetizer instead of chips and dip. ? Sprinkle sunflower seeds or chopped almonds over salads. Or try adding chopped walnuts or almonds to cooked vegetables. ? Try some vegetarian meals using beans and peas. Add garbanzo or kidney beans to salads. Make burritos and tacos with mashed pinto beans or black beans. Where can you learn more? Log in to your [facility] MyChart account at [email or web address] and click on top menu option "Health" then select "Search Medical Library". Enter [phone] in the search box and click the magnify glass to learn more about "DASH Diet: Care Instructions." Current as of: August 31, 2020Content Version: 12.8  2006-2021 Healthwise, Incorporated.  Care instructions adapted under license by your healthcare professional. If you have questions about a medical condition or this instruction, always ask your healthcare professional. [place] any warranty or liability for your use of this information.    Patient Education    Learning About High Cholesterol What is high cholesterol?  High cholesterol means that you have too much cholesterol in your blood. Cholesterol is a type of fat. It's needed for many body functions, such as making new cells. Cholesterol is made by your body. It also comes from food you eat. Having high cholesterol can lead to the buildup of plaque in artery walls. This can increase your risk of heart disease and stroke. When your doctor talks about high cholesterol levels, he or she is talking about your total cholesterol and LDL cholesterol (the "bad" cholesterol) levels. Your doctor may also speak about HDL (the "good" cholesterol) levels. High HDL is linked with a lower risk for heart disease, heart attack, and stroke. Your cholesterol levels help your doctor find out your risk for having a heart attack or stroke. How can you prevent high cholesterol? A  heart-healthy lifestyle can help you prevent high cholesterol. This lifestyle helps lower your risk for a heart attack and stroke.  Eat heart-healthy foods. ? Eat fruits, vegetables, whole grains (like oatmeal), dried beans and peas, nuts and seeds, soy products (like tofu), and fat-free or low-fat dairy products. ? Replace butter, margarine, and hydrogenated or partially hydrogenated oils with olive and canola oils. (Canola oil margarine without trans fat is fine.) ? Replace red meat with fish, poultry, and soy protein (like tofu). ? Limit processed and packaged foods like chips, crackers, and cookies.  Be active. Exercise can improve your cholesterol level. Get at least 30 minutes of exercise on most days of the week. Walking is a good choice. You also may want to do other activities, such as running, swimming, cycling, or playing tennis or team sports.  Stay at a healthy weight. Lose weight if you need to.  Don't smoke. If you need help quitting, talk to your doctor about stop-smoking programs and medicines. These can increase your chances of quitting for good. How is high cholesterol treated? The goal of treatment is to reduce your chances of having a heart attack or stroke. The goal is not to lower your cholesterol numbers only.  You may make lifestyle changes, such as eating healthy foods, not smoking, losing weight, and being more active.  You may have to take medicine. Follow-up care is a key part of your treatment and safety. Be sure to make and go to all appointments, and call your doctor if you are having problems. It's also a good idea to know your test results and keep a list of the medicines you take. Where can you learn more? Log in to your [facility] MyChart account at [email or web address] and click on top menu option "Health" then select "Search Medical Library". Enter [phone] in the search box and click the magnify glass to learn more about "Learning About High  Cholesterol." Current as of: August 31, 2020Content Version: 12.8  2006-2021 Healthwise, Incorporated.  Care instructions adapted under license by your healthcare professional. If you have questions about a medical condition or this instruction, always ask your healthcare professional. [place] any warranty or liability for your use of this information.     Electronically signed by Isaias Cowman, MD at 01/05/2020 11:22 AM EDT     Progress Notes - documented in this encounter Isaias Cowman, MD - 01/05/2020 11:15 AM EDT Formatting of this note is different from the original. Established Patient Visit   Chief Complaint: Chief Complaint  Patient presents with  . Follow-up  3 mon. pacer chg out due  . Shortness of Breath  still has  . Dizziness  still has  . Edema  occasionally  Date of Service: 01/05/2020 Date of Birth: 10-26-1926 PCP: Azzie Glatter, MD  History of Present Illness: Ms. Cashion is a 86 y.o.female patient who returns for  1. Paroxysmal atrial fibrillation 2. Dual-chamber pacemaker for sick sinus syndrome 10/26/2007 3. Essential hypertension 4. Hyperlipidemia 5. COPD 6. Moderate to severe tricuspid regurgitation  The patient returns today for follow-up, reports doing about the same. She has occasional episodes of mild chest discomfort unrelated to exertion. She has chronic exertional dyspnea due to underlying COPD. She denies palpitations or heart racing. She has chronic peripheral edema which is stable unchanged. The patient is not very active and does not exercise regularly. 2D echocardiogram on 10/10/2019 revealed normal left ventricular function with LVEF greater than 55% with moderate left atrial enlargement and severely enlarged right atrium with mild global right ventricular systolic dysfunction with severe tricuspid regurgitation, mild mitral and pulmonic regurgitation, and moderate pulmonary  hypertension. Previous 2D echocardiogram 05/14/2018 revealed LVEF of 50% with moderate to severe tricuspid regurgitation, and moderate mitral regurgitation. Pacemaker interrogation on on 01/03/2020 revealed elective replacement indication.  The patient has paroxysmal atrial fibrillation, chads vasc score 4 on warfarin. INR is 3.3 on 09/22/2019. The patient denies any bleeding side effects, with the exception of easy bruising.   The patient has essential hypertension, blood pressure well controlled, currently on diltiazem, metoprolol succinate, quinapril, furosemide and metolazone (every Monday), which are well tolerated without apparent side effects. The patient tries to follow a low-sodium, no added salt diet.  The patient has hyperlipidemia, LDL cholesterol is 67 on 08/30/2019, currently on atorvastatin, which is well tolerated, followed  by her primary care provider.   Past Medical and Surgical History  Past Medical History Past Medical History:  Diagnosis Date  . Allergic state  . Asthma without status asthmaticus, unspecified  . Atrial fibrillation (CMS-HCC)  . COPD (chronic obstructive pulmonary disease) (CMS-HCC)  . Esophageal stricture  Hiatal hernia  . Hyperlipidemia  . Hypertension  . Osteoporosis  . PAF (paroxysmal atrial fibrillation) (CMS-HCC)  MR/TR  . Urge urinary incontinence   Past Surgical History She has a past surgical history that includes Insert / replace / remove pacemaker; Cholecystectomy; Appendectomy; LEFT MASTECTOMY; Hysterectomy (1960); Rt shoulder surgery (2005); Left Hip replacement; Colonoscopy (2006); and Right total knee replacement.   Medications and Allergies  Current Medications  Current Outpatient Medications  Medication Sig Dispense Refill  . alendronate (FOSAMAX) 70 MG tablet Take 1 tablet (70 mg total) by mouth every 7 (seven) days Take with a full glass of water. Do not lie down for the next 30 min. 12 tablet 1  . atorvastatin (LIPITOR) 40 MG  tablet Take 1 tablet by mouth once daily 90 tablet 2  . betamethasone dipropionate, augmented, (DIPROLENE) 0.05 % ointment  . budesonide-formoteroL (SYMBICORT) 160-4.5 mcg/actuation inhaler Inhale 2 inhalations into the lungs 2 (two) times daily 3 Inhaler 5  . diltiazem (CARDIZEM CD) 180 MG CD capsule TAKE 1 CAPSULE (180 MG TOTAL) BY MOUTH ONCE DAILY 90 capsule 3  . FUROsemide (LASIX) 40 MG tablet Take 1 tablet by mouth every day 90 tablet 1  . ipratropium-albuteroL (DUO-NEB) nebulizer solution Take 3 mLs by nebulization 4 (four) times daily for 360 days 360 mL 3  . ketoconazole (NIZORAL) 2 % shampoo USE AS SHAMPOO EVERY OTHER DAY. LEAVE ON FOR 5 MINUTES BEFORE RINSING.  Marland Kitchen KLOR-CON M10 10 mEq ER tablet TAKE 1 TABLET (10 MEQ TOTAL) BY MOUTH ONCE DAILY 90 tablet 0  . metOLazone (ZAROXOLYN) 2.5 MG tablet Take 1 tablet (2.5 mg total) by mouth every Monday (Patient taking differently: Take 2.5 mg by mouth every Wednesday ) 12 tablet 1  . metoprolol succinate (TOPROL-XL) 50 MG XL tablet TAKE 1 TABLET BY MOUTH EVERY DAY 90 tablet 3  . montelukast (SINGULAIR) 10 mg tablet Take 1 tablet (10 mg total) by mouth once daily 90 tablet 3  . nitroGLYcerin (NITROSTAT) 0.4 MG SL tablet Place 1 tablet (0.4 mg total) under the tongue every 5 (five) minutes as needed for Chest pain May take up to 3 doses. 25 tablet 2  . omeprazole (PRILOSEC) 40 MG DR capsule Take 1 capsule (40 mg total) by mouth once daily 90 capsule 3  . predniSONE (DELTASONE) 10 MG tablet Take 1 tablet (10 mg total) by mouth once daily 4,4,4,3,3,3,2,2,2,1,1,1 and stop 30 tablet 0  . PROAIR HFA 90 mcg/actuation inhaler Inhale 2 inhalations into the lungs every 6 (six) hours as needed 8.5 g 12  . quinapriL (ACCUPRIL) 40 MG tablet Take 1 tablet (40 mg total) by mouth once daily 90 tablet 3  . tiotropium bromide (SPIRIVA RESPIMAT) 2.5 mcg/actuation inhalation spray Inhale 2 inhalations (5 mcg total) into the lungs once daily 4 g 5  . warfarin (COUMADIN) 5  MG tablet TAKE 1 TABLET BY MOUTH EVERY DAY 90 tablet 3   No current facility-administered medications for this visit.   Allergies: Penicillins, Beta-blockers (beta-adrenergic blocking agts), Mobic [meloxicam], Naprosyn [naproxen], and Nsaids (non-steroidal anti-inflammatory drug)  Social and Family History  Social History reports that she quit smoking about 33 years ago. Her smoking use included cigarettes. She  has never used smokeless tobacco. She reports that she does not drink alcohol and does not use drugs.  Family History Family History  Problem Relation Age of Onset  . Myocardial Infarction (Heart attack) Father  . Cancer Father  . Coronary Artery Disease (Blocked arteries around heart) Mother   Review of Systems   Review of Systems: The patient reports occasional postprandial brief chest pain, without exertional chest pain, with chronic exertional shortness of breath, without orthopnea, paroxysmal nocturnal dyspnea, with chronic pedal edema, with occasional palpitations, heart racing, without presyncope, syncope, with generalized weakness, with gait instability. Review of 10 Systems is negative except as described above.  Physical Examination   Vitals: BP 114/60, Pulse 79, Weight 185 lbs, O2 99% BP 138/62  Pulse 69  Ht 160 cm (5\' 3" )  Wt 81.2 kg (179 lb)  LMP (LMP Unknown)  SpO2 92%  BMI 31.71 kg/m  Ht:160 cm (5\' 3" ) Wt:81.2 kg (179 lb) [id number] surface area is 1.9 meters squared. Body mass index is 31.71 kg/m.  General: Alert and oriented. Well-appearing. No acute distress. HEENT: Pupils equally reactive to light and accomodation  Neck: Supple, no JVD Lungs: Normal effort of breathing; clear to auscultation bilaterally; no wheezes, rales, rhonchi Heart: Regular rate and rhythm. 2/6 systolic murmur Abdomen: nondistended, with normal bowel sounds Extremities: no cyanosis, clubbing, or edema Peripheral Pulses: 2+ radial bilaterally Skin: Warm, dry, no  diaphoresis  Assessment   86 y.o. female with  1. Tachy-brady syndrome (CMS-HCC)  2. Paroxysmal atrial fibrillation (CMS-HCC)  3. Essential hypertension  4. Nonrheumatic tricuspid valve regurgitation  5. Chronic obstructive pulmonary disease, unspecified COPD type (CMS-HCC)  6. Hyperlipidemia, unspecified hyperlipidemia type  7. SOB (shortness of breath)  8. Pacemaker  9. Preop cardiovascular exam   86 year old female with paroxysmal atrial fibrillation, chads vasc score of 4, currently on warfarin for stroke prevention. Patient has dual-chamber pacemaker for sick sinus syndrome. Recent pacemaker interrogation revealed elective replacement indication. The patient has essential hypertension, blood pressure normal today. Patient has hyperlipidemia, with excellent control of LDL cholesterol on atorvastatin. The patient has chronic exertional shortness of breath which significantly limits her activities, which is multifactorial with COPD, tricuspid regurgitation, and right heart failure. 2D echocardiogram revealed normal LV function, severe TR, moderate PHTN, mild right systolic dysfunction, severely right atrial enlargement. She currently appears stable on current diuretic regimen.  Plan   1. Continue current medications 2. Continue warfarin for stroke prevention, target INR 2.0-3.0 3. Counseled patient about low-sodium diet 4. DASH diet printed instructions given to the patient 5. Continue atorvastatin for hyperlipidemia management 6. Pacemaker generator change-out. The risk, benefits and alternatives of pacemaker generator change out were explained to the patient and informed written consent was obtained.  Orders Placed This Encounter  Procedures  . 2019 Novel Coronavirus (CoVID-19), NAA - [facility]  . X-ray chest PA and lateral  . Prothrombin Time (INR)  . ECG 12-lead   Return in about 1 week (around 01/12/2020), or after pacemaker change-out.  Isaias Cowman, MD PhD [facility]     Electronically signed by Isaias Cowman, MD at 01/05/2020 1:55 PM EDT   Plan of Treatment - documented as of this encounter Upcoming Encounters Upcoming Encounters  Date Type Specialty Care Team Description  02/02/2020 Ancillary Orders Lab    04/10/2020 Procedure visit Cardiology Isaias Cowman, MD  [place]  [facility]  [place], [place] [address]  [phone]  [phone] (Fax)    05/04/2020 Ancillary Orders Lab Azzie Glatter, MD  [place], [place] [address]  [phone]  [phone] (Fax)    05/11/2020 Office Visit Internal Medicine Azzie Glatter, MD  [address]  [place], [place] [address]  [phone]  [phone] (Fax)     Scheduled Orders Scheduled Orders  Name Type Priority Associated Diagnoses Order Schedule  2019 Novel Coronavirus (CoVID-19), NAA - [facility] Microbiology Routine Preop cardiovascular exam  Expected: 01/06/2020, Expires: 01/04/2021   Goals - documented as of this encounter Goal Patient Goal Type Associated Problems Recent Progress Patient-Stated? Author  Patient Goals  General  On track (05/12/2019 9:54 AM EDT) Yes Capps, Patrick North, RN  Note:   Formatting of this note might be different from the original. Patient reports would like to get to breathing better.   Maintain health/healthy lifestyle  Lifestyle  On track (05/12/2019 9:54 AM EDT) Yes Bernarda Caffey, CMA  Follow my doctor's care plan  Lifestyle  On track (05/12/2019 9:54 AM EDT) No Yvonne Kendall, CMA  Note:   Formatting of this note might be different from the original. Follow my doctors treatment plan and follow-up as scheduled. Take all medications as prescribed and report any changes as necessary.     Procedures - documented in this encounter Procedure Name Priority Date/Time Associated Diagnosis Comments   PROTHROMBIN TIME (INR) - [facility] AFFILIATE, KERNODLE Routine 01/05/2020 12:06 PM EDT Preop cardiovascular exam  Results for this procedure are in the results section.   PR ELECTROCARDIOGRAM, COMPLETE Routine 01/05/2020 11:32 AM EDT Preop cardiovascular exam  Results for this procedure are in the results section.    Lab Results - documented in this encounter  Prothrombin Time (INR) (01/05/2020 12:06 PM EDT) Prothrombin Time (INR) (01/05/2020 12:06 PM EDT)  Component Value Ref Range Performed At Pathologist Signature  Prothrombin Time 24.7 (H) 9.9 - 12.6 [facility] - LAB   Prothrombin INR 2.2 2.0 - 3.0 [place] - LAB    Prothrombin Time (INR) (01/05/2020 12:06 PM EDT)  Specimen  Blood   Prothrombin Time (INR) (01/05/2020 12:06 PM EDT)  Narrative Performed At  Patients on stable oral anticoagulant therapy, the target therapeutic range for INR is 2.0-3.0 in most cases.    Patients with prosthetic heart valves, the range is 2.5-3.5  [facility] - LAB    Prothrombin Time (INR) (01/05/2020 12:06 PM EDT)  Performing Organization Address City/State/ZIP Code Phone Number  [place]  [place], [place] [phone]      Imaging Results - documented in this encounter  X-ray chest PA and lateral (01/05/2020 12:04 PM EDT) X-ray chest PA and lateral (01/05/2020 12:04 PM EDT)  Specimen     X-ray chest PA and lateral (01/05/2020 12:04 PM EDT)  Narrative Performed At  This result has an attachment that is not available.        ECG Results - documented in this encounter  ECG 12-lead (01/05/2020 11:32 AM EDT) ECG 12-lead (01/05/2020 11:32 AM EDT)  Component Value Ref Range Performed At Pathologist Signature  Vent Rate (bpm) 73  [facility] GE MUSE RESULTS   QRS Interval (msec) 90  [facility] GE MUSE RESULTS   QT Interval (msec) 388  [facility] GE MUSE RESULTS   QTc (msec) 427  [facility] GE MUSE RESULTS    ECG 12-lead  (01/05/2020 11:32 AM EDT)  Specimen     ECG 12-lead (01/05/2020 11:32 AM EDT)  Narrative Performed At  This result has an attachment that is not available.  Atrial fibrillation with occasional ventricular-paced complexes Left axis deviation RSR' or QR pattern in V1 suggests right ventricular conduction delay Possible Anteroseptal infarct , age undetermined ST and T wave abnormality, consider inferolateral ischemia Abnormal ECG When compared with ECG of 10-Mar-2017 12:05, Vent. rate has increased BY  2 BPM I reviewed and concur with this report. Electronically signed [id number], MD, Cristie Hem [id number]) on 01/10/2020 4:25:00 PM   [place]    ECG 12-lead (01/05/2020 11:32 AM EDT)  Performing Organization Address City/State/ZIP Code Phone Number  [place]        Visit Diagnoses - documented in this encounter Diagnosis  Tachy-brady syndrome (CMS-HCC) - Primary  Sinoatrial node dysfunction   Paroxysmal atrial fibrillation (CMS-HCC)  Atrial fibrillation   Essential hypertension   Nonrheumatic tricuspid valve regurgitation   Chronic obstructive pulmonary disease, unspecified COPD type (CMS-HCC)   Hyperlipidemia, unspecified hyperlipidemia type   SOB (shortness of breath)  Shortness of breath   Pacemaker  Cardiac pacemaker in situ   Preop cardiovascular exam  Pre-operative cardiovascular examination   Images  Patient Contacts  Contact Name Contact Address Communication Relationship to Patient  Armida Sans Unknown [id number] [facility]) Son or Daughter, Emergency Contact  Janetta Hora Unknown [phone] [facility]) Son or Daughter, Emergency Contact  Document Information  Primary Care Provider Other Service Providers Document Coverage Dates  Rober Minion [place], MD (Mar. 23, 2015March 23, 2015 - Present) [phone] (Work) [phone] (Fax) [place], [place] [address] Pulmonary  Disease [facility] [address] [place], [place] [address]  Apr. 15, 2021April 15, 2021   [place] [address] [place], [place] [address]   Encounter Providers Encounter Date  Isaias Cowman, MD (Attending) [phone] (Work) [phone] (Fax) [facility] [place], [place] [address] Cardiovascular Disease Apr. 15, 2021April 15, 2021    Show All Sections

## 2020-01-11 NOTE — Op Note (Signed)
[facility]   01/11/2020                     1:11 PM  PATIENT:  [name] [name]    PRE-OPERATIVE DIAGNOSIS:  pacemaker at ERI-needs change out  POST-OPERATIVE DIAGNOSIS:  Same  PROCEDURE:  PACEMAKER GENERATOR CHANGE OUT  SURGEON:  Isaias Cowman, MD    ANESTHESIA:     PREOPERATIVE INDICATIONS:  [name] [name] is a  86 y.o. female with a diagnosis of pacemaker at ERI-needs change out who failed conservative measures and elected for surgical management.    The risks benefits and alternatives were discussed with the patient preoperatively including but not limited to the risks of infection, bleeding, cardiopulmonary complications, the need for revision surgery, among others, and the patient was willing to proceed.   OPERATIVE PROCEDURE: The patient was brought to the operating room in a fasting state.  The left pectoral region was prepped and draped in the usual sterile manner.  Anesthesia was obtained 1% lidocaine locally.  A 6 cm incision was performed the left pectoral region.  The old pacemaker generator was retrieved electrocautery and blunt dissection.  The pacemaker generator was disconnected from the existing leads.  A new MRI compatible, dual-chamber rate responsive pacemaker generator ([id number] [id number], [id number] [id number]) was connected to the leads.  The pacemmaker pocket was irrigated with gentamicin solution.  The pacemaker generator was positioned into the pocket and the pocket was closed with 2-0 and 4-0 Vicryl, respectively.  Steri-Strips and a pressure dressing were applied.  Postprocedural interrogation of revealed appropriate atrial and ventricular sensing and pacing thresholds.  There were no periprocedural complications.

## 2020-01-11 NOTE — Discharge Instructions (Addendum)
May remove outer bandage 01/12/2020, leave steri-strips on. May shower 01/12/2020. Restart warfarin ( coumadin ) 01/12/2020.    AMBULATORY SURGERY  DISCHARGE INSTRUCTIONS   1) The drugs that you were given will stay in your system until tomorrow so for the next 24 hours you should not:  A) Drive an automobile B) Make any legal decisions C) Drink any alcoholic beverage   2) You may resume regular meals tomorrow.  Today it is better to start with liquids and gradually work up to solid foods.  You may eat anything you prefer, but it is better to start with liquids, then soup and crackers, and gradually work up to solid foods.   3) Please notify your doctor immediately if you have any unusual bleeding, trouble breathing, redness and pain at the surgery site, drainage, fever, or pain not relieved by medication.    4) Additional Instructions:        Please contact your physician with any problems or Same Day Surgery at [phone], Monday through Friday 6 am to 4 pm, or [place] at [facility] number at [phone].

## 2020-01-11 NOTE — Anesthesia Postprocedure Evaluation (Signed)
Anesthesia Post Note  Patient: [name] [name]  Procedure(s) Performed: PACEMAKER GENERATOR CHANGE OUT (Left )  Patient location during evaluation: PACU Anesthesia Type: General Level of consciousness: awake and alert and oriented Pain management: pain level controlled Vital Signs Assessment: post-procedure vital signs reviewed and stable Respiratory status: spontaneous breathing, nonlabored ventilation and respiratory function stable Cardiovascular status: blood pressure returned to baseline and stable Postop Assessment: no signs of nausea or vomiting Anesthetic complications: no     Last Vitals:  Vitals:   01/11/20 1333 01/11/20 1345  BP: 132/71 (!) 143/87  Pulse: 85 84  Resp: (!) 22 18  Temp:  (!) 36 C  SpO2: 96% 100%    Last Pain:  Vitals:   01/11/20 1345  TempSrc: Temporal  PainSc: 0-No pain                 Akosua Constantine

## 2020-01-11 NOTE — Transfer of Care (Signed)
Immediate Anesthesia Transfer of Care Note  Patient: [name] [name]  Procedure(s) Performed: PACEMAKER GENERATOR CHANGE OUT (Left )  Patient Location: PACU  Anesthesia Type:General  Level of Consciousness: awake, alert , oriented and patient cooperative  Airway & Oxygen Therapy: Patient Spontanous Breathing  Post-op Assessment: Report given to RN and Post -op Vital signs reviewed and stable  Post vital signs: Reviewed and stable  Last Vitals:  Vitals Value Taken Time  BP 118/94 01/11/20 1307  Temp    Pulse 101 01/11/20 1310  Resp 22 01/11/20 1311  SpO2 93 % 01/11/20 1310  Vitals shown include unvalidated device data.  Last Pain:  Vitals:   01/11/20 1038  TempSrc: Temporal  PainSc: 0-No pain         Complications: No apparent anesthesia complications

## 2020-01-17 ENCOUNTER — Encounter: Payer: Self-pay | Admitting: *Deleted

## 2020-01-19 DIAGNOSIS — I480 Paroxysmal atrial fibrillation: Secondary | ICD-10-CM | POA: Diagnosis not present

## 2020-01-19 DIAGNOSIS — Z950 Presence of cardiac pacemaker: Secondary | ICD-10-CM | POA: Diagnosis not present

## 2020-01-19 DIAGNOSIS — I38 Endocarditis, valve unspecified: Secondary | ICD-10-CM | POA: Diagnosis not present

## 2020-01-19 DIAGNOSIS — R001 Bradycardia, unspecified: Secondary | ICD-10-CM | POA: Diagnosis not present

## 2020-01-19 DIAGNOSIS — E785 Hyperlipidemia, unspecified: Secondary | ICD-10-CM | POA: Diagnosis not present

## 2020-01-19 DIAGNOSIS — Z9889 Other specified postprocedural states: Secondary | ICD-10-CM | POA: Diagnosis not present

## 2020-01-19 DIAGNOSIS — I495 Sick sinus syndrome: Secondary | ICD-10-CM | POA: Diagnosis not present

## 2020-01-19 DIAGNOSIS — I10 Essential (primary) hypertension: Secondary | ICD-10-CM | POA: Diagnosis not present

## 2020-01-19 DIAGNOSIS — R0602 Shortness of breath: Secondary | ICD-10-CM | POA: Diagnosis not present

## 2020-01-19 DIAGNOSIS — J449 Chronic obstructive pulmonary disease, unspecified: Secondary | ICD-10-CM | POA: Diagnosis not present

## 2020-01-23 DIAGNOSIS — L97921 Non-pressure chronic ulcer of unspecified part of left lower leg limited to breakdown of skin: Secondary | ICD-10-CM | POA: Diagnosis not present

## 2020-01-23 DIAGNOSIS — L97911 Non-pressure chronic ulcer of unspecified part of right lower leg limited to breakdown of skin: Secondary | ICD-10-CM | POA: Diagnosis not present

## 2020-01-23 DIAGNOSIS — I872 Venous insufficiency (chronic) (peripheral): Secondary | ICD-10-CM | POA: Diagnosis not present

## 2020-01-30 DIAGNOSIS — R000 Tachycardia, unspecified: Secondary | ICD-10-CM | POA: Diagnosis not present

## 2020-02-02 DIAGNOSIS — I480 Paroxysmal atrial fibrillation: Secondary | ICD-10-CM | POA: Diagnosis not present

## 2020-02-07 DIAGNOSIS — I10 Essential (primary) hypertension: Secondary | ICD-10-CM | POA: Diagnosis not present

## 2020-02-07 DIAGNOSIS — I38 Endocarditis, valve unspecified: Secondary | ICD-10-CM | POA: Diagnosis not present

## 2020-02-07 DIAGNOSIS — L270 Generalized skin eruption due to drugs and medicaments taken internally: Secondary | ICD-10-CM | POA: Diagnosis not present

## 2020-02-07 DIAGNOSIS — J440 Chronic obstructive pulmonary disease with acute lower respiratory infection: Secondary | ICD-10-CM | POA: Diagnosis not present

## 2020-02-07 DIAGNOSIS — R0602 Shortness of breath: Secondary | ICD-10-CM | POA: Diagnosis not present

## 2020-02-07 DIAGNOSIS — I480 Paroxysmal atrial fibrillation: Secondary | ICD-10-CM | POA: Diagnosis not present

## 2020-02-07 DIAGNOSIS — Z950 Presence of cardiac pacemaker: Secondary | ICD-10-CM | POA: Diagnosis not present

## 2020-03-01 DIAGNOSIS — I480 Paroxysmal atrial fibrillation: Secondary | ICD-10-CM | POA: Diagnosis not present

## 2020-03-29 DIAGNOSIS — I480 Paroxysmal atrial fibrillation: Secondary | ICD-10-CM | POA: Diagnosis not present

## 2020-04-16 DIAGNOSIS — R339 Retention of urine, unspecified: Secondary | ICD-10-CM | POA: Diagnosis not present

## 2020-04-16 DIAGNOSIS — M545 Low back pain: Secondary | ICD-10-CM | POA: Diagnosis not present

## 2020-04-16 DIAGNOSIS — R34 Anuria and oliguria: Secondary | ICD-10-CM | POA: Diagnosis not present

## 2020-04-18 DIAGNOSIS — L309 Dermatitis, unspecified: Secondary | ICD-10-CM | POA: Diagnosis not present

## 2020-04-18 DIAGNOSIS — Z7901 Long term (current) use of anticoagulants: Secondary | ICD-10-CM | POA: Diagnosis not present

## 2020-04-18 DIAGNOSIS — S80822A Blister (nonthermal), left lower leg, initial encounter: Secondary | ICD-10-CM | POA: Diagnosis not present

## 2020-04-19 DIAGNOSIS — J440 Chronic obstructive pulmonary disease with acute lower respiratory infection: Secondary | ICD-10-CM | POA: Diagnosis not present

## 2020-04-19 DIAGNOSIS — R0602 Shortness of breath: Secondary | ICD-10-CM | POA: Diagnosis not present

## 2020-04-19 DIAGNOSIS — E785 Hyperlipidemia, unspecified: Secondary | ICD-10-CM | POA: Diagnosis not present

## 2020-04-19 DIAGNOSIS — R001 Bradycardia, unspecified: Secondary | ICD-10-CM | POA: Diagnosis not present

## 2020-04-19 DIAGNOSIS — Z950 Presence of cardiac pacemaker: Secondary | ICD-10-CM | POA: Diagnosis not present

## 2020-04-19 DIAGNOSIS — I495 Sick sinus syndrome: Secondary | ICD-10-CM | POA: Diagnosis not present

## 2020-04-19 DIAGNOSIS — I10 Essential (primary) hypertension: Secondary | ICD-10-CM | POA: Diagnosis not present

## 2020-04-19 DIAGNOSIS — I480 Paroxysmal atrial fibrillation: Secondary | ICD-10-CM | POA: Diagnosis not present

## 2020-04-23 DIAGNOSIS — I10 Essential (primary) hypertension: Secondary | ICD-10-CM | POA: Diagnosis not present

## 2020-04-23 DIAGNOSIS — L03116 Cellulitis of left lower limb: Secondary | ICD-10-CM | POA: Diagnosis not present

## 2020-04-23 DIAGNOSIS — R209 Unspecified disturbances of skin sensation: Secondary | ICD-10-CM | POA: Diagnosis not present

## 2020-04-23 DIAGNOSIS — I480 Paroxysmal atrial fibrillation: Secondary | ICD-10-CM | POA: Diagnosis not present

## 2020-04-23 DIAGNOSIS — S80822A Blister (nonthermal), left lower leg, initial encounter: Secondary | ICD-10-CM | POA: Diagnosis not present

## 2020-04-23 DIAGNOSIS — R600 Localized edema: Secondary | ICD-10-CM | POA: Diagnosis not present

## 2020-04-26 DIAGNOSIS — I480 Paroxysmal atrial fibrillation: Secondary | ICD-10-CM | POA: Diagnosis not present

## 2020-04-30 DIAGNOSIS — R600 Localized edema: Secondary | ICD-10-CM | POA: Diagnosis not present

## 2020-04-30 DIAGNOSIS — I872 Venous insufficiency (chronic) (peripheral): Secondary | ICD-10-CM | POA: Diagnosis not present

## 2020-05-03 ENCOUNTER — Encounter: Payer: Self-pay | Admitting: Family

## 2020-05-03 ENCOUNTER — Other Ambulatory Visit: Payer: Self-pay

## 2020-05-03 ENCOUNTER — Ambulatory Visit: Payer: PPO | Attending: Family | Admitting: Family

## 2020-05-03 VITALS — BP 132/87 | HR 103 | Resp 16 | Ht 62.0 in | Wt 171.4 lb

## 2020-05-03 DIAGNOSIS — Z7901 Long term (current) use of anticoagulants: Secondary | ICD-10-CM | POA: Diagnosis not present

## 2020-05-03 DIAGNOSIS — I739 Peripheral vascular disease, unspecified: Secondary | ICD-10-CM | POA: Insufficient documentation

## 2020-05-03 DIAGNOSIS — Z79899 Other long term (current) drug therapy: Secondary | ICD-10-CM | POA: Diagnosis not present

## 2020-05-03 DIAGNOSIS — E785 Hyperlipidemia, unspecified: Secondary | ICD-10-CM | POA: Insufficient documentation

## 2020-05-03 DIAGNOSIS — I10 Essential (primary) hypertension: Secondary | ICD-10-CM

## 2020-05-03 DIAGNOSIS — I890 Lymphedema, not elsewhere classified: Secondary | ICD-10-CM

## 2020-05-03 DIAGNOSIS — F419 Anxiety disorder, unspecified: Secondary | ICD-10-CM | POA: Insufficient documentation

## 2020-05-03 DIAGNOSIS — I255 Ischemic cardiomyopathy: Secondary | ICD-10-CM | POA: Diagnosis not present

## 2020-05-03 DIAGNOSIS — Z87891 Personal history of nicotine dependence: Secondary | ICD-10-CM | POA: Diagnosis not present

## 2020-05-03 DIAGNOSIS — J449 Chronic obstructive pulmonary disease, unspecified: Secondary | ICD-10-CM | POA: Insufficient documentation

## 2020-05-03 DIAGNOSIS — G40909 Epilepsy, unspecified, not intractable, without status epilepticus: Secondary | ICD-10-CM | POA: Insufficient documentation

## 2020-05-03 DIAGNOSIS — I110 Hypertensive heart disease with heart failure: Secondary | ICD-10-CM | POA: Diagnosis not present

## 2020-05-03 DIAGNOSIS — Z8249 Family history of ischemic heart disease and other diseases of the circulatory system: Secondary | ICD-10-CM | POA: Diagnosis not present

## 2020-05-03 DIAGNOSIS — Z86718 Personal history of other venous thrombosis and embolism: Secondary | ICD-10-CM | POA: Insufficient documentation

## 2020-05-03 DIAGNOSIS — I2510 Atherosclerotic heart disease of native coronary artery without angina pectoris: Secondary | ICD-10-CM | POA: Insufficient documentation

## 2020-05-03 DIAGNOSIS — Z950 Presence of cardiac pacemaker: Secondary | ICD-10-CM | POA: Diagnosis not present

## 2020-05-03 DIAGNOSIS — I5032 Chronic diastolic (congestive) heart failure: Secondary | ICD-10-CM | POA: Diagnosis not present

## 2020-05-03 DIAGNOSIS — Z7951 Long term (current) use of inhaled steroids: Secondary | ICD-10-CM | POA: Diagnosis not present

## 2020-05-03 NOTE — Progress Notes (Signed)
Patient ID: [name] [name], female    DOB: [birth year], 86 y.o.   MRN: [id number]  HPI  [name] [name] is a 86 y/o female with a history of atrial fibrillation, HTN, COPD, asthma, PAD, seizure disorder, DVT, hyperlipidemia, anemia, previous tobacco use, sick sinus syndrome (pacemaker) and chronic heart failure.   Echo report from 10/12/19 reviewed and showed an EF of 55% along with severe TR and mild LVH.  Has not been admitted or been in the ED in the last 6 months.   She presents today for her initial visit with a chief complaint of moderate shortness of breath with little exertion. She describes this as chronic in nature having been present for several years. She has associated fatigue, decreased appetite, cough, chest pain (stress induced), pedal edema, (improving), light-headedness, easy bruising, anxiety and difficulty sleeping along with this. She denies any abdominal distention, palpitations or wheezing.   Does have scales at home but isn't weighing herself daily. Has been wearing compression socks daily for the last 3 days and notes improvement in swelling. Continues to have some weeping of fluids from both legs.   Past Medical History:  Diagnosis Date  . Anemia   . Arrhythmia    atrial fibrillation  . Asthma   . Breast cancer ([place]) 1978   left  . CAD (coronary artery disease)   . COPD (chronic obstructive pulmonary disease) ([place])   . DVT (deep venous thrombosis) ([place])   . Hemorrhoids   . History of knee replacement, total   . Hyperlipidemia   . Hypertension   . Ischemic cardiomyopathy   . Peripheral arterial disease ([place])   . Seizure disorder ([place])   . Sick sinus syndrome [facility])    Past Surgical History:  Procedure Laterality Date  . ABDOMINAL HYSTERECTOMY  1985  . HEMORRHOID SURGERY    . ILIAC ARTERY STENT    . INTRAOPERATIVE ARTERIOGRAM Right   . MASTECTOMY Left 1978  . PACEMAKER INSERTION Left 01/11/2020   Procedure: PACEMAKER GENERATOR CHANGE OUT;  Surgeon: Isaias Cowman, MD;  Location: [facility] ORS;  Service: Cardiovascular;  Laterality: Left;  . REDUCTION MAMMAPLASTY Right 1985   impant placed then removed   Family History  Problem Relation Age of Onset  . Hypertension Father   . Heart disease Father   . Clotting disorder Father   . Cancer Mother        breast  . Heart disease Sister   . Thyroid disease Sister   . Diabetes Sister   . Breast cancer Daughter        70's, twice diagnosed, double mastectomy  . Breast cancer Daughter        70's   Social History   Tobacco Use  . Smoking status: Former Smoker    Types: Cigarettes    Quit date: 12/05/1986    Years since quitting: 33.4  . Smokeless tobacco: Never Used  Substance Use Topics  . Alcohol use: No    Alcohol/week: 0.0 standard drinks   Allergies  Allergen Reactions  . Penicillins Anaphylaxis  . Beta Adrenergic Blockers   . Meloxicam     Other reaction(s): Unknown  . Naproxen     Other reaction(s): Unknown  . Nsaids    Prior to Admission medications   Medication Sig Start Date End Date Taking? Authorizing Provider  albuterol (PROVENTIL HFA;VENTOLIN HFA) 108 (90 BASE) MCG/ACT inhaler Inhale 2 puffs into the lungs every 6 (six) hours as needed for wheezing or shortness of breath.  Yes [provider]  alendronate (FOSAMAX) 70 MG tablet Take 70 mg by mouth once a week. Take with a full glass of water on an empty stomach.   Yes [provider]  atorvastatin (LIPITOR) 40 MG tablet Take 40 mg by mouth every evening.   Yes [provider]  budesonide-formoterol (SYMBICORT) 160-4.5 MCG/ACT inhaler Inhale 2 puffs into the lungs 2 (two) times daily. 07/27/18  Yes Wilhelmina Mcardle, MD  diltiazem (DILTIAZEM CD) 180 MG 24 hr capsule Take 180 mg by mouth daily.   Yes [provider]  furosemide (LASIX) 20 MG tablet Take 2 tablets (40 mg total) by mouth 2 (two) times daily. Take a second dose at 1-2 PM Patient taking differently: Take 40 mg by mouth  daily.  07/27/18  Yes Wilhelmina Mcardle, MD  ipratropium-albuterol (DUONEB) 0.5-2.5 (3) MG/3ML SOLN Take 3 mLs by nebulization every 4 (four) hours as needed.   Yes [provider]  metolazone (ZAROXOLYN) 2.5 MG tablet Take 2.5 mg by mouth as directed. Pt takes every other Monday. 09/21/18  Yes [provider]  metoprolol succinate (TOPROL-XL) 100 MG 24 hr tablet Take 50 mg by mouth daily. Take with or immediately following a meal.    Yes [provider]  omeprazole (PRILOSEC) 40 MG capsule Take 40 mg by mouth daily.   Yes [provider]  oxybutynin (DITROPAN) 5 MG tablet Take 5 mg by mouth 3 (three) times daily. 01/06/17  Yes [provider]  potassium chloride (KLOR-CON M10) 10 MEQ tablet Take 1 tablet (10 mEq total) by mouth daily. Patient taking differently: Take 20 mEq by mouth daily.  08/16/18  Yes Wilhelmina Mcardle, MD  quinapril (ACCUPRIL) 40 MG tablet Take 40 mg by mouth daily.   Yes [provider]  tiotropium (SPIRIVA) 18 MCG inhalation capsule Place 18 mcg into inhaler and inhale daily.   Yes [provider]  warfarin (COUMADIN) 5 MG tablet Take 5 mg by mouth daily.   Yes [provider]    Review of Systems  Constitutional: Positive for appetite change (decreased) and fatigue.  HENT: Negative for congestion, postnasal drip and sore throat.   Eyes: Negative.   Respiratory: Positive for cough and shortness of breath (with little exertion).   Cardiovascular: Positive for chest pain (with stress due to death of dog) and leg swelling. Negative for palpitations.  Gastrointestinal: Negative for abdominal distention and abdominal pain.  Endocrine: Negative.   Genitourinary: Negative.   Musculoskeletal: Positive for arthralgias (left knee) and back pain.  Skin: Positive for wound (lower legs).  Allergic/Immunologic: Negative.   Neurological: Positive for light-headedness. Negative for dizziness.  Hematological:  Negative for adenopathy. Bruises/bleeds easily.  Psychiatric/Behavioral: Positive for sleep disturbance (chronic difficulty). Negative for dysphoric mood. The patient is nervous/anxious.       Vitals:   05/03/20 1201  BP: 132/87  Pulse: (!) 103  Resp: 16  SpO2: 100%  Weight: 171 lb 6 oz (77.7 kg)  Height: 5\' 2"  (1.575 m)   Wt Readings from Last 3 Encounters:  05/03/20 171 lb 6 oz (77.7 kg)  01/11/20 183 lb (83 kg)  11/18/18 178 lb (80.7 kg)   Lab Results  Component Value Date   CREATININE 0.95 08/10/2018   CREATININE 1.17 (H) 07/31/2017   CREATININE 1.01 (H) 09/10/2015    Physical Exam Vitals and nursing note reviewed.  Constitutional:      Appearance: She is well-developed.  HENT:     Head: Normocephalic  and atraumatic.  Neck:     Vascular: No JVD.  Cardiovascular:     Rate and Rhythm: Regular rhythm. Tachycardia present.  Pulmonary:     Effort: Pulmonary effort is normal. No respiratory distress.     Breath sounds: Examination of the right-upper field reveals wheezing. Examination of the left-upper field reveals wheezing. Wheezing present. No rales.  Abdominal:     Palpations: Abdomen is soft.     Tenderness: There is no abdominal tenderness.  Musculoskeletal:     Cervical back: Neck supple.     Right lower leg: No tenderness. Edema (trace pitting) present.     Left lower leg: Edema (trace pitting) present.  Skin:    General: Skin is warm.     Comments: Both lower legs wrapped in gauze  Neurological:     General: No focal deficit present.     Mental Status: She is alert and oriented to person, place, and time.  Psychiatric:        Mood and Affect: Mood normal.        Behavior: Behavior normal.    Assessment & Plan:  1: Chronic heart failure with preserved ejection fraction with structural changes- - NYHA class III  - euvolemic today - instructed to begin weighing daily and call for an overnight weight gain of >2 pounds or a weekly weight gain of >5  pounds - says that she adds "a pinch" of salt to her food; tries to eat a low sodium diet; written dietary information along with a low sodium cookbook were given to her - saw cardiology ([facility]) 04/19/20 - reports receiving both COVID vaccines  2: HTN- - BP looks good today - saw PCP ([facility]) 04/23/20 - CMET from 04/16/20 reviewed and showed sodium 141, potassium 3.9, creatinine 1.0 and GFR 52   3: COPD- - has inhalers / nebulizer that she uses - has seen [facility] pulmonology in the past  4: Lymphedema- - stage 2 - doesn't elevate her legs much and she was encouraged to elevate her legs when sitting for long periods of time - limited in her ability to exercise due to symptoms - has been wearing compression socks daily for the last 3 days and reports improvement in her swelling - has ABI scheduled next month   Medication bottles reviewed.   Due to HF stability, patient opts to not make a return appointment at this time. Advised patient and her daughter that she could call back at anytime to schedule another appointment. She was comfortable with this plan.

## 2020-05-03 NOTE — Patient Instructions (Addendum)
Continue weighing daily and call for an overnight weight gain of > 2 pounds or a weekly weight gain of >5 pounds.   Call us in the future if you'd like to schedule another appointment 

## 2020-05-04 DIAGNOSIS — J679 Hypersensitivity pneumonitis due to unspecified organic dust: Secondary | ICD-10-CM | POA: Diagnosis not present

## 2020-05-04 DIAGNOSIS — I480 Paroxysmal atrial fibrillation: Secondary | ICD-10-CM | POA: Diagnosis not present

## 2020-05-04 DIAGNOSIS — I10 Essential (primary) hypertension: Secondary | ICD-10-CM | POA: Diagnosis not present

## 2020-05-04 DIAGNOSIS — I38 Endocarditis, valve unspecified: Secondary | ICD-10-CM | POA: Diagnosis not present

## 2020-05-04 DIAGNOSIS — R739 Hyperglycemia, unspecified: Secondary | ICD-10-CM | POA: Diagnosis not present

## 2020-05-04 DIAGNOSIS — Z950 Presence of cardiac pacemaker: Secondary | ICD-10-CM | POA: Diagnosis not present

## 2020-05-04 DIAGNOSIS — J449 Chronic obstructive pulmonary disease, unspecified: Secondary | ICD-10-CM | POA: Diagnosis not present

## 2020-05-11 DIAGNOSIS — J679 Hypersensitivity pneumonitis due to unspecified organic dust: Secondary | ICD-10-CM | POA: Diagnosis not present

## 2020-05-11 DIAGNOSIS — E876 Hypokalemia: Secondary | ICD-10-CM | POA: Diagnosis not present

## 2020-05-11 DIAGNOSIS — I878 Other specified disorders of veins: Secondary | ICD-10-CM | POA: Diagnosis not present

## 2020-05-11 DIAGNOSIS — Z950 Presence of cardiac pacemaker: Secondary | ICD-10-CM | POA: Diagnosis not present

## 2020-05-11 DIAGNOSIS — R609 Edema, unspecified: Secondary | ICD-10-CM | POA: Diagnosis not present

## 2020-05-11 DIAGNOSIS — Z0000 Encounter for general adult medical examination without abnormal findings: Secondary | ICD-10-CM | POA: Diagnosis not present

## 2020-05-11 DIAGNOSIS — I10 Essential (primary) hypertension: Secondary | ICD-10-CM | POA: Diagnosis not present

## 2020-05-11 DIAGNOSIS — J440 Chronic obstructive pulmonary disease with acute lower respiratory infection: Secondary | ICD-10-CM | POA: Diagnosis not present

## 2020-05-11 DIAGNOSIS — I480 Paroxysmal atrial fibrillation: Secondary | ICD-10-CM | POA: Diagnosis not present

## 2020-05-11 DIAGNOSIS — I495 Sick sinus syndrome: Secondary | ICD-10-CM | POA: Diagnosis not present

## 2020-05-24 DIAGNOSIS — I480 Paroxysmal atrial fibrillation: Secondary | ICD-10-CM | POA: Diagnosis not present

## 2020-05-30 DIAGNOSIS — R600 Localized edema: Secondary | ICD-10-CM | POA: Diagnosis not present

## 2020-05-30 DIAGNOSIS — R209 Unspecified disturbances of skin sensation: Secondary | ICD-10-CM | POA: Diagnosis not present

## 2020-06-06 DIAGNOSIS — R399 Unspecified symptoms and signs involving the genitourinary system: Secondary | ICD-10-CM | POA: Diagnosis not present

## 2020-06-06 DIAGNOSIS — I480 Paroxysmal atrial fibrillation: Secondary | ICD-10-CM | POA: Diagnosis not present

## 2020-06-06 DIAGNOSIS — E876 Hypokalemia: Secondary | ICD-10-CM | POA: Diagnosis not present

## 2020-06-06 DIAGNOSIS — I878 Other specified disorders of veins: Secondary | ICD-10-CM | POA: Diagnosis not present

## 2020-06-06 DIAGNOSIS — I495 Sick sinus syndrome: Secondary | ICD-10-CM | POA: Diagnosis not present

## 2020-06-06 DIAGNOSIS — R8290 Unspecified abnormal findings in urine: Secondary | ICD-10-CM | POA: Diagnosis not present

## 2020-06-06 DIAGNOSIS — I10 Essential (primary) hypertension: Secondary | ICD-10-CM | POA: Diagnosis not present

## 2020-06-06 DIAGNOSIS — Z950 Presence of cardiac pacemaker: Secondary | ICD-10-CM | POA: Diagnosis not present

## 2020-06-06 DIAGNOSIS — J440 Chronic obstructive pulmonary disease with acute lower respiratory infection: Secondary | ICD-10-CM | POA: Diagnosis not present

## 2020-06-18 DIAGNOSIS — L84 Corns and callosities: Secondary | ICD-10-CM | POA: Diagnosis not present

## 2020-06-18 DIAGNOSIS — B353 Tinea pedis: Secondary | ICD-10-CM | POA: Diagnosis not present

## 2020-06-18 DIAGNOSIS — M2041 Other hammer toe(s) (acquired), right foot: Secondary | ICD-10-CM | POA: Diagnosis not present

## 2020-06-18 DIAGNOSIS — G629 Polyneuropathy, unspecified: Secondary | ICD-10-CM | POA: Diagnosis not present

## 2020-06-18 DIAGNOSIS — M2042 Other hammer toe(s) (acquired), left foot: Secondary | ICD-10-CM | POA: Diagnosis not present

## 2020-06-18 DIAGNOSIS — I83029 Varicose veins of left lower extremity with ulcer of unspecified site: Secondary | ICD-10-CM | POA: Diagnosis not present

## 2020-06-18 DIAGNOSIS — I872 Venous insufficiency (chronic) (peripheral): Secondary | ICD-10-CM | POA: Diagnosis not present

## 2020-06-18 DIAGNOSIS — I739 Peripheral vascular disease, unspecified: Secondary | ICD-10-CM | POA: Diagnosis not present

## 2020-06-18 DIAGNOSIS — B351 Tinea unguium: Secondary | ICD-10-CM | POA: Diagnosis not present

## 2020-06-18 DIAGNOSIS — L97929 Non-pressure chronic ulcer of unspecified part of left lower leg with unspecified severity: Secondary | ICD-10-CM | POA: Diagnosis not present

## 2020-06-28 DIAGNOSIS — I480 Paroxysmal atrial fibrillation: Secondary | ICD-10-CM | POA: Diagnosis not present

## 2020-07-16 DIAGNOSIS — G629 Polyneuropathy, unspecified: Secondary | ICD-10-CM | POA: Diagnosis not present

## 2020-07-16 DIAGNOSIS — B353 Tinea pedis: Secondary | ICD-10-CM | POA: Diagnosis not present

## 2020-07-16 DIAGNOSIS — B351 Tinea unguium: Secondary | ICD-10-CM | POA: Diagnosis not present

## 2020-07-16 DIAGNOSIS — L988 Other specified disorders of the skin and subcutaneous tissue: Secondary | ICD-10-CM | POA: Diagnosis not present

## 2020-07-16 DIAGNOSIS — I872 Venous insufficiency (chronic) (peripheral): Secondary | ICD-10-CM | POA: Diagnosis not present

## 2020-07-16 DIAGNOSIS — I739 Peripheral vascular disease, unspecified: Secondary | ICD-10-CM | POA: Diagnosis not present

## 2020-07-26 DIAGNOSIS — I480 Paroxysmal atrial fibrillation: Secondary | ICD-10-CM | POA: Diagnosis not present

## 2020-08-07 DIAGNOSIS — I495 Sick sinus syndrome: Secondary | ICD-10-CM | POA: Diagnosis not present

## 2020-08-13 DIAGNOSIS — B353 Tinea pedis: Secondary | ICD-10-CM | POA: Diagnosis not present

## 2020-08-13 DIAGNOSIS — B372 Candidiasis of skin and nail: Secondary | ICD-10-CM | POA: Diagnosis not present

## 2020-08-15 DIAGNOSIS — I495 Sick sinus syndrome: Secondary | ICD-10-CM | POA: Diagnosis not present

## 2020-08-15 DIAGNOSIS — I10 Essential (primary) hypertension: Secondary | ICD-10-CM | POA: Diagnosis not present

## 2020-08-15 DIAGNOSIS — I480 Paroxysmal atrial fibrillation: Secondary | ICD-10-CM | POA: Diagnosis not present

## 2020-08-15 DIAGNOSIS — J679 Hypersensitivity pneumonitis due to unspecified organic dust: Secondary | ICD-10-CM | POA: Diagnosis not present

## 2020-08-15 DIAGNOSIS — R609 Edema, unspecified: Secondary | ICD-10-CM | POA: Diagnosis not present

## 2020-08-15 DIAGNOSIS — J440 Chronic obstructive pulmonary disease with acute lower respiratory infection: Secondary | ICD-10-CM | POA: Diagnosis not present

## 2020-08-15 DIAGNOSIS — I878 Other specified disorders of veins: Secondary | ICD-10-CM | POA: Diagnosis not present

## 2020-08-15 DIAGNOSIS — Z950 Presence of cardiac pacemaker: Secondary | ICD-10-CM | POA: Diagnosis not present

## 2020-08-22 DIAGNOSIS — J440 Chronic obstructive pulmonary disease with acute lower respiratory infection: Secondary | ICD-10-CM | POA: Diagnosis not present

## 2020-08-22 DIAGNOSIS — R609 Edema, unspecified: Secondary | ICD-10-CM | POA: Diagnosis not present

## 2020-08-22 DIAGNOSIS — I517 Cardiomegaly: Secondary | ICD-10-CM | POA: Diagnosis not present

## 2020-08-22 DIAGNOSIS — I700 Atherosclerosis of aorta: Secondary | ICD-10-CM | POA: Diagnosis not present

## 2020-08-22 DIAGNOSIS — Z23 Encounter for immunization: Secondary | ICD-10-CM | POA: Diagnosis not present

## 2020-08-22 DIAGNOSIS — I10 Essential (primary) hypertension: Secondary | ICD-10-CM | POA: Diagnosis not present

## 2020-08-22 DIAGNOSIS — E875 Hyperkalemia: Secondary | ICD-10-CM | POA: Diagnosis not present

## 2020-08-22 DIAGNOSIS — J90 Pleural effusion, not elsewhere classified: Secondary | ICD-10-CM | POA: Diagnosis not present

## 2020-08-22 DIAGNOSIS — I5032 Chronic diastolic (congestive) heart failure: Secondary | ICD-10-CM | POA: Diagnosis not present

## 2020-08-22 DIAGNOSIS — I361 Nonrheumatic tricuspid (valve) insufficiency: Secondary | ICD-10-CM | POA: Diagnosis not present

## 2020-08-22 DIAGNOSIS — R0602 Shortness of breath: Secondary | ICD-10-CM | POA: Diagnosis not present

## 2020-08-22 DIAGNOSIS — J679 Hypersensitivity pneumonitis due to unspecified organic dust: Secondary | ICD-10-CM | POA: Diagnosis not present

## 2020-08-22 DIAGNOSIS — J209 Acute bronchitis, unspecified: Secondary | ICD-10-CM | POA: Diagnosis not present

## 2020-08-22 DIAGNOSIS — I38 Endocarditis, valve unspecified: Secondary | ICD-10-CM | POA: Diagnosis not present

## 2020-08-22 DIAGNOSIS — Z950 Presence of cardiac pacemaker: Secondary | ICD-10-CM | POA: Diagnosis not present

## 2020-08-22 DIAGNOSIS — I480 Paroxysmal atrial fibrillation: Secondary | ICD-10-CM | POA: Diagnosis not present

## 2020-08-22 DIAGNOSIS — I495 Sick sinus syndrome: Secondary | ICD-10-CM | POA: Diagnosis not present

## 2020-08-22 DIAGNOSIS — I878 Other specified disorders of veins: Secondary | ICD-10-CM | POA: Diagnosis not present

## 2020-08-28 DIAGNOSIS — Z961 Presence of intraocular lens: Secondary | ICD-10-CM | POA: Diagnosis not present

## 2020-08-28 DIAGNOSIS — H52203 Unspecified astigmatism, bilateral: Secondary | ICD-10-CM | POA: Diagnosis not present

## 2020-08-29 ENCOUNTER — Encounter: Payer: Self-pay | Admitting: Intensive Care

## 2020-08-29 ENCOUNTER — Emergency Department: Payer: PPO

## 2020-08-29 ENCOUNTER — Emergency Department
Admission: EM | Admit: 2020-08-29 | Discharge: 2020-08-29 | Disposition: A | Discharge status: Home / Self Care | Payer: PPO | Attending: Emergency Medicine | Admitting: Emergency Medicine

## 2020-08-29 ENCOUNTER — Other Ambulatory Visit: Payer: Self-pay

## 2020-08-29 DIAGNOSIS — Z87891 Personal history of nicotine dependence: Secondary | ICD-10-CM | POA: Insufficient documentation

## 2020-08-29 DIAGNOSIS — R0602 Shortness of breath: Secondary | ICD-10-CM | POA: Diagnosis not present

## 2020-08-29 DIAGNOSIS — I110 Hypertensive heart disease with heart failure: Secondary | ICD-10-CM | POA: Diagnosis not present

## 2020-08-29 DIAGNOSIS — J3489 Other specified disorders of nose and nasal sinuses: Secondary | ICD-10-CM | POA: Diagnosis not present

## 2020-08-29 DIAGNOSIS — Z96659 Presence of unspecified artificial knee joint: Secondary | ICD-10-CM | POA: Diagnosis not present

## 2020-08-29 DIAGNOSIS — J45909 Unspecified asthma, uncomplicated: Secondary | ICD-10-CM | POA: Diagnosis not present

## 2020-08-29 DIAGNOSIS — Z853 Personal history of malignant neoplasm of breast: Secondary | ICD-10-CM | POA: Insufficient documentation

## 2020-08-29 DIAGNOSIS — Z950 Presence of cardiac pacemaker: Secondary | ICD-10-CM | POA: Insufficient documentation

## 2020-08-29 DIAGNOSIS — Z79899 Other long term (current) drug therapy: Secondary | ICD-10-CM | POA: Insufficient documentation

## 2020-08-29 DIAGNOSIS — R918 Other nonspecific abnormal finding of lung field: Secondary | ICD-10-CM | POA: Diagnosis not present

## 2020-08-29 DIAGNOSIS — J441 Chronic obstructive pulmonary disease with (acute) exacerbation: Secondary | ICD-10-CM | POA: Insufficient documentation

## 2020-08-29 DIAGNOSIS — J90 Pleural effusion, not elsewhere classified: Secondary | ICD-10-CM | POA: Diagnosis not present

## 2020-08-29 DIAGNOSIS — Z20822 Contact with and (suspected) exposure to covid-19: Secondary | ICD-10-CM | POA: Diagnosis not present

## 2020-08-29 DIAGNOSIS — Z7901 Long term (current) use of anticoagulants: Secondary | ICD-10-CM | POA: Diagnosis not present

## 2020-08-29 DIAGNOSIS — I2510 Atherosclerotic heart disease of native coronary artery without angina pectoris: Secondary | ICD-10-CM | POA: Insufficient documentation

## 2020-08-29 DIAGNOSIS — I509 Heart failure, unspecified: Secondary | ICD-10-CM | POA: Diagnosis not present

## 2020-08-29 DIAGNOSIS — J449 Chronic obstructive pulmonary disease, unspecified: Secondary | ICD-10-CM | POA: Diagnosis not present

## 2020-08-29 HISTORY — DX: Heart failure, unspecified: I50.9

## 2020-08-29 HISTORY — DX: Esophageal obstruction: K22.2

## 2020-08-29 HISTORY — DX: Paroxysmal atrial fibrillation: I48.0

## 2020-08-29 HISTORY — DX: Age-related osteoporosis without current pathological fracture: M81.0

## 2020-08-29 LAB — BASIC METABOLIC PANEL
Anion gap: 13 (ref 5–15)
BUN: 27 mg/dL — ABNORMAL HIGH (ref 8–23)
CO2: 25 mmol/L (ref 22–32)
Calcium: 9.2 mg/dL (ref 8.9–10.3)
Chloride: 104 mmol/L (ref 98–111)
Creatinine, Ser: 1.19 mg/dL — ABNORMAL HIGH (ref 0.44–1.00)
GFR, Estimated: 43 mL/min — ABNORMAL LOW (ref >60)
Glucose, Bld: 94 mg/dL (ref 70–99)
Potassium: 4.4 mmol/L (ref 3.5–5.1)
Sodium: 142 mmol/L (ref 135–145)

## 2020-08-29 LAB — CBC
HCT: 46 % (ref 36.0–46.0)
Hemoglobin: 14.6 g/dL (ref 12.0–15.0)
MCH: 32.1 pg (ref 26.0–34.0)
MCHC: 31.7 g/dL (ref 30.0–36.0)
MCV: 101.1 fL — ABNORMAL HIGH (ref 80.0–100.0)
Platelets: 141 10*3/uL — ABNORMAL LOW (ref 150–400)
RBC: 4.55 MIL/uL (ref 3.87–5.11)
RDW: 14.6 % (ref 11.5–15.5)
WBC: 8 10*3/uL (ref 4.0–10.5)
nRBC: 0 % (ref 0.0–0.2)

## 2020-08-29 LAB — PROTIME-INR
INR: 1.8 — ABNORMAL HIGH (ref 0.8–1.2)
Prothrombin Time: 20.1 seconds — ABNORMAL HIGH (ref 11.4–15.2)

## 2020-08-29 LAB — RESP PANEL BY RT-PCR (FLU A&B, COVID) ARPGX2
Influenza A by PCR: NEGATIVE
Influenza B by PCR: NEGATIVE
SARS Coronavirus 2 by RT PCR: NEGATIVE

## 2020-08-29 LAB — TROPONIN I (HIGH SENSITIVITY): Troponin I (High Sensitivity): 14 ng/L (ref <18)

## 2020-08-29 MED ORDER — METHYLPREDNISOLONE SODIUM SUCC 125 MG IJ SOLR
125.0000 mg | Freq: Once | INTRAMUSCULAR | Status: AC
  Administered 2020-08-29: 125 mg via INTRAVENOUS
  Filled 2020-08-29: qty 2

## 2020-08-29 MED ORDER — PREDNISONE 20 MG PO TABS: 60.0000 mg | ORAL_TABLET | Freq: Every day | ORAL | 0 refills | Status: AC

## 2020-08-29 MED ORDER — AZITHROMYCIN 250 MG PO TABS: ORAL_TABLET | ORAL | 0 refills | Status: AC

## 2020-08-29 MED ORDER — IPRATROPIUM-ALBUTEROL 0.5-2.5 (3) MG/3ML IN SOLN
3.0000 mL | Freq: Once | RESPIRATORY_TRACT | Status: AC
  Administered 2020-08-29: 3 mL via RESPIRATORY_TRACT
  Filled 2020-08-29: qty 3

## 2020-08-29 NOTE — ED Provider Notes (Signed)
[facility] Emergency Department Provider Note   ____________________________________________   First MD Initiated Contact with Patient 08/29/20 1125     (approximate)  I have reviewed the triage vital signs and the nursing notes.   HISTORY  Chief Complaint Shortness of Breath and Constipation    HPI [name] [name] is a 86 y.o. female with medical history of COPD, anemia, arrhythmia, CHF, and hypertension who presents via EMS with complaints of shortness of breath that been worsening over the last week.  Patient states that she initially cannot walk to her mailbox and back without becoming extremely short of breath.  Patient states that approximately 2 days prior to arrival she was unable to walk across her kitchen without becoming extremely short of breath.  Patient denies any chest pain or tightness.  Patient states that she uses albuterol nebulizer as well as her daily inhaled corticosteroid and has not missed any doses recently.  Patient does endorse rhinorrhea that occurred began approximately 1 week prior to arrival.  Patient denies any productive cough, nausea/vomiting, diarrhea, constipation, abdominal pain, dysuria, or weakness/numbness/paresthesias in any extremity.         Past Medical History:  Diagnosis Date  . Anemia   . Arrhythmia    atrial fibrillation  . Asthma   . Breast cancer ([place]) 1978   left  . CAD (coronary artery disease)   . CHF (congestive heart failure) ([place])   . COPD (chronic obstructive pulmonary disease) ([place])   . DVT (deep venous thrombosis) ([place])   . Esophageal stricture   . Hemorrhoids   . History of knee replacement, total   . Hyperlipidemia   . Hypertension   . Ischemic cardiomyopathy   . Osteoporosis   . Paroxysmal atrial fibrillation (HCC)   . Peripheral arterial disease ([place])   . Sick sinus syndrome [facility])     Patient Active Problem List   Diagnosis Date Noted  . Esophageal stricture 07/12/2018  .  Osteoporosis 07/12/2018  . Urge urinary incontinence 07/12/2018  . Hypersensitivity pneumonitis due to unspecified organic dust ([place]) 05/18/2018  . VHD (valvular heart disease) 05/18/2018  . Chest discomfort 03/16/2017  . Atrial fibrillation ([place]) 03/23/2015  . SOB (shortness of breath) 03/06/2014  . Essential (primary) hypertension 12/29/2013  . Hyperlipidemia, unspecified 12/29/2013  . Pacemaker 10/26/2007    Past Surgical History:  Procedure Laterality Date  . ABDOMINAL HYSTERECTOMY  1985  . HEMORRHOID SURGERY    . ILIAC ARTERY STENT    . INTRAOPERATIVE ARTERIOGRAM Right   . MASTECTOMY Left 1978  . PACEMAKER INSERTION Left 01/11/2020   Procedure: PACEMAKER GENERATOR CHANGE OUT;  Surgeon: Isaias Cowman, MD;  Location: [facility] ORS;  Service: Cardiovascular;  Laterality: Left;  . REDUCTION MAMMAPLASTY Right 1985   impant placed then removed    Prior to Admission medications   Medication Sig Start Date End Date Taking? Authorizing Provider  albuterol (PROVENTIL HFA;VENTOLIN HFA) 108 (90 BASE) MCG/ACT inhaler Inhale 2 puffs into the lungs every 6 (six) hours as needed for wheezing or shortness of breath.    [provider]  alendronate (FOSAMAX) 70 MG tablet Take 70 mg by mouth once a week. Take with a full glass of water on an empty stomach.    [provider]  atorvastatin (LIPITOR) 40 MG tablet Take 40 mg by mouth every evening.    [provider]  azithromycin (ZITHROMAX Z-PAK) 250 MG tablet Take 2 tablets (500 mg) on  Day 1,  followed by 1  tablet (250 mg) once daily on Days 2 through 5. 08/29/20 09/03/20  Naaman Plummer, MD  budesonide-formoterol (SYMBICORT) 160-4.5 MCG/ACT inhaler Inhale 2 puffs into the lungs 2 (two) times daily. 07/27/18   Wilhelmina Mcardle, MD  diltiazem (DILTIAZEM CD) 180 MG 24 hr capsule Take 180 mg by mouth daily.    [provider]  furosemide (LASIX) 20 MG tablet Take 2 tablets (40 mg total) by mouth 2 (two) times  daily. Take a second dose at 1-2 PM Patient taking differently: Take 40 mg by mouth daily.  07/27/18   Wilhelmina Mcardle, MD  ipratropium-albuterol (DUONEB) 0.5-2.5 (3) MG/3ML SOLN Take 3 mLs by nebulization every 4 (four) hours as needed.    [provider]  metolazone (ZAROXOLYN) 2.5 MG tablet Take 2.5 mg by mouth as directed. Pt takes every other Monday. 09/21/18   [provider]  metoprolol succinate (TOPROL-XL) 100 MG 24 hr tablet Take 50 mg by mouth daily. Take with or immediately following a meal.     [provider]  omeprazole (PRILOSEC) 40 MG capsule Take 40 mg by mouth daily.    [provider]  oxybutynin (DITROPAN) 5 MG tablet Take 5 mg by mouth 3 (three) times daily. 01/06/17   [provider]  potassium chloride (KLOR-CON M10) 10 MEQ tablet Take 1 tablet (10 mEq total) by mouth daily. Patient taking differently: Take 20 mEq by mouth daily.  08/16/18   Wilhelmina Mcardle, MD  predniSONE (DELTASONE) 20 MG tablet Take 3 tablets (60 mg total) by mouth daily for 5 days. 08/29/20 09/03/20  Naaman Plummer, MD  quinapril (ACCUPRIL) 40 MG tablet Take 40 mg by mouth daily.    [provider]  tiotropium (SPIRIVA) 18 MCG inhalation capsule Place 18 mcg into inhaler and inhale daily.    [provider]  warfarin (COUMADIN) 5 MG tablet Take 5 mg by mouth daily.    [provider]    Allergies Penicillins, Amoxicillin, Beta adrenergic blockers, Meloxicam, Naproxen, and Nsaids  Family History  Problem Relation Age of Onset  . Hypertension Father   . Heart disease Father   . Clotting disorder Father   . Cancer Mother        breast  . Heart disease Sister   . Thyroid disease Sister   . Diabetes Sister   . Breast cancer Daughter        90's, twice diagnosed, double mastectomy  . Breast cancer Daughter        64's    Social History Social History   Tobacco Use  . Smoking status: Former Smoker    Types: Cigarettes     Quit date: 12/05/1986    Years since quitting: 33.7  . Smokeless tobacco: Never Used  Vaping Use  . Vaping Use: Never used  Substance Use Topics  . Alcohol use: No    Alcohol/week: 0.0 standard drinks  . Drug use: No    Review of Systems Constitutional: No fever/chills Eyes: No visual changes. ENT: No sore throat. Cardiovascular: Denies chest pain. Respiratory: Endorses shortness of breath. Gastrointestinal: No abdominal pain.  No nausea, no vomiting.  No diarrhea. Genitourinary: Negative for dysuria. Musculoskeletal: Negative for acute arthralgias Skin: Negative for rash. Neurological: Negative for headaches, weakness/numbness/paresthesias in any extremity Psychiatric: Negative for suicidal ideation/homicidal ideation   ____________________________________________   PHYSICAL EXAM:  VITAL SIGNS: ED Triage Vitals  Enc Vitals Group     BP 08/29/20 1111 (!) 146/63  Pulse Rate 08/29/20 1111 85     Resp 08/29/20 1111 16     Temp 08/29/20 1111 98 F (36.7 C)     Temp Source 08/29/20 1111 Oral     SpO2 08/29/20 1111 99 %     Weight 08/29/20 1113 184 lb (83.5 kg)     Height 08/29/20 1113 5\' 3"  (1.6 m)     Head Circumference --      Peak Flow --      Pain Score 08/29/20 1113 5     Pain Loc --      Pain Edu? --      Excl. in [place]? --    Constitutional: Alert and oriented. Well appearing and in no acute distress. Eyes: Conjunctivae are normal. PERRL. Head: Atraumatic. Nose: No congestion/rhinnorhea. Mouth/Throat: Mucous membranes are moist. Neck: No stridor Cardiovascular: Grossly normal heart sounds.  Good peripheral circulation. Respiratory: Expiratory wheezes over bilateral lung fields.  Rhonchi on expiration of the bilateral lung fields Gastrointestinal: Soft and nontender. No distention. Musculoskeletal: No obvious deformities Neurologic:  Normal speech and language. No gross focal neurologic deficits are appreciated. Skin:  Skin is warm and dry. No rash  noted. Psychiatric: Mood and affect are normal. Speech and behavior are normal.  ____________________________________________   LABS (all labs ordered are listed, but only abnormal results are displayed)  Labs Reviewed  BASIC METABOLIC PANEL - Abnormal; Notable for the following components:      Result Value   BUN 27 (*)    Creatinine, Ser 1.19 (*)    GFR, Estimated 43 (*)    All other components within normal limits  CBC - Abnormal; Notable for the following components:   MCV 101.1 (*)    Platelets 141 (*)    All other components within normal limits  PROTIME-INR - Abnormal; Notable for the following components:   Prothrombin Time 20.1 (*)    INR 1.8 (*)    All other components within normal limits  RESP PANEL BY RT-PCR (FLU A&B, COVID) ARPGX2  TROPONIN I (HIGH SENSITIVITY)  TROPONIN I (HIGH SENSITIVITY)   ____________________________________________  EKG  ED ECG REPORT I, Naaman Plummer, the attending physician, personally viewed and interpreted this ECG.  Date: 08/29/2020 EKG Time: 1057 Rate: 90 Rhythm: Atrial fibrillation with intermittent ventricular paced beats QRS Axis: normal Intervals: normal ST/T Wave abnormalities: normal Narrative Interpretation: no evidence of acute ischemia  ____________________________________________  RADIOLOGY  ED MD interpretation: 2 view x-ray of the chest shows patchy right basilar opacities and a small pleural effusion that is new from previous exam.  No evidence of acute pneumothorax or widened mediastinum  Official radiology report(s): DG Chest 2 View  Result Date: 08/29/2020 CLINICAL DATA:  sob EXAM: CHEST - 2 VIEW COMPARISON:  07/27/2018 and prior. FINDINGS: Left chest pacing device with leads terminating over the right heart. Sequela of right shoulder arthroplasty. Hypoinflated lungs. Patchy right basilar opacities and small pleural effusion, new since prior exam. No pneumothorax. Diffuse interstitial prominence. Stable  cardiomediastinal silhouette. Osteopenia and multilevel spondylosis. IMPRESSION: Patchy right basilar opacities and small pleural effusion, new since prior exam. Emphysema. Electronically Signed   By: Primitivo Gauze M.D.   On: 08/29/2020 11:59    ____________________________________________   PROCEDURES  Procedure(s) performed (including Critical Care):  .1-3 Lead EKG Interpretation Performed by: Naaman Plummer, MD Authorized by: Naaman Plummer, MD     Interpretation: normal     ECG rate:  92   ECG rate assessment: normal  Rhythm: atrial fibrillation     Ectopy: none     Conduction: normal       ____________________________________________   INITIAL IMPRESSION / ASSESSMENT AND PLAN / ED COURSE  As part of my medical decision making, I reviewed the following data within the [facility] notes reviewed and incorporated, Labs reviewed, EKG interpreted, Old chart reviewed, Radiograph reviewed and Notes from prior ED visits reviewed and incorporated        The patient appears to be suffering from a moderate exacerbation of COPD.  Based on the history, exam, CXR/EKG, and further workup I dont suspect any other emergent cause of this presentation, such as pneumonia, acute coronary syndrome, congestive heart failure, pulmonary embolism, or pneumothorax.  ED Interventions: bronchodilators, steroids, antibiotics, reassess  1355 Reassessment: After treatment, the patients shortness of breath is resolved, and their lung exam has returned to baseline. They are comfortable and want to go home.  Rx: Steroids, Antibiotics Disposition: Discharge home with SRP. PCP follow up recommended in next 48hours.      ____________________________________________   FINAL CLINICAL IMPRESSION(S) / ED DIAGNOSES  Final diagnoses:  Shortness of breath  COPD exacerbation [facility])     ED Discharge Orders         Ordered    azithromycin (ZITHROMAX Z-PAK) 250 MG  tablet        08/29/20 1343    predniSONE (DELTASONE) 20 MG tablet  Daily        08/29/20 1343           Note:  This document was prepared using Dragon voice recognition software and may include unintentional dictation errors.   Naaman Plummer, MD 08/29/20 1401

## 2020-08-29 NOTE — ED Triage Notes (Signed)
Pt c/o sob that started yesterday. Hard to speak in complete sentences. HX CHF Also c/o constipation for around a week.

## 2020-08-29 NOTE — ED Notes (Signed)
First RN Note: pt to ED via POV, pt c/o increasing SOB at this time, pt states she is currently in CHF at this time. Pt A&O x4. Pt with noted labored breathing on arrival to ED after walking and with some noted difficulty speaking in full sentences.

## 2020-09-03 DIAGNOSIS — I10 Essential (primary) hypertension: Secondary | ICD-10-CM | POA: Diagnosis not present

## 2020-09-03 DIAGNOSIS — Z950 Presence of cardiac pacemaker: Secondary | ICD-10-CM | POA: Diagnosis not present

## 2020-09-03 DIAGNOSIS — I480 Paroxysmal atrial fibrillation: Secondary | ICD-10-CM | POA: Diagnosis not present

## 2020-09-03 DIAGNOSIS — J440 Chronic obstructive pulmonary disease with acute lower respiratory infection: Secondary | ICD-10-CM | POA: Diagnosis not present

## 2020-09-03 DIAGNOSIS — J679 Hypersensitivity pneumonitis due to unspecified organic dust: Secondary | ICD-10-CM | POA: Diagnosis not present

## 2020-09-03 DIAGNOSIS — J209 Acute bronchitis, unspecified: Secondary | ICD-10-CM | POA: Diagnosis not present

## 2020-09-03 DIAGNOSIS — I38 Endocarditis, valve unspecified: Secondary | ICD-10-CM | POA: Diagnosis not present

## 2020-09-03 DIAGNOSIS — F411 Generalized anxiety disorder: Secondary | ICD-10-CM | POA: Diagnosis not present

## 2020-09-05 DIAGNOSIS — I480 Paroxysmal atrial fibrillation: Secondary | ICD-10-CM | POA: Diagnosis not present

## 2020-09-06 DIAGNOSIS — R0602 Shortness of breath: Secondary | ICD-10-CM | POA: Diagnosis not present

## 2020-09-06 DIAGNOSIS — E785 Hyperlipidemia, unspecified: Secondary | ICD-10-CM | POA: Diagnosis not present

## 2020-09-06 DIAGNOSIS — Z23 Encounter for immunization: Secondary | ICD-10-CM | POA: Diagnosis not present

## 2020-09-06 DIAGNOSIS — I10 Essential (primary) hypertension: Secondary | ICD-10-CM | POA: Diagnosis not present

## 2020-09-06 DIAGNOSIS — I5032 Chronic diastolic (congestive) heart failure: Secondary | ICD-10-CM | POA: Diagnosis not present

## 2020-09-06 DIAGNOSIS — I480 Paroxysmal atrial fibrillation: Secondary | ICD-10-CM | POA: Diagnosis not present

## 2020-09-06 DIAGNOSIS — Z950 Presence of cardiac pacemaker: Secondary | ICD-10-CM | POA: Diagnosis not present

## 2020-09-06 DIAGNOSIS — R0789 Other chest pain: Secondary | ICD-10-CM | POA: Diagnosis not present

## 2020-09-11 DIAGNOSIS — J439 Emphysema, unspecified: Secondary | ICD-10-CM | POA: Diagnosis not present

## 2020-09-11 DIAGNOSIS — I517 Cardiomegaly: Secondary | ICD-10-CM | POA: Diagnosis not present

## 2020-09-11 DIAGNOSIS — R0602 Shortness of breath: Secondary | ICD-10-CM | POA: Diagnosis not present

## 2020-09-11 DIAGNOSIS — J9811 Atelectasis: Secondary | ICD-10-CM | POA: Diagnosis not present

## 2020-09-11 DIAGNOSIS — J90 Pleural effusion, not elsewhere classified: Secondary | ICD-10-CM | POA: Diagnosis not present

## 2020-09-17 DIAGNOSIS — B351 Tinea unguium: Secondary | ICD-10-CM | POA: Diagnosis not present

## 2020-09-17 DIAGNOSIS — I739 Peripheral vascular disease, unspecified: Secondary | ICD-10-CM | POA: Diagnosis not present

## 2020-09-26 DIAGNOSIS — I480 Paroxysmal atrial fibrillation: Secondary | ICD-10-CM | POA: Diagnosis not present

## 2020-10-10 DIAGNOSIS — I480 Paroxysmal atrial fibrillation: Secondary | ICD-10-CM | POA: Diagnosis not present

## 2020-10-10 DIAGNOSIS — R8290 Unspecified abnormal findings in urine: Secondary | ICD-10-CM | POA: Diagnosis not present

## 2020-10-10 DIAGNOSIS — R399 Unspecified symptoms and signs involving the genitourinary system: Secondary | ICD-10-CM | POA: Diagnosis not present

## 2020-10-26 DIAGNOSIS — J432 Centrilobular emphysema: Secondary | ICD-10-CM | POA: Diagnosis not present

## 2020-10-26 DIAGNOSIS — R0600 Dyspnea, unspecified: Secondary | ICD-10-CM | POA: Diagnosis not present

## 2020-11-01 DIAGNOSIS — I872 Venous insufficiency (chronic) (peripheral): Secondary | ICD-10-CM | POA: Diagnosis not present

## 2020-11-01 DIAGNOSIS — L97219 Non-pressure chronic ulcer of right calf with unspecified severity: Secondary | ICD-10-CM | POA: Diagnosis not present

## 2020-11-05 DIAGNOSIS — I480 Paroxysmal atrial fibrillation: Secondary | ICD-10-CM | POA: Diagnosis not present

## 2020-11-26 DIAGNOSIS — J440 Chronic obstructive pulmonary disease with acute lower respiratory infection: Secondary | ICD-10-CM | POA: Diagnosis not present

## 2020-11-26 DIAGNOSIS — I480 Paroxysmal atrial fibrillation: Secondary | ICD-10-CM | POA: Diagnosis not present

## 2020-11-26 DIAGNOSIS — Z23 Encounter for immunization: Secondary | ICD-10-CM | POA: Diagnosis not present

## 2020-11-26 DIAGNOSIS — I10 Essential (primary) hypertension: Secondary | ICD-10-CM | POA: Diagnosis not present

## 2020-11-26 DIAGNOSIS — I495 Sick sinus syndrome: Secondary | ICD-10-CM | POA: Diagnosis not present

## 2020-11-26 DIAGNOSIS — I361 Nonrheumatic tricuspid (valve) insufficiency: Secondary | ICD-10-CM | POA: Diagnosis not present

## 2020-11-26 DIAGNOSIS — I38 Endocarditis, valve unspecified: Secondary | ICD-10-CM | POA: Diagnosis not present

## 2020-11-26 DIAGNOSIS — R0602 Shortness of breath: Secondary | ICD-10-CM | POA: Diagnosis not present

## 2020-11-26 DIAGNOSIS — I5032 Chronic diastolic (congestive) heart failure: Secondary | ICD-10-CM | POA: Diagnosis not present

## 2020-11-26 DIAGNOSIS — E785 Hyperlipidemia, unspecified: Secondary | ICD-10-CM | POA: Diagnosis not present

## 2020-11-26 DIAGNOSIS — F411 Generalized anxiety disorder: Secondary | ICD-10-CM | POA: Diagnosis not present

## 2020-11-26 DIAGNOSIS — J679 Hypersensitivity pneumonitis due to unspecified organic dust: Secondary | ICD-10-CM | POA: Diagnosis not present

## 2020-11-26 DIAGNOSIS — Z950 Presence of cardiac pacemaker: Secondary | ICD-10-CM | POA: Diagnosis not present

## 2020-11-26 DIAGNOSIS — J209 Acute bronchitis, unspecified: Secondary | ICD-10-CM | POA: Diagnosis not present

## 2020-12-04 DIAGNOSIS — I495 Sick sinus syndrome: Secondary | ICD-10-CM | POA: Diagnosis not present

## 2020-12-05 ENCOUNTER — Other Ambulatory Visit: Payer: Self-pay

## 2020-12-05 ENCOUNTER — Encounter: Payer: PPO | Attending: Internal Medicine | Admitting: Internal Medicine

## 2020-12-05 DIAGNOSIS — I739 Peripheral vascular disease, unspecified: Secondary | ICD-10-CM | POA: Insufficient documentation

## 2020-12-05 DIAGNOSIS — L97812 Non-pressure chronic ulcer of other part of right lower leg with fat layer exposed: Secondary | ICD-10-CM | POA: Diagnosis not present

## 2020-12-05 DIAGNOSIS — L97221 Non-pressure chronic ulcer of left calf limited to breakdown of skin: Secondary | ICD-10-CM | POA: Insufficient documentation

## 2020-12-05 DIAGNOSIS — I509 Heart failure, unspecified: Secondary | ICD-10-CM | POA: Diagnosis not present

## 2020-12-05 DIAGNOSIS — I480 Paroxysmal atrial fibrillation: Secondary | ICD-10-CM | POA: Insufficient documentation

## 2020-12-05 DIAGNOSIS — L97212 Non-pressure chronic ulcer of right calf with fat layer exposed: Secondary | ICD-10-CM | POA: Diagnosis not present

## 2020-12-05 DIAGNOSIS — J449 Chronic obstructive pulmonary disease, unspecified: Secondary | ICD-10-CM | POA: Insufficient documentation

## 2020-12-05 DIAGNOSIS — I110 Hypertensive heart disease with heart failure: Secondary | ICD-10-CM | POA: Insufficient documentation

## 2020-12-05 DIAGNOSIS — I87313 Chronic venous hypertension (idiopathic) with ulcer of bilateral lower extremity: Secondary | ICD-10-CM | POA: Diagnosis not present

## 2020-12-05 DIAGNOSIS — I87333 Chronic venous hypertension (idiopathic) with ulcer and inflammation of bilateral lower extremity: Secondary | ICD-10-CM | POA: Diagnosis not present

## 2020-12-05 DIAGNOSIS — L97211 Non-pressure chronic ulcer of right calf limited to breakdown of skin: Secondary | ICD-10-CM | POA: Insufficient documentation

## 2020-12-05 DIAGNOSIS — I872 Venous insufficiency (chronic) (peripheral): Secondary | ICD-10-CM | POA: Diagnosis not present

## 2020-12-05 NOTE — Progress Notes (Signed)
[name], [name] ([id number]) Visit Report for 12/05/2020 Abuse/Suicide Risk Screen Details Patient Name: [name] [name], [name] [name]. Date of Service: 12/05/2020 9:30 AM Medical Record Number: [id number] Patient Account Number: [id number] Date of Birth/Sex: Jul 13, 1927 (87 y.o. F) Treating RN: Dolan Amen Primary Care Corina Stacy: Tracie Harrier Other Clinician: Referring Kimmie Berggren: Referral, Self Treating Felipe Cabell/Extender: Tito Dine in Treatment: 0 Abuse/Suicide Risk Screen Items Answer ABUSE RISK SCREEN: Has anyone close to you tried to hurt or harm you recentlyo No Do you feel uncomfortable with anyone in your familyo No Has anyone forced you do things that you didnot want to doo No Electronic Signature(s) Signed: 12/05/2020 5:04:06 PM By: Georges Mouse, Minus Breeding RN Entered By: Georges Mouse, Minus Breeding on 12/05/2020 09:50:08 [name] [name] ([id number]) -------------------------------------------------------------------------------- Activities of Daily Living Details Patient Name: [name], [name] Date of Service: 12/05/2020 9:30 AM Medical Record Number: [id number] Patient Account Number: [id number] Date of Birth/Sex: 04-03-1927 (87 y.o. F) Treating RN: Dolan Amen Primary Care Kanyah Matsushima: Tracie Harrier Other Clinician: Referring Kaytee Taliercio: Referral, Self Treating Abbiegail Landgren/Extender: Tito Dine in Treatment: 0 Activities of Daily Living Items Answer Activities of Daily Living (Please select one for each item) Drive Automobile Not Able Take Medications Completely Able Use Telephone Completely Able Care for Appearance Completely Able Use Toilet Completely Able Bath / Shower Completely Able Dress Self Completely Able Feed Self Completely Able Walk Completely Able Get In / Out Bed Completely Able Housework Completely Able Prepare Meals Completely [place] for Self Completely Able Electronic Signature(s) Signed: 12/05/2020 5:04:06  PM By: Georges Mouse, Minus Breeding RN Entered By: Georges Mouse, Minus Breeding on 12/05/2020 09:50:51 [name] [name] ([id number]) -------------------------------------------------------------------------------- Education Screening Details Patient Name: [name] [name] Date of Service: 12/05/2020 9:30 AM Medical Record Number: [id number] Patient Account Number: [id number] Date of Birth/Sex: [birth year] (87 y.o. F) Treating RN: Dolan Amen Primary Care Valori Hollenkamp: Tracie Harrier Other Clinician: Referring Rondy Krupinski: Referral, Self Treating Kaesyn Johnston/Extender: Tito Dine in Treatment: 0 Primary Learner Assessed: Patient Learning Preferences/Education Level/Primary Language Learning Preference: Explanation, Demonstration Highest Education Level: College or Above Preferred Language: English Cognitive Barrier Language Barrier: No Translator Needed: No Memory Deficit: No Emotional Barrier: No Cultural/Religious Beliefs Affecting Medical Care: No Physical Barrier Impaired Vision: No Impaired Hearing: No Decreased Hand dexterity: No Knowledge/Comprehension Knowledge Level: Medium Comprehension Level: Medium Ability to understand written instructions: Medium Ability to understand verbal instructions: Medium Motivation Anxiety Level: Calm Cooperation: Cooperative Education Importance: Acknowledges Need Interest in Health Problems: Asks Questions Perception: Coherent Willingness to Engage in Self-Management High Activities: Readiness to Engage in Self-Management High Activities: Electronic Signature(s) Signed: 12/05/2020 5:04:06 PM By: Georges Mouse, Minus Breeding RN Entered By: Georges Mouse, Minus Breeding on 12/05/2020 09:51:41 [name] [name] ([id number]) -------------------------------------------------------------------------------- Fall Risk Assessment Details Patient Name: [name] [name] Date of Service: 12/05/2020 9:30 AM Medical Record Number: [id number] Patient Account  Number: [id number] Date of Birth/Sex: June 22, 1927 (87 y.o. F) Treating RN: Dolan Amen Primary Care Ansleigh Safer: Tracie Harrier Other Clinician: Referring Shelsy Seng: Referral, Self Treating Katleen Carraway/Extender: Tito Dine in Treatment: 0 Fall Risk Assessment Items Have you had 2 or more falls in the last 12 monthso 0 No Have you had any fall that resulted in injury in the last 12 monthso 0 Yes FALLS RISK SCREEN History of falling - immediate or within 3 months 0 No Secondary diagnosis (Do you have 2 or more medical diagnoseso) 15 Yes Ambulatory aid None/bed rest/wheelchair/nurse 0 No Crutches/cane/walker 15 Yes Furniture 0 No Intravenous therapy Access/Saline/Heparin Lock 0 No  Gait/Transferring Normal/ bed rest/ wheelchair 0 No Weak (short steps with or without shuffle, stooped but able to lift head while walking, may 10 Yes seek support from furniture) Impaired (short steps with shuffle, may have difficulty arising from chair, head down, impaired 0 No balance) Mental Status Oriented to own ability 0 Yes Electronic Signature(s) Signed: 12/05/2020 5:04:06 PM By: Georges Mouse, Minus Breeding RN Entered By: Georges Mouse, [place] on 12/05/2020 09:52:44 [name] [name] ([id number]) -------------------------------------------------------------------------------- Foot Assessment Details Patient Name: [name] [name] Date of Service: 12/05/2020 9:30 AM Medical Record Number: [id number] Patient Account Number: [id number] Date of Birth/Sex: 1927-01-29 (87 y.o. F) Treating RN: Dolan Amen Primary Care Jazmen Lindenbaum: Tracie Harrier Other Clinician: Referring Keena Heesch: Referral, Self Treating Dhairya Corales/Extender: Tito Dine in Treatment: 0 Foot Assessment Items Site Locations + = Sensation present, - = Sensation absent, C = Callus, U = Ulcer R = Redness, W = Warmth, M = Maceration, PU = Pre-ulcerative lesion F = Fissure, S = Swelling, D = Dryness Assessment Right:  Left: Other Deformity: No No Prior Foot Ulcer: No No Prior Amputation: No No Charcot Joint: No No Ambulatory Status: Ambulatory With Help Assistance Device: Cane Gait: Steady Electronic Signature(s) Signed: 12/05/2020 5:04:06 PM By: Georges Mouse, Minus Breeding RN Entered By: Georges Mouse, Minus Breeding on 12/05/2020 10:00:01 [name] [name] ([id number]) -------------------------------------------------------------------------------- Nutrition Risk Screening Details Patient Name: [name] [name] Date of Service: 12/05/2020 9:30 AM Medical Record Number: [id number] Patient Account Number: [id number] Date of Birth/Sex: [birth year] (87 y.o. F) Treating RN: Dolan Amen Primary Care Reynalda Canny: Tracie Harrier Other Clinician: Referring Mearl Harewood: Referral, Self Treating Lynze Reddy/Extender: Tito Dine in Treatment: 0 Height (in): 63 Weight (lbs): 174 Body Mass Index (BMI): 30.8 Nutrition Risk Screening Items Score Screening NUTRITION RISK SCREEN: I have an illness or condition that made me change the kind and/or amount of food I eat 0 No I eat fewer than two meals per day 0 No I eat few fruits and vegetables, or milk products 0 No I have three or more drinks of beer, liquor or wine almost every day 0 No I have tooth or mouth problems that make it hard for me to eat 0 No I don't always have enough money to buy the food I need 0 No I eat alone most of the time 0 No I take three or more different prescribed or over-the-counter drugs a day 1 Yes Without wanting to, I have lost or gained 10 pounds in the last six months 0 No I am not always physically able to shop, cook and/or feed myself 2 Yes Nutrition Protocols Good Risk Protocol Moderate Risk Protocol 0 Provide education on nutrition High Risk Proctocol Risk Level: Moderate Risk Score: 3 Electronic Signature(s) Signed: 12/05/2020 5:04:06 PM By: Georges Mouse, Minus Breeding RN Entered By: Georges Mouse, Minus Breeding on 12/05/2020 09:54:02

## 2020-12-06 NOTE — Progress Notes (Signed)
MARGIE, URBANOWICZ ([id number]) Visit Report for 12/05/2020 Chief Complaint Document Details Patient Name: [name] [name], [name] [name]. Date of Service: 12/05/2020 9:30 AM Medical Record Number: [id number] Patient Account Number: [id number] Date of Birth/Sex: [birth year] (87 y.o. F) Treating RN: Cornell Barman Primary Care Provider: Tracie Harrier Other Clinician: Referring Provider: Referral, Self Treating Provider/Extender: Tito Dine in Treatment: 0 Information Obtained from: Patient Chief Complaint 12/05/2020; patient is here for review of 2 small wounds on the right posterior calf and 1 on the left posterior calf Electronic Signature(s) Signed: 12/06/2020 2:54:56 PM By: Linton Ham MD Entered By: Linton Ham on 12/05/2020 10:44:58 [name] [name] ([id number]) -------------------------------------------------------------------------------- Debridement Details Patient Name: [name] [name]. Date of Service: 12/05/2020 9:30 AM Medical Record Number: [id number] Patient Account Number: [id number] Date of Birth/Sex: [birth year] (87 y.o. F) Treating RN: Cornell Barman Primary Care Provider: Tracie Harrier Other Clinician: Referring Provider: Referral, Self Treating Provider/Extender: Tito Dine in Treatment: 0 Debridement Performed for Wound #1 Right,Proximal,Posterior Lower Leg Assessment: Performed By: Physician Ricard Dillon, MD Debridement Type: Debridement Severity of Tissue Pre Debridement: Fat layer exposed Level of Consciousness (Pre- Awake and Alert procedure): Pre-procedure Verification/Time Out Yes - 10:31 Taken: Total Area Debrided (L x W): 2 (cm) x 1.4 (cm) = 2.8 (cm) Tissue and other material Viable, Non-Viable, Slough, Subcutaneous, Slough debrided: Level: Skin/Subcutaneous Tissue Debridement Description: Excisional Instrument: Curette Bleeding: Minimum Hemostasis Achieved: Pressure Response to Treatment: Procedure was tolerated well Level of  Consciousness (Post- Awake and Alert procedure): Post Debridement Measurements of Total Wound Length: (cm) 2.5 Width: (cm) 1.4 Depth: (cm) 0.2 Volume: (cm) 0.55 Character of Wound/Ulcer Post Debridement: Stable Severity of Tissue Post Debridement: Fat layer exposed Post Procedure Diagnosis Same as Pre-procedure Electronic Signature(s) Signed: 12/05/2020 6:32:03 PM By: Gretta Cool, BSN, RN, CWS, Kim RN, BSN Signed: 12/06/2020 2:54:56 PM By: Linton Ham MD Entered By: Gretta Cool, BSN, RN, CWS, Kim on 12/05/2020 10:31:46 [name] [name] ([id number]) -------------------------------------------------------------------------------- Debridement Details Patient Name: [name], [name] Date of Service: 12/05/2020 9:30 AM Medical Record Number: [id number] Patient Account Number: [id number] Date of Birth/Sex: [birth year] (87 y.o. F) Treating RN: Cornell Barman Primary Care Provider: Tracie Harrier Other Clinician: Referring Provider: Referral, Self Treating Provider/Extender: Tito Dine in Treatment: 0 Debridement Performed for Wound #2 Right,Distal,Posterior Lower Leg Assessment: Performed By: Physician Ricard Dillon, MD Debridement Type: Debridement Severity of Tissue Pre Debridement: Fat layer exposed Level of Consciousness (Pre- Awake and Alert procedure): Pre-procedure Verification/Time Out Yes - 10:31 Taken: Total Area Debrided (L x W): 1 (cm) x 0.7 (cm) = 0.7 (cm) Tissue and other material Viable, Non-Viable, Slough, Subcutaneous, Slough debrided: Level: Skin/Subcutaneous Tissue Debridement Description: Excisional Instrument: Curette Bleeding: Minimum Hemostasis Achieved: Pressure Response to Treatment: Procedure was tolerated well Level of Consciousness (Post- Awake and Alert procedure): Post Debridement Measurements of Total Wound Length: (cm) 1 Width: (cm) 0.7 Depth: (cm) 0.2 Volume: (cm) 0.11 Character of Wound/Ulcer Post Debridement: Stable Severity of  Tissue Post Debridement: Fat layer exposed Post Procedure Diagnosis Same as Pre-procedure Electronic Signature(s) Signed: 12/05/2020 6:32:03 PM By: Gretta Cool, BSN, RN, CWS, Kim RN, BSN Signed: 12/06/2020 2:54:56 PM By: Linton Ham MD Entered By: Gretta Cool, BSN, RN, CWS, Kim on 12/05/2020 10:32:23 [name] [name] ([id number]) -------------------------------------------------------------------------------- HPI Details Patient Name: [name], [name] Date of Service: 12/05/2020 9:30 AM Medical Record Number: [id number] Patient Account Number: [id number] Date of Birth/Sex: December 01, 1926 (87 y.o. F) Treating RN: Cornell Barman Primary Care Provider: Tracie Harrier Other Clinician: Referring Provider: Referral, Self Treating  Provider/Extender: Ricard Dillon Weeks in Treatment: 0 History of Present Illness HPI Description: ADMISSION 12/05/2020 This is a 87 year old woman who arrived accompanied by her daughter from [place]. She has been dealing with wounds on her bilateral posterior calf since January. There is no obvious antecedent injury. Her daughter tells me that she is actually had wounds that have opened and closed on her legs since last summer. She currently she has 2 wounds on the right posterior calf and 1 on the left. They have been using Vaseline on the wounds and covering the area with kerlix but they have not been healing. The patient has been wearing what appears to be support stockings. Past medical history includes paroxysmal A. fib, pacemaker,, hypertension, PVD, COPD, left mastectomy chronic lower extremity swelling ABIs were noncompressible bilateral Electronic Signature(s) Signed: 12/06/2020 2:54:56 PM By: Linton Ham MD Entered By: Linton Ham on 12/05/2020 11:16:02 [name] [name] ([id number]) -------------------------------------------------------------------------------- Physical Exam Details Patient Name: [name] [name] Date of Service: 12/05/2020 9:30  AM Medical Record Number: [id number] Patient Account Number: [id number] Date of Birth/Sex: Oct 25, 1926 (87 y.o. F) Treating RN: Cornell Barman Primary Care Provider: Tracie Harrier Other Clinician: Referring Provider: Referral, Self Treating Provider/Extender: Tito Dine in Treatment: 0 Constitutional Sitting or standing Blood Pressure is within target range for patient.. Pulse regular and within target range for patient.Marland Kitchen Respirations regular, non- labored and within target range.. Temperature is normal and within the target range for the patient.Marland Kitchen appears in no distress. Cardiovascular Popliteal pulses palpable. Had trouble feeling her femoral pulse although this may have been positioning. Dorsalis pedis pulses bilaterally palpable. Posterior tibial reduced but palpable. Chronic venous changes in the bilateral lower legs. Hemosiderin on the right some degree of stasis dermatitis on the left.Marland Kitchen Psychiatric No evidence of depression, anxiety, or agitation. Calm, cooperative, and communicative. Appropriate interactions and affect.. Notes Wound exam; the patient has 2 small areas on the right posterior calf. Both of which were covered in an adherent surface debris. The area on the left was also small but with a better looking wound surface I used a #3 curette to remove both right calf wounds of very fibrinous adherent debris. Hemostasis with direct pressure Electronic Signature(s) Signed: 12/06/2020 2:54:56 PM By: Linton Ham MD Entered By: Linton Ham on 12/05/2020 11:17:59 [name] [name] ([id number]) -------------------------------------------------------------------------------- Physician Orders Details Patient Name: [name] [name] Date of Service: 12/05/2020 9:30 AM Medical Record Number: [id number] Patient Account Number: [id number] Date of Birth/Sex: 04/11/1927 (87 y.o. F) Treating RN: Cornell Barman Primary Care Provider: Tracie Harrier Other Clinician: Referring  Provider: Referral, Self Treating Provider/Extender: Tito Dine in Treatment: 0 Verbal / Phone Orders: No Diagnosis Coding Follow-up Appointments o Return Appointment in 1 week. o Nurse Visit as needed Bathing/ Shower/ Hygiene o May shower with wound dressing protected with water repellent cover or cast protector. Edema Control - Lymphedema / Segmental Compressive Device / Other Bilateral Lower Extremities o Optional: One layer of unna paste to top of compression wrap (to act as an anchor). o 3 Layer Compression System for Lymphedema. o Elevate, Exercise Daily and Avoid Standing for Long Periods of Time. o Elevate legs to the level of the heart and pump ankles as often as possible o Elevate leg(s) parallel to the floor when sitting. Additional Orders / Instructions o Follow Nutritious Diet and Increase Protein Intake Wound Treatment Wound #1 - Lower Leg Wound Laterality: Right, Posterior, Proximal Primary Dressing: Hydrofera Blue Ready Transfer Foam, 2.5x2.5 (in/in) Discharge Instructions:  Apply Hydrofera Blue Ready to wound bed as directed Secondary Dressing: Xtrasorb Medium 4x5 (in/in) Discharge Instructions: Apply to wound as directed. Do not cut. Compression Wrap: Profore Lite LF 3 Multilayer Compression Bandaging System Discharge Instructions: Apply 3 multi-layer wrap as prescribed. Wound #2 - Lower Leg Wound Laterality: Right, Posterior, Distal Primary Dressing: Hydrofera Blue Ready Transfer Foam, 2.5x2.5 (in/in) Discharge Instructions: Apply Hydrofera Blue Ready to wound bed as directed Secondary Dressing: Xtrasorb Medium 4x5 (in/in) Discharge Instructions: Apply to wound as directed. Do not cut. Compression Wrap: Profore Lite LF 3 Multilayer Compression Bandaging System Discharge Instructions: Apply 3 multi-layer wrap as prescribed. Wound #3 - Lower Leg Wound Laterality: Left, Posterior Primary Dressing: Hydrofera Blue Ready Transfer Foam,  2.5x2.5 (in/in) Discharge Instructions: Apply Hydrofera Blue Ready to wound bed as directed Secondary Dressing: Xtrasorb Medium 4x5 (in/in) Discharge Instructions: Apply to wound as directed. Do not cut. Compression Wrap: Profore Lite LF 3 Multilayer Compression Bandaging System Discharge Instructions: Apply 3 multi-layer wrap as prescribed. Electronic Signature(s) Signed: 12/05/2020 6:32:03 PM By: Gretta Cool, BSN, RN, CWS, Kim RN, BSN Signed: 12/06/2020 2:54:56 PM By: Linton Ham MD [name] [name] ([id number]) Entered By: Gretta Cool, BSN, RN, CWS, Kim on 12/05/2020 10:40:43 [name] [name] ([id number]) -------------------------------------------------------------------------------- Problem List Details Patient Name: MARINNA, BLANE Date of Service: 12/05/2020 9:30 AM Medical Record Number: [id number] Patient Account Number: [id number] Date of Birth/Sex: [birth year] (87 y.o. F) Treating RN: Cornell Barman Primary Care Provider: Tracie Harrier Other Clinician: Referring Provider: Referral, Self Treating Provider/Extender: Tito Dine in Treatment: 0 Active Problems ICD-10 Encounter Code Description Active Date MDM Diagnosis I87.333 Chronic venous hypertension (idiopathic) with ulcer and inflammation of 12/05/2020 No Yes bilateral lower extremity L97.221 Non-pressure chronic ulcer of left calf limited to breakdown of skin 12/05/2020 No Yes L97.211 Non-pressure chronic ulcer of right calf limited to breakdown of skin 12/05/2020 No Yes Inactive Problems Resolved Problems Electronic Signature(s) Signed: 12/06/2020 2:54:56 PM By: Linton Ham MD Entered By: Linton Ham on 12/05/2020 10:43:16 [name] [name] ([id number]) -------------------------------------------------------------------------------- Progress Note Details Patient Name: [name] [name] Date of Service: 12/05/2020 9:30 AM Medical Record Number: [id number] Patient Account Number: [id number] Date of Birth/Sex:  Feb 02, 1927 (87 y.o. F) Treating RN: Cornell Barman Primary Care Provider: Tracie Harrier Other Clinician: Referring Provider: Referral, Self Treating Provider/Extender: Tito Dine in Treatment: 0 Subjective Chief Complaint Information obtained from Patient 12/05/2020; patient is here for review of 2 small wounds on the right posterior calf and 1 on the left posterior calf History of Present Illness (HPI) ADMISSION 12/05/2020 This is a 87 year old woman who arrived accompanied by her daughter from [place]. She has been dealing with wounds on her bilateral posterior calf since January. There is no obvious antecedent injury. Her daughter tells me that she is actually had wounds that have opened and closed on her legs since last summer. She currently she has 2 wounds on the right posterior calf and 1 on the left. They have been using Vaseline on the wounds and covering the area with kerlix but they have not been healing. The patient has been wearing what appears to be support stockings. Past medical history includes paroxysmal A. fib, pacemaker,, hypertension, PVD, COPD, left mastectomy chronic lower extremity swelling ABIs were noncompressible bilateral Patient History Information obtained from Patient. Allergies penicillin, amoxicillin, Beta-Blockers (Beta-Adrenergic Blocking Agts), meloxicam, naproxen, NSAIDS (Non-Steroidal Anti-Inflammatory Drug) Social History Former smoker, Alcohol Use - Never, Drug Use - No History, Caffeine Use - Moderate. Medical History Eyes Denies history  of Cataracts, Glaucoma, Optic Neuritis Ear/Nose/Mouth/Throat Denies history of Chronic sinus problems/congestion, Middle ear problems Hematologic/Lymphatic Denies history of Anemia, Hemophilia, Human Immunodeficiency Virus, Lymphedema, Sickle Cell Disease Respiratory Patient has history of Chronic Obstructive Pulmonary Disease (COPD) Denies history of Aspiration, Asthma, Pneumothorax, Sleep  Apnea, Tuberculosis Cardiovascular Patient has history of Arrhythmia, Congestive Heart Failure, Hypertension Denies history of Angina, Coronary Artery Disease, Deep Vein Thrombosis, Hypotension, Myocardial Infarction, Peripheral Arterial Disease, Peripheral Venous Disease, Phlebitis, Vasculitis Gastrointestinal Denies history of Cirrhosis , Colitis, Crohn s, Hepatitis A, Hepatitis B, Hepatitis C Endocrine Denies history of Type I Diabetes Genitourinary Denies history of End Stage Renal Disease Immunological Denies history of Lupus Erythematosus, Raynaud s, Scleroderma Integumentary (Skin) Denies history of History of Burn, History of pressure wounds Musculoskeletal Denies history of Gout, Rheumatoid Arthritis, Osteoarthritis, Osteomyelitis Neurologic Denies history of Dementia, Neuropathy, Quadriplegia, Paraplegia, Seizure Disorder Oncologic Patient has history of Received Chemotherapy - chemo pills Denies history of Received Radiation Psychiatric Denies history of Anorexia/bulimia, Confinement Anxiety Medical And Surgical History Notes Oncologic ARIELIS, LEONHART ([id number]) mastectomy-1976 Review of Systems (ROS) Constitutional Symptoms (General Health) Denies complaints or symptoms of Fatigue, Fever, Chills, Marked Weight Change. Eyes Denies complaints or symptoms of Dry Eyes, Vision Changes, Glasses / Contacts. Ear/Nose/Mouth/Throat Denies complaints or symptoms of Difficult clearing ears, Sinusitis. Hematologic/Lymphatic Denies complaints or symptoms of Bleeding / Clotting Disorders, Human Immunodeficiency Virus. Respiratory Denies complaints or symptoms of Chronic or frequent coughs, Shortness of Breath. Cardiovascular Complains or has symptoms of LE edema. Denies complaints or symptoms of Chest pain. Gastrointestinal Denies complaints or symptoms of Frequent diarrhea, Nausea, Vomiting. Endocrine Denies complaints or symptoms of Hepatitis, Thyroid disease, Polydypsia  (Excessive Thirst). Genitourinary Denies complaints or symptoms of Kidney failure/ Dialysis, Incontinence/dribbling. Immunological Denies complaints or symptoms of Hives, Itching. Integumentary (Skin) Complains or has symptoms of Wounds, Bleeding or bruising tendency, Breakdown, Swelling. Musculoskeletal Denies complaints or symptoms of Muscle Pain, Muscle Weakness. Neurologic Denies complaints or symptoms of Numbness/parasthesias, Focal/Weakness. Psychiatric Denies complaints or symptoms of Anxiety, Claustrophobia. Objective Constitutional Sitting or standing Blood Pressure is within target range for patient.. Pulse regular and within target range for patient.Marland Kitchen Respirations regular, non- labored and within target range.. Temperature is normal and within the target range for the patient.Marland Kitchen appears in no distress. Vitals Time Taken: 9:40 AM, Height: 63 in, Source: Stated, Weight: 174 lbs, Source: Stated, BMI: 30.8, Temperature: 97.6 F, Pulse: 72 bpm, Respiratory Rate: 18 breaths/min, Blood Pressure: 113/75 mmHg. Cardiovascular Popliteal pulses palpable. Had trouble feeling her femoral pulse although this may have been positioning. Dorsalis pedis pulses bilaterally palpable. Posterior tibial reduced but palpable. Chronic venous changes in the bilateral lower legs. Hemosiderin on the right some degree of stasis dermatitis on the left.Marland Kitchen Psychiatric No evidence of depression, anxiety, or agitation. Calm, cooperative, and communicative. Appropriate interactions and affect.. General Notes: Wound exam; the patient has 2 small areas on the right posterior calf. Both of which were covered in an adherent surface debris. The area on the left was also small but with a better looking wound surface I used a #3 curette to remove both right calf wounds of very fibrinous adherent debris. Hemostasis with direct pressure Integumentary (Hair, Skin) Wound #1 status is Open. Original cause of wound was  Gradually Appeared. The date acquired was: 09/30/2020. The wound is located on the Right,Proximal,Posterior Lower Leg. The wound measures 2cm length x 1.4cm width x 0.1cm depth; 2.199cm^2 area and 0.22cm^3 volume. There is Fat Layer (Subcutaneous Tissue) exposed. There is no tunneling  or undermining noted. There is a medium amount of serous drainage noted. There is no granulation within the wound bed. There is a large (67-100%) amount of necrotic tissue within the wound bed including Adherent Slough. Wound #2 status is Open. Original cause of wound was Gradually Appeared. The date acquired was: 09/30/2020. The wound is located on the Right,Distal,Posterior Lower Leg. The wound measures 1cm length x 0.7cm width x 0.1cm depth; 0.55cm^2 area and 0.055cm^3 volume. There is Fat Layer (Subcutaneous Tissue) exposed. There is no tunneling or undermining noted. There is a large amount of serous drainage noted. There is no granulation within the wound bed. There is a large (67-100%) amount of necrotic tissue within the wound bed including Adherent Slough. Wound #3 status is Open. Original cause of wound was Gradually Appeared. The date acquired was: 09/30/2020. The wound is located on the Left,Posterior Lower Leg. The wound measures 0.8cm length x 0.4cm width x 0.1cm depth; 0.251cm^2 area and 0.025cm^3 volume. There is Fat RENATE, DANH B. ([id number]) Layer (Subcutaneous Tissue) exposed. There is no tunneling or undermining noted. There is a large amount of serous drainage noted. There is no granulation within the wound bed. There is a large (67-100%) amount of necrotic tissue within the wound bed including Adherent Slough. Assessment Active Problems ICD-10 Chronic venous hypertension (idiopathic) with ulcer and inflammation of bilateral lower extremity Non-pressure chronic ulcer of left calf limited to breakdown of skin Non-pressure chronic ulcer of right calf limited to breakdown of skin Procedures Wound  #1 Pre-procedure diagnosis of Wound #1 is a Venous Leg Ulcer located on the Right,Proximal,Posterior Lower Leg .Severity of Tissue Pre Debridement is: Fat layer exposed. There was a Excisional Skin/Subcutaneous Tissue Debridement with a total area of 2.8 sq cm performed by Ricard Dillon, MD. With the following instrument(s): Curette to remove Viable and Non-Viable tissue/material. Material removed includes Subcutaneous Tissue and Slough and. No specimens were taken. A time out was conducted at 10:31, prior to the start of the procedure. A Minimum amount of bleeding was controlled with Pressure. The procedure was tolerated well. Post Debridement Measurements: 2.5cm length x 1.4cm width x 0.2cm depth; 0.55cm^3 volume. Character of Wound/Ulcer Post Debridement is stable. Severity of Tissue Post Debridement is: Fat layer exposed. Post procedure Diagnosis Wound #1: Same as Pre-Procedure Wound #2 Pre-procedure diagnosis of Wound #2 is a Venous Leg Ulcer located on the Right,Distal,Posterior Lower Leg .Severity of Tissue Pre Debridement is: Fat layer exposed. There was a Excisional Skin/Subcutaneous Tissue Debridement with a total area of 0.7 sq cm performed by Ricard Dillon, MD. With the following instrument(s): Curette to remove Viable and Non-Viable tissue/material. Material removed includes Subcutaneous Tissue and Slough and. No specimens were taken. A time out was conducted at 10:31, prior to the start of the procedure. A Minimum amount of bleeding was controlled with Pressure. The procedure was tolerated well. Post Debridement Measurements: 1cm length x 0.7cm width x 0.2cm depth; 0.11cm^3 volume. Character of Wound/Ulcer Post Debridement is stable. Severity of Tissue Post Debridement is: Fat layer exposed. Post procedure Diagnosis Wound #2: Same as Pre-Procedure Plan Follow-up Appointments: Return Appointment in 1 week. Nurse Visit as needed Bathing/ Shower/ Hygiene: May shower with  wound dressing protected with water repellent cover or cast protector. Edema Control - Lymphedema / Segmental Compressive Device / Other: Optional: One layer of unna paste to top of compression wrap (to act as an anchor). 3 Layer Compression System for Lymphedema. Elevate, Exercise Daily and Avoid Standing for Long Periods  of Time. Elevate legs to the level of the heart and pump ankles as often as possible Elevate leg(s) parallel to the floor when sitting. Additional Orders / Instructions: Follow Nutritious Diet and Increase Protein Intake WOUND #1: - Lower Leg Wound Laterality: Right, Posterior, Proximal Primary Dressing: Hydrofera Blue Ready Transfer Foam, 2.5x2.5 (in/in) Discharge Instructions: Apply Hydrofera Blue Ready to wound bed as directed Secondary Dressing: Xtrasorb Medium 4x5 (in/in) Discharge Instructions: Apply to wound as directed. Do not cut. Compression Wrap: Profore Lite LF 3 Multilayer Compression Bandaging System Discharge Instructions: Apply 3 multi-layer wrap as prescribed. WOUND #2: - Lower Leg Wound Laterality: Right, Posterior, Distal Primary Dressing: Hydrofera Blue Ready Transfer Foam, 2.5x2.5 (in/in) Discharge Instructions: Apply Hydrofera Blue Ready to wound bed as directed Secondary Dressing: Xtrasorb Medium 4x5 (in/in) Discharge Instructions: Apply to wound as directed. Do not cut. KEYASIA, JOLLIFF BMarland Kitchen ([id number]) Compression Wrap: Profore Lite LF 3 Multilayer Compression Bandaging System Discharge Instructions: Apply 3 multi-layer wrap as prescribed. WOUND #3: - Lower Leg Wound Laterality: Left, Posterior Primary Dressing: Hydrofera Blue Ready Transfer Foam, 2.5x2.5 (in/in) Discharge Instructions: Apply Hydrofera Blue Ready to wound bed as directed Secondary Dressing: Xtrasorb Medium 4x5 (in/in) Discharge Instructions: Apply to wound as directed. Do not cut. Compression Wrap: Profore Lite LF 3 Multilayer Compression Bandaging System Discharge Instructions:  Apply 3 multi-layer wrap as prescribed. 1. I am going to use Hydrofera Blue on all the wounds, drawtex under 3 layer compression bilaterally 2. Although she had noncompressible ABIs her pulses are palpable I think she should be able to tolerate 3 layer compression 3. She has chronic venous insufficiency and she has had wounds since apparently last summer she is going to need stockings after these healed and we had some time talking about this apparently she has trouble putting them on and dislikes the tightness. 4. There is complaining of some leaking fluid out of the wound areas. I think with 3 layer compression Hydrofera Blue and backing drawtex there should not be in need to change this until we see her next week. I am not opposed to home health if that is required. Electronic Signature(s) Signed: 12/06/2020 2:54:56 PM By: Linton Ham MD Entered By: Linton Ham on 12/05/2020 11:19:38 [name] [name] ([id number]) -------------------------------------------------------------------------------- ROS/PFSH Details Patient Name: [name] [name] Date of Service: 12/05/2020 9:30 AM Medical Record Number: [id number] Patient Account Number: [id number] Date of Birth/Sex: 02-05-1927 (87 y.o. F) Treating RN: Dolan Amen Primary Care Provider: Tracie Harrier Other Clinician: Referring Provider: Referral, Self Treating Provider/Extender: Tito Dine in Treatment: 0 Information Obtained From Patient Constitutional Symptoms (General Health) Complaints and Symptoms: Negative for: Fatigue; Fever; Chills; Marked Weight Change Eyes Complaints and Symptoms: Negative for: Dry Eyes; Vision Changes; Glasses / Contacts Medical History: Negative for: Cataracts; Glaucoma; Optic Neuritis Ear/Nose/Mouth/Throat Complaints and Symptoms: Negative for: Difficult clearing ears; Sinusitis Medical History: Negative for: Chronic sinus problems/congestion; Middle ear  problems Hematologic/Lymphatic Complaints and Symptoms: Negative for: Bleeding / Clotting Disorders; Human Immunodeficiency Virus Medical History: Negative for: Anemia; Hemophilia; Human Immunodeficiency Virus; Lymphedema; Sickle Cell Disease Respiratory Complaints and Symptoms: Negative for: Chronic or frequent coughs; Shortness of Breath Medical History: Positive for: Chronic Obstructive Pulmonary Disease (COPD) Negative for: Aspiration; Asthma; Pneumothorax; Sleep Apnea; Tuberculosis Cardiovascular Complaints and Symptoms: Positive for: LE edema Negative for: Chest pain Medical History: Positive for: Arrhythmia; Congestive Heart Failure; Hypertension Negative for: Angina; Coronary Artery Disease; Deep Vein Thrombosis; Hypotension; Myocardial Infarction; Peripheral Arterial Disease; Peripheral Venous Disease; Phlebitis; Vasculitis Gastrointestinal Complaints and Symptoms:  Negative for: Frequent diarrhea; Nausea; Vomiting Medical History: Negative for: Cirrhosis ; Colitis; Crohnos; Hepatitis A; Hepatitis B; Hepatitis C Endocrine SIRINITY, OUTLAND B. ([id number]) Complaints and Symptoms: Negative for: Hepatitis; Thyroid disease; Polydypsia (Excessive Thirst) Medical History: Negative for: Type I Diabetes Genitourinary Complaints and Symptoms: Negative for: Kidney failure/ Dialysis; Incontinence/dribbling Medical History: Negative for: End Stage Renal Disease Immunological Complaints and Symptoms: Negative for: Hives; Itching Medical History: Negative for: Lupus Erythematosus; Raynaudos; Scleroderma Integumentary (Skin) Complaints and Symptoms: Positive for: Wounds; Bleeding or bruising tendency; Breakdown; Swelling Medical History: Negative for: History of Burn; History of pressure wounds Musculoskeletal Complaints and Symptoms: Negative for: Muscle Pain; Muscle Weakness Medical History: Negative for: Gout; Rheumatoid Arthritis; Osteoarthritis;  Osteomyelitis Neurologic Complaints and Symptoms: Negative for: Numbness/parasthesias; Focal/Weakness Medical History: Negative for: Dementia; Neuropathy; Quadriplegia; Paraplegia; Seizure Disorder Psychiatric Complaints and Symptoms: Negative for: Anxiety; Claustrophobia Medical History: Negative for: Anorexia/bulimia; Confinement Anxiety Oncologic Medical History: Positive for: Received Chemotherapy - chemo pills Negative for: Received Radiation Past Medical History Notes: mastectomy-1976 Immunizations Pneumococcal Vaccine: Received Pneumococcal Vaccination: Yes Implantable Devices Yes NINAH, MOCCIO ([id number]) Family and Social History Former smoker; Alcohol Use: Never; Drug Use: No History; Caffeine Use: Moderate Electronic Signature(s) Signed: 12/05/2020 5:04:06 PM By: Georges Mouse, Minus Breeding RN Signed: 12/06/2020 2:54:56 PM By: Linton Ham MD Entered By: Georges Mouse, Minus Breeding on 12/05/2020 09:50:03 [name] [name] ([id number]) -------------------------------------------------------------------------------- SuperBill Details Patient Name: [name] [name] Date of Service: 12/05/2020 Medical Record Number: [id number] Patient Account Number: [id number] Date of Birth/Sex: Jul 15, 1927 (87 y.o. F) Treating RN: Cornell Barman Primary Care Provider: Tracie Harrier Other Clinician: Referring Provider: Referral, Self Treating Provider/Extender: Tito Dine in Treatment: 0 Diagnosis Coding ICD-10 Codes Code Description I87.333 Chronic venous hypertension (idiopathic) with ulcer and inflammation of bilateral lower extremity L97.221 Non-pressure chronic ulcer of left calf limited to breakdown of skin L97.211 Non-pressure chronic ulcer of right calf limited to breakdown of skin Facility Procedures CPT4 Code: 46270350 Description: [facility] VISIT-LEV 3 EST PT Modifier: Quantity: 1 CPT4 Code: 09381829 Description: 93716 - DEB SUBQ TISSUE 20 SQ  CM/< Modifier: Quantity: 1 CPT4 Code: Description: ICD-10 Diagnosis Description L97.211 Non-pressure chronic ulcer of right calf limited to breakdown of skin Modifier: Quantity: Physician Procedures CPT4 Code Description: 9678938 WC PHYS LEVEL 3 o NEW PT Modifier: Quantity: 1 CPT4 Code Description: ICD-10 Diagnosis Description I87.333 Chronic venous hypertension (idiopathic) with ulcer and inflammation of bila L97.221 Non-pressure chronic ulcer of left calf limited to breakdown of skin L97.211 Non-pressure chronic ulcer of  right calf limited to breakdown of skin Modifier: teral lower extre Quantity: mity CPT4 Code Description: [id number] - WC PHYS SUBQ TISS 20 SQ CM Modifier: Quantity: 1 CPT4 Code Description: ICD-10 Diagnosis Description L97.211 Non-pressure chronic ulcer of right calf limited to breakdown of skin Modifier: Quantity: Electronic Signature(s) Signed: 12/06/2020 2:54:56 PM By: Linton Ham MD Entered By: Linton Ham on 12/05/2020 11:20:08

## 2020-12-06 NOTE — Progress Notes (Signed)
RAILEE, BONILLAS ([id number]) Visit Report for 12/05/2020 Allergy List Details Patient Name: [name] [name], [name] [name]. Date of Service: 12/05/2020 9:30 AM Medical Record Number: [id number] Patient Account Number: [id number] Date of Birth/Sex: [birth year] (87 y.o. F) Treating RN: Dolan Amen Primary Care Holly Iannaccone: Tracie Harrier Other Clinician: Referring Kamareon Sciandra: Referral, Self Treating Novah Goza/Extender: Ricard Dillon Weeks in Treatment: 0 Allergies Active Allergies penicillin amoxicillin Beta-Blockers (Beta-Adrenergic Blocking Agts) meloxicam naproxen NSAIDS (Non-Steroidal Anti-Inflammatory Drug) Allergy Notes Electronic Signature(s) Signed: 12/05/2020 5:04:06 PM By: Georges Mouse, Minus Breeding RN Entered By: Georges Mouse, Minus Breeding on 12/05/2020 09:43:36 [name] [name] ([id number]) -------------------------------------------------------------------------------- Arrival Information Details Patient Name: [name] [name] Date of Service: 12/05/2020 9:30 AM Medical Record Number: [id number] Patient Account Number: [id number] Date of Birth/Sex: 07-02-1927 (87 y.o. F) Treating RN: Dolan Amen Primary Care Careem Yasui: Tracie Harrier Other Clinician: Referring Jerrik Housholder: Referral, Self Treating Kamika Goodloe/Extender: Tito Dine in Treatment: 0 Visit Information Patient Arrived: Cane Arrival Time: 09:36 Accompanied By: daughter Transfer Assistance: None Patient Identification Verified: Yes Secondary Verification Process Completed: Yes Patient Has Alerts: Yes Patient Alerts: Patient on Blood Thinner warfarin NON COMPRESSIBLE L and R NOT DIABETIC Electronic Signature(s) Signed: 12/05/2020 6:32:03 PM By: Gretta Cool, BSN, RN, CWS, Kim RN, BSN Entered By: Gretta Cool, BSN, RN, CWS, Kim on 12/05/2020 10:29:35 [name] [name] ([id number]) -------------------------------------------------------------------------------- Clinic Level of Care Assessment Details Patient Name: [name] [name], [name] [name]. Date of Service: 12/05/2020 9:30 AM Medical Record Number: [id number] Patient Account Number: [id number] Date of Birth/Sex: 10-07-1926 (87 y.o. F) Treating RN: Cornell Barman Primary Care Keileigh Vahey: Tracie Harrier Other Clinician: Referring Gabreille Dardis: Referral, Self Treating Helaman Mecca/Extender: Tito Dine in Treatment: 0 Clinic Level of Care Assessment Items TOOL 1 Quantity Score []  - Use when EandM and Procedure is performed on INITIAL visit 0 ASSESSMENTS - Nursing Assessment / Reassessment X - General Physical Exam (combine w/ comprehensive assessment (listed just below) when performed on new 1 20 pt. evals) X- 1 25 Comprehensive Assessment (HX, ROS, Risk Assessments, Wounds Hx, etc.) ASSESSMENTS - Wound and Skin Assessment / Reassessment []  - Dermatologic / Skin Assessment (not related to wound area) 0 ASSESSMENTS - Ostomy and/or Continence Assessment and Care []  - Incontinence Assessment and Management 0 []  - 0 Ostomy Care Assessment and Management (repouching, etc.) PROCESS - Coordination of Care X - Simple Patient / Family Education for ongoing care 1 15 []  - 0 Complex (extensive) Patient / Family Education for ongoing care X- 1 10 Staff obtains Programmer, systems, Records, Test Results / Process Orders []  - 0 Staff telephones HHA, Nursing Homes / Clarify orders / etc []  - 0 Routine Transfer to another Facility (non-emergent condition) []  - 0 Routine Hospital Admission (non-emergent condition) X- 1 15 New Admissions / Biomedical engineer / Ordering NPWT, Apligraf, etc. []  - 0 Emergency Hospital Admission (emergent condition) PROCESS - Special Needs []  - Pediatric / Minor Patient Management 0 []  - 0 Isolation Patient Management []  - 0 Hearing / Language / Visual special needs []  - 0 Assessment of Community assistance (transportation, D/C planning, etc.) []  - 0 Additional assistance / Altered mentation []  - 0 Support Surface(s) Assessment (bed, cushion, seat,  etc.) INTERVENTIONS - Miscellaneous []  - External ear exam 0 []  - 0 Patient Transfer (multiple staff / Civil Service fast streamer / Similar devices) []  - 0 Simple Staple / Suture removal (25 or less) []  - 0 Complex Staple / Suture removal (26 or more) []  - 0 Hypo/Hyperglycemic Management (do not check if billed separately) X- 1 15 Ankle / Brachial  Index (ABI) - do not check if billed separately Has the patient been seen at the hospital within the last three years: Yes Total Score: 100 Level Of Care: New/Established - Level 3 [name] [name], [name] [name] ([id number]) Electronic Signature(s) Signed: 12/05/2020 6:32:03 PM By: Gretta Cool, BSN, RN, CWS, Kim RN, BSN Entered By: Gretta Cool, BSN, RN, CWS, Kim on 12/05/2020 10:37:53 [name] [name] ([id number]) -------------------------------------------------------------------------------- Encounter Discharge Information Details Patient Name: [name] [name], [name] [name]. Date of Service: 12/05/2020 9:30 AM Medical Record Number: [id number] Patient Account Number: [id number] Date of Birth/Sex: 1926-12-16 (87 y.o. F) Treating RN: Cornell Barman Primary Care Correy Weidner: Tracie Harrier Other Clinician: Referring Sanora Cunanan: Referral, Self Treating Zykia Walla/Extender: Tito Dine in Treatment: 0 Encounter Discharge Information Items Post Procedure Vitals Discharge Condition: Stable Temperature (F): 97.6 Ambulatory Status: Ambulatory Pulse (bpm): 72 Discharge Destination: Home Respiratory Rate (breaths/min): 18 Transportation: Private Auto Blood Pressure (mmHg): 113/75 Accompanied By: daughter Schedule Follow-up Appointment: Yes Clinical Summary of Care: Electronic Signature(s) Signed: 12/05/2020 6:32:03 PM By: Gretta Cool, BSN, RN, CWS, Kim RN, BSN Entered By: Gretta Cool, BSN, RN, CWS, Kim on 12/05/2020 10:39:38 [name] [name] ([id number]) -------------------------------------------------------------------------------- Lower Extremity Assessment Details Patient Name: [name] [name], [name]  B. Date of Service: 12/05/2020 9:30 AM Medical Record Number: [id number] Patient Account Number: [id number] Date of Birth/Sex: 1927/03/16 (87 y.o. F) Treating RN: Dolan Amen Primary Care Hadleigh Felber: Tracie Harrier Other Clinician: Referring Chalise Pe: Referral, Self Treating Sigurd Pugh/Extender: Ricard Dillon Weeks in Treatment: 0 Edema Assessment Assessed: [Left: Yes] [Right: Yes] Edema: [Left: Yes] [Right: Yes] Calf Left: Right: Point of Measurement: 31 cm From Medial Instep 35.5 cm 35 cm Ankle Left: Right: Point of Measurement: 11 cm From Medial Instep 20 cm 20 cm Knee To Floor Left: Right: From Medial Instep 40 cm 40 cm Vascular Assessment Pulses: Dorsalis Pedis Palpable: [Left:Yes] [Right:Yes] Electronic Signature(s) Signed: 12/05/2020 5:04:06 PM By: Georges Mouse, Minus Breeding RN Entered By: Georges Mouse, Minus Breeding on 12/05/2020 10:16:14 [name] [name] ([id number]) -------------------------------------------------------------------------------- Multi Wound Chart Details Patient Name: [name] [name] Date of Service: 12/05/2020 9:30 AM Medical Record Number: [id number] Patient Account Number: [id number] Date of Birth/Sex: [birth year] (87 y.o. F) Treating RN: Cornell Barman Primary Care Roddy Bellamy: Tracie Harrier Other Clinician: Referring Osa Campoli: Referral, Self Treating Rhea Kaelin/Extender: Tito Dine in Treatment: 0 Vital Signs Height(in): 31 Pulse(bpm): 72 Weight(lbs): 174 Blood Pressure(mmHg): 113/75 Body Mass Index(BMI): 31 Temperature(F): 97.6 Respiratory Rate(breaths/min): 18 Photos: Wound Location: Right, Proximal, Posterior Lower Right, Distal, Posterior Lower Leg Left, Posterior Lower Leg Leg Wounding Event: Gradually Appeared Gradually Appeared Gradually Appeared Primary Etiology: Venous Leg Ulcer Venous Leg Ulcer Venous Leg Ulcer Comorbid History: Chronic Obstructive Pulmonary Chronic Obstructive Pulmonary Chronic Obstructive Pulmonary Disease  (COPD), Arrhythmia, Disease (COPD), Arrhythmia, Disease (COPD), Arrhythmia, Congestive Heart Failure, Congestive Heart Failure, Congestive Heart Failure, Hypertension, Received Hypertension, Received Hypertension, Received Chemotherapy Chemotherapy Chemotherapy Date Acquired: 09/30/2020 09/30/2020 09/30/2020 Weeks of Treatment: 0 0 0 Wound Status: Open Open Open Measurements L x W x D (cm) 2x1.4x0.1 1x0.7x0.1 0.8x0.4x0.1 Area (cm) : 2.199 0.55 0.251 Volume (cm) : 0.22 0.055 0.025 % Reduction in Area: 0.00% 0.00% 0.00% % Reduction in Volume: 0.00% 0.00% 0.00% Classification: Full Thickness Without Exposed Full Thickness Without Exposed Full Thickness Without Exposed Support Structures Support Structures Support Structures Exudate Amount: Medium Large Large Exudate Type: Serous Serous Serous Exudate Color: amber Physiological scientist Granulation Amount: None Present (0%) None Present (0%) None Present (0%) Necrotic Amount: Large (67-100%) Large (67-100%) Large (67-100%) Exposed Structures: Fat Layer (Subcutaneous Tissue): Fat Layer (Subcutaneous Tissue): Fat Layer (  Subcutaneous Tissue): Yes Yes Yes Fascia: No Fascia: No Fascia: No Tendon: No Tendon: No Tendon: No Muscle: No Muscle: No Muscle: No Joint: No Joint: No Joint: No Bone: No Bone: No Bone: No Epithelialization: None None None Treatment Notes Electronic Signature(s) Signed: 12/05/2020 6:32:03 PM By: Gretta Cool, BSN, RN, CWS, Kim RN, BSN Entered By: Gretta Cool, BSN, RN, CWS, Kim on 12/05/2020 10:30:38 [name] [name] ([id number]) -------------------------------------------------------------------------------- [place] Plan Details Patient Name: [name] [name] Date of Service: 12/05/2020 9:30 AM Medical Record Number: [id number] Patient Account Number: [id number] Date of Birth/Sex: Jun 21, 1927 (87 y.o. F) Treating RN: Cornell Barman Primary Care Laynee Lockamy: Tracie Harrier Other Clinician: Referring Naiara Lombardozzi: Referral,  Self Treating Ander Wamser/Extender: Tito Dine in Treatment: 0 Active Inactive Necrotic Tissue Nursing Diagnoses: Impaired tissue integrity related to necrotic/devitalized tissue Goals: Necrotic/devitalized tissue will be minimized in the wound bed Date Initiated: 12/05/2020 Target Resolution Date: 12/19/2020 Goal Status: Active Interventions: Assess patient pain level pre-, during and post procedure and prior to discharge Treatment Activities: Apply topical anesthetic as ordered : 12/05/2020 Notes: Orientation to the Wound Care Program Nursing Diagnoses: Knowledge deficit related to the wound healing center program Goals: Patient/caregiver will verbalize understanding of the [place] Date Initiated: 12/05/2020 Target Resolution Date: 12/19/2020 Goal Status: Active Interventions: Provide education on orientation to the wound center Notes: Venous Leg Ulcer Nursing Diagnoses: Actual venous Insuffiency (use after diagnosis is confirmed) Goals: Patient will maintain optimal edema control Date Initiated: 12/05/2020 Target Resolution Date: 12/19/2020 Goal Status: Active Interventions: Assess peripheral edema status every visit. Compression as ordered Provide education on venous insufficiency Treatment Activities: Therapeutic compression applied : 12/05/2020 Notes: Wound/Skin Impairment DESHANDA, MOLITOR ([id number]) Nursing Diagnoses: Impaired tissue integrity Goals: Patient/caregiver will verbalize understanding of skin care regimen Date Initiated: 12/05/2020 Target Resolution Date: 12/05/2020 Goal Status: Active Ulcer/skin breakdown will have a volume reduction of 30% by week 4 Date Initiated: 12/05/2020 Target Resolution Date: 01/05/2021 Goal Status: Active Interventions: Assess patient/caregiver ability to perform ulcer/skin care regimen upon admission and as needed Assess ulceration(s) every visit Treatment Activities: Skin care regimen  initiated : 12/05/2020 Topical wound management initiated : 12/05/2020 Notes: Electronic Signature(s) Signed: 12/05/2020 6:32:03 PM By: Gretta Cool, BSN, RN, CWS, Kim RN, BSN Entered By: Gretta Cool, BSN, RN, CWS, Kim on 12/05/2020 10:27:09 [name] [name] ([id number]) -------------------------------------------------------------------------------- Pain Assessment Details Patient Name: [name] [name], [name] B. Date of Service: 12/05/2020 9:30 AM Medical Record Number: [id number] Patient Account Number: [id number] Date of Birth/Sex: Jan 25, 1927 (87 y.o. F) Treating RN: Dolan Amen Primary Care Shavy Beachem: Tracie Harrier Other Clinician: Referring Zadyn Yardley: Referral, Self Treating Krissi Willaims/Extender: Tito Dine in Treatment: 0 Active Problems Location of Pain Severity and Description of Pain Patient Has Paino No Site Locations Rate the pain. Current Pain Level: 0 Pain Management and Medication Current Pain Management: Electronic Signature(s) Signed: 12/05/2020 5:04:06 PM By: Georges Mouse, Minus Breeding RN Entered By: Georges Mouse, Kenia on 12/05/2020 09:40:41 [name] [name] ([id number]) -------------------------------------------------------------------------------- Patient/Caregiver Education Details Patient Name: [name] [name] Date of Service: 12/05/2020 9:30 AM Medical Record Number: [id number] Patient Account Number: [id number] Date of Birth/Gender: 25-Mar-1927 (87 y.o. F) Treating RN: Cornell Barman Primary Care Physician: Tracie Harrier Other Clinician: Referring Physician: Referral, Self Treating Physician/Extender: Tito Dine in Treatment: 0 Education Assessment Education Provided To: Patient Education Topics Provided Venous: Handouts: Controlling Swelling with Multilayered Compression Wraps Methods: Demonstration, Explain/Verbal Responses: State content correctly Welcome To [facility]: Handouts: Welcome To [facility] Methods: Demonstration,  Explain/Verbal Responses:  State content correctly Wound/Skin Impairment: Handouts: Caring for Your Ulcer Methods: Demonstration, Explain/Verbal Responses: State content correctly Electronic Signature(s) Signed: 12/05/2020 6:32:03 PM By: Gretta Cool, BSN, RN, CWS, Kim RN, BSN Entered By: Gretta Cool, BSN, RN, CWS, Kim on 12/05/2020 10:38:35 [name] [name] ([id number]) -------------------------------------------------------------------------------- Wound Assessment Details Patient Name: [name] [name], [name] [name] Date of Service: 12/05/2020 9:30 AM Medical Record Number: [id number] Patient Account Number: [id number] Date of Birth/Sex: 15-Feb-1927 (87 y.o. F) Treating RN: Dolan Amen Primary Care Ayub Kirsh: Tracie Harrier Other Clinician: Referring Tylea Hise: Referral, Self Treating Daundre Biel/Extender: Ricard Dillon Weeks in Treatment: 0 Wound Status Wound Number: 1 Primary Venous Leg Ulcer Etiology: Wound Location: Right, Proximal, Posterior Lower Leg Wound Open Wounding Event: Gradually Appeared Status: Date Acquired: 09/30/2020 Comorbid Chronic Obstructive Pulmonary Disease (COPD), Weeks Of Treatment: 0 History: Arrhythmia, Congestive Heart Failure, Hypertension, Clustered Wound: No Received Chemotherapy Photos Wound Measurements Length: (cm) 2 Width: (cm) 1.4 Depth: (cm) 0.1 Area: (cm) 2.199 Volume: (cm) 0.22 % Reduction in Area: 0% % Reduction in Volume: 0% Epithelialization: None Tunneling: No Undermining: No Wound Description Classification: Full Thickness Without Exposed Support Structu Exudate Amount: Medium Exudate Type: Serous Exudate Color: amber res Foul Odor After Cleansing: No Slough/Fibrino Yes Wound Bed Granulation Amount: None Present (0%) Exposed Structure Necrotic Amount: Large (67-100%) Fascia Exposed: No Necrotic Quality: Adherent Slough Fat Layer (Subcutaneous Tissue) Exposed: Yes Tendon Exposed: No Muscle Exposed: No Joint Exposed: No Bone Exposed:  No Treatment Notes Wound #1 (Lower Leg) Wound Laterality: Right, Posterior, Proximal Cleanser Peri-Wound Care Topical Primary Dressing MADESYN, AST ([id number]) Hydrofera Blue Ready Transfer Foam, 2.5x2.5 (in/in) Discharge Instruction: Apply Hydrofera Blue Ready to wound bed as directed Secondary Dressing Xtrasorb Medium 4x5 (in/in) Discharge Instruction: Apply to wound as directed. Do not cut. Secured With Compression Wrap Profore Lite LF 3 Multilayer Compression Bandaging System Discharge Instruction: Apply 3 multi-layer wrap as prescribed. Compression Stockings Add-Ons Electronic Signature(s) Signed: 12/05/2020 5:04:06 PM By: Georges Mouse, Minus Breeding RN Entered By: Georges Mouse, Minus Breeding on 12/05/2020 10:06:34 [name] [name] ([id number]) -------------------------------------------------------------------------------- Wound Assessment Details Patient Name: [name] [name] Date of Service: 12/05/2020 9:30 AM Medical Record Number: [id number] Patient Account Number: [id number] Date of Birth/Sex: Aug 09, 1927 (87 y.o. F) Treating RN: Dolan Amen Primary Care Atia Haupt: Tracie Harrier Other Clinician: Referring Afton Mikelson: Referral, Self Treating Dwayne Bulkley/Extender: Ricard Dillon Weeks in Treatment: 0 Wound Status Wound Number: 2 Primary Venous Leg Ulcer Etiology: Wound Location: Right, Distal, Posterior Lower Leg Wound Open Wounding Event: Gradually Appeared Status: Date Acquired: 09/30/2020 Comorbid Chronic Obstructive Pulmonary Disease (COPD), Weeks Of Treatment: 0 History: Arrhythmia, Congestive Heart Failure, Hypertension, Clustered Wound: No Received Chemotherapy Photos Wound Measurements Length: (cm) 1 Width: (cm) 0.7 Depth: (cm) 0.1 Area: (cm) 0.55 Volume: (cm) 0.055 % Reduction in Area: 0% % Reduction in Volume: 0% Epithelialization: None Tunneling: No Undermining: No Wound Description Classification: Full Thickness Without Exposed Support  Structu Exudate Amount: Large Exudate Type: Serous Exudate Color: amber res Foul Odor After Cleansing: No Slough/Fibrino Yes Wound Bed Granulation Amount: None Present (0%) Exposed Structure Necrotic Amount: Large (67-100%) Fascia Exposed: No Necrotic Quality: Adherent Slough Fat Layer (Subcutaneous Tissue) Exposed: Yes Tendon Exposed: No Muscle Exposed: No Joint Exposed: No Bone Exposed: No Treatment Notes Wound #2 (Lower Leg) Wound Laterality: Right, Posterior, Distal Cleanser Peri-Wound Care Topical Primary Dressing [name] [name], [name] [name] ([id number]) Hydrofera Blue Ready Transfer Foam, 2.5x2.5 (in/in) Discharge Instruction: Apply Hydrofera Blue Ready to wound bed as directed Secondary Dressing Xtrasorb Medium 4x5 (in/in) Discharge Instruction: Apply to wound as directed.  Do not cut. Secured With Compression Wrap Profore Lite LF 3 Multilayer Compression Bandaging System Discharge Instruction: Apply 3 multi-layer wrap as prescribed. Compression Stockings Add-Ons Electronic Signature(s) Signed: 12/05/2020 5:04:06 PM By: Georges Mouse, Minus Breeding RN Entered By: Georges Mouse, Minus Breeding on 12/05/2020 10:07:09 [name] [name] ([id number]) -------------------------------------------------------------------------------- Wound Assessment Details Patient Name: [name] [name] Date of Service: 12/05/2020 9:30 AM Medical Record Number: [id number] Patient Account Number: [id number] Date of Birth/Sex: 03/25/1927 (87 y.o. F) Treating RN: Dolan Amen Primary Care Jazell Rosenau: Tracie Harrier Other Clinician: Referring Ariana Cavenaugh: Referral, Self Treating Taraneh Metheney/Extender: Ricard Dillon Weeks in Treatment: 0 Wound Status Wound Number: 3 Primary Venous Leg Ulcer Etiology: Wound Location: Left, Posterior Lower Leg Wound Open Wounding Event: Gradually Appeared Status: Date Acquired: 09/30/2020 Comorbid Chronic Obstructive Pulmonary Disease (COPD), Weeks Of Treatment: 0 History:  Arrhythmia, Congestive Heart Failure, Hypertension, Clustered Wound: No Received Chemotherapy Photos Wound Measurements Length: (cm) 0.8 Width: (cm) 0.4 Depth: (cm) 0.1 Area: (cm) 0.251 Volume: (cm) 0.025 % Reduction in Area: 0% % Reduction in Volume: 0% Epithelialization: None Tunneling: No Undermining: No Wound Description Classification: Full Thickness Without Exposed Support Structu Exudate Amount: Large Exudate Type: Serous Exudate Color: amber res Foul Odor After Cleansing: No Slough/Fibrino Yes Wound Bed Granulation Amount: None Present (0%) Exposed Structure Necrotic Amount: Large (67-100%) Fascia Exposed: No Necrotic Quality: Adherent Slough Fat Layer (Subcutaneous Tissue) Exposed: Yes Tendon Exposed: No Muscle Exposed: No Joint Exposed: No Bone Exposed: No Treatment Notes Wound #3 (Lower Leg) Wound Laterality: Left, Posterior Cleanser Peri-Wound Care Topical Primary Dressing [name] [name], [name] [name] ([id number]) Hydrofera Blue Ready Transfer Foam, 2.5x2.5 (in/in) Discharge Instruction: Apply Hydrofera Blue Ready to wound bed as directed Secondary Dressing Xtrasorb Medium 4x5 (in/in) Discharge Instruction: Apply to wound as directed. Do not cut. Secured With Compression Wrap Profore Lite LF 3 Multilayer Compression Bandaging System Discharge Instruction: Apply 3 multi-layer wrap as prescribed. Compression Stockings Add-Ons Electronic Signature(s) Signed: 12/05/2020 5:04:06 PM By: Georges Mouse, Minus Breeding RN Entered By: Georges Mouse, Minus Breeding on 12/05/2020 10:07:41 [name] [name] ([id number]) -------------------------------------------------------------------------------- Vitals Details Patient Name: [name] [name] Date of Service: 12/05/2020 9:30 AM Medical Record Number: [id number] Patient Account Number: [id number] Date of Birth/Sex: Jul 10, 1927 (87 y.o. F) Treating RN: Dolan Amen Primary Care Boston Cookson: Tracie Harrier Other Clinician: Referring  Era Parr: Referral, Self Treating Keaton Beichner/Extender: Tito Dine in Treatment: 0 Vital Signs Time Taken: 09:40 Temperature (F): 97.6 Height (in): 63 Pulse (bpm): 72 Source: Stated Respiratory Rate (breaths/min): 18 Weight (lbs): 174 Blood Pressure (mmHg): 113/75 Source: Stated Reference Range: 80 - 120 mg / dl Body Mass Index (BMI): 30.8 Electronic Signature(s) Signed: 12/05/2020 5:04:06 PM By: Georges Mouse, Minus Breeding RN Entered By: Georges Mouse, Minus Breeding on 12/05/2020 09:41:20

## 2020-12-07 ENCOUNTER — Other Ambulatory Visit: Payer: Self-pay

## 2020-12-07 DIAGNOSIS — I87333 Chronic venous hypertension (idiopathic) with ulcer and inflammation of bilateral lower extremity: Secondary | ICD-10-CM | POA: Diagnosis not present

## 2020-12-07 NOTE — Progress Notes (Addendum)
[name] [name] ([id number]) Visit Report for 12/07/2020 Arrival Information Details Patient Name: [name] [name], [name] [name]. Date of Service: 12/07/2020 9:15 AM Medical Record Number: [id number] Patient Account Number: [id number] Date of Birth/Sex: Mar 06, 1927 (87 y.o. F) Treating RN: Cornell Barman Primary Care Makeda Peeks: Tracie Harrier Other Clinician: Jeanine Luz Referring Melvyn Hommes: Tracie Harrier Treating Shelbee Apgar/Extender: Skipper Cliche in Treatment: 0 Visit Information History Since Last Visit Has Dressing in Place as Prescribed: Yes Patient Arrived: Ambulatory Has Compression in Place as Prescribed: Yes Arrival Time: 09:24 Pain Present Now: No Accompanied By: daughter Transfer Assistance: None Patient Identification Verified: Yes Secondary Verification Process Completed: Yes Patient Has Alerts: Yes Patient Alerts: Patient on Blood Thinner warfarin NON COMPRESSIBLE L and R NOT DIABETIC Electronic Signature(s) Signed: 12/07/2020 5:23:33 PM By: Gretta Cool, BSN, RN, CWS, Kim RN, BSN Previous Signature: 12/07/2020 5:20:30 PM Version By: Gretta Cool, BSN, RN, CWS, Kim RN, BSN Entered By: Gretta Cool, BSN, RN, CWS, Kim on 12/07/2020 17:23:33 [name] [name] ([id number]) -------------------------------------------------------------------------------- Compression Therapy Details Patient Name: [name] [name] Date of Service: 12/07/2020 9:15 AM Medical Record Number: [id number] Patient Account Number: [id number] Date of Birth/Sex: December 27, 1926 (87 y.o. F) Treating RN: Cornell Barman Primary Care Christofer Shen: Tracie Harrier Other Clinician: Jeanine Luz Referring Ahlani Wickes: Tracie Harrier Treating Verneice Caspers/Extender: Skipper Cliche in Treatment: 0 Compression Therapy Performed for Wound Assessment: Wound #2 Right,Distal,Posterior Lower Leg Performed By: Clinician Cornell Barman, RN Compression Type: Three Hydrologist) Signed: 12/07/2020 5:24:16 PM By: Gretta Cool, BSN, RN, CWS, Kim RN,  BSN Entered By: Gretta Cool, BSN, RN, CWS, Kim on 12/07/2020 17:24:16 [name] [name] ([id number]) -------------------------------------------------------------------------------- Compression Therapy Details Patient Name: [name], [name] Date of Service: 12/07/2020 9:15 AM Medical Record Number: [id number] Patient Account Number: [id number] Date of Birth/Sex: [birth year] (87 y.o. F) Treating RN: Cornell Barman Primary Care [name]: Tracie Harrier Other Clinician: Jeanine Luz Referring Walta Bell[place]: Tracie Harrier Treating Gabryela Kimbrell/Extender: Skipper Cliche in Treatment: 0 Compression Therapy Performed for Wound Assessment: Wound #1 Right,Proximal,Posterior Lower Leg Performed By: Clinician Cornell Barman, RN Compression Type: Three Hydrologist) Signed: 12/07/2020 5:24:16 PM By: Gretta Cool, BSN, RN, CWS, Kim RN, BSN Entered By: Gretta Cool, BSN, RN, CWS, Kim on 12/07/2020 17:24:16 [name] [name] ([id number]) -------------------------------------------------------------------------------- Compression Therapy Details Patient Name: [name], [name] Date of Service: 12/07/2020 9:15 AM Medical Record Number: [id number] Patient Account Number: [id number] Date of Birth/Sex: 1927/08/31 (87 y.o. F) Treating RN: Cornell Barman Primary Care Perian Tedder: Tracie Harrier Other Clinician: Jeanine Luz Referring Zubair Lofton: Tracie Harrier Treating Norwood Quezada/Extender: Skipper Cliche in Treatment: 0 Compression Therapy Performed for Wound Assessment: Wound #3 Left,Posterior Lower Leg Performed By: Clinician Cornell Barman, RN Compression Type: Three Hydrologist) Signed: 12/07/2020 5:24:16 PM By: Gretta Cool, BSN, RN, CWS, Kim RN, BSN Entered By: Gretta Cool, BSN, RN, CWS, Kim on 12/07/2020 17:24:16 [name] [name] ([id number]) -------------------------------------------------------------------------------- Encounter Discharge Information Details Patient Name: [name], [name]. Date of Service:  12/07/2020 9:15 AM Medical Record Number: [id number] Patient Account Number: [id number] Date of Birth/Sex: May 30, 1927 (87 y.o. F) Treating RN: Cornell Barman Primary Care Taleen Prosser: Tracie Harrier Other Clinician: Jeanine Luz Referring Camil Wilhelmsen: Tracie Harrier Treating Jarom Govan/Extender: Skipper Cliche in Treatment: 0 Encounter Discharge Information Items Discharge Condition: Stable Discharge Destination: Home Transportation: Private Auto Accompanied By: daughter Schedule Follow-up Appointment: Yes Clinical Summary of Care: Electronic Signature(s) Signed: 12/07/2020 5:24:50 PM By: Gretta Cool, BSN, RN, CWS, Kim RN, BSN Entered By: Gretta Cool, BSN, RN, CWS, Kim on 12/07/2020 17:24:50 [name] [name] ([id number]) -------------------------------------------------------------------------------- Wound Assessment Details Patient Name: [name] [name] Date of  Service: 12/07/2020 9:15 AM Medical Record Number: [id number] Patient Account Number: [id number] Date of Birth/Sex: 1927-05-19 (87 y.o. F) Treating RN: Cornell Barman Primary Care Collette Pescador: Tracie Harrier Other Clinician: Jeanine Luz Referring Barnet Benavides: Tracie Harrier Treating Chaise Passarella/Extender: Skipper Cliche in Treatment: 0 Wound Status Wound Number: 1 Primary Etiology: Venous Leg Ulcer Wound Location: Right, Proximal, Posterior Lower Leg Wound Status: Open Wounding Event: Gradually Appeared Date Acquired: 09/30/2020 Weeks Of Treatment: 0 Clustered Wound: No Wound Measurements Length: (cm) 2 Width: (cm) 1.4 Depth: (cm) 0.1 Area: (cm) 2.199 Volume: (cm) 0.22 % Reduction in Area: 0% % Reduction in Volume: 0% Wound Description Classification: Full Thickness Without Exposed Support Structure s Treatment Notes Wound #1 (Lower Leg) Wound Laterality: Right, Posterior, Proximal Cleanser Peri-Wound Care Topical Primary Dressing Hydrofera Blue Ready Transfer Foam, 2.5x2.5 (in/in) Discharge Instruction: Apply Hydrofera  Blue Ready to wound bed as directed Secondary Dressing Xtrasorb Medium 4x5 (in/in) Discharge Instruction: Apply to wound as directed. Do not cut. Secured With Compression Wrap Profore Lite LF 3 Multilayer Compression Bandaging System Discharge Instruction: Apply 3 multi-layer wrap as prescribed. Compression Stockings Add-Ons Electronic Signature(s) Signed: 12/07/2020 5:36:34 PM By: Gretta Cool, BSN, RN, CWS, Kim RN, BSN Entered By: Gretta Cool, BSN, RN, CWS, Kim on 12/07/2020 17:23:55 [name] [name] ([id number]) -------------------------------------------------------------------------------- Wound Assessment Details Patient Name: COLTON, ENGDAHL. Date of Service: 12/07/2020 9:15 AM Medical Record Number: [id number] Patient Account Number: [id number] Date of Birth/Sex: 01-22-1927 (87 y.o. F) Treating RN: Cornell Barman Primary Care Kodie Pick: Tracie Harrier Other Clinician: Jeanine Luz Referring Eryck Negron: Tracie Harrier Treating Ndia Sampath/Extender: Skipper Cliche in Treatment: 0 Wound Status Wound Number: 2 Primary Etiology: Venous Leg Ulcer Wound Location: Right, Distal, Posterior Lower Leg Wound Status: Open Wounding Event: Gradually Appeared Date Acquired: 09/30/2020 Weeks Of Treatment: 0 Clustered Wound: No Wound Measurements Length: (cm) 1 Width: (cm) 0.7 Depth: (cm) 0.1 Area: (cm) 0.55 Volume: (cm) 0.055 % Reduction in Area: 0% % Reduction in Volume: 0% Wound Description Classification: Full Thickness Without Exposed Support Structure s Treatment Notes Wound #2 (Lower Leg) Wound Laterality: Right, Posterior, Distal Cleanser Peri-Wound Care Topical Primary Dressing Hydrofera Blue Ready Transfer Foam, 2.5x2.5 (in/in) Discharge Instruction: Apply Hydrofera Blue Ready to wound bed as directed Secondary Dressing Xtrasorb Medium 4x5 (in/in) Discharge Instruction: Apply to wound as directed. Do not cut. Secured With Compression Wrap Profore Lite LF 3 Multilayer  Compression Bandaging System Discharge Instruction: Apply 3 multi-layer wrap as prescribed. Compression Stockings Add-Ons Electronic Signature(s) Signed: 12/07/2020 5:36:34 PM By: Gretta Cool, BSN, RN, CWS, Kim RN, BSN Entered By: Gretta Cool, BSN, RN, CWS, Kim on 12/07/2020 17:23:55 [name] [name] ([id number]) -------------------------------------------------------------------------------- Wound Assessment Details Patient Name: NARYAH, CLENNEY. Date of Service: 12/07/2020 9:15 AM Medical Record Number: [id number] Patient Account Number: [id number] Date of Birth/Sex: 04-Oct-1926 (87 y.o. F) Treating RN: Cornell Barman Primary Care Shuntae Herzig: Tracie Harrier Other Clinician: Jeanine Luz Referring Aliciana Ricciardi: Tracie Harrier Treating Zyeir Dymek/Extender: Skipper Cliche in Treatment: 0 Wound Status Wound Number: 3 Primary Etiology: Venous Leg Ulcer Wound Location: Left, Posterior Lower Leg Wound Status: Open Wounding Event: Gradually Appeared Date Acquired: 09/30/2020 Weeks Of Treatment: 0 Clustered Wound: No Wound Measurements Length: (cm) 0.8 Width: (cm) 0.4 Depth: (cm) 0.1 Area: (cm) 0.251 Volume: (cm) 0.025 % Reduction in Area: 0% % Reduction in Volume: 0% Wound Description Classification: Full Thickness Without Exposed Support Structure s Treatment Notes Wound #3 (Lower Leg) Wound Laterality: Left, Posterior Cleanser Peri-Wound Care Topical Primary Dressing Hydrofera Blue Ready Transfer Foam, 2.5x2.5 (in/in) Discharge Instruction: Apply [facility]  Ready to wound bed as directed Secondary Dressing Xtrasorb Medium 4x5 (in/in) Discharge Instruction: Apply to wound as directed. Do not cut. Secured With Compression Wrap Profore Lite LF 3 Multilayer Compression Bandaging System Discharge Instruction: Apply 3 multi-layer wrap as prescribed. Compression Stockings Add-Ons Electronic Signature(s) Signed: 12/07/2020 5:36:34 PM By: Gretta Cool, BSN, RN, CWS, Kim RN, BSN Entered By:  Gretta Cool, BSN, RN, CWS, Kim on 12/07/2020 17:23:55

## 2020-12-12 ENCOUNTER — Encounter: Payer: PPO | Admitting: Internal Medicine

## 2020-12-12 ENCOUNTER — Other Ambulatory Visit: Payer: Self-pay

## 2020-12-12 DIAGNOSIS — L97911 Non-pressure chronic ulcer of unspecified part of right lower leg limited to breakdown of skin: Secondary | ICD-10-CM | POA: Diagnosis not present

## 2020-12-12 DIAGNOSIS — R262 Difficulty in walking, not elsewhere classified: Secondary | ICD-10-CM | POA: Diagnosis not present

## 2020-12-12 DIAGNOSIS — L97212 Non-pressure chronic ulcer of right calf with fat layer exposed: Secondary | ICD-10-CM | POA: Diagnosis not present

## 2020-12-12 DIAGNOSIS — R7309 Other abnormal glucose: Secondary | ICD-10-CM | POA: Diagnosis not present

## 2020-12-12 DIAGNOSIS — J679 Hypersensitivity pneumonitis due to unspecified organic dust: Secondary | ICD-10-CM | POA: Diagnosis not present

## 2020-12-12 DIAGNOSIS — I480 Paroxysmal atrial fibrillation: Secondary | ICD-10-CM | POA: Diagnosis not present

## 2020-12-12 DIAGNOSIS — I87333 Chronic venous hypertension (idiopathic) with ulcer and inflammation of bilateral lower extremity: Secondary | ICD-10-CM | POA: Diagnosis not present

## 2020-12-12 DIAGNOSIS — R8290 Unspecified abnormal findings in urine: Secondary | ICD-10-CM | POA: Diagnosis not present

## 2020-12-12 DIAGNOSIS — I10 Essential (primary) hypertension: Secondary | ICD-10-CM | POA: Diagnosis not present

## 2020-12-12 DIAGNOSIS — I5032 Chronic diastolic (congestive) heart failure: Secondary | ICD-10-CM | POA: Diagnosis not present

## 2020-12-12 DIAGNOSIS — J440 Chronic obstructive pulmonary disease with acute lower respiratory infection: Secondary | ICD-10-CM | POA: Diagnosis not present

## 2020-12-12 DIAGNOSIS — Z6831 Body mass index (BMI) 31.0-31.9, adult: Secondary | ICD-10-CM | POA: Diagnosis not present

## 2020-12-12 DIAGNOSIS — L97921 Non-pressure chronic ulcer of unspecified part of left lower leg limited to breakdown of skin: Secondary | ICD-10-CM | POA: Diagnosis not present

## 2020-12-12 DIAGNOSIS — N3941 Urge incontinence: Secondary | ICD-10-CM | POA: Diagnosis not present

## 2020-12-12 DIAGNOSIS — I872 Venous insufficiency (chronic) (peripheral): Secondary | ICD-10-CM | POA: Diagnosis not present

## 2020-12-13 NOTE — Progress Notes (Signed)
[name] [name] ([id number]) Visit Report for 12/12/2020 Debridement Details Patient Name: [name] [name], [name] [name]. Date of Service: 12/12/2020 9:00 AM Medical Record Number: [id number] Patient Account Number: [id number] Date of Birth/Sex: 1927/01/16 (87 y.o. F) Treating RN: Cornell Barman Primary Care Provider: Tracie Harrier Other Clinician: Referring Provider: Tracie Harrier Treating Provider/Extender: Tito Dine in Treatment: 1 Debridement Performed for Wound #1 Right,Proximal,Posterior Lower Leg Assessment: Performed By: Physician Ricard Dillon, MD Debridement Type: Debridement Severity of Tissue Pre Debridement: Fat layer exposed Level of Consciousness (Pre- Awake and Alert procedure): Pre-procedure Verification/Time Out Yes - 10:10 Taken: Pain Control: Lidocaine Total Area Debrided (L x W): 1.7 (cm) x 1.2 (cm) = 2.04 (cm) Tissue and other material Viable, Non-Viable, Slough, Subcutaneous, Slough debrided: Level: Skin/Subcutaneous Tissue Debridement Description: Excisional Instrument: Curette Bleeding: Moderate Hemostasis Achieved: Pressure Response to Treatment: Procedure was tolerated well Level of Consciousness (Post- Awake and Alert procedure): Post Debridement Measurements of Total Wound Length: (cm) 1.7 Width: (cm) 1.2 Depth: (cm) 0.2 Volume: (cm) 0.32 Character of Wound/Ulcer Post Debridement: Stable Severity of Tissue Post Debridement: Fat layer exposed Post Procedure Diagnosis Same as Pre-procedure Electronic Signature(s) Signed: 12/12/2020 5:15:48 PM By: Linton Ham MD Signed: 12/12/2020 6:23:16 PM By: Gretta Cool, BSN, RN, CWS, Kim RN, BSN Entered By: Linton Ham on 12/12/2020 10:35:24 [name] [name] ([id number]) -------------------------------------------------------------------------------- Debridement Details Patient Name: [name], [name] Date of Service: 12/12/2020 9:00 AM Medical Record Number: [id number] Patient Account Number:  [id number] Date of Birth/Sex: 1927/09/08 (87 y.o. F) Treating RN: Cornell Barman Primary Care Provider: Tracie Harrier Other Clinician: Referring Provider: Tracie Harrier Treating Provider/Extender: Tito Dine in Treatment: 1 Debridement Performed for Wound #2 Right,Distal,Posterior Lower Leg Assessment: Performed By: Physician Ricard Dillon, MD Debridement Type: Debridement Severity of Tissue Pre Debridement: Fat layer exposed Level of Consciousness (Pre- Awake and Alert procedure): Pre-procedure Verification/Time Out Yes - 10:10 Taken: Pain Control: Lidocaine Total Area Debrided (L x W): 0.8 (cm) x 0.5 (cm) = 0.4 (cm) Tissue and other material Viable, Non-Viable, Slough, Subcutaneous, Slough debrided: Level: Skin/Subcutaneous Tissue Debridement Description: Excisional Instrument: Curette Bleeding: Moderate Hemostasis Achieved: Pressure Response to Treatment: Procedure was tolerated well Level of Consciousness (Post- Awake and Alert procedure): Post Debridement Measurements of Total Wound Length: (cm) 0.8 Width: (cm) 0.5 Depth: (cm) 0.2 Volume: (cm) 0.063 Character of Wound/Ulcer Post Debridement: Stable Severity of Tissue Post Debridement: Fat layer exposed Post Procedure Diagnosis Same as Pre-procedure Electronic Signature(s) Signed: 12/12/2020 5:15:48 PM By: Linton Ham MD Signed: 12/12/2020 6:23:16 PM By: Gretta Cool, BSN, RN, CWS, Kim RN, BSN Entered By: Linton Ham on 12/12/2020 10:35:34 [name] [name] ([id number]) -------------------------------------------------------------------------------- HPI Details Patient Name: [name], [name] Date of Service: 12/12/2020 9:00 AM Medical Record Number: [id number] Patient Account Number: [id number] Date of Birth/Sex: 05-Jan-1927 (87 y.o. F) Treating RN: Cornell Barman Primary Care Provider: Tracie Harrier Other Clinician: Referring Provider: Tracie Harrier Treating Provider/Extender: Tito Dine in Treatment: 1 History of Present Illness HPI Description: ADMISSION 12/05/2020 This is a 87 year old woman who arrived accompanied by her daughter from [place]. She has been dealing with wounds on her bilateral posterior calf since January. There is no obvious antecedent injury. Her daughter tells me that she is actually had wounds that have opened and closed on her legs since last summer. She currently she has 2 wounds on the right posterior calf and 1 on the left. They have been using Vaseline on the wounds and covering the area with kerlix but they have not  been healing. The patient has been wearing what appears to be support stockings. Past medical history includes paroxysmal A. fib, pacemaker,, hypertension, PVD, COPD, left mastectomy chronic lower extremity swelling ABIs were noncompressible bilateral 3/23; 1 week follow-up. Bilateral venous insufficiency ulcers right greater than left. As opposed to what I was worried about last week she actually tolerated 3 layer compression. We used [employer]) Signed: 12/12/2020 5:15:48 PM By: Linton Ham MD Entered By: Linton Ham on 12/12/2020 10:36:09 [name] [name] ([id number]) -------------------------------------------------------------------------------- Physical Exam Details Patient Name: [name] [name] Date of Service: 12/12/2020 9:00 AM Medical Record Number: [id number] Patient Account Number: [id number] Date of Birth/Sex: 02-15-1927 (87 y.o. F) Treating RN: Cornell Barman Primary Care Provider: Tracie Harrier Other Clinician: Referring Provider: Tracie Harrier Treating Provider/Extender: Ricard Dillon Weeks in Treatment: 1 Constitutional Sitting or standing Blood Pressure is within target range for patient.. Pulse regular and within target range for patient.Marland Kitchen Respirations regular, non- labored and within target range.. Temperature is normal and within the target range  for the patient.Marland Kitchen appears in no distress. Cardiovascular Pedal pulses palpable.. Clear changes of chronic venous insufficiency. Edema is fairly well controlled. Integumentary (Hair, Skin) Skin changes of chronic venous insufficiency. Notes Wound exam; mostly the areas on her left posterior calf are just about closed. On the right she has more punched-out areas again covered in a fibrinous difficult to debride material I used a #3 curette to debride these again. Hemostasis with direct pressure. Electronic Signature(s) Signed: 12/12/2020 5:15:48 PM By: Linton Ham MD Entered By: Linton Ham on 12/12/2020 10:50:46 [name] [name] ([id number]) -------------------------------------------------------------------------------- Physician Orders Details Patient Name: [name] [name] Date of Service: 12/12/2020 9:00 AM Medical Record Number: [id number] Patient Account Number: [id number] Date of Birth/Sex: 09/27/1926 (87 y.o. F) Treating RN: Cornell Barman Primary Care Provider: Tracie Harrier Other Clinician: Referring Provider: Tracie Harrier Treating Provider/Extender: Tito Dine in Treatment: 1 Verbal / Phone Orders: No Diagnosis Coding Follow-up Appointments o Return Appointment in 1 week. o Nurse Visit as needed Bathing/ Shower/ Hygiene o May shower with wound dressing protected with water repellent cover or cast protector. Edema Control - Lymphedema / Segmental Compressive Device / Other Bilateral Lower Extremities o Optional: One layer of unna paste to top of compression wrap (to act as an anchor). o Elevate, Exercise Daily and Avoid Standing for Long Periods of Time. o Elevate legs to the level of the heart and pump ankles as often as possible o Elevate leg(s) parallel to the floor when sitting. Additional Orders / Instructions o Follow Nutritious Diet and Increase Protein Intake Wound Treatment Wound #1 - Lower Leg Wound Laterality: Right,  Posterior, Proximal Primary Dressing: Hydrofera Blue Ready Transfer Foam, 2.5x2.5 (in/in) Discharge Instructions: Apply Hydrofera Blue Ready to wound bed as directed Secondary Dressing: Xtrasorb Medium 4x5 (in/in) Discharge Instructions: Apply to wound as directed. Do not cut. Compression Wrap: Profore Lite LF 3 Multilayer Compression Bandaging System Discharge Instructions: Apply 3 multi-layer wrap as prescribed. Wound #2 - Lower Leg Wound Laterality: Right, Posterior, Distal Primary Dressing: Hydrofera Blue Ready Transfer Foam, 2.5x2.5 (in/in) Discharge Instructions: Apply Hydrofera Blue Ready to wound bed as directed Secondary Dressing: Xtrasorb Medium 4x5 (in/in) Discharge Instructions: Apply to wound as directed. Do not cut. Compression Wrap: Profore Lite LF 3 Multilayer Compression Bandaging System Discharge Instructions: Apply 3 multi-layer wrap as prescribed. Wound #3 - Lower Leg Wound Laterality: Left, Posterior Primary Dressing: Hydrofera Blue Ready Transfer Foam, 2.5x2.5 (in/in) Discharge Instructions: Apply Hydrofera Blue Ready  to wound bed as directed Secondary Dressing: Xtrasorb Medium 4x5 (in/in) Discharge Instructions: Apply to wound as directed. Do not cut. Compression Wrap: Profore Lite LF 3 Multilayer Compression Bandaging System Discharge Instructions: Apply 3 multi-layer wrap as prescribed. Electronic Signature(s) Signed: 12/12/2020 5:15:48 PM By: Linton Ham MD Signed: 12/12/2020 6:23:16 PM By: Gretta Cool BSN, RN, CWS, Kim RN, BSN Commodore, Deborah Chalk ([id number]) Entered By: Gretta Cool, BSN, RN, CWS, Kim on 12/12/2020 10:13:25 [name] [name] ([id number]) -------------------------------------------------------------------------------- Problem List Details Patient Name: GAILYN, CROOK Date of Service: 12/12/2020 9:00 AM Medical Record Number: [id number] Patient Account Number: [id number] Date of Birth/Sex: May 17, 1927 (87 y.o. F) Treating RN: Cornell Barman Primary Care Provider:  Tracie Harrier Other Clinician: Referring Provider: Tracie Harrier Treating Provider/Extender: Tito Dine in Treatment: 1 Active Problems ICD-10 Encounter Code Description Active Date MDM Diagnosis I87.333 Chronic venous hypertension (idiopathic) with ulcer and inflammation of 12/05/2020 No Yes bilateral lower extremity L97.221 Non-pressure chronic ulcer of left calf limited to breakdown of skin 12/05/2020 No Yes L97.211 Non-pressure chronic ulcer of right calf limited to breakdown of skin 12/05/2020 No Yes Inactive Problems Resolved Problems Electronic Signature(s) Signed: 12/12/2020 5:15:48 PM By: Linton Ham MD Entered By: Linton Ham on 12/12/2020 10:34:54 [name] [name] ([id number]) -------------------------------------------------------------------------------- Progress Note Details Patient Name: [name] [name] Date of Service: 12/12/2020 9:00 AM Medical Record Number: [id number] Patient Account Number: [id number] Date of Birth/Sex: 07/12/1927 (87 y.o. F) Treating RN: Cornell Barman Primary Care Provider: Tracie Harrier Other Clinician: Referring Provider: Tracie Harrier Treating Provider/Extender: Tito Dine in Treatment: 1 Subjective History of Present Illness (HPI) ADMISSION 12/05/2020 This is a 87 year old woman who arrived accompanied by her daughter from [place]. She has been dealing with wounds on her bilateral posterior calf since January. There is no obvious antecedent injury. Her daughter tells me that she is actually had wounds that have opened and closed on her legs since last summer. She currently she has 2 wounds on the right posterior calf and 1 on the left. They have been using Vaseline on the wounds and covering the area with kerlix but they have not been healing. The patient has been wearing what appears to be support stockings. Past medical history includes paroxysmal A. fib, pacemaker,, hypertension, PVD,  COPD, left mastectomy chronic lower extremity swelling ABIs were noncompressible bilateral 3/23; 1 week follow-up. Bilateral venous insufficiency ulcers right greater than left. As opposed to what I was worried about last week she actually tolerated 3 layer compression. We used Hydrofera Blue Objective Constitutional Sitting or standing Blood Pressure is within target range for patient.. Pulse regular and within target range for patient.Marland Kitchen Respirations regular, non- labored and within target range.. Temperature is normal and within the target range for the patient.Marland Kitchen appears in no distress. Vitals Time Taken: 9:34 AM, Height: 63 in, Weight: 174 lbs, BMI: 30.8, Temperature: 97.9 F, Pulse: 67 bpm, Respiratory Rate: 20 breaths/min, Blood Pressure: 108/62 mmHg. Cardiovascular Pedal pulses palpable.. Clear changes of chronic venous insufficiency. Edema is fairly well controlled. General Notes: Wound exam; mostly the areas on her left posterior calf are just about closed. On the right she has more punched-out areas again covered in a fibrinous difficult to debride material I used a #3 curette to debride these again. Hemostasis with direct pressure. Integumentary (Hair, Skin) Skin changes of chronic venous insufficiency. Wound #1 status is Open. Original cause of wound was Gradually Appeared. The date acquired was: 09/30/2020. The wound has been in treatment 1 weeks. The wound  is located on the Right,Proximal,Posterior Lower Leg. The wound measures 1.7cm length x 1.2cm width x 0.1cm depth; 1.602cm^2 area and 0.16cm^3 volume. There is Fat Layer (Subcutaneous Tissue) exposed. There is no tunneling or undermining noted. There is a medium amount of serosanguineous drainage noted. There is medium (34-66%) red granulation within the wound bed. There is a medium (34- 66%) amount of necrotic tissue within the wound bed including Adherent Slough. Wound #2 status is Open. Original cause of wound was Gradually  Appeared. The date acquired was: 09/30/2020. The wound has been in treatment 1 weeks. The wound is located on the Right,Distal,Posterior Lower Leg. The wound measures 0.8cm length x 0.5cm width x 0.1cm depth; 0.314cm^2 area and 0.031cm^3 volume. There is Fat Layer (Subcutaneous Tissue) exposed. There is no tunneling or undermining noted. There is a medium amount of serosanguineous drainage noted. There is medium (34-66%) red granulation within the wound bed. There is a medium (34-66%) amount of necrotic tissue within the wound bed including Adherent Slough. Wound #3 status is Open. Original cause of wound was Gradually Appeared. The date acquired was: 09/30/2020. The wound has been in treatment 1 weeks. The wound is located on the Left,Posterior Lower Leg. The wound measures 0.1cm length x 0.1cm width x 0.1cm depth; 0.008cm^2 area and 0.001cm^3 volume. There is no tunneling or undermining noted. There is a none present amount of drainage noted. There is no granulation within the wound bed. There is a large (67-100%) amount of necrotic tissue within the wound bed including Eschar. CRESTA, RIDEN ([id number]) Assessment Active Problems ICD-10 Chronic venous hypertension (idiopathic) with ulcer and inflammation of bilateral lower extremity Non-pressure chronic ulcer of left calf limited to breakdown of skin Non-pressure chronic ulcer of right calf limited to breakdown of skin Procedures Wound #1 Pre-procedure diagnosis of Wound #1 is a Venous Leg Ulcer located on the Right,Proximal,Posterior Lower Leg .Severity of Tissue Pre Debridement is: Fat layer exposed. There was a Excisional Skin/Subcutaneous Tissue Debridement with a total area of 2.04 sq cm performed by Ricard Dillon, MD. With the following instrument(s): Curette to remove Viable and Non-Viable tissue/material. Material removed includes Subcutaneous Tissue and Slough and after achieving pain control using Lidocaine. No specimens were  taken. A time out was conducted at 10:10, prior to the start of the procedure. A Moderate amount of bleeding was controlled with Pressure. The procedure was tolerated well. Post Debridement Measurements: 1.7cm length x 1.2cm width x 0.2cm depth; 0.32cm^3 volume. Character of Wound/Ulcer Post Debridement is stable. Severity of Tissue Post Debridement is: Fat layer exposed. Post procedure Diagnosis Wound #1: Same as Pre-Procedure Wound #2 Pre-procedure diagnosis of Wound #2 is a Venous Leg Ulcer located on the Right,Distal,Posterior Lower Leg .Severity of Tissue Pre Debridement is: Fat layer exposed. There was a Excisional Skin/Subcutaneous Tissue Debridement with a total area of 0.4 sq cm performed by Ricard Dillon, MD. With the following instrument(s): Curette to remove Viable and Non-Viable tissue/material. Material removed includes Subcutaneous Tissue and Slough and after achieving pain control using Lidocaine. No specimens were taken. A time out was conducted at 10:10, prior to the start of the procedure. A Moderate amount of bleeding was controlled with Pressure. The procedure was tolerated well. Post Debridement Measurements: 0.8cm length x 0.5cm width x 0.2cm depth; 0.063cm^3 volume. Character of Wound/Ulcer Post Debridement is stable. Severity of Tissue Post Debridement is: Fat layer exposed. Post procedure Diagnosis Wound #2: Same as Pre-Procedure Plan Follow-up Appointments: Return Appointment in 1 week. Nurse Visit  as needed Bathing/ Shower/ Hygiene: May shower with wound dressing protected with water repellent cover or cast protector. Edema Control - Lymphedema / Segmental Compressive Device / Other: Optional: One layer of unna paste to top of compression wrap (to act as an anchor). Elevate, Exercise Daily and Avoid Standing for Long Periods of Time. Elevate legs to the level of the heart and pump ankles as often as possible Elevate leg(s) parallel to the floor when  sitting. Additional Orders / Instructions: Follow Nutritious Diet and Increase Protein Intake WOUND #1: - Lower Leg Wound Laterality: Right, Posterior, Proximal Primary Dressing: Hydrofera Blue Ready Transfer Foam, 2.5x2.5 (in/in) Discharge Instructions: Apply Hydrofera Blue Ready to wound bed as directed Secondary Dressing: Xtrasorb Medium 4x5 (in/in) Discharge Instructions: Apply to wound as directed. Do not cut. Compression Wrap: Profore Lite LF 3 Multilayer Compression Bandaging System Discharge Instructions: Apply 3 multi-layer wrap as prescribed. WOUND #2: - Lower Leg Wound Laterality: Right, Posterior, Distal Primary Dressing: Hydrofera Blue Ready Transfer Foam, 2.5x2.5 (in/in) Discharge Instructions: Apply Hydrofera Blue Ready to wound bed as directed Secondary Dressing: Xtrasorb Medium 4x5 (in/in) Discharge Instructions: Apply to wound as directed. Do not cut. Compression Wrap: Profore Lite LF 3 Multilayer Compression Bandaging System Discharge Instructions: Apply 3 multi-layer wrap as prescribed. WOUND #3: - Lower Leg Wound Laterality: Left, Posterior Primary Dressing: Hydrofera Blue Ready Transfer Foam, 2.5x2.5 (in/in) Discharge Instructions: Apply Hydrofera Blue Ready to wound bed as directed NNEKA, BLANDA ([id number]) Secondary Dressing: Lauraine Rinne Medium 4x5 (in/in) Discharge Instructions: Apply to wound as directed. Do not cut. Compression Wrap: Profore Lite LF 3 Multilayer Compression Bandaging System Discharge Instructions: Apply 3 multi-layer wrap as prescribed. #1 we are going to continue with the Hydrofera Blue-based dressings both under 3 layer compression 2. I think we can transition the patient probably to her own stockings on the left next week. 3. I am doubtful the areas on the right will be healed by next week. Electronic Signature(s) Signed: 12/12/2020 5:15:48 PM By: Linton Ham MD Entered By: Linton Ham on 12/12/2020 10:53:33 [name] [name]  ([id number]) -------------------------------------------------------------------------------- SuperBill Details Patient Name: [name] [name] Date of Service: 12/12/2020 Medical Record Number: [id number] Patient Account Number: [id number] Date of Birth/Sex: 03/03/1927 (87 y.o. F) Treating RN: Cornell Barman Primary Care Provider: Tracie Harrier Other Clinician: Referring Provider: Tracie Harrier Treating Provider/Extender: Tito Dine in Treatment: 1 Diagnosis Coding ICD-10 Codes Code Description [phone] Chronic venous hypertension (idiopathic) with ulcer and inflammation of bilateral lower extremity L97.221 Non-pressure chronic ulcer of left calf limited to breakdown of skin L97.211 Non-pressure chronic ulcer of right calf limited to breakdown of skin Facility Procedures CPT4 Code Description: [id number] - DEB SUBQ TISSUE 20 SQ CM/< Modifier: Quantity: 1 CPT4 Code Description: ICD-10 Diagnosis Description L97.211 Non-pressure chronic ulcer of right calf limited to breakdown of skin I87.333 Chronic venous hypertension (idiopathic) with ulcer and inflammation of b Modifier: ilateral lower extr Quantity: emity Physician Procedures CPT4 Code Description: [id number] - WC PHYS SUBQ TISS 20 SQ CM Modifier: Quantity: 1 CPT4 Code Description: ICD-10 Diagnosis Description L97.211 Non-pressure chronic ulcer of right calf limited to breakdown of skin I87.333 Chronic venous hypertension (idiopathic) with ulcer and inflammation of b Modifier: ilateral lower extre Quantity: [employer]) Signed: 12/12/2020 5:15:48 PM By: Linton Ham MD Entered By: Linton Ham on 12/12/2020 10:53:54

## 2020-12-17 DIAGNOSIS — B351 Tinea unguium: Secondary | ICD-10-CM | POA: Diagnosis not present

## 2020-12-17 DIAGNOSIS — M79675 Pain in left toe(s): Secondary | ICD-10-CM | POA: Diagnosis not present

## 2020-12-17 DIAGNOSIS — M79674 Pain in right toe(s): Secondary | ICD-10-CM | POA: Diagnosis not present

## 2020-12-17 NOTE — Progress Notes (Signed)
[name] [name], [name] [name] ([id number]) Visit Report for 12/12/2020 Arrival Information Details Patient Name: [name] [name], [name] [name]. Date of Service: 12/12/2020 9:00 AM Medical Record Number: [id number] Patient Account Number: [id number] Date of Birth/Sex: 1926-11-11 (87 y.o. F) Treating RN: Carlene Coria Primary Care Ekansh Sherk: Tracie Harrier Other Clinician: Referring Ahrianna Siglin: Tracie Harrier Treating Lerlene Treadwell/Extender: Tito Dine in Treatment: 1 Visit Information History Since Last Visit All ordered tests and consults were completed: No Patient Arrived: Wheel Chair Added or deleted any medications: No Arrival Time: 09:32 Any new allergies or adverse reactions: No Accompanied By: daughter Had a fall or experienced change in No Transfer Assistance: None activities of daily living that may affect Patient Identification Verified: Yes risk of falls: Secondary Verification Process Completed: Yes Signs or symptoms of abuse/neglect since last visito No Patient Requires Transmission-Based No Hospitalized since last visit: No Precautions: Implantable device outside of the clinic excluding No Patient Has Alerts: Yes cellular tissue based products placed in the center Patient Alerts: Patient on Blood Thinner since last visit: warfarin Has Dressing in Place as Prescribed: Yes NON COMPRESSIBLE L and Has Compression in Place as Prescribed: Yes R NOT DIABETIC Pain Present Now: Yes Electronic Signature(s) Signed: 12/17/2020 5:08:01 PM By: Carlene Coria RN Entered By: Carlene Coria on 12/12/2020 09:34:08 [name] [name] ([id number]) -------------------------------------------------------------------------------- Encounter Discharge Information Details Patient Name: [name] [name] Date of Service: 12/12/2020 9:00 AM Medical Record Number: [id number] Patient Account Number: [id number] Date of Birth/Sex: [birth year] (87 y.o. F) Treating RN: Carlene Coria Primary Care Ronnell Makarewicz: Tracie Harrier  Other Clinician: Referring Mckenize Mezera: Tracie Harrier Treating Demarlo Riojas/Extender: Tito Dine in Treatment: 1 Encounter Discharge Information Items Post Procedure Vitals Discharge Condition: Stable Temperature (F): 97.9 Ambulatory Status: Ambulatory Pulse (bpm): 67 Discharge Destination: Home Respiratory Rate (breaths/min): 20 Transportation: Private Auto Blood Pressure (mmHg): 108/62 Accompanied By: daughter Schedule Follow-up Appointment: Yes Clinical Summary of Care: Patient Declined Electronic Signature(s) Signed: 12/17/2020 5:08:01 PM By: Carlene Coria RN Entered By: Carlene Coria on 12/12/2020 10:35:00 [name] [name] ([id number]) -------------------------------------------------------------------------------- Lower Extremity Assessment Details Patient Name: [name] [name] Date of Service: 12/12/2020 9:00 AM Medical Record Number: [id number] Patient Account Number: [id number] Date of Birth/Sex: 11/03/1926 (87 y.o. F) Treating RN: Carlene Coria Primary Care Katana Berthold: Tracie Harrier Other Clinician: Referring Lashica Hannay: Tracie Harrier Treating Oryan Winterton/Extender: Ricard Dillon Weeks in Treatment: 1 Edema Assessment Assessed: [Left: No] [Right: No] Edema: [Left: Yes] [Right: Yes] Calf Left: Right: Point of Measurement: 31 cm From Medial Instep 34 cm 36 cm Ankle Left: Right: Point of Measurement: 11 cm From Medial Instep 19 cm 19 cm Vascular Assessment Pulses: Dorsalis Pedis Palpable: [Left:Yes] [Right:Yes] Electronic Signature(s) Signed: 12/17/2020 5:08:01 PM By: Carlene Coria RN Entered By: Carlene Coria on 12/12/2020 09:52:52 [name] [name] ([id number]) -------------------------------------------------------------------------------- Multi Wound Chart Details Patient Name: [name] [name] Date of Service: 12/12/2020 9:00 AM Medical Record Number: [id number] Patient Account Number: [id number] Date of Birth/Sex: [birth year] (87 y.o. F) Treating RN:  Cornell Barman Primary Care Frida Wahlstrom: Tracie Harrier Other Clinician: Referring Sabra Sessler: Tracie Harrier Treating Rease Swinson/Extender: Tito Dine in Treatment: 1 Vital Signs Height(in): 78 Pulse(bpm): 88 Weight(lbs): 174 Blood Pressure(mmHg): 108/62 Body Mass Index(BMI): 31 Temperature(F): 97.9 Respiratory Rate(breaths/min): 20 Photos: Wound Location: Right, Proximal, Posterior Lower Right, Distal, Posterior Lower Leg Left, Posterior Lower Leg Leg Wounding Event: Gradually Appeared Gradually Appeared Gradually Appeared Primary Etiology: Venous Leg Ulcer Venous Leg Ulcer Venous Leg Ulcer Comorbid History: Chronic Obstructive Pulmonary Chronic Obstructive Pulmonary Chronic Obstructive Pulmonary Disease (COPD), Arrhythmia, Disease (COPD),  Arrhythmia, Disease (COPD), Arrhythmia, Congestive Heart Failure, Congestive Heart Failure, Congestive Heart Failure, Hypertension, Received Hypertension, Received Hypertension, Received Chemotherapy Chemotherapy Chemotherapy Date Acquired: 09/30/2020 09/30/2020 09/30/2020 Weeks of Treatment: 1 1 1  Wound Status: Open Open Open Measurements L x W x D (cm) 1.7x1.2x0.1 0.8x0.5x0.1 0.1x0.1x0.1 Area (cm) : 1.602 0.314 0.008 Volume (cm) : 0.16 0.031 0.001 % Reduction in Area: 27.10% 42.90% 96.80% % Reduction in Volume: 27.30% 43.60% 96.00% Classification: Full Thickness Without Exposed Full Thickness Without Exposed Full Thickness Without Exposed Support Structures Support Structures Support Structures Exudate Amount: Medium Medium None Present Exudate Type: Serosanguineous Serosanguineous N/A Exudate Color: red, brown red, brown N/A Granulation Amount: Medium (34-66%) Medium (34-66%) None Present (0%) Granulation Quality: Red Red N/A Necrotic Amount: Medium (34-66%) Medium (34-66%) Large (67-100%) Necrotic Tissue: Adherent [place] Exposed Structures: Fat Layer (Subcutaneous Tissue): Fat Layer (Subcutaneous  Tissue): Fascia: No Yes Yes Fat Layer (Subcutaneous Tissue): Fascia: No Fascia: No No Tendon: No Tendon: No Tendon: No Muscle: No Muscle: No Muscle: No Joint: No Joint: No Joint: No Bone: No Bone: No Bone: No Epithelialization: N/A N/A None Debridement: Debridement - Excisional Debridement - Excisional N/A Pre-procedure Verification/Time 10:10 10:10 N/A Out Taken: Pain Control: Lidocaine Lidocaine N/A Tissue Debrided: Subcutaneous, Slough Subcutaneous, Slough N/A Level: Skin/Subcutaneous Tissue Skin/Subcutaneous Tissue N/A Debridement Area (sq cm): 2.04 0.4 N/A Instrument: Curette Curette N/A Bleeding: Moderate Moderate N/A [name] [name], [name] [name] ([id number]) Hemostasis Achieved: Pressure Pressure N/A Debridement Treatment Procedure was tolerated well Procedure was tolerated well N/A Response: Post Debridement 1.7x1.2x0.2 0.8x0.5x0.2 N/A Measurements L x W x D (cm) Post Debridement Volume: 0.32 0.063 N/A (cm) Procedures Performed: Debridement Debridement N/A Treatment Notes Wound #1 (Lower Leg) Wound Laterality: Right, Posterior, Proximal Cleanser Peri-Wound Care Topical Primary Dressing Hydrofera Blue Ready Transfer Foam, 2.5x2.5 (in/in) Discharge Instruction: Apply Hydrofera Blue Ready to wound bed as directed Secondary Dressing Xtrasorb Medium 4x5 (in/in) Discharge Instruction: Apply to wound as directed. Do not cut. Secured With Compression Wrap Profore Lite LF 3 Multilayer Compression Bandaging System Discharge Instruction: Apply 3 multi-layer wrap as prescribed. Compression Stockings Add-Ons Wound #2 (Lower Leg) Wound Laterality: Right, Posterior, Distal Cleanser Peri-Wound Care Topical Primary Dressing Hydrofera Blue Ready Transfer Foam, 2.5x2.5 (in/in) Discharge Instruction: Apply Hydrofera Blue Ready to wound bed as directed Secondary Dressing Xtrasorb Medium 4x5 (in/in) Discharge Instruction: Apply to wound as directed. Do not cut. Secured  With Compression Wrap Profore Lite LF 3 Multilayer Compression Bandaging System Discharge Instruction: Apply 3 multi-layer wrap as prescribed. Compression Stockings Add-Ons Wound #3 (Lower Leg) Wound Laterality: Left, Posterior Cleanser Peri-Wound Care [name] [name], [name] [name] ([id number]) Topical Primary Dressing Hydrofera Blue Ready Transfer Foam, 2.5x2.5 (in/in) Discharge Instruction: Apply Hydrofera Blue Ready to wound bed as directed Secondary Dressing Xtrasorb Medium 4x5 (in/in) Discharge Instruction: Apply to wound as directed. Do not cut. Secured With Compression Wrap Profore Lite LF 3 Multilayer Compression Bandaging System Discharge Instruction: Apply 3 multi-layer wrap as prescribed. Compression Stockings Add-Ons Electronic Signature(s) Signed: 12/12/2020 5:15:48 PM By: Linton Ham MD Entered By: Linton Ham on 12/12/2020 10:35:07 [name] [name] ([id number]) -------------------------------------------------------------------------------- [place] Details Patient Name: [name] [name], [name] [name]. Date of Service: 12/12/2020 9:00 AM Medical Record Number: [id number] Patient Account Number: [id number] Date of Birth/Sex: 1926-11-27 (87 y.o. F) Treating RN: Cornell Barman Primary Care Jadarius Commons: Tracie Harrier Other Clinician: Referring Yeshua Stryker: Tracie Harrier Treating Ramy Greth/Extender: Tito Dine in Treatment: 1 Active Inactive Necrotic Tissue Nursing Diagnoses: Impaired tissue integrity related to necrotic/devitalized tissue Goals: Necrotic/devitalized tissue will  be minimized in the wound bed Date Initiated: 12/05/2020 Target Resolution Date: 12/19/2020 Goal Status: Active Interventions: Assess patient pain level pre-, during and post procedure and prior to discharge Treatment Activities: Apply topical anesthetic as ordered : 12/05/2020 Notes: Orientation to the Wound Care Program Nursing Diagnoses: Knowledge deficit related to the wound  healing center program Goals: Patient/caregiver will verbalize understanding of the [place] Date Initiated: 12/05/2020 Target Resolution Date: 12/19/2020 Goal Status: Active Interventions: Provide education on orientation to the wound center Notes: Venous Leg Ulcer Nursing Diagnoses: Actual venous Insuffiency (use after diagnosis is confirmed) Goals: Patient will maintain optimal edema control Date Initiated: 12/05/2020 Target Resolution Date: 12/19/2020 Goal Status: Active Interventions: Assess peripheral edema status every visit. Compression as ordered Provide education on venous insufficiency Treatment Activities: Therapeutic compression applied : 12/05/2020 Notes: Wound/Skin Impairment [name] [name], [name] [name] ([id number]) Nursing Diagnoses: Impaired tissue integrity Goals: Patient/caregiver will verbalize understanding of skin care regimen Date Initiated: 12/05/2020 Target Resolution Date: 12/05/2020 Goal Status: Active Ulcer/skin breakdown will have a volume reduction of 30% by week 4 Date Initiated: 12/05/2020 Target Resolution Date: 01/05/2021 Goal Status: Active Interventions: Assess patient/caregiver ability to perform ulcer/skin care regimen upon admission and as needed Assess ulceration(s) every visit Treatment Activities: Skin care regimen initiated : 12/05/2020 Topical wound management initiated : 12/05/2020 Notes: Electronic Signature(s) Signed: 12/12/2020 6:23:16 PM By: Gretta Cool, BSN, RN, CWS, Kim RN, BSN Entered By: Gretta Cool, BSN, RN, CWS, Kim on 12/12/2020 10:10:12 [name] [name] ([id number]) -------------------------------------------------------------------------------- Pain Assessment Details Patient Name: [name] [name], [name] B. Date of Service: 12/12/2020 9:00 AM Medical Record Number: [id number] Patient Account Number: [id number] Date of Birth/Sex: May 12, 1927 (87 y.o. F) Treating RN: Carlene Coria Primary Care Wyatte Dames: Tracie Harrier Other  Clinician: Referring Jinx Gilden: Tracie Harrier Treating Tema Alire/Extender: Tito Dine in Treatment: 1 Active Problems Location of Pain Severity and Description of Pain Patient Has Paino Yes Site Locations With Dressing Change: Yes Duration of the Pain. Constant / Intermittento Intermittent Rate the pain. Current Pain Level: 0 Worst Pain Level: 10 Least Pain Level: 0 Tolerable Pain Level: 5 Character of Pain Describe the Pain: Burning, Shooting Pain Management and Medication Current Pain Management: Medication: Yes Cold Application: No Rest: Yes Massage: No Activity: No T.E.N.S.: No Heat Application: No Leg drop or elevation: No Is the Current Pain Management Adequate: Inadequate How does your wound impact your activities of daily livingo Sleep: No Bathing: No Appetite: No Relationship With Others: No Bladder Continence: No Emotions: No Bowel Continence: No Work: No Toileting: No Drive: No Dressing: No Hobbies: No Electronic Signature(s) Signed: 12/17/2020 5:08:01 PM By: Carlene Coria RN Entered By: Carlene Coria on 12/12/2020 09:36:18 [name] [name] ([id number]) -------------------------------------------------------------------------------- Patient/Caregiver Education Details Patient Name: [name] [name] Date of Service: 12/12/2020 9:00 AM Medical Record Number: [id number] Patient Account Number: [id number] Date of Birth/Gender: 04/20/1927 (87 y.o. F) Treating RN: Cornell Barman Primary Care Physician: Tracie Harrier Other Clinician: Referring Physician: Tracie Harrier Treating Physician/Extender: Tito Dine in Treatment: 1 Education Assessment Education Provided To: Patient Education Topics Provided Venous: Handouts: Controlling Swelling with Compression Stockings Methods: Demonstration, Explain/Verbal Welcome To [facility]: Wound/Skin Impairment: Handouts: Caring for Your Ulcer Methods: Demonstration,  Explain/Verbal Responses: State content correctly Electronic Signature(s) Signed: 12/12/2020 6:23:16 PM By: Gretta Cool, BSN, RN, CWS, Kim RN, BSN Entered By: Gretta Cool, BSN, RN, CWS, Kim on 12/12/2020 10:14:20 [name] [name] ([id number]) -------------------------------------------------------------------------------- Wound Assessment Details Patient Name: [name] [name] Date of Service: 12/12/2020 9:00 AM Medical Record Number: [id number]  Patient Account Number: [id number] Date of Birth/Sex: 01-09-1927 (87 y.o. F) Treating RN: Carlene Coria Primary Care Jalila Goodnough: Tracie Harrier Other Clinician: Referring Lejon Afzal: Tracie Harrier Treating Hudson Majkowski/Extender: Tito Dine in Treatment: 1 Wound Status Wound Number: 1 Primary Venous Leg Ulcer Etiology: Wound Location: Right, Proximal, Posterior Lower Leg Wound Open Wounding Event: Gradually Appeared Status: Date Acquired: 09/30/2020 Comorbid Chronic Obstructive Pulmonary Disease (COPD), Weeks Of Treatment: 1 History: Arrhythmia, Congestive Heart Failure, Hypertension, Clustered Wound: No Received Chemotherapy Photos Wound Measurements Length: (cm) 1.7 Width: (cm) 1.2 Depth: (cm) 0.1 Area: (cm) 1.602 Volume: (cm) 0.16 % Reduction in Area: 27.1% % Reduction in Volume: 27.3% Tunneling: No Undermining: No Wound Description Classification: Full Thickness Without Exposed Support Structures Exudate Amount: Medium Exudate Type: Serosanguineous Exudate Color: red, brown Foul Odor After Cleansing: No Slough/Fibrino Yes Wound Bed Granulation Amount: Medium (34-66%) Exposed Structure Granulation Quality: Red Fascia Exposed: No Necrotic Amount: Medium (34-66%) Fat Layer (Subcutaneous Tissue) Exposed: Yes Necrotic Quality: Adherent Slough Tendon Exposed: No Muscle Exposed: No Joint Exposed: No Bone Exposed: No Treatment Notes Wound #1 (Lower Leg) Wound Laterality: Right, Posterior, Proximal Cleanser Peri-Wound  Care Topical Primary Dressing [name] [name], [name] [name] ([id number]) Hydrofera Blue Ready Transfer Foam, 2.5x2.5 (in/in) Discharge Instruction: Apply Hydrofera Blue Ready to wound bed as directed Secondary Dressing Xtrasorb Medium 4x5 (in/in) Discharge Instruction: Apply to wound as directed. Do not cut. Secured With Compression Wrap Profore Lite LF 3 Multilayer Compression Bandaging System Discharge Instruction: Apply 3 multi-layer wrap as prescribed. Compression Stockings Add-Ons Electronic Signature(s) Signed: 12/17/2020 5:08:01 PM By: Carlene Coria RN Entered By: Carlene Coria on 12/12/2020 09:51:02 [name] [name] ([id number]) -------------------------------------------------------------------------------- Wound Assessment Details Patient Name: [name] [name] Date of Service: 12/12/2020 9:00 AM Medical Record Number: [id number] Patient Account Number: [id number] Date of Birth/Sex: [birth year] (87 y.o. F) Treating RN: Carlene Coria Primary Care Jayah Balthazar: Tracie Harrier Other Clinician: Referring Karilynn Carranza: Tracie Harrier Treating Devean Skoczylas/Extender: Tito Dine in Treatment: 1 Wound Status Wound Number: 2 Primary Venous Leg Ulcer Etiology: Wound Location: Right, Distal, Posterior Lower Leg Wound Open Wounding Event: Gradually Appeared Status: Date Acquired: 09/30/2020 Comorbid Chronic Obstructive Pulmonary Disease (COPD), Weeks Of Treatment: 1 History: Arrhythmia, Congestive Heart Failure, Hypertension, Clustered Wound: No Received Chemotherapy Photos Wound Measurements Length: (cm) 0.8 Width: (cm) 0.5 Depth: (cm) 0.1 Area: (cm) 0.314 Volume: (cm) 0.031 % Reduction in Area: 42.9% % Reduction in Volume: 43.6% Tunneling: No Undermining: No Wound Description Classification: Full Thickness Without Exposed Support Structures Exudate Amount: Medium Exudate Type: Serosanguineous Exudate Color: red, brown Foul Odor After Cleansing: No Slough/Fibrino Yes Wound  Bed Granulation Amount: Medium (34-66%) Exposed Structure Granulation Quality: Red Fascia Exposed: No Necrotic Amount: Medium (34-66%) Fat Layer (Subcutaneous Tissue) Exposed: Yes Necrotic Quality: Adherent Slough Tendon Exposed: No Muscle Exposed: No Joint Exposed: No Bone Exposed: No Treatment Notes Wound #2 (Lower Leg) Wound Laterality: Right, Posterior, Distal Cleanser Peri-Wound Care Topical Primary Dressing [name] [name], [name] [name] ([id number]) Hydrofera Blue Ready Transfer Foam, 2.5x2.5 (in/in) Discharge Instruction: Apply Hydrofera Blue Ready to wound bed as directed Secondary Dressing Xtrasorb Medium 4x5 (in/in) Discharge Instruction: Apply to wound as directed. Do not cut. Secured With Compression Wrap Profore Lite LF 3 Multilayer Compression Bandaging System Discharge Instruction: Apply 3 multi-layer wrap as prescribed. Compression Stockings Add-Ons Electronic Signature(s) Signed: 12/17/2020 5:08:01 PM By: Carlene Coria RN Entered By: Carlene Coria on 12/12/2020 09:51:38 [name] [name] ([id number]) -------------------------------------------------------------------------------- Wound Assessment Details Patient Name: [name] [name] Date of Service: 12/12/2020 9:00 AM Medical Record Number: [id number]  Patient Account Number: [id number] Date of Birth/Sex: 10/23/1926 (87 y.o. F) Treating RN: Carlene Coria Primary Care Gusta Marksberry: Tracie Harrier Other Clinician: Referring Letroy Vazguez: Tracie Harrier Treating Jaylee Lantry/Extender: Tito Dine in Treatment: 1 Wound Status Wound Number: 3 Primary Venous Leg Ulcer Etiology: Wound Location: Left, Posterior Lower Leg Wound Open Wounding Event: Gradually Appeared Status: Date Acquired: 09/30/2020 Comorbid Chronic Obstructive Pulmonary Disease (COPD), Weeks Of Treatment: 1 History: Arrhythmia, Congestive Heart Failure, Hypertension, Clustered Wound: No Received Chemotherapy Photos Wound Measurements Length: (cm)  0.1 Width: (cm) 0.1 Depth: (cm) 0.1 Area: (cm) 0.008 Volume: (cm) 0.001 % Reduction in Area: 96.8% % Reduction in Volume: 96% Epithelialization: None Tunneling: No Undermining: No Wound Description Classification: Full Thickness Without Exposed Support Structures Exudate Amount: None Present Foul Odor After Cleansing: No Slough/Fibrino Yes Wound Bed Granulation Amount: None Present (0%) Exposed Structure Necrotic Amount: Large (67-100%) Fascia Exposed: No Necrotic Quality: Eschar Fat Layer (Subcutaneous Tissue) Exposed: No Tendon Exposed: No Muscle Exposed: No Joint Exposed: No Bone Exposed: No Treatment Notes Wound #3 (Lower Leg) Wound Laterality: Left, Posterior Cleanser Peri-Wound Care Topical Primary Dressing Hydrofera Blue Ready Transfer Foam, 2.5x2.5 (in/in) [name] [name], [name] [name] ([id number]) Discharge Instruction: Apply Hydrofera Blue Ready to wound bed as directed Secondary Dressing Xtrasorb Medium 4x5 (in/in) Discharge Instruction: Apply to wound as directed. Do not cut. Secured With Compression Wrap Profore Lite LF 3 Multilayer Compression Bandaging System Discharge Instruction: Apply 3 multi-layer wrap as prescribed. Compression Stockings Add-Ons Electronic Signature(s) Signed: 12/17/2020 5:08:01 PM By: Carlene Coria RN Entered By: Carlene Coria on 12/12/2020 09:52:15 [name] [name] ([id number]) -------------------------------------------------------------------------------- Vitals Details Patient Name: [name] [name] Date of Service: 12/12/2020 9:00 AM Medical Record Number: [id number] Patient Account Number: [id number] Date of Birth/Sex: [birth year] (87 y.o. F) Treating RN: Carlene Coria Primary Care Krystol Rocco: Tracie Harrier Other Clinician: Referring Horice Carrero: Tracie Harrier Treating Rynn Markiewicz/Extender: Tito Dine in Treatment: 1 Vital Signs Time Taken: 09:34 Temperature (F): 97.9 Height (in): 63 Pulse (bpm): 67 Weight (lbs):  174 Respiratory Rate (breaths/min): 20 Body Mass Index (BMI): 30.8 Blood Pressure (mmHg): 108/62 Reference Range: 80 - 120 mg / dl Electronic Signature(s) Signed: 12/17/2020 5:08:01 PM By: Carlene Coria RN Entered By: Carlene Coria on 12/12/2020 09:35:14

## 2020-12-18 ENCOUNTER — Emergency Department
Admission: EM | Admit: 2020-12-18 | Discharge: 2020-12-18 | Disposition: A | Discharge status: Home / Self Care | Payer: PPO | Attending: Emergency Medicine | Admitting: Emergency Medicine

## 2020-12-18 ENCOUNTER — Emergency Department: Payer: PPO

## 2020-12-18 ENCOUNTER — Other Ambulatory Visit: Payer: Self-pay

## 2020-12-18 ENCOUNTER — Encounter: Payer: Self-pay | Admitting: Emergency Medicine

## 2020-12-18 DIAGNOSIS — Z96659 Presence of unspecified artificial knee joint: Secondary | ICD-10-CM | POA: Insufficient documentation

## 2020-12-18 DIAGNOSIS — Z79899 Other long term (current) drug therapy: Secondary | ICD-10-CM | POA: Insufficient documentation

## 2020-12-18 DIAGNOSIS — Z7901 Long term (current) use of anticoagulants: Secondary | ICD-10-CM | POA: Diagnosis not present

## 2020-12-18 DIAGNOSIS — I509 Heart failure, unspecified: Secondary | ICD-10-CM | POA: Diagnosis not present

## 2020-12-18 DIAGNOSIS — Z86718 Personal history of other venous thrombosis and embolism: Secondary | ICD-10-CM | POA: Insufficient documentation

## 2020-12-18 DIAGNOSIS — Z950 Presence of cardiac pacemaker: Secondary | ICD-10-CM | POA: Insufficient documentation

## 2020-12-18 DIAGNOSIS — I110 Hypertensive heart disease with heart failure: Secondary | ICD-10-CM | POA: Insufficient documentation

## 2020-12-18 DIAGNOSIS — Z87891 Personal history of nicotine dependence: Secondary | ICD-10-CM | POA: Insufficient documentation

## 2020-12-18 DIAGNOSIS — J45909 Unspecified asthma, uncomplicated: Secondary | ICD-10-CM | POA: Insufficient documentation

## 2020-12-18 DIAGNOSIS — J441 Chronic obstructive pulmonary disease with (acute) exacerbation: Secondary | ICD-10-CM | POA: Diagnosis not present

## 2020-12-18 DIAGNOSIS — I480 Paroxysmal atrial fibrillation: Secondary | ICD-10-CM | POA: Diagnosis not present

## 2020-12-18 DIAGNOSIS — I2510 Atherosclerotic heart disease of native coronary artery without angina pectoris: Secondary | ICD-10-CM | POA: Diagnosis not present

## 2020-12-18 DIAGNOSIS — I517 Cardiomegaly: Secondary | ICD-10-CM | POA: Diagnosis not present

## 2020-12-18 DIAGNOSIS — R001 Bradycardia, unspecified: Secondary | ICD-10-CM | POA: Diagnosis not present

## 2020-12-18 DIAGNOSIS — J90 Pleural effusion, not elsewhere classified: Secondary | ICD-10-CM | POA: Diagnosis not present

## 2020-12-18 DIAGNOSIS — R0602 Shortness of breath: Secondary | ICD-10-CM | POA: Diagnosis not present

## 2020-12-18 LAB — BASIC METABOLIC PANEL
Anion gap: 9 (ref 5–15)
BUN: 38 mg/dL — ABNORMAL HIGH (ref 8–23)
CO2: 27 mmol/L (ref 22–32)
Calcium: 9.3 mg/dL (ref 8.9–10.3)
Chloride: 103 mmol/L (ref 98–111)
Creatinine, Ser: 1.31 mg/dL — ABNORMAL HIGH (ref 0.44–1.00)
GFR, Estimated: 38 mL/min — ABNORMAL LOW (ref >60)
Glucose, Bld: 126 mg/dL — ABNORMAL HIGH (ref 70–99)
Potassium: 4.5 mmol/L (ref 3.5–5.1)
Sodium: 139 mmol/L (ref 135–145)

## 2020-12-18 LAB — PROTIME-INR
INR: 1.5 — ABNORMAL HIGH (ref 0.8–1.2)
Prothrombin Time: 17.3 seconds — ABNORMAL HIGH (ref 11.4–15.2)

## 2020-12-18 LAB — CBC WITH DIFFERENTIAL/PLATELET
Abs Immature Granulocytes: 0.03 10*3/uL (ref 0.00–0.07)
Basophils Absolute: 0 10*3/uL (ref 0.0–0.1)
Basophils Relative: 0 %
Eosinophils Absolute: 0 10*3/uL (ref 0.0–0.5)
Eosinophils Relative: 0 %
HCT: 37.2 % (ref 36.0–46.0)
Hemoglobin: 11.9 g/dL — ABNORMAL LOW (ref 12.0–15.0)
Immature Granulocytes: 0 %
Lymphocytes Relative: 10 %
Lymphs Abs: 0.8 10*3/uL (ref 0.7–4.0)
MCH: 31.5 pg (ref 26.0–34.0)
MCHC: 32 g/dL (ref 30.0–36.0)
MCV: 98.4 fL (ref 80.0–100.0)
Monocytes Absolute: 0.6 10*3/uL (ref 0.1–1.0)
Monocytes Relative: 7 %
Neutro Abs: 6.5 10*3/uL (ref 1.7–7.7)
Neutrophils Relative %: 83 %
Platelets: 165 10*3/uL (ref 150–400)
RBC: 3.78 MIL/uL — ABNORMAL LOW (ref 3.87–5.11)
RDW: 15.3 % (ref 11.5–15.5)
WBC: 7.9 10*3/uL (ref 4.0–10.5)
nRBC: 0 % (ref 0.0–0.2)

## 2020-12-18 LAB — MAGNESIUM: Magnesium: 2.3 mg/dL (ref 1.7–2.4)

## 2020-12-18 LAB — BRAIN NATRIURETIC PEPTIDE: B Natriuretic Peptide: 493.6 pg/mL — ABNORMAL HIGH (ref 0.0–100.0)

## 2020-12-18 LAB — TROPONIN I (HIGH SENSITIVITY)
Troponin I (High Sensitivity): 18 ng/L — ABNORMAL HIGH (ref <18)
Troponin I (High Sensitivity): 19 ng/L — ABNORMAL HIGH (ref <18)

## 2020-12-18 MED ORDER — PREDNISONE 50 MG PO TABS: 50.0000 mg | ORAL_TABLET | Freq: Every day | ORAL | 0 refills | Status: AC

## 2020-12-18 MED ORDER — IPRATROPIUM-ALBUTEROL 0.5-2.5 (3) MG/3ML IN SOLN
RESPIRATORY_TRACT | Status: AC
  Administered 2020-12-18: 9 mL via RESPIRATORY_TRACT
  Filled 2020-12-18: qty 6

## 2020-12-18 MED ORDER — PREDNISONE 20 MG PO TABS
60.0000 mg | ORAL_TABLET | Freq: Once | ORAL | Status: AC
  Administered 2020-12-18: 60 mg via ORAL
  Filled 2020-12-18: qty 3

## 2020-12-18 MED ORDER — IPRATROPIUM-ALBUTEROL 0.5-2.5 (3) MG/3ML IN SOLN
9.0000 mL | Freq: Once | RESPIRATORY_TRACT | Status: AC
  Administered 2020-12-18: 9 mL via RESPIRATORY_TRACT
  Filled 2020-12-18: qty 3

## 2020-12-18 NOTE — ED Notes (Signed)
Report from pacemaker interrogation reported to EDP and states pt can f/up outpatient.

## 2020-12-18 NOTE — ED Triage Notes (Signed)
Patient presents to the ED with shortness of breath that has increased/worsened yesterday and today.  Patient has audible expiratory wheezes.  Patient states, "maybe I'm allergic to something in my house."  Patient denies any fever.  Reports some chest pain with deep breathing.  Patient reports increased fluid weeping in legs.  This is an ongoing problem for her and she goes regularly to the wound care clinic.

## 2020-12-18 NOTE — ED Notes (Signed)
Pt presents to ED with c/o of SOB and wheezing. Pt does have audible exp wheezes in all lobes. Pt states SOB is worse on exertion. Pt states having a productive cough at this time. Pt denies N/V/D. Pt denies fevers and chills. Pt states recently RX'ed ABX and prednisone and states she has not started taking them yet. Pt is A&Ox4 and has a steady gait and ambulatory with a cane. Pt's O2 sat does not drop during ambulation. Pt denies any new lower extremity swelling.  Pt also c/o of dysuria.

## 2020-12-18 NOTE — Consult Note (Signed)
[facility] Consultation Note  Patient ID: [name] [name], MRN: [id number], DOB/AGE: [birth year] 87 y.o. Admit date: 12/18/2020   Date of Consult: 12/18/2020 Primary Physician: Tracie Harrier, MD Primary Cardiologist: Paraschos  Chief Complaint:  Chief Complaint  Patient presents with  . Shortness of Breath   Reason for Consult: Atrial fibrillation  HPI: 87 y.o. female with known chronic nonvalvular atrial fibrillation with sick sinus syndrome status post recent pacemaker generator change out coronary artery disease valvular heart disease hypertension hyperlipidemia.  Recently the patient has had a generator change of her pacemaker for which went well and has had no evidence of issues events.  She is now been in chronic nonvalvular atrial fibrillation with appropriate medication management including diltiazem and metoprolol.  Heart rate has been relatively controlled but has a recent Holter monitor showing significant amount of preventricular contractions as well.  There was no other significant rhythm disturbances.  The patient additionally has had an echocardiogram showing normal LV systolic function with moderate valvular heart disease and pulmonary hypertension.  She also has lower extremity edema with concerns of use of furosemide metolazone for this lower extremity edema likely secondary to pulmonary hypertension from lung disease.  Recently she has had a significant worsening bout of cough and congestion and possible COPD exacerbation.  With this the patient still has relatively controlled heart rate with preventricular contractions.  On observation with EKG shows atrial fibrillation with some ventricular pacing and some nonsensing of native ventricular contractions.  There appears not to be any concerns at this time and there is no evidence of symptomatic bradycardia and/or other rhythm disturbances  Past Medical History:  Diagnosis Date  . Anemia   . Arrhythmia    atrial  fibrillation  . Asthma   . Breast cancer ([place]) 1978   left  . CAD (coronary artery disease)   . CHF (congestive heart failure) ([place])   . COPD (chronic obstructive pulmonary disease) ([place])   . DVT (deep venous thrombosis) ([place])   . Esophageal stricture   . Hemorrhoids   . History of knee replacement, total   . Hyperlipidemia   . Hypertension   . Ischemic cardiomyopathy   . Osteoporosis   . Paroxysmal atrial fibrillation (HCC)   . Peripheral arterial disease ([place])   . Sick sinus syndrome [facility])       Surgical History:  Past Surgical History:  Procedure Laterality Date  . ABDOMINAL HYSTERECTOMY  1985  . HEMORRHOID SURGERY    . ILIAC ARTERY STENT    . INTRAOPERATIVE ARTERIOGRAM Right   . MASTECTOMY Left 1978  . PACEMAKER INSERTION Left 01/11/2020   Procedure: PACEMAKER GENERATOR CHANGE OUT;  Surgeon: Isaias Cowman, MD;  Location: [facility] ORS;  Service: Cardiovascular;  Laterality: Left;  . REDUCTION MAMMAPLASTY Right 1985   impant placed then removed     Home Meds: Prior to Admission medications   Medication Sig Start Date End Date Taking? Authorizing Provider  albuterol (PROVENTIL HFA;VENTOLIN HFA) 108 (90 BASE) MCG/ACT inhaler Inhale 2 puffs into the lungs every 6 (six) hours as needed for wheezing or shortness of breath.    [provider]  alendronate (FOSAMAX) 70 MG tablet Take 70 mg by mouth once a week. Take with a full glass of water on an empty stomach.    [provider]  atorvastatin (LIPITOR) 40 MG tablet Take 40 mg by mouth every evening.    [provider]  budesonide-formoterol (SYMBICORT) 160-4.5 MCG/ACT inhaler Inhale 2 puffs into the  lungs 2 (two) times daily. 07/27/18   Wilhelmina Mcardle, MD  diltiazem (DILTIAZEM CD) 180 MG 24 hr capsule Take 180 mg by mouth daily.    [provider]  furosemide (LASIX) 20 MG tablet Take 2 tablets (40 mg total) by mouth 2 (two) times daily. Take a second dose at 1-2 PM Patient taking  differently: Take 40 mg by mouth daily.  07/27/18   Wilhelmina Mcardle, MD  ipratropium-albuterol (DUONEB) 0.5-2.5 (3) MG/3ML SOLN Take 3 mLs by nebulization every 4 (four) hours as needed.    [provider]  metolazone (ZAROXOLYN) 2.5 MG tablet Take 2.5 mg by mouth as directed. Pt takes every other Monday. 09/21/18   [provider]  metoprolol succinate (TOPROL-XL) 100 MG 24 hr tablet Take 50 mg by mouth daily. Take with or immediately following a meal.     [provider]  omeprazole (PRILOSEC) 40 MG capsule Take 40 mg by mouth daily.    [provider]  oxybutynin (DITROPAN) 5 MG tablet Take 5 mg by mouth 3 (three) times daily. 01/06/17   [provider]  potassium chloride (KLOR-CON M10) 10 MEQ tablet Take 1 tablet (10 mEq total) by mouth daily. Patient taking differently: Take 20 mEq by mouth daily.  08/16/18   Wilhelmina Mcardle, MD  quinapril (ACCUPRIL) 40 MG tablet Take 40 mg by mouth daily.    [provider]  tiotropium (SPIRIVA) 18 MCG inhalation capsule Place 18 mcg into inhaler and inhale daily.    [provider]  warfarin (COUMADIN) 5 MG tablet Take 5 mg by mouth daily.    [provider]    Inpatient Medications:  . ipratropium-albuterol         Allergies:  Allergies  Allergen Reactions  . Penicillins Anaphylaxis  . Amoxicillin   . Beta Adrenergic Blockers   . Meloxicam     Other reaction(s): Unknown  . Naproxen     Other reaction(s): Unknown  . Nsaids     Other reaction(s): Unknown    Social History   Socioeconomic History  . Marital status: Widowed    Spouse name: Not on file  . Number of children: Not on file  . Years of education: Not on file  . Highest education level: Not on file  Occupational History  . Not on file  Tobacco Use  . Smoking status: Former Smoker    Types: Cigarettes    Quit date: 12/05/1986    Years since quitting: 34.0  . Smokeless tobacco: Never Used  Vaping Use   . Vaping Use: Never used  Substance and Sexual Activity  . Alcohol use: No    Alcohol/week: 0.0 standard drinks  . Drug use: No  . Sexual activity: Not on file  Other Topics Concern  . Not on file  Social History Narrative  . Not on file   Social Determinants of Health   Financial Resource Strain: Not on file  Food Insecurity: Not on file  Transportation Needs: Not on file  Physical Activity: Not on file  Stress: Not on file  Social Connections: Not on file  Intimate Partner Violence: Not on file     Family History  Problem Relation Age of Onset  . Hypertension Father   . Heart disease Father   . Clotting disorder Father   . Cancer Mother        breast  . Heart disease Sister   . Thyroid disease Sister   . Diabetes Sister   .  Breast cancer Daughter        34's, twice diagnosed, double mastectomy  . Breast cancer Daughter        40's     Review of Systems Positive for cough congestion Negative for: General:  chills, fever, night sweats or weight changes.  Cardiovascular: PND orthopnea syncope dizziness  Dermatological skin lesions rashes Respiratory: Positive for cough congestion Urologic: Frequent urination urination at night and hematuria Abdominal: negative for nausea, vomiting, diarrhea, bright red blood per rectum, melena, or hematemesis Neurologic: negative for visual changes, and/or hearing changes  All other systems reviewed and are otherwise negative except as noted above.  Labs: No results for input(s): CKTOTAL, CKMB, TROPONINI in the last 72 hours. Lab Results  Component Value Date   WBC 7.9 12/18/2020   HGB 11.9 (L) 12/18/2020   HCT 37.2 12/18/2020   MCV 98.4 12/18/2020   PLT 165 12/18/2020   No results for input(s): NA, K, CL, CO2, BUN, CREATININE, CALCIUM, PROT, BILITOT, ALKPHOS, ALT, AST, GLUCOSE in the last 168 hours.  Invalid input(s): LABALBU No results found for: CHOL, HDL, LDLCALC, TRIG No results found for:  DDIMER  Radiology/Studies:  DG Chest 2 View  Result Date: 12/18/2020 CLINICAL DATA:  Increasing shortness of breath yesterday and today. EXAM: CHEST - 2 VIEW COMPARISON:  PA and lateral chest 09/18/2020. FINDINGS: The left lung is clear. Small right pleural effusion has decreased since the prior examination. There is cardiomegaly without edema. No pneumothorax. Aortic atherosclerosis is noted. IMPRESSION: Small right pleural effusion is improved compared to the prior study. No new abnormality. Cardiomegaly. Aortic Atherosclerosis (ICD10-I70.0). Electronically Signed   By: Inge Rise M.D.   On: 12/18/2020 13:09    EKG: Atrial fibrillation with ventricular pacing and native ventricular rates with undersensing  Weights: Filed Weights   12/18/20 1207  Weight: 77.1 kg     Physical Exam: Blood pressure 135/80, pulse 70, temperature 97.8 F (36.6 C), temperature source Oral, resp. rate 17, height 5\' 3"  (1.6 m), weight 77.1 kg, SpO2 98 %. Body mass index is 30.11 kg/m. General: Well developed, well nourished, in no acute distress. Head eyes ears nose throat: Normocephalic, atraumatic, sclera non-icteric, no xanthomas, nares are without discharge. No apparent thyromegaly and/or mass  Lungs: Normal respiratory effort.  Diffuse wheezes, no rales some rhonchi.  Heart: Irregular with normal S1 S2. no murmur gallop, no rub, PMI is normal size and placement, carotid upstroke normal without bruit, jugular venous pressure is normal Abdomen: Soft, non-tender, non-distended with normoactive bowel sounds. No hepatomegaly. No rebound/guarding. No obvious abdominal masses. Abdominal aorta is normal size without bruit Extremities: Trace to 1+ edema. no cyanosis, no clubbing, no ulcers  Peripheral : 2+ bilateral upper extremity pulses, 2+ bilateral femoral pulses, 2+ bilateral dorsal pedal pulse Neuro: Alert and oriented. No facial asymmetry. No focal deficit. Moves all extremities  spontaneously. Musculoskeletal: Normal muscle tone without kyphosis Psych:  Responds to questions appropriately with a normal affect.    Assessment: 87 year old female with acute exacerbation of COPD and/or asthma likely causing some hypoxia and cough and congestion now improved with inhalers having chronic nonvalvular atrial fibrillation hypertension hyperlipidemia with recent pacemaker battery change out and no evidence of significant rhythm disturbances other than mentioned above and pacemaker appears to be under sensing without any other concerns  Plan: 1.  Continue medication management and treatment of COPD exacerbation with appropriate inhalers 2.  Continue metoprolol diltiazem for heart rate control of atrial fibrillation 3.  Continue pacemaker and further  interrogation for evaluation of possible undersensing for which the patient may need some reprogramming currently without any significant concerns which can be done as an outpatient after discharge from the emergency room 4.  Continue anticoagulation for further risk reduction stroke with atrial fibrillation without change 5.  No further cardiac diagnostics necessary at this time  Signed, Corey Skains M.D. [facility] 12/18/2020, 1:58 PM

## 2020-12-18 NOTE — ED Notes (Signed)
D/C, new RX discussed with pt and daughter, both verbalized understanding. NAD noted.

## 2020-12-18 NOTE — ED Notes (Signed)
Assisted pt to the restroom 

## 2020-12-18 NOTE — ED Provider Notes (Signed)
[facility] Emergency Department Provider Note   ____________________________________________   Event Date/Time   First MD Initiated Contact with Patient 12/18/20 1212     (approximate)  I have reviewed the triage vital signs and the nursing notes.   HISTORY  Chief Complaint Shortness of Breath    HPI [name] [name] is a 87 y.o. female with past medical history of hypertension, CAD, CHF, COPD, paroxysmal atrial fibrillation on Coumadin, and sick sinus syndrome status post pacemaker who presents to the ED complaining of shortness of breath.  Patient reports that she has had a couple of days and increasing dry cough as well as difficulty catching her breath.  She has had some pain in her chest when she coughs or takes a deep breath.  She has had issues with swelling in her legs and chronic wounds to her lower extremities, although daughter states this is much improved recently.  She follows closely at the wound care clinic and her wounds have been gradually healing with improvement and lower extremity swelling.  She has not had any fevers and denies any nausea, vomiting, abdominal pain, or changes in bowel movements.  She was seen at the walk-in clinic a couple of days ago and prescribed a course of steroids as well as azithromycin.  She has held off on taking these as her daughter was not sure that she should.  She has previously been prescribed chronic prednisone by her pulmonologist.        Past Medical History:  Diagnosis Date  . Anemia   . Arrhythmia    atrial fibrillation  . Asthma   . Breast cancer ([place]) 1978   left  . CAD (coronary artery disease)   . CHF (congestive heart failure) ([place])   . COPD (chronic obstructive pulmonary disease) ([place])   . DVT (deep venous thrombosis) ([place])   . Esophageal stricture   . Hemorrhoids   . History of knee replacement, total   . Hyperlipidemia   . Hypertension   . Ischemic cardiomyopathy   . Osteoporosis   .  Paroxysmal atrial fibrillation (HCC)   . Peripheral arterial disease ([place])   . Sick sinus syndrome [facility])     Patient Active Problem List   Diagnosis Date Noted  . Esophageal stricture 07/12/2018  . Osteoporosis 07/12/2018  . Urge urinary incontinence 07/12/2018  . Hypersensitivity pneumonitis due to unspecified organic dust ([place]) 05/18/2018  . VHD (valvular heart disease) 05/18/2018  . Chest discomfort 03/16/2017  . Atrial fibrillation ([place]) 03/23/2015  . SOB (shortness of breath) 03/06/2014  . Essential (primary) hypertension 12/29/2013  . Hyperlipidemia, unspecified 12/29/2013  . Pacemaker 10/26/2007    Past Surgical History:  Procedure Laterality Date  . ABDOMINAL HYSTERECTOMY  1985  . HEMORRHOID SURGERY    . ILIAC ARTERY STENT    . INTRAOPERATIVE ARTERIOGRAM Right   . MASTECTOMY Left 1978  . PACEMAKER INSERTION Left 01/11/2020   Procedure: PACEMAKER GENERATOR CHANGE OUT;  Surgeon: Isaias Cowman, MD;  Location: [facility] ORS;  Service: Cardiovascular;  Laterality: Left;  . REDUCTION MAMMAPLASTY Right 1985   impant placed then removed    Prior to Admission medications   Medication Sig Start Date End Date Taking? Authorizing Provider  albuterol (PROVENTIL HFA;VENTOLIN HFA) 108 (90 BASE) MCG/ACT inhaler Inhale 2 puffs into the lungs every 6 (six) hours as needed for wheezing or shortness of breath.    [provider]  alendronate (FOSAMAX) 70 MG tablet Take 70 mg by mouth once a  week. Take with a full glass of water on an empty stomach.    [provider]  atorvastatin (LIPITOR) 40 MG tablet Take 40 mg by mouth every evening.    [provider]  budesonide-formoterol (SYMBICORT) 160-4.5 MCG/ACT inhaler Inhale 2 puffs into the lungs 2 (two) times daily. 07/27/18   Wilhelmina Mcardle, MD  diltiazem (DILTIAZEM CD) 180 MG 24 hr capsule Take 180 mg by mouth daily.    [provider]  furosemide (LASIX) 20 MG tablet Take 2 tablets (40 mg total) by  mouth 2 (two) times daily. Take a second dose at 1-2 PM Patient taking differently: Take 40 mg by mouth daily.  07/27/18   Wilhelmina Mcardle, MD  ipratropium-albuterol (DUONEB) 0.5-2.5 (3) MG/3ML SOLN Take 3 mLs by nebulization every 4 (four) hours as needed.    [provider]  metolazone (ZAROXOLYN) 2.5 MG tablet Take 2.5 mg by mouth as directed. Pt takes every other Monday. 09/21/18   [provider]  metoprolol succinate (TOPROL-XL) 100 MG 24 hr tablet Take 50 mg by mouth daily. Take with or immediately following a meal.     [provider]  omeprazole (PRILOSEC) 40 MG capsule Take 40 mg by mouth daily.    [provider]  oxybutynin (DITROPAN) 5 MG tablet Take 5 mg by mouth 3 (three) times daily. 01/06/17   [provider]  potassium chloride (KLOR-CON M10) 10 MEQ tablet Take 1 tablet (10 mEq total) by mouth daily. Patient taking differently: Take 20 mEq by mouth daily.  08/16/18   Wilhelmina Mcardle, MD  quinapril (ACCUPRIL) 40 MG tablet Take 40 mg by mouth daily.    [provider]  tiotropium (SPIRIVA) 18 MCG inhalation capsule Place 18 mcg into inhaler and inhale daily.    [provider]  warfarin (COUMADIN) 5 MG tablet Take 5 mg by mouth daily.    [provider]    Allergies Penicillins, Amoxicillin, Beta adrenergic blockers, Meloxicam, Naproxen, and Nsaids  Family History  Problem Relation Age of Onset  . Hypertension Father   . Heart disease Father   . Clotting disorder Father   . Cancer Mother        breast  . Heart disease Sister   . Thyroid disease Sister   . Diabetes Sister   . Breast cancer Daughter        61's, twice diagnosed, double mastectomy  . Breast cancer Daughter        49's    Social History Social History   Tobacco Use  . Smoking status: Former Smoker    Types: Cigarettes    Quit date: 12/05/1986    Years since quitting: 34.0  . Smokeless tobacco: Never Used  Vaping Use  . Vaping  Use: Never used  Substance Use Topics  . Alcohol use: No    Alcohol/week: 0.0 standard drinks  . Drug use: No    Review of Systems  Constitutional: No fever/chills Eyes: No visual changes. ENT: No sore throat. Cardiovascular: Positive for chest pain. Respiratory: Positive for cough and shortness of breath. Gastrointestinal: No abdominal pain.  No nausea, no vomiting.  No diarrhea.  No constipation. Genitourinary: Negative for dysuria. Musculoskeletal: Negative for back pain. Skin: Negative for rash. Neurological: Negative for headaches, focal weakness or numbness.  ____________________________________________   PHYSICAL EXAM:  VITAL SIGNS: ED Triage Vitals  Enc Vitals Group     BP 12/18/20 1205 126/66     Pulse Rate 12/18/20 1205  76     Resp 12/18/20 1205 (!) 24     Temp 12/18/20 1205 97.8 F (36.6 C)     Temp Source 12/18/20 1205 Oral     SpO2 12/18/20 1205 100 %     Weight 12/18/20 1207 170 lb (77.1 kg)     Height 12/18/20 1207 5\' 3"  (1.6 m)     Head Circumference --      Peak Flow --      Pain Score 12/18/20 1206 4     Pain Loc --      Pain Edu? --      Excl. in [place]? --     Constitutional: Alert and oriented. Eyes: Conjunctivae are normal. Head: Atraumatic. Nose: No congestion/rhinnorhea. Mouth/Throat: Mucous membranes are moist. Neck: Normal ROM Cardiovascular: Normal rate, irregularly irregular rhythm. Grossly normal heart sounds. Respiratory: Normal respiratory effort.  No retractions. Lungs with expiratory wheezing throughout. Gastrointestinal: Soft and nontender. No distention. Genitourinary: deferred Musculoskeletal: Dressings in place to bilateral lower extremities with healing chronic wounds, trace edema bilaterally with no tenderness. Neurologic:  Normal speech and language. No gross focal neurologic deficits are appreciated. Skin:  Skin is warm, dry and intact. No rash noted. Psychiatric: Mood and affect are normal. Speech and behavior are  normal.  ____________________________________________   LABS (all labs ordered are listed, but only abnormal results are displayed)  Labs Reviewed  CBC WITH DIFFERENTIAL/PLATELET - Abnormal; Notable for the following components:      Result Value   RBC 3.78 (*)    Hemoglobin 11.9 (*)    All other components within normal limits  PROTIME-INR - Abnormal; Notable for the following components:   Prothrombin Time 17.3 (*)    INR 1.5 (*)    All other components within normal limits  BRAIN NATRIURETIC PEPTIDE - Abnormal; Notable for the following components:   B Natriuretic Peptide 493.6 (*)    All other components within normal limits  BASIC METABOLIC PANEL - Abnormal; Notable for the following components:   Glucose, Bld 126 (*)    BUN 38 (*)    Creatinine, Ser 1.31 (*)    GFR, Estimated 38 (*)    All other components within normal limits  TROPONIN I (HIGH SENSITIVITY) - Abnormal; Notable for the following components:   Troponin I (High Sensitivity) 18 (*)    All other components within normal limits  MAGNESIUM  TROPONIN I (HIGH SENSITIVITY)   ____________________________________________  EKG  ED ECG REPORT I, Blake Divine, the attending physician, personally viewed and interpreted this ECG.   Date: 12/18/2020  EKG Time: 12:21  Rate: 92  Rhythm: Ventricular Paced Rhythm  Axis: Normal  Intervals:none  ST&T Change: None   PROCEDURES  Procedure(s) performed (including Critical Care):  Procedures   ____________________________________________   INITIAL IMPRESSION / ASSESSMENT AND PLAN / ED COURSE       87 year old female with past medical history of hypertension, CAD, CHF, COPD, paroxysmal atrial fibrillation on Coumadin, and sick sinus syndrome status post pacemaker who presents to the ED with increasing cough and difficulty breathing over the past 3 to 4 days.  Patient with wheezing on exam but is not in any respiratory distress and maintaining O2 sat on room  air.  Symptoms appear consistent with COPD exacerbation and we will treat with prednisone as well as DuoNeb's.  Chest x-ray reviewed by me and shows no infiltrate, edema, or effusion.  EKG shows a regular rhythm with inconsistent pacing, but no acute ischemic changes noted.  EKG reviewed with Dr. Nehemiah Massed of cardiology, who states that findings are likely related to under sensing by pacemaker.  This is unlikely to be related to her current presentation and per cardiology patient may follow-up in the clinic for any pacemaker adjustments.  Troponin is very mildly elevated and low suspicion for ACS at this time.  I also have a low suspicion for PE.  Patient reports feeling much better following duo nebs and steroids, wheezing is improved on reevaluation.  Additional labs are reassuring, renal function stable from previous.  Repeat troponin is pending at this time but if this is within normal limits, patient would be appropriate for discharge home with pulmonology and cardiology follow-up.  Patient and daughter agree with plan.  Patient turned over to oncoming provider pending repeat troponin.      ____________________________________________   FINAL CLINICAL IMPRESSION(S) / ED DIAGNOSES  Final diagnoses:  COPD exacerbation [facility])     ED Discharge Orders    None       Note:  This document was prepared using Dragon voice recognition software and may include unintentional dictation errors.   Blake Divine, MD 12/18/20 [id number]

## 2020-12-19 ENCOUNTER — Encounter: Payer: PPO | Admitting: Internal Medicine

## 2020-12-19 DIAGNOSIS — L97212 Non-pressure chronic ulcer of right calf with fat layer exposed: Secondary | ICD-10-CM | POA: Diagnosis not present

## 2020-12-19 DIAGNOSIS — L97221 Non-pressure chronic ulcer of left calf limited to breakdown of skin: Secondary | ICD-10-CM | POA: Diagnosis not present

## 2020-12-19 DIAGNOSIS — I87333 Chronic venous hypertension (idiopathic) with ulcer and inflammation of bilateral lower extremity: Secondary | ICD-10-CM | POA: Diagnosis not present

## 2020-12-20 DIAGNOSIS — J679 Hypersensitivity pneumonitis due to unspecified organic dust: Secondary | ICD-10-CM | POA: Diagnosis not present

## 2020-12-20 DIAGNOSIS — R7309 Other abnormal glucose: Secondary | ICD-10-CM | POA: Diagnosis not present

## 2020-12-20 DIAGNOSIS — I10 Essential (primary) hypertension: Secondary | ICD-10-CM | POA: Diagnosis not present

## 2020-12-20 DIAGNOSIS — I5032 Chronic diastolic (congestive) heart failure: Secondary | ICD-10-CM | POA: Diagnosis not present

## 2020-12-20 DIAGNOSIS — R262 Difficulty in walking, not elsewhere classified: Secondary | ICD-10-CM | POA: Diagnosis not present

## 2020-12-20 DIAGNOSIS — J440 Chronic obstructive pulmonary disease with acute lower respiratory infection: Secondary | ICD-10-CM | POA: Diagnosis not present

## 2020-12-20 DIAGNOSIS — I480 Paroxysmal atrial fibrillation: Secondary | ICD-10-CM | POA: Diagnosis not present

## 2020-12-20 NOTE — Progress Notes (Signed)
[name] [name], [name] [name] ([id number]) Visit Report for 12/19/2020 HPI Details Patient Name: [name] [name], [name] [name]. Date of Service: 12/19/2020 9:00 AM Medical Record Number: [id number] Patient Account Number: [id number] Date of Birth/Sex: 08-19-1927 (87 y.o. F) Treating RN: Cornell Barman Primary Care Provider: Tracie Harrier Other Clinician: Jeanine Luz Referring Provider: Tracie Harrier Treating Provider/Extender: Tito Dine in Treatment: 2 History of Present Illness HPI Description: ADMISSION 12/05/2020 This is a 87 year old woman who arrived accompanied by her daughter from [place]. She has been dealing with wounds on her bilateral posterior calf since January. There is no obvious antecedent injury. Her daughter tells me that she is actually had wounds that have opened and closed on her legs since last summer. She currently she has 2 wounds on the right posterior calf and 1 on the left. They have been using Vaseline on the wounds and covering the area with kerlix but they have not been healing. The patient has been wearing what appears to be support stockings. Past medical history includes paroxysmal A. fib, pacemaker,, hypertension, PVD, COPD, left mastectomy chronic lower extremity swelling ABIs were noncompressible bilateral 3/23; 1 week follow-up. Bilateral venous insufficiency ulcers right greater than left. As opposed to what I was worried about last week she actually tolerated 3 layer compression. We used Hydrofera Blue 3/30; bilateral venous insufficiency ulcers right greater than left. The area on the left is healed this week still 2 areas on the posterior right calf that are both measuring smaller. We have been using Hydrofera Blue under 3 layer compression she is. She seems to be tolerating this Electronic Signature(s) Signed: 12/20/2020 8:16:57 AM By: Linton Ham MD Entered By: Linton Ham on 12/19/2020 10:05:53 [name] [name]  ([id number]) -------------------------------------------------------------------------------- Physical Exam Details Patient Name: [name] [name] Date of Service: 12/19/2020 9:00 AM Medical Record Number: [id number] Patient Account Number: [id number] Date of Birth/Sex: 07-Jul-1927 (87 y.o. F) Treating RN: Cornell Barman Primary Care Provider: Tracie Harrier Other Clinician: Jeanine Luz Referring Provider: Tracie Harrier Treating Provider/Extender: Tito Dine in Treatment: 2 Constitutional Patient is hypertensive.. Pulse regular and within target range for patient.Marland Kitchen Respirations regular, non-labored and within target range.. Temperature is normal and within the target range for the patient.Marland Kitchen appears in no distress. Cardiovascular Pedal pulses palpable and strong bilaterally.. Edema is well controlled bilaterally. Notes Wound exam; left posterior calf is closed. I used a #3 curette to remove some surface debris there is nothing open underneath this healthy looking new epithelialization oOn the right posterior 2 wounds remain. There is some slough on the surface although they are measurably smaller and I elected not to debride these today. Electronic Signature(s) Signed: 12/20/2020 8:16:57 AM By: Linton Ham MD Entered By: Linton Ham on 12/19/2020 10:07:18 [name] [name] ([id number]) -------------------------------------------------------------------------------- Physician Orders Details Patient Name: [name] [name] Date of Service: 12/19/2020 9:00 AM Medical Record Number: [id number] Patient Account Number: [id number] Date of Birth/Sex: 13-Sep-1927 (87 y.o. F) Treating RN: Cornell Barman Primary Care Provider: Tracie Harrier Other Clinician: Jeanine Luz Referring Provider: Tracie Harrier Treating Provider/Extender: Tito Dine in Treatment: 2 Verbal / Phone Orders: No Diagnosis Coding Follow-up Appointments o Return Appointment in 1  week. o Nurse Visit as needed Bathing/ Shower/ Hygiene o May shower with wound dressing protected with water repellent cover or cast protector. Edema Control - Lymphedema / Segmental Compressive Device / Other Bilateral Lower Extremities o Optional: One layer of unna paste to top of compression wrap (to act as an anchor). o Elevate, Exercise  Daily and Avoid Standing for Long Periods of Time. o Elevate legs to the level of the heart and pump ankles as often as possible o Elevate leg(s) parallel to the floor when sitting. Additional Orders / Instructions o Follow Nutritious Diet and Increase Protein Intake Wound Treatment Wound #1 - Lower Leg Wound Laterality: Right, Posterior, Proximal Primary Dressing: Hydrofera Blue Ready Transfer Foam, 2.5x2.5 (in/in) 1 x Per Week/15 Days Discharge Instructions: Apply Hydrofera Blue Ready to wound bed as directed Secondary Dressing: Xtrasorb Medium 4x5 (in/in) 1 x Per Week/15 Days Discharge Instructions: Apply to wound as directed. Do not cut. Compression Wrap: Profore Lite LF 3 Multilayer Compression Bandaging System 1 x Per Week/15 Days Discharge Instructions: Apply 3 multi-layer wrap as prescribed. Wound #2 - Lower Leg Wound Laterality: Right, Posterior, Distal Primary Dressing: Hydrofera Blue Ready Transfer Foam, 2.5x2.5 (in/in) 1 x Per Week/15 Days Discharge Instructions: Apply Hydrofera Blue Ready to wound bed as directed Secondary Dressing: Xtrasorb Medium 4x5 (in/in) 1 x Per Week/15 Days Discharge Instructions: Apply to wound as directed. Do not cut. Compression Wrap: Profore Lite LF 3 Multilayer Compression Bandaging System 1 x Per Week/15 Days Discharge Instructions: Apply 3 multi-layer wrap as prescribed. Compression Stockings: Jobst Farrow Wrap 4000 (DME) Right Leg Compression Amount: 30-40 mmHG Discharge Instructions: Apply to lower extremity as directed. Wound #3 - Lower Leg Wound Laterality: Left, Posterior Primary  Dressing: Hydrofera Blue Ready Transfer Foam, 2.5x2.5 (in/in) 1 x Per Week/15 Days Discharge Instructions: Apply Hydrofera Blue Ready to wound bed as directed Secondary Dressing: Xtrasorb Medium 4x5 (in/in) 1 x Per Week/15 Days Discharge Instructions: Apply to wound as directed. Do not cut. Compression Wrap: Profore Lite LF 3 Multilayer Compression Bandaging System 1 x Per Week/15 Days Discharge Instructions: Apply 3 multi-layer wrap as prescribed. Compression Stockings: Jobst Farrow Wrap 4000 (DME) Left Leg Compression Amount: 30-40 mmHG ZO, LOUDON B. ([id number]) Discharge Instructions: Apply to lower extremity as directed. Electronic Signature(s) Signed: 12/19/2020 1:17:06 PM By: Gretta Cool, BSN, RN, CWS, Kim RN, BSN Signed: 12/20/2020 8:16:57 AM By: Linton Ham MD Entered By: Gretta Cool, BSN, RN, CWS, Kim on 12/19/2020 10:21:05 [name] [name] ([id number]) -------------------------------------------------------------------------------- Problem List Details Patient Name: [name] [name], [name] [name] Date of Service: 12/19/2020 9:00 AM Medical Record Number: [id number] Patient Account Number: [id number] Date of Birth/Sex: [birth year] (87 y.o. F) Treating RN: Cornell Barman Primary Care Provider: Tracie Harrier Other Clinician: Jeanine Luz Referring Provider: Tracie Harrier Treating Provider/Extender: Tito Dine in Treatment: 2 Active Problems ICD-10 Encounter Code Description Active Date MDM Diagnosis I87.333 Chronic venous hypertension (idiopathic) with ulcer and inflammation of 12/05/2020 No Yes bilateral lower extremity L97.221 Non-pressure chronic ulcer of left calf limited to breakdown of skin 12/05/2020 No Yes L97.211 Non-pressure chronic ulcer of right calf limited to breakdown of skin 12/05/2020 No Yes Inactive Problems Resolved Problems Electronic Signature(s) Signed: 12/20/2020 8:16:57 AM By: Linton Ham MD Entered By: Linton Ham on 12/19/2020 10:05:12 [name] [name] ([id number]) -------------------------------------------------------------------------------- Progress Note Details Patient Name: [name] [name] Date of Service: 12/19/2020 9:00 AM Medical Record Number: [id number] Patient Account Number: [id number] Date of Birth/Sex: August 26, 1927 (87 y.o. F) Treating RN: Cornell Barman Primary Care Provider: Tracie Harrier Other Clinician: Jeanine Luz Referring Provider: Tracie Harrier Treating Provider/Extender: Tito Dine in Treatment: 2 Subjective History of Present Illness (HPI) ADMISSION 12/05/2020 This is a 87 year old woman who arrived accompanied by her daughter from [place]. She has been dealing with wounds on her bilateral posterior calf since January. There is no obvious  antecedent injury. Her daughter tells me that she is actually had wounds that have opened and closed on her legs since last summer. She currently she has 2 wounds on the right posterior calf and 1 on the left. They have been using Vaseline on the wounds and covering the area with kerlix but they have not been healing. The patient has been wearing what appears to be support stockings. Past medical history includes paroxysmal A. fib, pacemaker,, hypertension, PVD, COPD, left mastectomy chronic lower extremity swelling ABIs were noncompressible bilateral 3/23; 1 week follow-up. Bilateral venous insufficiency ulcers right greater than left. As opposed to what I was worried about last week she actually tolerated 3 layer compression. We used Hydrofera Blue 3/30; bilateral venous insufficiency ulcers right greater than left. The area on the left is healed this week still 2 areas on the posterior right calf that are both measuring smaller. We have been using Hydrofera Blue under 3 layer compression she is. She seems to be tolerating this Objective Constitutional Patient is hypertensive.. Pulse regular and within target range for patient.Marland Kitchen Respirations  regular, non-labored and within target range.. Temperature is normal and within the target range for the patient.Marland Kitchen appears in no distress. Vitals Time Taken: 8:54 AM, Height: 63 in, Weight: 174 lbs, BMI: 30.8, Temperature: 98.0 F, Pulse: 104 bpm, Respiratory Rate: 22 breaths/min, Blood Pressure: 154/74 mmHg. Cardiovascular Pedal pulses palpable and strong bilaterally.. Edema is well controlled bilaterally. General Notes: Wound exam; left posterior calf is closed. I used a #3 curette to remove some surface debris there is nothing open underneath this healthy looking new epithelialization On the right posterior 2 wounds remain. There is some slough on the surface although they are measurably smaller and I elected not to debride these today. Integumentary (Hair, Skin) Wound #1 status is Open. Original cause of wound was Gradually Appeared. The date acquired was: 09/30/2020. The wound has been in treatment 2 weeks. The wound is located on the Right,Proximal,Posterior Lower Leg. The wound measures 1.5cm length x 1cm width x 0.1cm depth; 1.178cm^2 area and 0.118cm^3 volume. There is Fat Layer (Subcutaneous Tissue) exposed. There is no tunneling or undermining noted. There is a medium amount of serosanguineous drainage noted. There is medium (34-66%) red granulation within the wound bed. There is a small (1-33%) amount of necrotic tissue within the wound bed including Adherent Slough. Wound #2 status is Open. Original cause of wound was Gradually Appeared. The date acquired was: 09/30/2020. The wound has been in treatment 2 weeks. The wound is located on the Right,Distal,Posterior Lower Leg. The wound measures 0.6cm length x 0.5cm width x 0.1cm depth; 0.236cm^2 area and 0.024cm^3 volume. There is Fat Layer (Subcutaneous Tissue) exposed. There is no tunneling or undermining noted. There is a medium amount of serosanguineous drainage noted. There is medium (34-66%) red granulation within the wound bed. There  is a small (1-33%) amount of necrotic tissue within the wound bed including Adherent Slough. Wound #3 status is Healed - Epithelialized. Original cause of wound was Gradually Appeared. The date acquired was: 09/30/2020. The wound has been in treatment 2 weeks. The wound is located on the Left,Posterior Lower Leg. The wound measures 0.1cm length x 0.1cm width x 0.1cm depth; 0cm^2 area and 0cm^3 volume. There is no tunneling or undermining noted. There is a none present amount of drainage noted. There is no granulation within the wound bed. There is a large (67-100%) amount of necrotic tissue within the wound bed including Eschar. [name] [name], [name] [name] ([id number])  Assessment Active Problems ICD-10 Chronic venous hypertension (idiopathic) with ulcer and inflammation of bilateral lower extremity Non-pressure chronic ulcer of left calf limited to breakdown of skin Non-pressure chronic ulcer of right calf limited to breakdown of skin Procedures Wound #1 Pre-procedure diagnosis of Wound #1 is a Venous Leg Ulcer located on the Right,Proximal,Posterior Lower Leg . There was a Three Layer Compression Therapy Procedure by Cornell Barman, RN. Post procedure Diagnosis Wound #1: Same as Pre-Procedure Notes: Patient is tolerating wraps well.. Plan Follow-up Appointments: Return Appointment in 1 week. Nurse Visit as needed Bathing/ Shower/ Hygiene: May shower with wound dressing protected with water repellent cover or cast protector. Edema Control - Lymphedema / Segmental Compressive Device / Other: Optional: One layer of unna paste to top of compression wrap (to act as an anchor). Elevate, Exercise Daily and Avoid Standing for Long Periods of Time. Elevate legs to the level of the heart and pump ankles as often as possible Elevate leg(s) parallel to the floor when sitting. Additional Orders / Instructions: Follow Nutritious Diet and Increase Protein Intake WOUND #1: - Lower Leg Wound Laterality: Right,  Posterior, Proximal Primary Dressing: Hydrofera Blue Ready Transfer Foam, 2.5x2.5 (in/in) Discharge Instructions: Apply Hydrofera Blue Ready to wound bed as directed Secondary Dressing: Xtrasorb Medium 4x5 (in/in) Discharge Instructions: Apply to wound as directed. Do not cut. Compression Wrap: Profore Lite LF 3 Multilayer Compression Bandaging System Discharge Instructions: Apply 3 multi-layer wrap as prescribed. WOUND #2: - Lower Leg Wound Laterality: Right, Posterior, Distal Primary Dressing: Hydrofera Blue Ready Transfer Foam, 2.5x2.5 (in/in) Discharge Instructions: Apply Hydrofera Blue Ready to wound bed as directed Secondary Dressing: Xtrasorb Medium 4x5 (in/in) Discharge Instructions: Apply to wound as directed. Do not cut. Compression Wrap: Profore Lite LF 3 Multilayer Compression Bandaging System Discharge Instructions: Apply 3 multi-layer wrap as prescribed. WOUND #3: - Lower Leg Wound Laterality: Left, Posterior Primary Dressing: Hydrofera Blue Ready Transfer Foam, 2.5x2.5 (in/in) Discharge Instructions: Apply Hydrofera Blue Ready to wound bed as directed Secondary Dressing: Xtrasorb Medium 4x5 (in/in) Discharge Instructions: Apply to wound as directed. Do not cut. Compression Wrap: Profore Lite LF 3 Multilayer Compression Bandaging System Discharge Instructions: Apply 3 multi-layer wrap as prescribed. 1. Continue with Hydrofera Blue Xtrasorb under 3 layer compression 2. Careful attention to dimensions. Both of the areas on the right looks smaller and measure smaller. No further debridement might be necessary. 3. We transitioned her left leg into her compression stockings although they might elect to order juxta lite stockings for ease of application Electronic Signature(s) Signed: 12/20/2020 8:16:57 AM By: Linton Ham MD [name] [name] ([id number]) Entered By: Linton Ham on 12/19/2020 10:08:23 [name] [name]  ([id number]) -------------------------------------------------------------------------------- SuperBill Details Patient Name: [name] [name] Date of Service: 12/19/2020 Medical Record Number: [id number] Patient Account Number: [id number] Date of Birth/Sex: Jan 24, 1927 (87 y.o. F) Treating RN: Cornell Barman Primary Care Provider: Tracie Harrier Other Clinician: Jeanine Luz Referring Provider: Tracie Harrier Treating Provider/Extender: Tito Dine in Treatment: 2 Diagnosis Coding ICD-10 Codes Code Description [phone] Chronic venous hypertension (idiopathic) with ulcer and inflammation of bilateral lower extremity L97.221 Non-pressure chronic ulcer of left calf limited to breakdown of skin L97.211 Non-pressure chronic ulcer of right calf limited to breakdown of skin Facility Procedures CPT4 Code: [id number] Description: (Facility Use Only) 29581LT - APPLY MULTLAY COMPRS LWR LT LEG Modifier: Quantity: 1 Physician Procedures CPT4 Code Description: [id number] - WC PHYS LEVEL 3 - EST PT Modifier: Quantity: 1 CPT4 Code Description: ICD-10 Diagnosis Description L97.221 Non-pressure chronic  ulcer of left calf limited to breakdown of skin L97.211 Non-pressure chronic ulcer of right calf limited to breakdown of skin I87.333 Chronic venous hypertension  (idiopathic) with ulcer and inflammation of Modifier: bilateral lower extre Quantity: Financial trader) Signed: 12/20/2020 7:49:32 AM By: Gretta Cool, BSN, RN, CWS, Kim RN, BSN Signed: 12/20/2020 8:16:57 AM By: Linton Ham MD Entered By: Gretta Cool, BSN, RN, CWS, Kim on 12/20/2020 07:49:31

## 2020-12-20 NOTE — Progress Notes (Signed)
BANESSA, MAO ([id number]) Visit Report for 12/19/2020 Arrival Information Details Patient Name: [name] [name], [name] [name]. Date of Service: 12/19/2020 9:00 AM Medical Record Number: [id number] Patient Account Number: [id number] Date of Birth/Sex: 09/05/1927 (87 y.o. F) Treating RN: Cornell Barman Primary Care Zalen Sequeira: Tracie Harrier Other Clinician: Jeanine Luz Referring Charls Custer: Tracie Harrier Treating Gaylen Venning/Extender: Tito Dine in Treatment: 2 Visit Information History Since Last Visit Added or deleted any medications: No Patient Arrived: Ambulatory Had a fall or experienced change in No Arrival Time: 08:53 activities of daily living that may affect Accompanied By: daughter risk of falls: Transfer Assistance: None Hospitalized since last visit: No Patient Identification Verified: Yes Pain Present Now: Yes Secondary Verification Process Completed: Yes Patient Requires Transmission-Based No Precautions: Patient Has Alerts: Yes Patient Alerts: Patient on Blood Thinner warfarin NON COMPRESSIBLE L and R NOT DIABETIC Electronic Signature(s) Signed: 12/19/2020 4:51:14 PM By: Jeanine Luz Entered By: Jeanine Luz on 12/19/2020 08:55:51 [name] [name] ([id number]) -------------------------------------------------------------------------------- Compression Therapy Details Patient Name: [name] [name] Date of Service: 12/19/2020 9:00 AM Medical Record Number: [id number] Patient Account Number: [id number] Date of Birth/Sex: 02-01-1927 (87 y.o. F) Treating RN: Cornell Barman Primary Care Benna Arno: Tracie Harrier Other Clinician: Jeanine Luz Referring Kortlyn Koltz: Tracie Harrier Treating Yari Szeliga/Extender: Tito Dine in Treatment: 2 Compression Therapy Performed for Wound Assessment: Wound #1 Right,Proximal,Posterior Lower Leg Performed By: Clinician Cornell Barman, RN Compression Type: Three Layer Post Procedure Diagnosis Same as  Pre-procedure Notes Patient is tolerating wraps well. Electronic Signature(s) Signed: 12/19/2020 1:17:06 PM By: Gretta Cool, BSN, RN, CWS, Kim RN, BSN Entered By: Gretta Cool, BSN, RN, CWS, Kim on 12/19/2020 09:53:47 [name] [name] ([id number]) -------------------------------------------------------------------------------- Encounter Discharge Information Details Patient Name: [name] [name], [name] [name]. Date of Service: 12/19/2020 9:00 AM Medical Record Number: [id number] Patient Account Number: [id number] Date of Birth/Sex: 07-23-1927 (87 y.o. F) Treating RN: Cornell Barman Primary Care Tanis Hensarling: Tracie Harrier Other Clinician: Jeanine Luz Referring Brek Reece: Tracie Harrier Treating Anny Sayler/Extender: Tito Dine in Treatment: 2 Encounter Discharge Information Items Discharge Condition: Stable Schedule Follow-up Appointment: No Clinical Summary of Care: Electronic Signature(s) Signed: 12/20/2020 7:50:47 AM By: Gretta Cool, BSN, RN, CWS, Kim RN, BSN Previous Signature: 12/20/2020 7:50:32 AM Version By: Gretta Cool, BSN, RN, CWS, Kim RN, BSN Entered By: Gretta Cool, BSN, RN, CWS, Kim on 12/20/2020 07:50:46 [name] [name] ([id number]) -------------------------------------------------------------------------------- Lower Extremity Assessment Details Patient Name: [name] [name], [name] [name]. Date of Service: 12/19/2020 9:00 AM Medical Record Number: [id number] Patient Account Number: [id number] Date of Birth/Sex: June 06, 1927 (87 y.o. F) Treating RN: Cornell Barman Primary Care Alyscia Carmon: Tracie Harrier Other Clinician: Jeanine Luz Referring Dameir Gentzler: Tracie Harrier Treating Harlen Danford/Extender: Tito Dine in Treatment: 2 Edema Assessment Assessed: [Left: No] [Right: No] [Left: Edema] [Right: :] Calf Left: Right: Point of Measurement: 31 cm From Medial Instep 33 cm 37 cm Ankle Left: Right: Point of Measurement: 11 cm From Medial Instep 19 cm 19.5 cm Knee To Floor Left: Right: From Medial Instep 37  cm Vascular Assessment Pulses: Dorsalis Pedis Palpable: [Left:Yes] [Right:Yes] Notes 37cm ankle to knee for [employer]) Signed: 12/19/2020 10:11:08 AM By: Gretta Cool, BSN, RN, CWS, Kim RN, BSN Entered By: Gretta Cool, BSN, RN, CWS, Kim on 12/19/2020 10:11:08 [name] [name] ([id number]) -------------------------------------------------------------------------------- Multi Wound Chart Details Patient Name: [name] [name] Date of Service: 12/19/2020 9:00 AM Medical Record Number: [id number] Patient Account Number: [id number] Date of Birth/Sex: 01-26-1927 (87 y.o. F) Treating RN: Cornell Barman Primary Care Adylee Leonardo: Tracie Harrier Other Clinician: Jeanine Luz Referring Destina Mantei: Tracie Harrier Treating Arlis Everly/Extender:  ROBSON, MICHAEL G Weeks in Treatment: 2 Vital Signs Height(in): 5 Pulse(bpm): 104 Weight(lbs): 174 Blood Pressure(mmHg): 154/74 Body Mass Index(BMI): 31 Temperature(F): 98.0 Respiratory Rate(breaths/min): 22 Photos: Wound Location: Right, Proximal, Posterior Lower Right, Distal, Posterior Lower Leg Left, Posterior Lower Leg Leg Wounding Event: Gradually Appeared Gradually Appeared Gradually Appeared Primary Etiology: Venous Leg Ulcer Venous Leg Ulcer Venous Leg Ulcer Comorbid History: Chronic Obstructive Pulmonary Chronic Obstructive Pulmonary Chronic Obstructive Pulmonary Disease (COPD), Arrhythmia, Disease (COPD), Arrhythmia, Disease (COPD), Arrhythmia, Congestive Heart Failure, Congestive Heart Failure, Congestive Heart Failure, Hypertension, Received Hypertension, Received Hypertension, Received Chemotherapy Chemotherapy Chemotherapy Date Acquired: 09/30/2020 09/30/2020 09/30/2020 Weeks of Treatment: 2 2 2  Wound Status: Open Open Healed - Epithelialized Measurements L x W x D (cm) 1.5x1x0.1 0.6x0.5x0.1 0.1x0.1x0.1 Area (cm) : 1.178 0.236 0 Volume (cm) : 0.118 0.024 0 % Reduction in Area: 46.40% 57.10% 100.00% % Reduction in Volume: 46.40%  56.40% 100.00% Classification: Full Thickness Without Exposed Full Thickness Without Exposed Full Thickness Without Exposed Support Structures Support Structures Support Structures Exudate Amount: Medium Medium None Present Exudate Type: Serosanguineous Serosanguineous N/A Exudate Color: red, brown red, brown N/A Granulation Amount: Medium (34-66%) Medium (34-66%) None Present (0%) Granulation Quality: Red Red N/A Necrotic Amount: Small (1-33%) Small (1-33%) Large (67-100%) Necrotic Tissue: Adherent [place] Eschar Exposed Structures: Fat Layer (Subcutaneous Tissue): Fat Layer (Subcutaneous Tissue): Fascia: No Yes Yes Fat Layer (Subcutaneous Tissue): Fascia: No Fascia: No No Tendon: No Tendon: No Tendon: No Muscle: No Muscle: No Muscle: No Joint: No Joint: No Joint: No Bone: No Bone: No Bone: No Epithelialization: N/A N/A None Procedures Performed: Compression Therapy N/A N/A Treatment Notes Electronic Signature(s) Signed: 12/20/2020 8:16:57 AM By: Linton Ham MD [name] [name] ([id number]) Entered By: Linton Ham on 12/19/2020 10:05:19 [name] [name] ([id number]) -------------------------------------------------------------------------------- [place] Details Patient Name: [name] [name], [name] [name]. Date of Service: 12/19/2020 9:00 AM Medical Record Number: [id number] Patient Account Number: [id number] Date of Birth/Sex: 02-Nov-1926 (87 y.o. F) Treating RN: Cornell Barman Primary Care Shanetha Bradham: Tracie Harrier Other Clinician: Jeanine Luz Referring Faizon Capozzi: Tracie Harrier Treating Ardith Lewman/Extender: Tito Dine in Treatment: 2 Active Inactive Necrotic Tissue Nursing Diagnoses: Impaired tissue integrity related to necrotic/devitalized tissue Goals: Necrotic/devitalized tissue will be minimized in the wound bed Date Initiated: 12/05/2020 Target Resolution Date: 12/19/2020 Goal Status: Active Interventions: Assess  patient pain level pre-, during and post procedure and prior to discharge Treatment Activities: Apply topical anesthetic as ordered : 12/05/2020 Notes: Orientation to the Wound Care Program Nursing Diagnoses: Knowledge deficit related to the wound healing center program Goals: Patient/caregiver will verbalize understanding of the [place] Date Initiated: 12/05/2020 Target Resolution Date: 12/19/2020 Goal Status: Active Interventions: Provide education on orientation to the wound center Notes: Venous Leg Ulcer Nursing Diagnoses: Actual venous Insuffiency (use after diagnosis is confirmed) Goals: Patient will maintain optimal edema control Date Initiated: 12/05/2020 Target Resolution Date: 12/19/2020 Goal Status: Active Interventions: Assess peripheral edema status every visit. Compression as ordered Provide education on venous insufficiency Treatment Activities: Therapeutic compression applied : 12/05/2020 Notes: Wound/Skin Impairment TYLIA, EWELL ([id number]) Nursing Diagnoses: Impaired tissue integrity Goals: Patient/caregiver will verbalize understanding of skin care regimen Date Initiated: 12/05/2020 Target Resolution Date: 12/05/2020 Goal Status: Active Ulcer/skin breakdown will have a volume reduction of 30% by week 4 Date Initiated: 12/05/2020 Target Resolution Date: 01/05/2021 Goal Status: Active Interventions: Assess patient/caregiver ability to perform ulcer/skin care regimen upon admission and as needed Assess ulceration(s) every visit Treatment Activities: Skin care regimen initiated : 12/05/2020  Topical wound management initiated : 12/05/2020 Notes: Electronic Signature(s) Signed: 12/19/2020 10:04:24 AM By: Gretta Cool, BSN, RN, CWS, Kim RN, BSN Entered By: Gretta Cool, BSN, RN, CWS, Kim on 12/19/2020 10:04:24 [name] [name] ([id number]) -------------------------------------------------------------------------------- Pain Assessment Details Patient  Name: [name] [name], [name] [name]. Date of Service: 12/19/2020 9:00 AM Medical Record Number: [id number] Patient Account Number: [id number] Date of Birth/Sex: September 27, 1926 (87 y.o. F) Treating RN: Cornell Barman Primary Care Amias Hutchinson: Tracie Harrier Other Clinician: Jeanine Luz Referring Gilmar Bua: Tracie Harrier Treating Indira Sorenson/Extender: Tito Dine in Treatment: 2 Active Problems Location of Pain Severity and Description of Pain Patient Has Paino Yes Site Locations Rate the pain. Current Pain Level: 5 Pain Management and Medication Current Pain Management: Electronic Signature(s) Signed: 12/19/2020 1:17:06 PM By: Gretta Cool, BSN, RN, CWS, Kim RN, BSN Signed: 12/19/2020 4:51:14 PM By: Jeanine Luz Entered By: Jeanine Luz on 12/19/2020 [place], [place] B. ([id number]) -------------------------------------------------------------------------------- Patient/Caregiver Education Details Patient Name: [name] [name] Date of Service: 12/19/2020 9:00 AM Medical Record Number: [id number] Patient Account Number: [id number] Date of Birth/Gender: 01-Jun-1927 (87 y.o. F) Treating RN: Cornell Barman Primary Care Physician: Tracie Harrier Other Clinician: Jeanine Luz Referring Physician: Tracie Harrier Treating Physician/Extender: Tito Dine in Treatment: 2 Education Assessment Education Provided To: Patient Education Topics Provided Venous: Handouts: Controlling Swelling with Compression Stockings , Controlling Swelling with Multilayered Compression Wraps Methods: Demonstration, Explain/Verbal Responses: State content correctly Electronic Signature(s) Signed: 12/20/2020 9:44:32 AM By: Gretta Cool, BSN, RN, CWS, Kim RN, BSN Entered By: Gretta Cool, BSN, RN, CWS, Kim on 12/20/2020 07:49:51 [name] [name] ([id number]) -------------------------------------------------------------------------------- Wound Assessment Details Patient Name: [name] [name], [name] [name] Date of Service:  12/19/2020 9:00 AM Medical Record Number: [id number] Patient Account Number: [id number] Date of Birth/Sex: 06-Dec-1926 (87 y.o. F) Treating RN: Cornell Barman Primary Care Kharter Sestak: Tracie Harrier Other Clinician: Jeanine Luz Referring Doloris Servantes: Tracie Harrier Treating Johnwilliam Shepperson/Extender: Tito Dine in Treatment: 2 Wound Status Wound Number: 1 Primary Venous Leg Ulcer Etiology: Wound Location: Right, Proximal, Posterior Lower Leg Wound Open Wounding Event: Gradually Appeared Status: Date Acquired: 09/30/2020 Comorbid Chronic Obstructive Pulmonary Disease (COPD), Weeks Of Treatment: 2 History: Arrhythmia, Congestive Heart Failure, Hypertension, Clustered Wound: No Received Chemotherapy Photos Wound Measurements Length: (cm) 1.5 Width: (cm) 1 Depth: (cm) 0.1 Area: (cm) 1.178 Volume: (cm) 0.118 % Reduction in Area: 46.4% % Reduction in Volume: 46.4% Tunneling: No Undermining: No Wound Description Classification: Full Thickness Without Exposed Support Structures Exudate Amount: Medium Exudate Type: Serosanguineous Exudate Color: red, brown Foul Odor After Cleansing: No Slough/Fibrino Yes Wound Bed Granulation Amount: Medium (34-66%) Exposed Structure Granulation Quality: Red Fascia Exposed: No Necrotic Amount: Small (1-33%) Fat Layer (Subcutaneous Tissue) Exposed: Yes Necrotic Quality: Adherent Slough Tendon Exposed: No Muscle Exposed: No Joint Exposed: No Bone Exposed: No Treatment Notes Wound #1 (Lower Leg) Wound Laterality: Right, Posterior, Proximal Cleanser Peri-Wound Care Topical Primary Dressing JOYCE, HEITMAN ([id number]) Hydrofera Blue Ready Transfer Foam, 2.5x2.5 (in/in) Discharge Instruction: Apply Hydrofera Blue Ready to wound bed as directed Secondary Dressing Xtrasorb Medium 4x5 (in/in) Discharge Instruction: Apply to wound as directed. Do not cut. Secured With Compression Wrap Profore Lite LF 3 Multilayer Compression Bandaging  System Discharge Instruction: Apply 3 multi-layer wrap as prescribed. Compression Stockings Add-Ons Electronic Signature(s) Signed: 12/19/2020 1:17:06 PM By: Gretta Cool, BSN, RN, CWS, Kim RN, BSN Signed: 12/19/2020 4:51:14 PM By: Jeanine Luz Entered By: Jeanine Luz on 12/19/2020 09:17:58 [name] [name] ([id number]) -------------------------------------------------------------------------------- Wound Assessment Details Patient Name: [name] [name], [name] B. Date of Service: 12/19/2020 9:00 AM Medical Record Number:  [id number] Patient Account Number: [id number] Date of Birth/Sex: [birth year] (87 y.o. F) Treating RN: Cornell Barman Primary Care Desirre Eickhoff: Tracie Harrier Other Clinician: Jeanine Luz Referring Zoii Florer: Tracie Harrier Treating Dovber Ernest/Extender: Tito Dine in Treatment: 2 Wound Status Wound Number: 2 Primary Venous Leg Ulcer Etiology: Wound Location: Right, Distal, Posterior Lower Leg Wound Open Wounding Event: Gradually Appeared Status: Date Acquired: 09/30/2020 Comorbid Chronic Obstructive Pulmonary Disease (COPD), Weeks Of Treatment: 2 History: Arrhythmia, Congestive Heart Failure, Hypertension, Clustered Wound: No Received Chemotherapy Photos Wound Measurements Length: (cm) 0.6 Width: (cm) 0.5 Depth: (cm) 0.1 Area: (cm) 0.236 Volume: (cm) 0.024 % Reduction in Area: 57.1% % Reduction in Volume: 56.4% Tunneling: No Undermining: No Wound Description Classification: Full Thickness Without Exposed Support Structures Exudate Amount: Medium Exudate Type: Serosanguineous Exudate Color: red, brown Foul Odor After Cleansing: No Slough/Fibrino Yes Wound Bed Granulation Amount: Medium (34-66%) Exposed Structure Granulation Quality: Red Fascia Exposed: No Necrotic Amount: Small (1-33%) Fat Layer (Subcutaneous Tissue) Exposed: Yes Necrotic Quality: Adherent Slough Tendon Exposed: No Muscle Exposed: No Joint Exposed: No Bone Exposed:  No Treatment Notes Wound #2 (Lower Leg) Wound Laterality: Right, Posterior, Distal Cleanser Peri-Wound Care Topical Primary Dressing EVER, HALBERG ([id number]) Hydrofera Blue Ready Transfer Foam, 2.5x2.5 (in/in) Discharge Instruction: Apply Hydrofera Blue Ready to wound bed as directed Secondary Dressing Xtrasorb Medium 4x5 (in/in) Discharge Instruction: Apply to wound as directed. Do not cut. Secured With Compression Wrap Profore Lite LF 3 Multilayer Compression Bandaging System Discharge Instruction: Apply 3 multi-layer wrap as prescribed. Compression Stockings Jobst Farrow Wrap 4000 Quantity: 1 Right Leg Compression Amount: 30-40 mmHg Discharge Instruction: Apply to lower extremity as directed. Add-Ons Electronic Signature(s) Signed: 12/19/2020 1:17:06 PM By: Gretta Cool, BSN, RN, CWS, Kim RN, BSN Signed: 12/19/2020 4:51:14 PM By: Jeanine Luz Entered By: Jeanine Luz on 12/19/2020 09:18:35 [name] [name] ([id number]) -------------------------------------------------------------------------------- Wound Assessment Details Patient Name: [name] [name], [name] [name]. Date of Service: 12/19/2020 9:00 AM Medical Record Number: [id number] Patient Account Number: [id number] Date of Birth/Sex: 1927/04/29 (87 y.o. F) Treating RN: Cornell Barman Primary Care Sher Hellinger: Tracie Harrier Other Clinician: Jeanine Luz Referring Belkis Norbeck: Tracie Harrier Treating Brogan England/Extender: Tito Dine in Treatment: 2 Wound Status Wound Number: 3 Primary Venous Leg Ulcer Etiology: Wound Location: Left, Posterior Lower Leg Wound Healed - Epithelialized Wounding Event: Gradually Appeared Status: Date Acquired: 09/30/2020 Comorbid Chronic Obstructive Pulmonary Disease (COPD), Weeks Of Treatment: 2 History: Arrhythmia, Congestive Heart Failure, Hypertension, Clustered Wound: No Received Chemotherapy Photos Wound Measurements Length: (cm) 0.1 Width: (cm) 0.1 Depth: (cm) 0.1 Area: (cm)  0 Volume: (cm) 0 % Reduction in Area: 100% % Reduction in Volume: 100% Epithelialization: None Tunneling: No Undermining: No Wound Description Classification: Full Thickness Without Exposed Support Structures Exudate Amount: None Present Foul Odor After Cleansing: No Slough/Fibrino No Wound Bed Granulation Amount: None Present (0%) Exposed Structure Necrotic Amount: Large (67-100%) Fascia Exposed: No Necrotic Quality: Eschar Fat Layer (Subcutaneous Tissue) Exposed: No Tendon Exposed: No Muscle Exposed: No Joint Exposed: No Bone Exposed: No Electronic Signature(s) Signed: 12/19/2020 1:17:06 PM By: Gretta Cool, BSN, RN, CWS, Kim RN, BSN Entered By: Gretta Cool, BSN, RN, CWS, Kim on 12/19/2020 09:54:38 [name] [name] ([id number]) -------------------------------------------------------------------------------- Vitals Details Patient Name: [name] [name] Date of Service: 12/19/2020 9:00 AM Medical Record Number: [id number] Patient Account Number: [id number] Date of Birth/Sex: Feb 15, 1927 (88 y.o. F) Treating RN: Cornell Barman Primary Care Dimetrius Montfort: Tracie Harrier Other Clinician: Jeanine Luz Referring Colleen Donahoe: Tracie Harrier Treating Joao Mccurdy/Extender: Ricard Dillon Weeks in Treatment: 2 Vital Signs Time Taken: 08:54  Temperature (F): 98.0 Height (in): 63 Pulse (bpm): 104 Weight (lbs): 174 Respiratory Rate (breaths/min): 22 Body Mass Index (BMI): 30.8 Blood Pressure (mmHg): 154/74 Reference Range: 80 - 120 mg / dl Electronic Signature(s) Signed: 12/19/2020 4:51:14 PM By: Jeanine Luz Entered By: Jeanine Luz on 12/19/2020 08:59:41

## 2020-12-21 DIAGNOSIS — S81809A Unspecified open wound, unspecified lower leg, initial encounter: Secondary | ICD-10-CM | POA: Diagnosis not present

## 2020-12-24 ENCOUNTER — Encounter: Payer: PPO | Attending: Physician Assistant

## 2020-12-24 ENCOUNTER — Other Ambulatory Visit: Payer: Self-pay

## 2020-12-24 DIAGNOSIS — I480 Paroxysmal atrial fibrillation: Secondary | ICD-10-CM | POA: Insufficient documentation

## 2020-12-24 DIAGNOSIS — L97211 Non-pressure chronic ulcer of right calf limited to breakdown of skin: Secondary | ICD-10-CM | POA: Diagnosis not present

## 2020-12-24 DIAGNOSIS — I87333 Chronic venous hypertension (idiopathic) with ulcer and inflammation of bilateral lower extremity: Secondary | ICD-10-CM | POA: Diagnosis not present

## 2020-12-24 DIAGNOSIS — I509 Heart failure, unspecified: Secondary | ICD-10-CM | POA: Diagnosis not present

## 2020-12-24 DIAGNOSIS — I110 Hypertensive heart disease with heart failure: Secondary | ICD-10-CM | POA: Diagnosis not present

## 2020-12-24 DIAGNOSIS — L97221 Non-pressure chronic ulcer of left calf limited to breakdown of skin: Secondary | ICD-10-CM | POA: Insufficient documentation

## 2020-12-25 DIAGNOSIS — I480 Paroxysmal atrial fibrillation: Secondary | ICD-10-CM | POA: Diagnosis not present

## 2020-12-25 NOTE — Progress Notes (Signed)
RIKA, DAUGHDRILL ([id number]) Visit Report for 12/24/2020 Arrival Information Details Patient Name: [name] [name], [name] [name]. Date of Service: 12/24/2020 3:15 PM Medical Record Number: [id number] Patient Account Number: [id number] Date of Birth/Sex: 1927/07/08 (87 y.o. F) Treating RN: Donnamarie Poag Primary Care Mackenzye Mackel: Tracie Harrier Other Clinician: Jeanine Luz Referring Oneida Mckamey: Tracie Harrier Treating Emerlyn Mehlhoff/Extender: Skipper Cliche in Treatment: 2 Visit Information History Since Last Visit Added or deleted any medications: No Patient Arrived: Cane Any new allergies or adverse reactions: No Arrival Time: 15:18 Hospitalized since last visit: No Accompanied By: daughter Has Dressing in Place as Prescribed: No Transfer Assistance: None Has Compression in Place as Prescribed: No Patient Identification Verified: Yes Pain Present Now: No Secondary Verification Process Completed: Yes Patient Requires Transmission-Based No Precautions: Patient Has Alerts: Yes Patient Alerts: Patient on Blood Thinner warfarin NON COMPRESSIBLE L and R NOT DIABETIC Electronic Signature(s) Signed: 12/25/2020 10:57:38 AM By: Donnamarie Poag Entered By: Donnamarie Poag on 12/24/2020 15:20:22 [name] [name] ([id number]) -------------------------------------------------------------------------------- Clinic Level of Care Assessment Details Patient Name: [name] [name] Date of Service: 12/24/2020 3:15 PM Medical Record Number: [id number] Patient Account Number: [id number] Date of Birth/Sex: Jan 07, 1927 (87 y.o. F) Treating RN: Donnamarie Poag Primary Care Ariah Mower: Tracie Harrier Other Clinician: Jeanine Luz Referring Alessandra Sawdey: Tracie Harrier Treating Elianne Gubser/Extender: Skipper Cliche in Treatment: 2 Clinic Level of Care Assessment Items TOOL 1 Quantity Score []  - Use when EandM and Procedure is performed on INITIAL visit 0 ASSESSMENTS - Nursing Assessment / Reassessment []  - General Physical Exam  (combine w/ comprehensive assessment (listed just below) when performed on new 0 pt. evals) []  - 0 Comprehensive Assessment (HX, ROS, Risk Assessments, Wounds Hx, etc.) ASSESSMENTS - Wound and Skin Assessment / Reassessment []  - Dermatologic / Skin Assessment (not related to wound area) 0 ASSESSMENTS - Ostomy and/or Continence Assessment and Care []  - Incontinence Assessment and Management 0 []  - 0 Ostomy Care Assessment and Management (repouching, etc.) PROCESS - Coordination of Care []  - Simple Patient / Family Education for ongoing care 0 []  - 0 Complex (extensive) Patient / Family Education for ongoing care []  - 0 Staff obtains Programmer, systems, Records, Test Results / Process Orders []  - 0 Staff telephones HHA, Nursing Homes / Clarify orders / etc []  - 0 Routine Transfer to another Facility (non-emergent condition) []  - 0 Routine Hospital Admission (non-emergent condition) []  - 0 New Admissions / Biomedical engineer / Ordering NPWT, Apligraf, etc. []  - 0 Emergency Hospital Admission (emergent condition) PROCESS - Special Needs []  - Pediatric / Minor Patient Management 0 []  - 0 Isolation Patient Management []  - 0 Hearing / Language / Visual special needs []  - 0 Assessment of Community assistance (transportation, D/C planning, etc.) []  - 0 Additional assistance / Altered mentation []  - 0 Support Surface(s) Assessment (bed, cushion, seat, etc.) INTERVENTIONS - Miscellaneous []  - External ear exam 0 []  - 0 Patient Transfer (multiple staff / Civil Service fast streamer / Similar devices) []  - 0 Simple Staple / Suture removal (25 or less) []  - 0 Complex Staple / Suture removal (26 or more) []  - 0 Hypo/Hyperglycemic Management (do not check if billed separately) []  - 0 Ankle / Brachial Index (ABI) - do not check if billed separately Has the patient been seen at the hospital within the last three years: Yes Total Score: 0 Level Of Care: ____ [name] [name] ([id number]) Electronic  Signature(s) Signed: 12/25/2020 10:57:38 AM By: Donnamarie Poag Entered By: Donnamarie Poag on 12/24/2020 15:49:19 [name] [name] ([id number]) -------------------------------------------------------------------------------- Compression Therapy  Details Patient Name: [name] [name]. Date of Service: 12/24/2020 3:15 PM Medical Record Number: [id number] Patient Account Number: [id number] Date of Birth/Sex: [birth year] (87 y.o. F) Treating RN: Donnamarie Poag Primary Care Zamara Cozad: Tracie Harrier Other Clinician: Jeanine Luz Referring Tiasia Weberg: Tracie Harrier Treating Aerabella Galasso/Extender: Skipper Cliche in Treatment: 2 Compression Therapy Performed for Wound Assessment: Wound #1 Right,Proximal,Posterior Lower Leg Performed By: Clinician Donnamarie Poag, RN Compression Type: Three Layer Notes non compressible RandL Electronic Signature(s) Signed: 12/25/2020 10:57:38 AM By: Donnamarie Poag Entered By: Donnamarie Poag on 12/24/2020 15:47:00 [name] [name] ([id number]) -------------------------------------------------------------------------------- Encounter Discharge Information Details Patient Name: [name] [name] Date of Service: 12/24/2020 3:15 PM Medical Record Number: [id number] Patient Account Number: [id number] Date of Birth/Sex: June 01, 1927 (87 y.o. F) Treating RN: Donnamarie Poag Primary Care Hawkins Seaman: Tracie Harrier Other Clinician: Jeanine Luz Referring Kaspian Muccio: Tracie Harrier Treating Tyron Manetta/Extender: Skipper Cliche in Treatment: 2 Encounter Discharge Information Items Discharge Condition: Stable Ambulatory Status: Ambulatory Discharge Destination: Home Transportation: Private Auto Accompanied By: daughter Schedule Follow-up Appointment: Yes Clinical Summary of Care: Electronic Signature(s) Signed: 12/25/2020 10:57:38 AM By: Donnamarie Poag Entered By: Donnamarie Poag on 12/24/2020 15:47:54 [name] [name]  ([id number]) -------------------------------------------------------------------------------- Wound Assessment Details Patient Name: [name] [name] Date of Service: 12/24/2020 3:15 PM Medical Record Number: [id number] Patient Account Number: [id number] Date of Birth/Sex: 1926/11/08 (87 y.o. F) Treating RN: Donnamarie Poag Primary Care Marquinn Meschke: Tracie Harrier Other Clinician: Jeanine Luz Referring Shaarav Ripple: Tracie Harrier Treating Tramayne Sebesta/Extender: Skipper Cliche in Treatment: 2 Wound Status Wound Number: 1 Primary Venous Leg Ulcer Etiology: Wound Location: Right, Proximal, Posterior Lower Leg Wound Open Wounding Event: Gradually Appeared Status: Date Acquired: 09/30/2020 Comorbid Chronic Obstructive Pulmonary Disease (COPD), Weeks Of Treatment: 2 History: Arrhythmia, Congestive Heart Failure, Hypertension, Clustered Wound: No Received Chemotherapy Wound Measurements Length: (cm) 1.5 Width: (cm) 1 Depth: (cm) 0.1 Area: (cm) 1.178 Volume: (cm) 0.118 % Reduction in Area: 46.4% % Reduction in Volume: 46.4% Wound Description Classification: Full Thickness Without Exposed Support Structu Exudate Amount: Medium Exudate Type: Serosanguineous Exudate Color: red, brown res Foul Odor After Cleansing: No Slough/Fibrino Yes Wound Bed Granulation Amount: Medium (34-66%) Exposed Structure Granulation Quality: Red Fascia Exposed: No Necrotic Amount: Small (1-33%) Fat Layer (Subcutaneous Tissue) Exposed: Yes Necrotic Quality: Adherent Slough Tendon Exposed: No Muscle Exposed: No Joint Exposed: No Bone Exposed: No Treatment Notes Wound #1 (Lower Leg) Wound Laterality: Right, Posterior, Proximal Cleanser Peri-Wound Care Topical Primary Dressing Hydrofera Blue Ready Transfer Foam, 2.5x2.5 (in/in) Discharge Instruction: Apply Hydrofera Blue Ready to wound bed as directed Secondary Dressing Xtrasorb Medium 4x5 (in/in) Discharge Instruction: Apply to wound as directed.  Do not cut. Secured With Compression Wrap Profore Lite LF 3 Multilayer Compression Bandaging System Discharge Instruction: Apply 3 multi-layer wrap as prescribed. Compression Stockings AVIELLA, DISBROW ([id number]) Add-Ons Electronic Signature(s) Signed: 12/25/2020 10:57:38 AM By: Donnamarie Poag Entered By: Donnamarie Poag on 12/24/2020 15:20:55 [name] [name] ([id number]) -------------------------------------------------------------------------------- Wound Assessment Details Patient Name: [name] [name] Date of Service: 12/24/2020 3:15 PM Medical Record Number: [id number] Patient Account Number: [id number] Date of Birth/Sex: 1927-07-26 (87 y.o. F) Treating RN: Donnamarie Poag Primary Care Adessa Primiano: Tracie Harrier Other Clinician: Jeanine Luz Referring Chala Gul: Tracie Harrier Treating Keziah Avis/Extender: Skipper Cliche in Treatment: 2 Wound Status Wound Number: 2 Primary Venous Leg Ulcer Etiology: Wound Location: Right, Distal, Posterior Lower Leg Wound Open Wounding Event: Gradually Appeared Status: Date Acquired: 09/30/2020 Comorbid Chronic Obstructive Pulmonary Disease (COPD), Weeks Of Treatment: 2 History: Arrhythmia, Congestive Heart Failure, Hypertension, Clustered Wound: No Received Chemotherapy  Wound Measurements Length: (cm) 0.6 Width: (cm) 0.5 Depth: (cm) 0.1 Area: (cm) 0.236 Volume: (cm) 0.024 % Reduction in Area: 57.1% % Reduction in Volume: 56.4% Wound Description Classification: Full Thickness Without Exposed Support Structu Exudate Amount: Medium Exudate Type: Serosanguineous Exudate Color: red, brown res Foul Odor After Cleansing: No Slough/Fibrino Yes Wound Bed Granulation Amount: Medium (34-66%) Exposed Structure Granulation Quality: Red Fascia Exposed: No Necrotic Amount: Small (1-33%) Fat Layer (Subcutaneous Tissue) Exposed: Yes Necrotic Quality: Adherent Slough Tendon Exposed: No Muscle Exposed: No Joint Exposed: No Bone Exposed:  No Treatment Notes Wound #2 (Lower Leg) Wound Laterality: Right, Posterior, Distal Cleanser Peri-Wound Care Topical Primary Dressing Hydrofera Blue Ready Transfer Foam, 2.5x2.5 (in/in) Discharge Instruction: Apply Hydrofera Blue Ready to wound bed as directed Secondary Dressing Xtrasorb Medium 4x5 (in/in) Discharge Instruction: Apply to wound as directed. Do not cut. Secured With Compression Wrap Profore Lite LF 3 Multilayer Compression Bandaging System Discharge Instruction: Apply 3 multi-layer wrap as prescribed. Compression Stockings GRADIE, BUTRICK ([id number]) [place] Che Wrap 4000 Quantity: 1 Right Leg Compression Amount: 30-40 mmHg Discharge Instruction: Apply to lower extremity as directed. Add-Ons Electronic Signature(s) Signed: 12/25/2020 10:57:38 AM By: Donnamarie Poag Entered By: Donnamarie Poag on 12/24/2020 15:21:08 [name] [name] ([id number]) -------------------------------------------------------------------------------- Wound Assessment Details Patient Name: [name] [name] Date of Service: 12/24/2020 3:15 PM Medical Record Number: [id number] Patient Account Number: [id number] Date of Birth/Sex: 01-21-1927 (87 y.o. F) Treating RN: Donnamarie Poag Primary Care Ger Ringenberg: Tracie Harrier Other Clinician: Jeanine Luz Referring Elenora Hawbaker: Tracie Harrier Treating Malena Timpone/Extender: Skipper Cliche in Treatment: 2 Wound Status Wound Number: 3 Primary Etiology: Venous Leg Ulcer Wound Location: Left, Posterior Lower Leg Wound Status: Open Wounding Event: Gradually Appeared Date Acquired: 09/30/2020 Weeks Of Treatment: 2 Clustered Wound: No Wound Measurements Length: (cm) 0.1 Width: (cm) 0.1 Depth: (cm) 0.1 Area: (cm) 0.008 Volume: (cm) 0.001 % Reduction in Area: 96.8% % Reduction in Volume: 96% Wound Description Classification: Full Thickness Without Exposed Support Structure s Treatment Notes Wound #3 (Lower Leg) Wound Laterality: Left,  Posterior Cleanser Peri-Wound Care Topical Primary Dressing Hydrofera Blue Ready Transfer Foam, 2.5x2.5 (in/in) Discharge Instruction: Apply Hydrofera Blue Ready to wound bed as directed Secondary Dressing Xtrasorb Medium 4x5 (in/in) Discharge Instruction: Apply to wound as directed. Do not cut. Secured With Compression Wrap Profore Lite LF 3 Multilayer Compression Bandaging System Discharge Instruction: Apply 3 multi-layer wrap as prescribed. Compression Stockings Jobst Farrow Wrap 4000 Quantity: 1 Left Leg Compression Amount: 30-40 mmHg Discharge Instruction: Apply to lower extremity as directed. Add-Ons Electronic Signature(s) Signed: 12/25/2020 10:57:38 AM By: Donnamarie Poag Entered ByDonnamarie Poag on 12/24/2020 15:20:45

## 2020-12-26 ENCOUNTER — Other Ambulatory Visit: Payer: Self-pay

## 2020-12-26 ENCOUNTER — Encounter: Payer: PPO | Admitting: Internal Medicine

## 2020-12-26 DIAGNOSIS — I87333 Chronic venous hypertension (idiopathic) with ulcer and inflammation of bilateral lower extremity: Secondary | ICD-10-CM | POA: Diagnosis not present

## 2020-12-26 DIAGNOSIS — L97212 Non-pressure chronic ulcer of right calf with fat layer exposed: Secondary | ICD-10-CM | POA: Diagnosis not present

## 2020-12-26 DIAGNOSIS — I872 Venous insufficiency (chronic) (peripheral): Secondary | ICD-10-CM | POA: Diagnosis not present

## 2020-12-27 NOTE — Progress Notes (Signed)
ABREANNA, DRAWDY ([id number]) Visit Report for 12/26/2020 Debridement Details Patient Name: [name] [name], [name] [name]. Date of Service: 12/26/2020 9:15 AM Medical Record Number: [id number] Patient Account Number: [id number] Date of Birth/Sex: 12-19-1926 (87 y.o. F) Treating RN: Cornell Barman Primary Care Provider: Tracie Harrier Other Clinician: Jeanine Luz Referring Provider: Tracie Harrier Treating Provider/Extender: Tito Dine in Treatment: 3 Debridement Performed for Wound #1 Right,Proximal,Posterior Lower Leg Assessment: Performed By: Physician Ricard Dillon, MD Debridement Type: Debridement Severity of Tissue Pre Debridement: Fat layer exposed Level of Consciousness (Pre- Awake and Alert procedure): Pre-procedure Verification/Time Out Yes - 09:37 Taken: Pain Control: Lidocaine Total Area Debrided (L x W): 1.1 (cm) x 0.7 (cm) = 0.77 (cm) Tissue and other material Viable, Non-Viable, Slough, Subcutaneous, Skin: Dermis , Skin: Epidermis, Slough debrided: Level: Skin/Subcutaneous Tissue Debridement Description: Excisional Instrument: Curette Bleeding: Moderate Hemostasis Achieved: Pressure Response to Treatment: Procedure was tolerated well Level of Consciousness (Post- Awake and Alert procedure): Post Debridement Measurements of Total Wound Length: (cm) 1.1 Width: (cm) 0.7 Depth: (cm) 0.2 Volume: (cm) 0.121 Character of Wound/Ulcer Post Debridement: Stable Severity of Tissue Post Debridement: Fat layer exposed Post Procedure Diagnosis Same as Pre-procedure Electronic Signature(s) Signed: 12/26/2020 5:25:07 PM By: Gretta Cool, BSN, RN, CWS, Kim RN, BSN Signed: 12/27/2020 7:59:07 AM By: Linton Ham MD Entered By: Linton Ham on 12/26/2020 10:05:46 [name] [name] ([id number]) -------------------------------------------------------------------------------- HPI Details Patient Name: [name] [name], [name] B. Date of Service: 12/26/2020 9:15 AM Medical Record Number:  [id number] Patient Account Number: [id number] Date of Birth/Sex: [birth year] (87 y.o. F) Treating RN: Cornell Barman Primary Care Provider: Tracie Harrier Other Clinician: Jeanine Luz Referring Provider: Tracie Harrier Treating Provider/Extender: Tito Dine in Treatment: 3 History of Present Illness HPI Description: ADMISSION 12/05/2020 This is a 87 year old woman who arrived accompanied by her daughter from [place]. She has been dealing with wounds on her bilateral posterior calf since January. There is no obvious antecedent injury. Her daughter tells me that she is actually had wounds that have opened and closed on her legs since last summer. She currently she has 2 wounds on the right posterior calf and 1 on the left. They have been using Vaseline on the wounds and covering the area with kerlix but they have not been healing. The patient has been wearing what appears to be support stockings. Past medical history includes paroxysmal A. fib, pacemaker,, hypertension, PVD, COPD, left mastectomy chronic lower extremity swelling ABIs were noncompressible bilateral 3/23; 1 week follow-up. Bilateral venous insufficiency ulcers right greater than left. As opposed to what I was worried about last week she actually tolerated 3 layer compression. We used Hydrofera Blue 3/30; bilateral venous insufficiency ulcers right greater than left. The area on the left is healed this week still 2 areas on the posterior right calf that are both measuring smaller. We have been using Hydrofera Blue under 3 layer compression she is. She seems to be tolerating this 4/6; initially with bilateral venous insufficiency ulcers although the area on the left is healed. She has a juxta lite stocking here which was properly applied by her daughter. She still has 2 areas on the posterior calf. These have been difficult in terms of maintaining a surface we have been using Hydrofera Blue under 3 layer  compression Electronic Signature(s) Signed: 12/27/2020 7:59:07 AM By: Linton Ham MD Entered By: Linton Ham on 12/26/2020 10:05:28 [name] [name] ([id number]) -------------------------------------------------------------------------------- Physical Exam Details Patient Name: [name] [name], [name] B. Date of Service: 12/26/2020 9:15 AM Medical Record Number:  [id number] Patient Account Number: [id number] Date of Birth/Sex: 20-Dec-1926 (87 y.o. F) Treating RN: Cornell Barman Primary Care Provider: Tracie Harrier Other Clinician: Jeanine Luz Referring Provider: Tracie Harrier Treating Provider/Extender: Ricard Dillon Weeks in Treatment: 3 Constitutional Sitting or standing Blood Pressure is within target range for patient.. Pulse regular and within target range for patient.Marland Kitchen Respirations regular, non- labored and within target range.. Temperature is normal and within the target range for the patient.Marland Kitchen appears in no distress. Respiratory Respiratory effort is easy and symmetric bilaterally. Rate is normal at rest and on room air.. Cardiovascular Pulses are palpable. Edema control is good. She had proper application of the juxta lite stocking on the left. Notes Wound exam; right posterior calf has 2 lesions. Again very difficult debride slough on the surface of both of these. I used a #3 curette on these I did not debride them last time although that may have been an area. She also has raised rounded edges around the wound I tried to address this is as well although it is difficult to position her. Hemostasis with direct pressure Electronic Signature(s) Signed: 12/27/2020 7:59:07 AM By: Linton Ham MD Entered By: Linton Ham on 12/26/2020 10:07:19 [name] [name] ([id number]) -------------------------------------------------------------------------------- Physician Orders Details Patient Name: [name] [name] Date of Service: 12/26/2020 9:15 AM Medical Record Number:  [id number] Patient Account Number: [id number] Date of Birth/Sex: [birth year] (87 y.o. F) Treating RN: Cornell Barman Primary Care Provider: Tracie Harrier Other Clinician: Jeanine Luz Referring Provider: Tracie Harrier Treating Provider/Extender: Tito Dine in Treatment: 3 Verbal / Phone Orders: No Diagnosis Coding Follow-up Appointments o Return Appointment in 1 week. o Nurse Visit as needed Bathing/ Shower/ Hygiene o May shower with wound dressing protected with water repellent cover or cast protector. Edema Control - Lymphedema / Segmental Compressive Device / Other Bilateral Lower Extremities o Optional: One layer of unna paste to top of compression wrap (to act as an anchor). o Elevate, Exercise Daily and Avoid Standing for Long Periods of Time. o Elevate legs to the level of the heart and pump ankles as often as possible o Elevate leg(s) parallel to the floor when sitting. Additional Orders / Instructions o Follow Nutritious Diet and Increase Protein Intake Wound Treatment Wound #1 - Lower Leg Wound Laterality: Right, Posterior, Proximal Primary Dressing: Hydrofera Blue Ready Transfer Foam, 2.5x2.5 (in/in) 1 x Per Week/15 Days Discharge Instructions: Apply Hydrofera Blue Ready to wound bed as directed Secondary Dressing: Xtrasorb Medium 4x5 (in/in) 1 x Per Week/15 Days Discharge Instructions: Apply to wound as directed. Do not cut. Compression Wrap: Profore Lite LF 3 Multilayer Compression Bandaging System 1 x Per Week/15 Days Discharge Instructions: Apply 3 multi-layer wrap as prescribed. Wound #2 - Lower Leg Wound Laterality: Right, Posterior, Distal Primary Dressing: Hydrofera Blue Ready Transfer Foam, 2.5x2.5 (in/in) 1 x Per Week/15 Days Discharge Instructions: Apply Hydrofera Blue Ready to wound bed as directed Secondary Dressing: Xtrasorb Medium 4x5 (in/in) 1 x Per Week/15 Days Discharge Instructions: Apply to wound as directed. Do not  cut. Compression Wrap: Profore Lite LF 3 Multilayer Compression Bandaging System 1 x Per Week/15 Days Discharge Instructions: Apply 3 multi-layer wrap as prescribed. Compression Stockings: Jobst Farrow Wrap 4000 Right Leg Compression Amount: 30-40 mmHG Discharge Instructions: Apply to lower extremity as directed. Electronic Signature(s) Signed: 12/26/2020 5:25:07 PM By: Gretta Cool, BSN, RN, CWS, Kim RN, BSN Signed: 12/27/2020 7:59:07 AM By: Linton Ham MD Entered By: Gretta Cool, BSN, RN, CWS, Kim on 12/26/2020 09:39:31 [name] [name] ([id number]) --------------------------------------------------------------------------------  Problem List Details Patient Name: [name] [name], MAZZARELLA. Date of Service: 12/26/2020 9:15 AM Medical Record Number: [id number] Patient Account Number: [id number] Date of Birth/Sex: 1926-11-08 (87 y.o. F) Treating RN: Cornell Barman Primary Care Provider: Tracie Harrier Other Clinician: Jeanine Luz Referring Provider: Tracie Harrier Treating Provider/Extender: Tito Dine in Treatment: 3 Active Problems ICD-10 Encounter Code Description Active Date MDM Diagnosis I87.333 Chronic venous hypertension (idiopathic) with ulcer and inflammation of 12/05/2020 No Yes bilateral lower extremity L97.221 Non-pressure chronic ulcer of left calf limited to breakdown of skin 12/05/2020 No Yes L97.211 Non-pressure chronic ulcer of right calf limited to breakdown of skin 12/05/2020 No Yes Inactive Problems Resolved Problems Electronic Signature(s) Signed: 12/27/2020 7:59:07 AM By: Linton Ham MD Entered By: Linton Ham on 12/26/2020 10:04:30 [name] [name] ([id number]) -------------------------------------------------------------------------------- Progress Note Details Patient Name: [name] [name] Date of Service: 12/26/2020 9:15 AM Medical Record Number: [id number] Patient Account Number: [id number] Date of Birth/Sex: 26-Dec-1926 (87 y.o. F) Treating RN: Cornell Barman Primary Care Provider: Tracie Harrier Other Clinician: Jeanine Luz Referring Provider: Tracie Harrier Treating Provider/Extender: Tito Dine in Treatment: 3 Subjective History of Present Illness (HPI) ADMISSION 12/05/2020 This is a 87 year old woman who arrived accompanied by her daughter from [place]. She has been dealing with wounds on her bilateral posterior calf since January. There is no obvious antecedent injury. Her daughter tells me that she is actually had wounds that have opened and closed on her legs since last summer. She currently she has 2 wounds on the right posterior calf and 1 on the left. They have been using Vaseline on the wounds and covering the area with kerlix but they have not been healing. The patient has been wearing what appears to be support stockings. Past medical history includes paroxysmal A. fib, pacemaker,, hypertension, PVD, COPD, left mastectomy chronic lower extremity swelling ABIs were noncompressible bilateral 3/23; 1 week follow-up. Bilateral venous insufficiency ulcers right greater than left. As opposed to what I was worried about last week she actually tolerated 3 layer compression. We used Hydrofera Blue 3/30; bilateral venous insufficiency ulcers right greater than left. The area on the left is healed this week still 2 areas on the posterior right calf that are both measuring smaller. We have been using Hydrofera Blue under 3 layer compression she is. She seems to be tolerating this 4/6; initially with bilateral venous insufficiency ulcers although the area on the left is healed. She has a juxta lite stocking here which was properly applied by her daughter. She still has 2 areas on the posterior calf. These have been difficult in terms of maintaining a surface we have been using Hydrofera Blue under 3 layer compression Objective Constitutional Sitting or standing Blood Pressure is within target range for patient..  Pulse regular and within target range for patient.Marland Kitchen Respirations regular, non- labored and within target range.. Temperature is normal and within the target range for the patient.Marland Kitchen appears in no distress. Vitals Time Taken: 9:20 AM, Height: 63 in, Weight: 174 lbs, BMI: 30.8, Temperature: 98 F, Pulse: 70 bpm, Respiratory Rate: 22 breaths/min, Blood Pressure: 121/65 mmHg. Respiratory Respiratory effort is easy and symmetric bilaterally. Rate is normal at rest and on room air.. Cardiovascular Pulses are palpable. Edema control is good. She had proper application of the juxta lite stocking on the left. General Notes: Wound exam; right posterior calf has 2 lesions. Again very difficult debride slough on the surface of both of these. I used a #3 curette on these  I did not debride them last time although that may have been an area. She also has raised rounded edges around the wound I tried to address this is as well although it is difficult to position her. Hemostasis with direct pressure Integumentary (Hair, Skin) Wound #1 status is Open. Original cause of wound was Gradually Appeared. The date acquired was: 09/30/2020. The wound has been in treatment 3 weeks. The wound is located on the Right,Proximal,Posterior Lower Leg. The wound measures 1.1cm length x 0.7cm width x 0.1cm depth; 0.605cm^2 area and 0.06cm^3 volume. There is Fat Layer (Subcutaneous Tissue) exposed. There is no tunneling or undermining noted. There is a medium amount of serosanguineous drainage noted. There is medium (34-66%) red granulation within the wound bed. There is a small (1-33%) amount of necrotic tissue within the wound bed including Adherent Slough. Wound #2 status is Open. Original cause of wound was Gradually Appeared. The date acquired was: 09/30/2020. The wound has been in treatment 3 weeks. The wound is located on the Right,Distal,Posterior Lower Leg. The wound measures 1.1cm length x 0.6cm width x 0.1cm depth; 0.518cm^2  area and 0.052cm^3 volume. There is Fat Layer (Subcutaneous Tissue) exposed. There is no tunneling or undermining noted. There is a medium amount of serosanguineous drainage noted. There is medium (34-66%) red granulation within the wound bed. There is a small (1-33%) amount of necrotic tissue within the wound bed including Adherent Slough. [name] [name], [name] [name] ([id number]) Wound #3 status is Open. Original cause of wound was Gradually Appeared. The date acquired was: 09/30/2020. The wound has been in treatment 3 weeks. The wound is located on the Left,Posterior Lower Leg. The wound measures 0cm length x 0cm width x 0cm depth; 0cm^2 area and 0cm^3 volume. Assessment Active Problems ICD-10 Chronic venous hypertension (idiopathic) with ulcer and inflammation of bilateral lower extremity Non-pressure chronic ulcer of left calf limited to breakdown of skin Non-pressure chronic ulcer of right calf limited to breakdown of skin Procedures Wound #1 Pre-procedure diagnosis of Wound #1 is a Venous Leg Ulcer located on the Right,Proximal,Posterior Lower Leg .Severity of Tissue Pre Debridement is: Fat layer exposed. There was a Excisional Skin/Subcutaneous Tissue Debridement with a total area of 0.77 sq cm performed by Ricard Dillon, MD. With the following instrument(s): Curette to remove Viable and Non-Viable tissue/material. Material removed includes Subcutaneous Tissue, Slough, Skin: Dermis, and Skin: Epidermis after achieving pain control using Lidocaine. No specimens were taken. A time out was conducted at 09:37, prior to the start of the procedure. A Moderate amount of bleeding was controlled with Pressure. The procedure was tolerated well. Post Debridement Measurements: 1.1cm length x 0.7cm width x 0.2cm depth; 0.121cm^3 volume. Character of Wound/Ulcer Post Debridement is stable. Severity of Tissue Post Debridement is: Fat layer exposed. Post procedure Diagnosis Wound #1: Same as  Pre-Procedure Plan Follow-up Appointments: Return Appointment in 1 week. Nurse Visit as needed Bathing/ Shower/ Hygiene: May shower with wound dressing protected with water repellent cover or cast protector. Edema Control - Lymphedema / Segmental Compressive Device / Other: Optional: One layer of unna paste to top of compression wrap (to act as an anchor). Elevate, Exercise Daily and Avoid Standing for Long Periods of Time. Elevate legs to the level of the heart and pump ankles as often as possible Elevate leg(s) parallel to the floor when sitting. Additional Orders / Instructions: Follow Nutritious Diet and Increase Protein Intake WOUND #1: - Lower Leg Wound Laterality: Right, Posterior, Proximal Primary Dressing: Hydrofera Blue Ready Transfer Foam, 2.5x2.5 (  in/in) 1 x Per Week/15 Days Discharge Instructions: Apply Hydrofera Blue Ready to wound bed as directed Secondary Dressing: Xtrasorb Medium 4x5 (in/in) 1 x Per Week/15 Days Discharge Instructions: Apply to wound as directed. Do not cut. Compression Wrap: Profore Lite LF 3 Multilayer Compression Bandaging System 1 x Per Week/15 Days Discharge Instructions: Apply 3 multi-layer wrap as prescribed. WOUND #2: - Lower Leg Wound Laterality: Right, Posterior, Distal Primary Dressing: Hydrofera Blue Ready Transfer Foam, 2.5x2.5 (in/in) 1 x Per Week/15 Days Discharge Instructions: Apply Hydrofera Blue Ready to wound bed as directed Secondary Dressing: Xtrasorb Medium 4x5 (in/in) 1 x Per Week/15 Days Discharge Instructions: Apply to wound as directed. Do not cut. Compression Wrap: Profore Lite LF 3 Multilayer Compression Bandaging System 1 x Per Week/15 Days Discharge Instructions: Apply 3 multi-layer wrap as prescribed. Compression Stockings: Jobst Farrow Wrap 4000 Compression Amount: 30-40 mmHg (right) Discharge Instructions: Apply to lower extremity as directed. [name] [name], [name] BMarland Kitchen ([id number]) 1. I continued with Hydrofera Blue 2.  Xtrasorb under 3 layer compression 3. If the adherent material keeps on forming on this I may need to change to Iodoflex under compression Electronic Signature(s) Signed: 12/27/2020 7:59:07 AM By: Linton Ham MD Entered By: Linton Ham on 12/26/2020 10:07:51 [name] [name] ([id number]) -------------------------------------------------------------------------------- SuperBill Details Patient Name: [name] [name]. Date of Service: 12/26/2020 Medical Record Number: [id number] Patient Account Number: [id number] Date of Birth/Sex: 02-09-1927 (87 y.o. F) Treating RN: Cornell Barman Primary Care Provider: Tracie Harrier Other Clinician: Jeanine Luz Referring Provider: Tracie Harrier Treating Provider/Extender: Tito Dine in Treatment: 3 Diagnosis Coding ICD-10 Codes Code Description [phone] Chronic venous hypertension (idiopathic) with ulcer and inflammation of bilateral lower extremity L97.221 Non-pressure chronic ulcer of left calf limited to breakdown of skin L97.211 Non-pressure chronic ulcer of right calf limited to breakdown of skin Facility Procedures CPT4 Code: [id number] Description: 77824 - DEB SUBQ TISSUE 20 SQ CM/< Modifier: Quantity: 1 CPT4 Code: Description: ICD-10 Diagnosis Description L97.211 Non-pressure chronic ulcer of right calf limited to breakdown of skin Modifier: Quantity: Physician Procedures CPT4 Code: 2353614 Description: 11042 - WC PHYS SUBQ TISS 20 SQ CM Modifier: Quantity: 1 CPT4 Code: Description: ICD-10 Diagnosis Description L97.211 Non-pressure chronic ulcer of right calf limited to breakdown of skin Modifier: Quantity: Electronic Signature(s) Signed: 12/27/2020 7:59:07 AM By: Linton Ham MD Entered By: Linton Ham on 12/26/2020 10:08:07

## 2020-12-28 NOTE — Progress Notes (Signed)
ALEXANDREA, WESTERGARD ([id number]) Visit Report for 12/26/2020 Arrival Information Details Patient Name: [name] [name], DEVERA. Date of Service: 12/26/2020 9:15 AM Medical Record Number: [id number] Patient Account Number: [id number] Date of Birth/Sex: 1927/04/17 (87 y.o. F) Treating RN: Donnamarie Poag Primary Care Lemma Tetro: Tracie Harrier Other Clinician: Jeanine Luz Referring Dymond Spreen: Tracie Harrier Treating Smantha Boakye/Extender: Tito Dine in Treatment: 3 Visit Information History Since Last Visit Any new allergies or adverse reactions: No Patient Arrived: Kasandra Knudsen Had a fall or experienced change in No Arrival Time: 09:13 activities of daily living that may affect Accompanied By: daughter risk of falls: Transfer Assistance: None Hospitalized since last visit: No Patient Identification Verified: Yes Pain Present Now: No Secondary Verification Process Completed: Yes Patient Requires Transmission-Based No Precautions: Patient Has Alerts: Yes Patient Alerts: Patient on Blood Thinner warfarin NON COMPRESSIBLE L and R NOT DIABETIC Electronic Signature(s) Signed: 12/26/2020 4:51:02 PM By: Donnamarie Poag Entered By: Donnamarie Poag on 12/26/2020 09:16:36 [name] [name] ([id number]) -------------------------------------------------------------------------------- Encounter Discharge Information Details Patient Name: [name] [name] Date of Service: 12/26/2020 9:15 AM Medical Record Number: [id number] Patient Account Number: [id number] Date of Birth/Sex: [birth year] (87 y.o. F) Treating RN: Carlene Coria Primary Care Zayan Delvecchio: Tracie Harrier Other Clinician: Jeanine Luz Referring Kambree Krauss: Tracie Harrier Treating Nery Kalisz/Extender: Tito Dine in Treatment: 3 Encounter Discharge Information Items Post Procedure Vitals Discharge Condition: Stable Temperature (F): 98 Ambulatory Status: Cane Pulse (bpm): 70 Discharge Destination: Home Respiratory Rate (breaths/min):  22 Transportation: Private Auto Blood Pressure (mmHg): 121/65 Accompanied By: daughter Schedule Follow-up Appointment: Yes Clinical Summary of Care: Electronic Signature(s) Signed: 12/28/2020 8:10:37 AM By: Carlene Coria RN Entered By: Carlene Coria on 12/26/2020 09:56:53 [name] [name] ([id number]) -------------------------------------------------------------------------------- Lower Extremity Assessment Details Patient Name: [name] [name] Date of Service: 12/26/2020 9:15 AM Medical Record Number: [id number] Patient Account Number: [id number] Date of Birth/Sex: 19-Dec-1926 (87 y.o. F) Treating RN: Donnamarie Poag Primary Care Lisandro Meggett: Tracie Harrier Other Clinician: Jeanine Luz Referring Benjiman Sedgwick: Tracie Harrier Treating Langston Summerfield/Extender: Ricard Dillon Weeks in Treatment: 3 Edema Assessment Assessed: [Left: No] [Right: Yes] [Left: Edema] [Right: :] Calf Left: Right: Point of Measurement: 31 cm From Medial Instep 32.8 cm Ankle Left: Right: Point of Measurement: 11 cm From Medial Instep 19.5 cm Vascular Assessment Pulses: Dorsalis Pedis Palpable: [Right:Yes] Electronic Signature(s) Signed: 12/26/2020 4:51:02 PM By: Donnamarie Poag Entered By: Donnamarie Poag on 12/26/2020 09:27:49 [name] [name] ([id number]) -------------------------------------------------------------------------------- Multi Wound Chart Details Patient Name: [name] [name] Date of Service: 12/26/2020 9:15 AM Medical Record Number: [id number] Patient Account Number: [id number] Date of Birth/Sex: 11/28/1926 (87 y.o. F) Treating RN: Cornell Barman Primary Care Kanyah Matsushima: Tracie Harrier Other Clinician: Jeanine Luz Referring Morgaine Kimball: Tracie Harrier Treating Rosemaria Inabinet/Extender: Tito Dine in Treatment: 3 Vital Signs Height(in): 63 Pulse(bpm): 54 Weight(lbs): 174 Blood Pressure(mmHg): 121/65 Body Mass Index(BMI): 31 Temperature(F): 98 Respiratory Rate(breaths/min): 22 Photos: [3:No  Photos] Wound Location: Right, Proximal, Posterior Lower Right, Distal, Posterior Lower Leg Left, Posterior Lower Leg Leg Wounding Event: Gradually Appeared Gradually Appeared Gradually Appeared Primary Etiology: Venous Leg Ulcer Venous Leg Ulcer Venous Leg Ulcer Comorbid History: Chronic Obstructive Pulmonary Chronic Obstructive Pulmonary N/A Disease (COPD), Arrhythmia, Disease (COPD), Arrhythmia, Congestive Heart Failure, Congestive Heart Failure, Hypertension, Received Hypertension, Received Chemotherapy Chemotherapy Date Acquired: 09/30/2020 09/30/2020 09/30/2020 Weeks of Treatment: 3 3 3  Wound Status: Open Open Open Measurements L x W x D (cm) 1.1x0.7x0.1 1.1x0.6x0.1 0x0x0 Area (cm) : 0.605 0.518 0 Volume (cm) : 0.06 0.052 0 % Reduction in Area: 72.50% 5.80% 100.00% %  Reduction in Volume: 72.70% 5.50% 100.00% Classification: Full Thickness Without Exposed Full Thickness Without Exposed Full Thickness Without Exposed Support Structures Support Structures Support Structures Exudate Amount: Medium Medium N/A Exudate Type: Serosanguineous Serosanguineous N/A Exudate Color: red, brown red, brown N/A Granulation Amount: Medium (34-66%) Medium (34-66%) N/A Granulation Quality: Red Red N/A Necrotic Amount: Small (1-33%) Small (1-33%) N/A Exposed Structures: Fat Layer (Subcutaneous Tissue): Fat Layer (Subcutaneous Tissue): N/A Yes Yes Fascia: No Fascia: No Tendon: No Tendon: No Muscle: No Muscle: No Joint: No Joint: No Bone: No Bone: No Debridement: Debridement - Excisional N/A N/A Pre-procedure Verification/Time 09:37 N/A N/A Out Taken: Pain Control: Lidocaine N/A N/A Tissue Debrided: Subcutaneous, Slough N/A N/A Level: Skin/Subcutaneous Tissue N/A N/A Debridement Area (sq cm): 0.77 N/A N/A Instrument: Curette N/A N/A Bleeding: Moderate N/A N/A Hemostasis Achieved: Pressure N/A N/A Procedure was tolerated well N/A N/A [name] [name], [name] [name] ([id number]) Debridement  Treatment Response: Post Debridement 1.1x0.7x0.2 N/A N/A Measurements L x W x D (cm) Post Debridement Volume: 0.121 N/A N/A (cm) Procedures Performed: Debridement N/A N/A Treatment Notes Wound #1 (Lower Leg) Wound Laterality: Right, Posterior, Proximal Cleanser Peri-Wound Care Topical Primary Dressing Hydrofera Blue Ready Transfer Foam, 2.5x2.5 (in/in) Discharge Instruction: Apply Hydrofera Blue Ready to wound bed as directed Secondary Dressing Xtrasorb Medium 4x5 (in/in) Discharge Instruction: Apply to wound as directed. Do not cut. Secured With Compression Wrap Profore Lite LF 3 Multilayer Compression Bandaging System Discharge Instruction: Apply 3 multi-layer wrap as prescribed. Compression Stockings Add-Ons Wound #2 (Lower Leg) Wound Laterality: Right, Posterior, Distal Cleanser Peri-Wound Care Topical Primary Dressing Hydrofera Blue Ready Transfer Foam, 2.5x2.5 (in/in) Discharge Instruction: Apply Hydrofera Blue Ready to wound bed as directed Secondary Dressing Xtrasorb Medium 4x5 (in/in) Discharge Instruction: Apply to wound as directed. Do not cut. Secured With Compression Wrap Profore Lite LF 3 Multilayer Compression Bandaging System Discharge Instruction: Apply 3 multi-layer wrap as prescribed. Compression Stockings Jobst Farrow Wrap 4000 Quantity: 1 Right Leg Compression Amount: 30-40 mmHg Discharge Instruction: Apply to lower extremity as directed. Add-Ons Wound #3 (Lower Leg) Wound Laterality: Left, Posterior [name] [name], [name] [name] ([id number]) Cleanser Peri-Wound Care Topical Primary Dressing Secondary Dressing Secured With Compression Wrap Compression Stockings Add-Ons Electronic Signature(s) Signed: 12/27/2020 7:59:07 AM By: Linton Ham MD Entered By: Linton Ham on 12/26/2020 10:04:39 [name] [name] ([id number]) -------------------------------------------------------------------------------- [place] Details Patient  Name: [name] [name], CIVELLO. Date of Service: 12/26/2020 9:15 AM Medical Record Number: [id number] Patient Account Number: [id number] Date of Birth/Sex: [birth year] (87 y.o. F) Treating RN: Cornell Barman Primary Care Yalexa Blust: Tracie Harrier Other Clinician: Jeanine Luz Referring Fumiye Lubben: Tracie Harrier Treating Luisangel Wainright/Extender: Tito Dine in Treatment: 3 Active Inactive Necrotic Tissue Nursing Diagnoses: Impaired tissue integrity related to necrotic/devitalized tissue Goals: Necrotic/devitalized tissue will be minimized in the wound bed Date Initiated: 12/05/2020 Target Resolution Date: 12/19/2020 Goal Status: Active Interventions: Assess patient pain level pre-, during and post procedure and prior to discharge Treatment Activities: Apply topical anesthetic as ordered : 12/05/2020 Notes: Orientation to the Wound Care Program Nursing Diagnoses: Knowledge deficit related to the wound healing center program Goals: Patient/caregiver will verbalize understanding of the [facility] Date Initiated: 12/05/2020 Target Resolution Date: 12/19/2020 Goal Status: Active Interventions: Provide education on orientation to the wound center Notes: Venous Leg Ulcer Nursing Diagnoses: Actual venous Insuffiency (use after diagnosis is confirmed) Goals: Patient will maintain optimal edema control Date Initiated: 12/05/2020 Target Resolution Date: 12/19/2020 Goal Status: Active Interventions: Assess peripheral edema status every visit. Compression as ordered Provide education  on venous insufficiency Treatment Activities: Therapeutic compression applied : 12/05/2020 Notes: Wound/Skin Impairment [name] [name], [name] [name] ([id number]) Nursing Diagnoses: Impaired tissue integrity Goals: Patient/caregiver will verbalize understanding of skin care regimen Date Initiated: 12/05/2020 Target Resolution Date: 12/05/2020 Goal Status: Active Ulcer/skin breakdown will have a volume  reduction of 30% by week 4 Date Initiated: 12/05/2020 Target Resolution Date: 01/05/2021 Goal Status: Active Interventions: Assess patient/caregiver ability to perform ulcer/skin care regimen upon admission and as needed Assess ulceration(s) every visit Treatment Activities: Skin care regimen initiated : 12/05/2020 Topical wound management initiated : 12/05/2020 Notes: Electronic Signature(s) Signed: 12/26/2020 5:25:07 PM By: Gretta Cool, BSN, RN, CWS, Kim RN, BSN Entered By: Gretta Cool, BSN, RN, CWS, Kim on 12/26/2020 09:35:22 [name] [name] ([id number]) -------------------------------------------------------------------------------- Pain Assessment Details Patient Name: [name] [name], [name] B. Date of Service: 12/26/2020 9:15 AM Medical Record Number: [id number] Patient Account Number: [id number] Date of Birth/Sex: March 07, 1927 (87 y.o. F) Treating RN: Donnamarie Poag Primary Care Kitty Cadavid: Tracie Harrier Other Clinician: Jeanine Luz Referring Lysha Schrade: Tracie Harrier Treating Matix Henshaw/Extender: Tito Dine in Treatment: 3 Active Problems Location of Pain Severity and Description of Pain Patient Has Paino No Site Locations Rate the pain. Current Pain Level: 0 Pain Management and Medication Current Pain Management: Electronic Signature(s) Signed: 12/26/2020 4:51:02 PM By: Donnamarie Poag Entered By: Donnamarie Poag on 12/26/2020 09:21:43 [name] [name] ([id number]) -------------------------------------------------------------------------------- Patient/Caregiver Education Details Patient Name: [name] [name] Date of Service: 12/26/2020 9:15 AM Medical Record Number: [id number] Patient Account Number: [id number] Date of Birth/Gender: 1927-09-21 (87 y.o. F) Treating RN: Cornell Barman Primary Care Physician: Tracie Harrier Other Clinician: Jeanine Luz Referring Physician: Tracie Harrier Treating Physician/Extender: Tito Dine in Treatment: 3 Education  Assessment Education Provided To: Patient Education Topics Provided Venous: Handouts: Controlling Swelling with Multilayered Compression Wraps Methods: Demonstration, Explain/Verbal Responses: State content correctly Wound/Skin Impairment: Handouts: Caring for Your Ulcer Methods: Demonstration, Explain/Verbal Responses: State content correctly Electronic Signature(s) Signed: 12/26/2020 5:25:07 PM By: Gretta Cool, BSN, RN, CWS, Kim RN, BSN Entered By: Gretta Cool, BSN, RN, CWS, Kim on 12/26/2020 09:40:09 [name] [name] ([id number]) -------------------------------------------------------------------------------- Wound Assessment Details Patient Name: [name] [name] Date of Service: 12/26/2020 9:15 AM Medical Record Number: [id number] Patient Account Number: [id number] Date of Birth/Sex: [birth year] (87 y.o. F) Treating RN: Donnamarie Poag Primary Care Chloe Flis: Tracie Harrier Other Clinician: Jeanine Luz Referring Ashlin Hidalgo: Tracie Harrier Treating Sahily Biddle/Extender: Tito Dine in Treatment: 3 Wound Status Wound Number: 1 Primary Venous Leg Ulcer Etiology: Wound Location: Right, Proximal, Posterior Lower Leg Wound Open Wounding Event: Gradually Appeared Status: Date Acquired: 09/30/2020 Comorbid Chronic Obstructive Pulmonary Disease (COPD), Weeks Of Treatment: 3 History: Arrhythmia, Congestive Heart Failure, Hypertension, Clustered Wound: No Received Chemotherapy Photos Wound Measurements Length: (cm) 1.1 Width: (cm) 0.7 Depth: (cm) 0.1 Area: (cm) 0.605 Volume: (cm) 0.06 % Reduction in Area: 72.5% % Reduction in Volume: 72.7% Tunneling: No Undermining: No Wound Description Classification: Full Thickness Without Exposed Support Structures Exudate Amount: Medium Exudate Type: Serosanguineous Exudate Color: red, brown Foul Odor After Cleansing: No Slough/Fibrino Yes Wound Bed Granulation Amount: Medium (34-66%) Exposed Structure Granulation Quality:  Red Fascia Exposed: No Necrotic Amount: Small (1-33%) Fat Layer (Subcutaneous Tissue) Exposed: Yes Necrotic Quality: Adherent Slough Tendon Exposed: No Muscle Exposed: No Joint Exposed: No Bone Exposed: No Treatment Notes Wound #1 (Lower Leg) Wound Laterality: Right, Posterior, Proximal Cleanser Peri-Wound Care Topical Primary Dressing [name] [name], [name] [name] ([id number]) Hydrofera Blue Ready Transfer Foam, 2.5x2.5 (in/in) Discharge Instruction: Apply Hydrofera Blue Ready to wound bed as directed Secondary Dressing Xtrasorb Medium  4x5 (in/in) Discharge Instruction: Apply to wound as directed. Do not cut. Secured With Compression Wrap Profore Lite LF 3 Multilayer Compression Bandaging System Discharge Instruction: Apply 3 multi-layer wrap as prescribed. Compression Stockings Add-Ons Electronic Signature(s) Signed: 12/26/2020 4:51:02 PM By: Donnamarie Poag Entered By: Donnamarie Poag on 12/26/2020 09:26:38 [name] [name] ([id number]) -------------------------------------------------------------------------------- Wound Assessment Details Patient Name: [name] [name] Date of Service: 12/26/2020 9:15 AM Medical Record Number: [id number] Patient Account Number: [id number] Date of Birth/Sex: 04/07/1927 (87 y.o. F) Treating RN: Donnamarie Poag Primary Care Tacie Mccuistion: Tracie Harrier Other Clinician: Jeanine Luz Referring Juwaun Inskeep: Tracie Harrier Treating Kadie Balestrieri/Extender: Tito Dine in Treatment: 3 Wound Status Wound Number: 2 Primary Venous Leg Ulcer Etiology: Wound Location: Right, Distal, Posterior Lower Leg Wound Open Wounding Event: Gradually Appeared Status: Date Acquired: 09/30/2020 Comorbid Chronic Obstructive Pulmonary Disease (COPD), Weeks Of Treatment: 3 History: Arrhythmia, Congestive Heart Failure, Hypertension, Clustered Wound: No Received Chemotherapy Photos Wound Measurements Length: (cm) 1.1 Width: (cm) 0.6 Depth: (cm) 0.1 Area: (cm) 0.518 Volume:  (cm) 0.052 % Reduction in Area: 5.8% % Reduction in Volume: 5.5% Tunneling: No Undermining: No Wound Description Classification: Full Thickness Without Exposed Support Structu Exudate Amount: Medium Exudate Type: Serosanguineous Exudate Color: red, brown res Foul Odor After Cleansing: No Slough/Fibrino Yes Wound Bed Granulation Amount: Medium (34-66%) Exposed Structure Granulation Quality: Red Fascia Exposed: No Necrotic Amount: Small (1-33%) Fat Layer (Subcutaneous Tissue) Exposed: Yes Necrotic Quality: Adherent Slough Tendon Exposed: No Muscle Exposed: No Joint Exposed: No Bone Exposed: No Treatment Notes Wound #2 (Lower Leg) Wound Laterality: Right, Posterior, Distal Cleanser Peri-Wound Care Topical Primary Dressing [name] [name], [name] [name] ([id number]) Hydrofera Blue Ready Transfer Foam, 2.5x2.5 (in/in) Discharge Instruction: Apply Hydrofera Blue Ready to wound bed as directed Secondary Dressing Xtrasorb Medium 4x5 (in/in) Discharge Instruction: Apply to wound as directed. Do not cut. Secured With Compression Wrap Profore Lite LF 3 Multilayer Compression Bandaging System Discharge Instruction: Apply 3 multi-layer wrap as prescribed. Compression Stockings Jobst Farrow Wrap 4000 Quantity: 1 Right Leg Compression Amount: 30-40 mmHg Discharge Instruction: Apply to lower extremity as directed. Add-Ons Electronic Signature(s) Signed: 12/26/2020 4:51:02 PM By: Donnamarie Poag Entered By: Donnamarie Poag on 12/26/2020 09:27:07 [name] [name] ([id number]) -------------------------------------------------------------------------------- Wound Assessment Details Patient Name: [name] [name] Date of Service: 12/26/2020 9:15 AM Medical Record Number: [id number] Patient Account Number: [id number] Date of Birth/Sex: 05-18-1927 (87 y.o. F) Treating RN: Donnamarie Poag Primary Care Kroy Sprung: Tracie Harrier Other Clinician: Jeanine Luz Referring Anacristina Steffek: Tracie Harrier Treating  Carron Jaggi/Extender: Tito Dine in Treatment: 3 Wound Status Wound Number: 3 Primary Etiology: Venous Leg Ulcer Wound Location: Left, Posterior Lower Leg Wound Status: Open Wounding Event: Gradually Appeared Date Acquired: 09/30/2020 Weeks Of Treatment: 3 Clustered Wound: No Wound Measurements Length: (cm) Width: (cm) Depth: (cm) Area: (cm) Volume: (cm) 0 % Reduction in Area: 100% 0 % Reduction in Volume: 100% 0 0 0 Wound Description Classification: Full Thickness Without Exposed Support Structu res Electronic Signature(s) Signed: 12/26/2020 4:51:02 PM By: Donnamarie Poag Entered By: Donnamarie Poag on 12/26/2020 09:25:01 [name] [name] ([id number]) -------------------------------------------------------------------------------- Vitals Details Patient Name: [name] [name] Date of Service: 12/26/2020 9:15 AM Medical Record Number: [id number] Patient Account Number: [id number] Date of Birth/Sex: 1927-09-03 (87 y.o. F) Treating RN: Donnamarie Poag Primary Care Owens Hara: Tracie Harrier Other Clinician: Jeanine Luz Referring Morrisa Aldaba: Tracie Harrier Treating Giovonni Poirier/Extender: Tito Dine in Treatment: 3 Vital Signs Time Taken: 09:20 Temperature (F): 98 Height (in): 63 Pulse (bpm): 70 Weight (lbs): 174 Respiratory Rate (breaths/min): 22  Body Mass Index (BMI): 30.8 Blood Pressure (mmHg): 121/65 Reference Range: 80 - 120 mg / dl Electronic Signature(s) Signed: 12/26/2020 4:51:02 PM By: Donnamarie Poag Entered ByDonnamarie Poag on 12/26/2020 09:21:35

## 2021-01-02 ENCOUNTER — Encounter: Payer: PPO | Admitting: Internal Medicine

## 2021-01-02 ENCOUNTER — Other Ambulatory Visit: Payer: Self-pay

## 2021-01-02 DIAGNOSIS — L97211 Non-pressure chronic ulcer of right calf limited to breakdown of skin: Secondary | ICD-10-CM | POA: Diagnosis not present

## 2021-01-02 DIAGNOSIS — L97221 Non-pressure chronic ulcer of left calf limited to breakdown of skin: Secondary | ICD-10-CM | POA: Diagnosis not present

## 2021-01-02 DIAGNOSIS — I87333 Chronic venous hypertension (idiopathic) with ulcer and inflammation of bilateral lower extremity: Secondary | ICD-10-CM | POA: Diagnosis not present

## 2021-01-02 NOTE — Progress Notes (Signed)
SHERLIE, BOYUM ([id number]) Visit Report for 01/02/2021 HPI Details Patient Name: [name] [name]. Date of Service: 01/02/2021 3:00 PM Medical Record Number: [id number] Patient Account Number: [id number] Date of Birth/Sex: 12/04/1926 (87 y.o. F) Treating RN: Dolan Amen Primary Care Provider: Tracie Harrier Other Clinician: Jeanine Luz Referring Provider: Tracie Harrier Treating Provider/Extender: Tito Dine in Treatment: 4 History of Present Illness HPI Description: ADMISSION 12/05/2020 This is a 87 year old woman who arrived accompanied by her daughter from [place]. She has been dealing with wounds on her bilateral posterior calf since January. There is no obvious antecedent injury. Her daughter tells me that she is actually had wounds that have opened and closed on her legs since last summer. She currently she has 2 wounds on the right posterior calf and 1 on the left. They have been using Vaseline on the wounds and covering the area with kerlix but they have not been healing. The patient has been wearing what appears to be support stockings. Past medical history includes paroxysmal A. fib, pacemaker,, hypertension, PVD, COPD, left mastectomy chronic lower extremity swelling ABIs were noncompressible bilateral 3/23; 1 week follow-up. Bilateral venous insufficiency ulcers right greater than left. As opposed to what I was worried about last week she actually tolerated 3 layer compression. We used Hydrofera Blue 3/30; bilateral venous insufficiency ulcers right greater than left. The area on the left is healed this week still 2 areas on the posterior right calf that are both measuring smaller. We have been using Hydrofera Blue under 3 layer compression she is. She seems to be tolerating this 4/6; initially with bilateral venous insufficiency ulcers although the area on the left is healed. She has a juxta lite stocking here which was properly applied by her  daughter. She still has 2 areas on the posterior calf. These have been difficult in terms of maintaining a surface we have been using Hydrofera Blue under 3 layer compression 4/13; patient initially came in with bilateral posterior calf venous insufficiency wounds. The area on the left are healed. She has a juxta lite stocking that she is using. She still has the 2 areas on the posterior calf. She said the pain in the larger area was so bad she took her wrap off 2 days ago. We have been using Hydrofera Blue Electronic Signature(s) Signed: 01/02/2021 5:03:12 PM By: Linton Ham MD Entered By: Linton Ham on 01/02/2021 16:49:57 [name] [name] ([id number]) -------------------------------------------------------------------------------- Physical Exam Details Patient Name: [name] [name] Date of Service: 01/02/2021 3:00 PM Medical Record Number: [id number] Patient Account Number: [id number] Date of Birth/Sex: 04/09/1927 (87 y.o. F) Treating RN: Dolan Amen Primary Care Provider: Tracie Harrier Other Clinician: Jeanine Luz Referring Provider: Tracie Harrier Treating Provider/Extender: Ricard Dillon Weeks in Treatment: 4 Constitutional Sitting or standing Blood Pressure is within target range for patient.. Pulse regular and within target range for patient.Marland Kitchen Respirations regular, non- labored and within target range.. Temperature is normal and within the target range for the patient.Marland Kitchen appears in no distress. Notes Wound exam; right posterior wound. 2 wounds remain. They have not really improved. Better looking surfaces however. There is no palpable tenderness around these wounds and I think very little evidence of infection. No clear reason why there should be so much pain Electronic Signature(s) Signed: 01/02/2021 5:03:12 PM By: Linton Ham MD Entered By: Linton Ham on 01/02/2021 16:50:49 [name] [name]  ([id number]) -------------------------------------------------------------------------------- Physician Orders Details Patient Name: [name] [name] Date of Service: 01/02/2021 3:00 PM Medical Record Number:  [id number] Patient Account Number: [id number] Date of Birth/Sex: 11-22-1926 (87 y.o. F) Treating RN: Dolan Amen Primary Care Provider: Tracie Harrier Other Clinician: Jeanine Luz Referring Provider: Tracie Harrier Treating Provider/Extender: Tito Dine in Treatment: 4 Verbal / Phone Orders: No Diagnosis Coding Follow-up Appointments o Return Appointment in 2 weeks. - MD visit o Nurse Visit as needed - Next week Bathing/ Shower/ Hygiene o May shower with wound dressing protected with water repellent cover or cast protector. Edema Control - Lymphedema / Segmental Compressive Device / Other Bilateral Lower Extremities o Optional: One layer of unna paste to top of compression wrap (to act as an anchor). o Elevate, Exercise Daily and Avoid Standing for Long Periods of Time. o Elevate legs to the level of the heart and pump ankles as often as possible o Elevate leg(s) parallel to the floor when sitting. Additional Orders / Instructions o Follow Nutritious Diet and Increase Protein Intake Wound Treatment Wound #1 - Lower Leg Wound Laterality: Right, Posterior, Proximal Cleanser: Soap and Water 1 x Per Week/15 Days Discharge Instructions: Gently cleanse wound with antibacterial soap, rinse and pat dry prior to dressing wounds Primary Dressing: Cutimed Sorbact 1.5x 2.38 (in/in) 1 x Per Week/15 Days Discharge Instructions: Moisten with hydrogel-A bacteria- and fungi binding wound dressing, suitable for cavities and fistulas. It is suitable as a wound filler and allows the passage of wound exudate into a secondary dressing. The dressing helps reducing odor and pain and can improve healing. Secondary Dressing: Xtrasorb Medium 4x5 (in/in) 1 x Per  Week/15 Days Discharge Instructions: Apply to wound as directed. Do not cut. Compression Wrap: Profore Lite LF 3 Multilayer Compression Bandaging System 1 x Per Week/15 Days Discharge Instructions: Apply 3 multi-layer wrap as prescribed. Wound #2 - Lower Leg Wound Laterality: Right, Posterior, Distal Cleanser: Soap and Water 1 x Per Week/15 Days Discharge Instructions: Gently cleanse wound with antibacterial soap, rinse and pat dry prior to dressing wounds Primary Dressing: Cutimed Sorbact 1.5x 2.38 (in/in) 1 x Per Week/15 Days Discharge Instructions: A bacteria- and fungi binding wound dressing, suitable for cavities and fistulas. It is suitable as a wound filler and allows the passage of wound exudate into a secondary dressing. The dressing helps reducing odor and pain and can improve healing. Secondary Dressing: Xtrasorb Medium 4x5 (in/in) 1 x Per Week/15 Days Discharge Instructions: Apply to wound as directed. Do not cut. Compression Wrap: Profore Lite LF 3 Multilayer Compression Bandaging System 1 x Per Week/15 Days Discharge Instructions: Apply 3 multi-layer wrap as prescribed. Laboratory o Bacteria identified in Wound by Culture (MICRO) - left lower leg oooo LOINC Code: 5621-3 oooo Convenience Name: Wound culture routine JAWANDA, PASSEY ([id number]) Electronic Signature(s) Signed: 01/02/2021 4:47:42 PM By: Charlett Nose RN Signed: 01/02/2021 5:03:12 PM By: Linton Ham MD Entered By: Georges Mouse, Minus Breeding on 01/02/2021 15:54:42 [name] [name] ([id number]) -------------------------------------------------------------------------------- Problem List Details Patient Name: OLEVIA, WESTERVELT. Date of Service: 01/02/2021 3:00 PM Medical Record Number: [id number] Patient Account Number: [id number] Date of Birth/Sex: Sep 13, 1927 (87 y.o. F) Treating RN: Dolan Amen Primary Care Provider: Tracie Harrier Other Clinician: Jeanine Luz Referring Provider: Tracie Harrier Treating Provider/Extender: Tito Dine in Treatment: 4 Active Problems ICD-10 Encounter Code Description Active Date MDM Diagnosis I87.333 Chronic venous hypertension (idiopathic) with ulcer and inflammation of 12/05/2020 No Yes bilateral lower extremity L97.221 Non-pressure chronic ulcer of left calf limited to breakdown of skin 12/05/2020 No Yes L97.211 Non-pressure chronic ulcer of right calf limited to breakdown of  skin 12/05/2020 No Yes Inactive Problems Resolved Problems Electronic Signature(s) Signed: 01/02/2021 5:03:12 PM By: Linton Ham MD Entered By: Linton Ham on 01/02/2021 16:48:24 [name] [name] ([id number]) -------------------------------------------------------------------------------- Progress Note Details Patient Name: [name] [name] Date of Service: 01/02/2021 3:00 PM Medical Record Number: [id number] Patient Account Number: [id number] Date of Birth/Sex: [birth year] (87 y.o. F) Treating RN: Dolan Amen Primary Care Provider: Tracie Harrier Other Clinician: Jeanine Luz Referring Provider: Tracie Harrier Treating Provider/Extender: Tito Dine in Treatment: 4 Subjective History of Present Illness (HPI) ADMISSION 12/05/2020 This is a 87 year old woman who arrived accompanied by her daughter from [place]. She has been dealing with wounds on her bilateral posterior calf since January. There is no obvious antecedent injury. Her daughter tells me that she is actually had wounds that have opened and closed on her legs since last summer. She currently she has 2 wounds on the right posterior calf and 1 on the left. They have been using Vaseline on the wounds and covering the area with kerlix but they have not been healing. The patient has been wearing what appears to be support stockings. Past medical history includes paroxysmal A. fib, pacemaker,, hypertension, PVD, COPD, left mastectomy chronic lower extremity  swelling ABIs were noncompressible bilateral 3/23; 1 week follow-up. Bilateral venous insufficiency ulcers right greater than left. As opposed to what I was worried about last week she actually tolerated 3 layer compression. We used Hydrofera Blue 3/30; bilateral venous insufficiency ulcers right greater than left. The area on the left is healed this week still 2 areas on the posterior right calf that are both measuring smaller. We have been using Hydrofera Blue under 3 layer compression she is. She seems to be tolerating this 4/6; initially with bilateral venous insufficiency ulcers although the area on the left is healed. She has a juxta lite stocking here which was properly applied by her daughter. She still has 2 areas on the posterior calf. These have been difficult in terms of maintaining a surface we have been using Hydrofera Blue under 3 layer compression 4/13; patient initially came in with bilateral posterior calf venous insufficiency wounds. The area on the left are healed. She has a juxta lite stocking that she is using. She still has the 2 areas on the posterior calf. She said the pain in the larger area was so bad she took her wrap off 2 days ago. We have been using Hydrofera Blue Objective Constitutional Sitting or standing Blood Pressure is within target range for patient.. Pulse regular and within target range for patient.Marland Kitchen Respirations regular, non- labored and within target range.. Temperature is normal and within the target range for the patient.Marland Kitchen appears in no distress. Vitals Time Taken: 3:21 PM, Height: 63 in, Weight: 174 lbs, BMI: 30.8, Temperature: 97.5 F, Pulse: 92 bpm, Respiratory Rate: 20 breaths/min, Blood Pressure: 105/60 mmHg. General Notes: Wound exam; right posterior wound. 2 wounds remain. They have not really improved. Better looking surfaces however. There is no palpable tenderness around these wounds and I think very little evidence of infection. No clear  reason why there should be so much pain Integumentary (Hair, Skin) Wound #1 status is Open. Original cause of wound was Gradually Appeared. The date acquired was: 09/30/2020. The wound has been in treatment 4 weeks. The wound is located on the Right,Proximal,Posterior Lower Leg. The wound measures 1.5cm length x 0.7cm width x 0.4cm depth; 0.825cm^2 area and 0.33cm^3 volume. There is Fat Layer (Subcutaneous Tissue) exposed. There is no tunneling  or undermining noted. There is a medium amount of serosanguineous drainage noted. There is medium (34-66%) red granulation within the wound bed. There is a small (1-33%) amount of necrotic tissue within the wound bed including Adherent Slough. Wound #2 status is Open. Original cause of wound was Gradually Appeared. The date acquired was: 09/30/2020. The wound has been in treatment 4 weeks. The wound is located on the Right,Distal,Posterior Lower Leg. The wound measures 1.2cm length x 0.5cm width x 0.3cm depth; 0.471cm^2 area and 0.141cm^3 volume. There is Fat Layer (Subcutaneous Tissue) exposed. There is no tunneling or undermining noted. There is a medium amount of serosanguineous drainage noted. There is medium (34-66%) red granulation within the wound bed. There is a small (1-33%) amount of necrotic tissue within the wound bed including Adherent Slough. PHYLIS, JAVED ([id number]) Assessment Active Problems ICD-10 Chronic venous hypertension (idiopathic) with ulcer and inflammation of bilateral lower extremity Non-pressure chronic ulcer of left calf limited to breakdown of skin Non-pressure chronic ulcer of right calf limited to breakdown of skin Procedures Wound #1 Pre-procedure diagnosis of Wound #1 is a Venous Leg Ulcer located on the Right,Proximal,Posterior Lower Leg . There was a Three Layer Compression Therapy Procedure by Dolan Amen, RN. Post procedure Diagnosis Wound #1: Same as Pre-Procedure Notes: pt tolerating wrap well. Wound  #2 Pre-procedure diagnosis of Wound #2 is a Venous Leg Ulcer located on the Right,Distal,Posterior Lower Leg . There was a Three Layer Compression Therapy Procedure by Dolan Amen, RN. Post procedure Diagnosis Wound #2: Same as Pre-Procedure Notes: pt tolerating wrap well. Plan Follow-up Appointments: Return Appointment in 2 weeks. - MD visit Nurse Visit as needed - Next week Bathing/ Shower/ Hygiene: May shower with wound dressing protected with water repellent cover or cast protector. Edema Control - Lymphedema / Segmental Compressive Device / Other: Optional: One layer of unna paste to top of compression wrap (to act as an anchor). Elevate, Exercise Daily and Avoid Standing for Long Periods of Time. Elevate legs to the level of the heart and pump ankles as often as possible Elevate leg(s) parallel to the floor when sitting. Additional Orders / Instructions: Follow Nutritious Diet and Increase Protein Intake Laboratory ordered were: Wound culture routine - left lower leg WOUND #1: - Lower Leg Wound Laterality: Right, Posterior, Proximal Cleanser: Soap and Water 1 x Per Week/15 Days Discharge Instructions: Gently cleanse wound with antibacterial soap, rinse and pat dry prior to dressing wounds Primary Dressing: Cutimed Sorbact 1.5x 2.38 (in/in) 1 x Per Week/15 Days Discharge Instructions: Moisten with hydrogel-A bacteria- and fungi binding wound dressing, suitable for cavities and fistulas. It is suitable as a wound filler and allows the passage of wound exudate into a secondary dressing. The dressing helps reducing odor and pain and can improve healing. Secondary Dressing: Xtrasorb Medium 4x5 (in/in) 1 x Per Week/15 Days Discharge Instructions: Apply to wound as directed. Do not cut. Compression Wrap: Profore Lite LF 3 Multilayer Compression Bandaging System 1 x Per Week/15 Days Discharge Instructions: Apply 3 multi-layer wrap as prescribed. WOUND #2: - Lower Leg Wound  Laterality: Right, Posterior, Distal Cleanser: Soap and Water 1 x Per Week/15 Days Discharge Instructions: Gently cleanse wound with antibacterial soap, rinse and pat dry prior to dressing wounds Primary Dressing: Cutimed Sorbact 1.5x 2.38 (in/in) 1 x Per Week/15 Days Discharge Instructions: A bacteria- and fungi binding wound dressing, suitable for cavities and fistulas. It is suitable as a wound filler and allows the passage of wound exudate into a secondary dressing.  The dressing helps reducing odor and pain and can improve healing. Secondary Dressing: Xtrasorb Medium 4x5 (in/in) 1 x Per Week/15 Days Discharge Instructions: Apply to wound as directed. Do not cut. Compression Wrap: Profore Lite LF 3 Multilayer Compression Bandaging System 1 x Per Week/15 Days Discharge Instructions: Apply 3 multi-layer wrap as prescribed. 1. I change the primary dressing to Sorbact 2. After some discussion 3 layer wrap again MESA, JANUS B. ([id number]) 3. For sake of completeness I cultured the larger of the 2 small areas but my level of suspicion for infection was not all that high. No empiric antibiotic Electronic Signature(s) Signed: 01/02/2021 5:03:12 PM By: Linton Ham MD Entered By: Linton Ham on 01/02/2021 16:51:40 [name] [name] ([id number]) -------------------------------------------------------------------------------- SuperBill Details Patient Name: [name] [name] Date of Service: 01/02/2021 Medical Record Number: [id number] Patient Account Number: [id number] Date of Birth/Sex: 12/02/1926 (87 y.o. F) Treating RN: Dolan Amen Primary Care Provider: Tracie Harrier Other Clinician: Jeanine Luz Referring Provider: Tracie Harrier Treating Provider/Extender: Tito Dine in Treatment: 4 Diagnosis Coding ICD-10 Codes Code Description [phone] Chronic venous hypertension (idiopathic) with ulcer and inflammation of bilateral lower extremity L97.221 Non-pressure  chronic ulcer of left calf limited to breakdown of skin L97.211 Non-pressure chronic ulcer of right calf limited to breakdown of skin Facility Procedures CPT4 Code: [id number] Description: (Facility Use Only) [place] RT LEG Modifier: Quantity: 1 Physician Procedures CPT4 Code Description: [id number] - WC PHYS LEVEL 3 - EST PT Modifier: Quantity: 1 CPT4 Code Description: ICD-10 Diagnosis Description I87.333 Chronic venous hypertension (idiopathic) with ulcer and inflammation of L97.211 Non-pressure chronic ulcer of right calf limited to breakdown of skin Modifier: bilateral lower extre Quantity: Financial trader) Signed: 01/02/2021 5:03:12 PM By: Linton Ham MD Previous Signature: 01/02/2021 4:47:42 PM Version By: Georges Mouse, Minus Breeding RN Entered By: Linton Ham on 01/02/2021 16:54:27

## 2021-01-03 ENCOUNTER — Other Ambulatory Visit
Admission: RE | Admit: 2021-01-03 | Discharge: 2021-01-03 | Disposition: A | Discharge status: Home / Self Care | Payer: PPO | Source: Ambulatory Visit | Attending: Internal Medicine | Admitting: Internal Medicine

## 2021-01-03 DIAGNOSIS — L089 Local infection of the skin and subcutaneous tissue, unspecified: Secondary | ICD-10-CM | POA: Insufficient documentation

## 2021-01-03 DIAGNOSIS — B999 Unspecified infectious disease: Secondary | ICD-10-CM | POA: Insufficient documentation

## 2021-01-03 NOTE — Progress Notes (Signed)
OAKLEE, ESTHER ([id number]) Visit Report for 01/02/2021 Arrival Information Details Patient Name: [name] [name], [name] [name]. Date of Service: 01/02/2021 3:00 PM Medical Record Number: [id number] Patient Account Number: [id number] Date of Birth/Sex: 1926-12-10 (87 y.o. F) Treating RN: Carlene Coria Primary Care Emile Ringgenberg: Tracie Harrier Other Clinician: Jeanine Luz Referring Dwan Fennel: Tracie Harrier Treating Curtez Brallier/Extender: Tito Dine in Treatment: 4 Visit Information History Since Last Visit All ordered tests and consults were completed: No Patient Arrived: Ambulatory Added or deleted any medications: No Arrival Time: 15:21 Any new allergies or adverse reactions: No Accompanied By: self Had a fall or experienced change in No Transfer Assistance: None activities of daily living that may affect Patient Identification Verified: Yes risk of falls: Secondary Verification Process Completed: Yes Signs or symptoms of abuse/neglect since last visito No Patient Requires Transmission-Based No Hospitalized since last visit: No Precautions: Implantable device outside of the clinic excluding No Patient Has Alerts: Yes cellular tissue based products placed in the center Patient Alerts: Patient on Blood Thinner since last visit: warfarin Has Dressing in Place as Prescribed: Yes NON COMPRESSIBLE L and Has Compression in Place as Prescribed: No R NOT DIABETIC Pain Present Now: No Electronic Signature(s) Signed: 01/03/2021 10:06:31 AM By: Carlene Coria RN Entered By: Carlene Coria on 01/02/2021 15:21:45 [name] [name] ([id number]) -------------------------------------------------------------------------------- Clinic Level of Care Assessment Details Patient Name: [name] [name] Date of Service: 01/02/2021 3:00 PM Medical Record Number: [id number] Patient Account Number: [id number] Date of Birth/Sex: March 10, 1927 (87 y.o. F) Treating RN: Dolan Amen Primary Care Heather Streeper:  Tracie Harrier Other Clinician: Jeanine Luz Referring Kamaljit Hizer: Tracie Harrier Treating Jeroline Wolbert/Extender: Tito Dine in Treatment: 4 Clinic Level of Care Assessment Items TOOL 1 Quantity Score []  - Use when EandM and Procedure is performed on INITIAL visit 0 ASSESSMENTS - Nursing Assessment / Reassessment []  - General Physical Exam (combine w/ comprehensive assessment (listed just below) when performed on new 0 pt. evals) []  - 0 Comprehensive Assessment (HX, ROS, Risk Assessments, Wounds Hx, etc.) ASSESSMENTS - Wound and Skin Assessment / Reassessment []  - Dermatologic / Skin Assessment (not related to wound area) 0 ASSESSMENTS - Ostomy and/or Continence Assessment and Care []  - Incontinence Assessment and Management 0 []  - 0 Ostomy Care Assessment and Management (repouching, etc.) PROCESS - Coordination of Care []  - Simple Patient / Family Education for ongoing care 0 []  - 0 Complex (extensive) Patient / Family Education for ongoing care []  - 0 Staff obtains Programmer, systems, Records, Test Results / Process Orders []  - 0 Staff telephones HHA, Nursing Homes / Clarify orders / etc []  - 0 Routine Transfer to another Facility (non-emergent condition) []  - 0 Routine Hospital Admission (non-emergent condition) []  - 0 New Admissions / Biomedical engineer / Ordering NPWT, Apligraf, etc. []  - 0 Emergency Hospital Admission (emergent condition) PROCESS - Special Needs []  - Pediatric / Minor Patient Management 0 []  - 0 Isolation Patient Management []  - 0 Hearing / Language / Visual special needs []  - 0 Assessment of Community assistance (transportation, D/C planning, etc.) []  - 0 Additional assistance / Altered mentation []  - 0 Support Surface(s) Assessment (bed, cushion, seat, etc.) INTERVENTIONS - Miscellaneous []  - External ear exam 0 []  - 0 Patient Transfer (multiple staff / Civil Service fast streamer / Similar devices) []  - 0 Simple Staple / Suture removal (25 or  less) []  - 0 Complex Staple / Suture removal (26 or more) []  - 0 Hypo/Hyperglycemic Management (do not check if billed separately) []  - 0 Ankle / Brachial  Index (ABI) - do not check if billed separately Has the patient been seen at the hospital within the last three years: Yes Total Score: 0 Level Of Care: ____ [name] [name] ([id number]) Electronic Signature(s) Signed: 01/02/2021 4:47:42 PM By: Georges Mouse, Minus Breeding RN Entered By: Georges Mouse, Minus Breeding on 01/02/2021 15:54:56 [name] [name] ([id number]) -------------------------------------------------------------------------------- Compression Therapy Details Patient Name: Early Osmond B. Date of Service: 01/02/2021 3:00 PM Medical Record Number: [id number] Patient Account Number: [id number] Date of Birth/Sex: 10-Jan-1927 (87 y.o. F) Treating RN: Dolan Amen Primary Care Jeorge Reister: Tracie Harrier Other Clinician: Jeanine Luz Referring Loyce Flaming: Tracie Harrier Treating Johncharles Fusselman/Extender: Tito Dine in Treatment: 4 Compression Therapy Performed for Wound Assessment: Wound #1 Right,Proximal,Posterior Lower Leg Performed By: Cora Daniels, RN Compression Type: Three Layer Post Procedure Diagnosis Same as Pre-procedure Notes pt tolerating wrap well Electronic Signature(s) Signed: 01/02/2021 4:47:42 PM By: Georges Mouse, Minus Breeding RN Entered By: Georges Mouse, Minus Breeding on 01/02/2021 15:52:06 [name] [name] ([id number]) -------------------------------------------------------------------------------- Compression Therapy Details Patient Name: [name] [name] Date of Service: 01/02/2021 3:00 PM Medical Record Number: [id number] Patient Account Number: [id number] Date of Birth/Sex: March 02, 1927 (87 y.o. F) Treating RN: Dolan Amen Primary Care Mattison Stuckey: Tracie Harrier Other Clinician: Jeanine Luz Referring Jassen Sarver: Tracie Harrier Treating Terrika Zuver/Extender: Tito Dine in  Treatment: 4 Compression Therapy Performed for Wound Assessment: Wound #2 Right,Distal,Posterior Lower Leg Performed By: Cora Daniels, RN Compression Type: Three Layer Post Procedure Diagnosis Same as Pre-procedure Notes pt tolerating wrap well Electronic Signature(s) Signed: 01/02/2021 4:47:42 PM By: Georges Mouse, Minus Breeding RN Entered By: Georges Mouse, Minus Breeding on 01/02/2021 15:52:06 [name] [name] ([id number]) -------------------------------------------------------------------------------- Encounter Discharge Information Details Patient Name: [name] [name] Date of Service: 01/02/2021 3:00 PM Medical Record Number: [id number] Patient Account Number: [id number] Date of Birth/Sex: Jul 08, 1927 (87 y.o. F) Treating RN: Carlene Coria Primary Care Taden Witter: Tracie Harrier Other Clinician: Jeanine Luz Referring Monique Hefty: Tracie Harrier Treating Kiyomi Pallo/Extender: Tito Dine in Treatment: 4 Encounter Discharge Information Items Discharge Condition: Stable Ambulatory Status: Cane Discharge Destination: Home Transportation: Private Auto Accompanied By: self Schedule Follow-up Appointment: Yes Clinical Summary of Care: Patient Declined Electronic Signature(s) Signed: 01/03/2021 10:06:31 AM By: Carlene Coria RN Entered By: Carlene Coria on 01/02/2021 16:10:13 [name] [name] ([id number]) -------------------------------------------------------------------------------- Lower Extremity Assessment Details Patient Name: [name] [name] Date of Service: 01/02/2021 3:00 PM Medical Record Number: [id number] Patient Account Number: [id number] Date of Birth/Sex: March 21, 1927 (87 y.o. F) Treating RN: Carlene Coria Primary Care Danayah Smyre: Tracie Harrier Other Clinician: Jeanine Luz Referring Denman Pichardo: Tracie Harrier Treating Brion Hedges/Extender: Ricard Dillon Weeks in Treatment: 4 Edema Assessment Assessed: [Left: No] [Right: No] [Left: Edema] [Right:  :] Calf Left: Right: Point of Measurement: 31 cm From Medial Instep 31 cm Ankle Left: Right: Point of Measurement: 11 cm From Medial Instep 18.7 cm Vascular Assessment Pulses: Dorsalis Pedis Palpable: [Right:Yes] Electronic Signature(s) Signed: 01/03/2021 10:06:31 AM By: Carlene Coria RN Entered By: Carlene Coria on 01/02/2021 15:33:16 [name] [name] ([id number]) -------------------------------------------------------------------------------- Multi Wound Chart Details Patient Name: [name] [name] Date of Service: 01/02/2021 3:00 PM Medical Record Number: [id number] Patient Account Number: [id number] Date of Birth/Sex: 1927-09-06 (87 y.o. F) Treating RN: Dolan Amen Primary Care Micala Saltsman: Tracie Harrier Other Clinician: Jeanine Luz Referring Kanishk Stroebel: Tracie Harrier Treating Baker Moronta/Extender: Tito Dine in Treatment: 4 Vital Signs Height(in): 63 Pulse(bpm): 73 Weight(lbs): 174 Blood Pressure(mmHg): 105/60 Body Mass Index(BMI): 31 Temperature(F): 97.5 Respiratory Rate(breaths/min): 20 Photos: [N/A:N/A] Wound Location: Right, Proximal, Posterior Lower Right, Distal, Posterior Lower Leg  N/A Leg Wounding Event: Gradually Appeared Gradually Appeared N/A Primary Etiology: Venous Leg Ulcer Venous Leg Ulcer N/A Comorbid History: Chronic Obstructive Pulmonary Chronic Obstructive Pulmonary N/A Disease (COPD), Arrhythmia, Disease (COPD), Arrhythmia, Congestive Heart Failure, Congestive Heart Failure, Hypertension, Received Hypertension, Received Chemotherapy Chemotherapy Date Acquired: 09/30/2020 09/30/2020 N/A Weeks of Treatment: 4 4 N/A Wound Status: Open Open N/A Measurements L x W x D (cm) 1.5x0.7x0.4 1.2x0.5x0.3 N/A Area (cm) : 0.825 0.471 N/A Volume (cm) : 0.33 0.141 N/A % Reduction in Area: 62.50% 14.40% N/A % Reduction in Volume: -50.00% -156.40% N/A Classification: Full Thickness Without Exposed Full Thickness Without Exposed N/A Support  Structures Support Structures Exudate Amount: Medium Medium N/A Exudate Type: Serosanguineous Serosanguineous N/A Exudate Color: red, brown red, brown N/A Granulation Amount: Medium (34-66%) Medium (34-66%) N/A Granulation Quality: Red Red N/A Necrotic Amount: Small (1-33%) Small (1-33%) N/A Exposed Structures: Fat Layer (Subcutaneous Tissue): Fat Layer (Subcutaneous Tissue): N/A Yes Yes Fascia: No Fascia: No Tendon: No Tendon: No Muscle: No Muscle: No Joint: No Joint: No Bone: No Bone: No Epithelialization: None None N/A Procedures Performed: Compression Therapy Compression Therapy N/A Treatment Notes Wound #1 (Lower Leg) Wound Laterality: Right, Posterior, Proximal Cleanser Soap and Water TIEN, AISPURO ([id number]) Discharge Instruction: Gently cleanse wound with antibacterial soap, rinse and pat dry prior to dressing wounds Peri-Wound Care Topical Primary Dressing Cutimed Sorbact 1.5x 2.38 (in/in) Discharge Instruction: Moisten with hydrogel-A bacteria- and fungi binding wound dressing, suitable for cavities and fistulas. It is suitable as a wound filler and allows the passage of wound exudate into a secondary dressing. The dressing helps reducing odor and pain and can improve healing. Secondary Dressing Xtrasorb Medium 4x5 (in/in) Discharge Instruction: Apply to wound as directed. Do not cut. Secured With Compression Wrap Profore Lite LF 3 Multilayer Compression Bandaging System Discharge Instruction: Apply 3 multi-layer wrap as prescribed. Compression Stockings Add-Ons Wound #2 (Lower Leg) Wound Laterality: Right, Posterior, Distal Cleanser Soap and Water Discharge Instruction: Gently cleanse wound with antibacterial soap, rinse and pat dry prior to dressing wounds Peri-Wound Care Topical Primary Dressing Cutimed Sorbact 1.5x 2.38 (in/in) Discharge Instruction: A bacteria- and fungi binding wound dressing, suitable for cavities and fistulas. It is suitable  as a wound filler and allows the passage of wound exudate into a secondary dressing. The dressing helps reducing odor and pain and can improve healing. Secondary Dressing Xtrasorb Medium 4x5 (in/in) Discharge Instruction: Apply to wound as directed. Do not cut. Secured With Compression Wrap Profore Lite LF 3 Multilayer Compression Bandaging System Discharge Instruction: Apply 3 multi-layer wrap as prescribed. Compression Stockings Add-Ons Electronic Signature(s) Signed: 01/02/2021 5:03:12 PM By: Linton Ham MD Previous Signature: 01/02/2021 4:47:42 PM Version By: Georges Mouse, Minus Breeding RN Entered By: Linton Ham on 01/02/2021 16:49:15 [name] [name] ([id number]) -------------------------------------------------------------------------------- New Alluwe Details Patient Name: VERITA, KURODA. Date of Service: 01/02/2021 3:00 PM Medical Record Number: [id number] Patient Account Number: [id number] Date of Birth/Sex: [birth year] (87 y.o. F) Treating RN: Dolan Amen Primary Care Channing Yeager: Tracie Harrier Other Clinician: Jeanine Luz Referring Sibley Rolison: Tracie Harrier Treating Johanny Segers/Extender: Tito Dine in Treatment: 4 Active Inactive Necrotic Tissue Nursing Diagnoses: Impaired tissue integrity related to necrotic/devitalized tissue Goals: Necrotic/devitalized tissue will be minimized in the wound bed Date Initiated: 12/05/2020 Target Resolution Date: 12/19/2020 Goal Status: Active Interventions: Assess patient pain level pre-, during and post procedure and prior to discharge Treatment Activities: Apply topical anesthetic as ordered : 12/05/2020 Notes: Venous Leg Ulcer Nursing Diagnoses: Actual venous Insuffiency (use after diagnosis  is confirmed) Goals: Patient will maintain optimal edema control Date Initiated: 12/05/2020 Target Resolution Date: 12/19/2020 Goal Status: Active Interventions: Assess peripheral edema status every  visit. Compression as ordered Provide education on venous insufficiency Treatment Activities: Therapeutic compression applied : 12/05/2020 Notes: Wound/Skin Impairment Nursing Diagnoses: Impaired tissue integrity Goals: Patient/caregiver will verbalize understanding of skin care regimen Date Initiated: 12/05/2020 Target Resolution Date: 12/05/2020 Goal Status: Active Ulcer/skin breakdown will have a volume reduction of 30% by week 4 Date Initiated: 12/05/2020 Target Resolution Date: 01/05/2021 Goal Status: Active Interventions: Assess patient/caregiver ability to perform ulcer/skin care regimen upon admission and as needed Assess ulceration(s) every visit IDALIS, HOELTING ([id number]) Treatment Activities: Skin care regimen initiated : 12/05/2020 Topical wound management initiated : 12/05/2020 Notes: Electronic Signature(s) Signed: 01/02/2021 4:47:42 PM By: Georges Mouse, Minus Breeding RN Entered By: Georges Mouse, Minus Breeding on 01/02/2021 15:48:31 [name] [name] ([id number]) -------------------------------------------------------------------------------- Pain Assessment Details Patient Name: [name] [name] Date of Service: 01/02/2021 3:00 PM Medical Record Number: [id number] Patient Account Number: [id number] Date of Birth/Sex: Jan 21, 1927 (87 y.o. F) Treating RN: Carlene Coria Primary Care Shray Hunley: Tracie Harrier Other Clinician: Jeanine Luz Referring Vale Mousseau: Tracie Harrier Treating Jesus Nevills/Extender: Tito Dine in Treatment: 4 Active Problems Location of Pain Severity and Description of Pain Patient Has Paino No Site Locations Pain Management and Medication Current Pain Management: Electronic Signature(s) Signed: 01/03/2021 10:06:31 AM By: Carlene Coria RN Entered By: Carlene Coria on 01/02/2021 15:22:21 [name] [name] ([id number]) -------------------------------------------------------------------------------- Patient/Caregiver Education Details Patient  Name: [name] [name] Date of Service: 01/02/2021 3:00 PM Medical Record Number: [id number] Patient Account Number: [id number] Date of Birth/Gender: 12-09-1926 (87 y.o. F) Treating RN: Dolan Amen Primary Care Physician: Tracie Harrier Other Clinician: Jeanine Luz Referring Physician: Tracie Harrier Treating Physician/Extender: Tito Dine in Treatment: 4 Education Assessment Education Provided To: Patient Education Topics Provided Wound/Skin Impairment: Methods: Explain/Verbal Responses: State content correctly Notes Hydration education by MD Electronic Signature(s) Signed: 01/02/2021 4:47:42 PM By: Georges Mouse, Minus Breeding RN Entered By: Georges Mouse, Minus Breeding on 01/02/2021 15:55:25 [name] [name] ([id number]) -------------------------------------------------------------------------------- Wound Assessment Details Patient Name: [name] [name] Date of Service: 01/02/2021 3:00 PM Medical Record Number: [id number] Patient Account Number: [id number] Date of Birth/Sex: 12-Dec-1926 (87 y.o. F) Treating RN: Carlene Coria Primary Care Jennesis Ramaswamy: Tracie Harrier Other Clinician: Jeanine Luz Referring Ernesto Lashway: Tracie Harrier Treating Airianna Kreischer/Extender: Tito Dine in Treatment: 4 Wound Status Wound Number: 1 Primary Venous Leg Ulcer Etiology: Wound Location: Right, Proximal, Posterior Lower Leg Wound Open Wounding Event: Gradually Appeared Status: Date Acquired: 09/30/2020 Comorbid Chronic Obstructive Pulmonary Disease (COPD), Weeks Of Treatment: 4 History: Arrhythmia, Congestive Heart Failure, Hypertension, Clustered Wound: No Received Chemotherapy Photos Wound Measurements Length: (cm) 1.5 Width: (cm) 0.7 Depth: (cm) 0.4 Area: (cm) 0.825 Volume: (cm) 0.33 % Reduction in Area: 62.5% % Reduction in Volume: -50% Epithelialization: None Tunneling: No Undermining: No Wound Description Classification: Full Thickness Without Exposed  Support Structures Exudate Amount: Medium Exudate Type: Serosanguineous Exudate Color: red, brown Foul Odor After Cleansing: No Slough/Fibrino Yes Wound Bed Granulation Amount: Medium (34-66%) Exposed Structure Granulation Quality: Red Fascia Exposed: No Necrotic Amount: Small (1-33%) Fat Layer (Subcutaneous Tissue) Exposed: Yes Necrotic Quality: Adherent Slough Tendon Exposed: No Muscle Exposed: No Joint Exposed: No Bone Exposed: No Treatment Notes Wound #1 (Lower Leg) Wound Laterality: Right, Posterior, Proximal Cleanser Soap and Water Discharge Instruction: Gently cleanse wound with antibacterial soap, rinse and pat dry prior to dressing wounds Peri-Wound Care JAZIRA, MALONEY ([id number]) Topical Primary Dressing Cutimed Sorbact  1.5x 2.38 (in/in) Discharge Instruction: Moisten with hydrogel-A bacteria- and fungi binding wound dressing, suitable for cavities and fistulas. It is suitable as a wound filler and allows the passage of wound exudate into a secondary dressing. The dressing helps reducing odor and pain and can improve healing. Secondary Dressing Xtrasorb Medium 4x5 (in/in) Discharge Instruction: Apply to wound as directed. Do not cut. Secured With Compression Wrap Profore Lite LF 3 Multilayer Compression Bandaging System Discharge Instruction: Apply 3 multi-layer wrap as prescribed. Compression Stockings Add-Ons Electronic Signature(s) Signed: 01/03/2021 10:06:31 AM By: Carlene Coria RN Entered By: Carlene Coria on 01/02/2021 15:32:13 [name] [name] ([id number]) -------------------------------------------------------------------------------- Wound Assessment Details Patient Name: [name] [name] Date of Service: 01/02/2021 3:00 PM Medical Record Number: [id number] Patient Account Number: [id number] Date of Birth/Sex: [birth year] (87 y.o. F) Treating RN: Carlene Coria Primary Care Mj Willis: Tracie Harrier Other Clinician: Jeanine Luz Referring Tinzley Dalia:  Tracie Harrier Treating Tranell Wojtkiewicz/Extender: Tito Dine in Treatment: 4 Wound Status Wound Number: 2 Primary Venous Leg Ulcer Etiology: Wound Location: Right, Distal, Posterior Lower Leg Wound Open Wounding Event: Gradually Appeared Status: Date Acquired: 09/30/2020 Comorbid Chronic Obstructive Pulmonary Disease (COPD), Weeks Of Treatment: 4 History: Arrhythmia, Congestive Heart Failure, Hypertension, Clustered Wound: No Received Chemotherapy Photos Wound Measurements Length: (cm) 1.2 Width: (cm) 0.5 Depth: (cm) 0.3 Area: (cm) 0.471 Volume: (cm) 0.141 % Reduction in Area: 14.4% % Reduction in Volume: -156.4% Epithelialization: None Tunneling: No Undermining: No Wound Description Classification: Full Thickness Without Exposed Support Structures Exudate Amount: Medium Exudate Type: Serosanguineous Exudate Color: red, brown Foul Odor After Cleansing: No Slough/Fibrino Yes Wound Bed Granulation Amount: Medium (34-66%) Exposed Structure Granulation Quality: Red Fascia Exposed: No Necrotic Amount: Small (1-33%) Fat Layer (Subcutaneous Tissue) Exposed: Yes Necrotic Quality: Adherent Slough Tendon Exposed: No Muscle Exposed: No Joint Exposed: No Bone Exposed: No Treatment Notes Wound #2 (Lower Leg) Wound Laterality: Right, Posterior, Distal Cleanser Soap and Water Discharge Instruction: Gently cleanse wound with antibacterial soap, rinse and pat dry prior to dressing wounds Peri-Wound Care ZARAHI, FUERST ([id number]) Topical Primary Dressing Cutimed Sorbact 1.5x 2.38 (in/in) Discharge Instruction: A bacteria- and fungi binding wound dressing, suitable for cavities and fistulas. It is suitable as a wound filler and allows the passage of wound exudate into a secondary dressing. The dressing helps reducing odor and pain and can improve healing. Secondary Dressing Xtrasorb Medium 4x5 (in/in) Discharge Instruction: Apply to wound as directed. Do not  cut. Secured With Compression Wrap Profore Lite LF 3 Multilayer Compression Bandaging System Discharge Instruction: Apply 3 multi-layer wrap as prescribed. Compression Stockings Add-Ons Electronic Signature(s) Signed: 01/03/2021 10:06:31 AM By: Carlene Coria RN Entered By: Carlene Coria on 01/02/2021 15:32:42 [name] [name] ([id number]) -------------------------------------------------------------------------------- Vitals Details Patient Name: [name] [name] Date of Service: 01/02/2021 3:00 PM Medical Record Number: [id number] Patient Account Number: [id number] Date of Birth/Sex: Jun 26, 1927 (87 y.o. F) Treating RN: Carlene Coria Primary Care Kelcy Laible: Tracie Harrier Other Clinician: Jeanine Luz Referring Jayten Gabbard: Tracie Harrier Treating Shery [place]uneka/Extender: Tito Dine in Treatment: 4 Vital Signs Time Taken: 15:21 Temperature (F): 97.5 Height (in): 63 Pulse (bpm): 92 Weight (lbs): 174 Respiratory Rate (breaths/min): 20 Body Mass Index (BMI): 30.8 Blood Pressure (mmHg): 105/60 Reference Range: 80 - 120 mg / dl Electronic Signature(s) Signed: 01/03/2021 10:06:31 AM By: Carlene Coria RN Entered By: Carlene Coria on 01/02/2021 15:22:07

## 2021-01-05 LAB — AEROBIC CULTURE W GRAM STAIN (SUPERFICIAL SPECIMEN)
Culture: NORMAL
Gram Stain: NONE SEEN

## 2021-01-09 ENCOUNTER — Other Ambulatory Visit: Payer: Self-pay

## 2021-01-09 DIAGNOSIS — I87333 Chronic venous hypertension (idiopathic) with ulcer and inflammation of bilateral lower extremity: Secondary | ICD-10-CM | POA: Diagnosis not present

## 2021-01-09 DIAGNOSIS — L97212 Non-pressure chronic ulcer of right calf with fat layer exposed: Secondary | ICD-10-CM | POA: Diagnosis not present

## 2021-01-09 DIAGNOSIS — I87331 Chronic venous hypertension (idiopathic) with ulcer and inflammation of right lower extremity: Secondary | ICD-10-CM | POA: Diagnosis not present

## 2021-01-09 NOTE — Progress Notes (Signed)
[name] [name], [name] [name] ([id number]) Visit Report for 01/09/2021 Chief Complaint Document Details Patient Name: [name] [name], [name] [name]. Date of Service: 01/09/2021 10:15 AM Medical Record Number: [id number] Patient Account Number: [id number] Date of Birth/Sex: 12/03/1926 (87 y.o. F) Treating RN: Cornell Barman Primary Care Provider: Tracie Harrier Other Clinician: Jeanine Luz Referring Provider: Tracie Harrier Treating Provider/Extender: Skipper Cliche in Treatment: 5 Information Obtained from: Patient Chief Complaint 12/05/2020; patient is here for review of 2 small wounds on the right posterior calf and 1 on the left posterior calf Electronic Signature(s) Signed: 01/09/2021 4:00:06 PM By: Worthy Keeler PA-C Entered By: Worthy Keeler on 01/09/2021 16:00:06 [name] [name] ([id number]) -------------------------------------------------------------------------------- HPI Details Patient Name: [name] [name] Date of Service: 01/09/2021 10:15 AM Medical Record Number: [id number] Patient Account Number: [id number] Date of Birth/Sex: 08-04-1927 (87 y.o. F) Treating RN: Cornell Barman Primary Care Provider: Tracie Harrier Other Clinician: Jeanine Luz Referring Provider: Tracie Harrier Treating Provider/Extender: Skipper Cliche in Treatment: 5 History of Present Illness HPI Description: ADMISSION 12/05/2020 This is a 87 year old woman who arrived accompanied by her daughter from [place]. She has been dealing with wounds on her bilateral posterior calf since January. There is no obvious antecedent injury. Her daughter tells me that she is actually had wounds that have opened and closed on her legs since last summer. She currently she has 2 wounds on the right posterior calf and 1 on the left. They have been using Vaseline on the wounds and covering the area with kerlix but they have not been healing. The patient has been wearing what appears to be support stockings. Past medical  history includes paroxysmal A. fib, pacemaker,, hypertension, PVD, COPD, left mastectomy chronic lower extremity swelling ABIs were noncompressible bilateral 3/23; 1 week follow-up. Bilateral venous insufficiency ulcers right greater than left. As opposed to what I was worried about last week she actually tolerated 3 layer compression. We used Hydrofera Blue 3/30; bilateral venous insufficiency ulcers right greater than left. The area on the left is healed this week still 2 areas on the posterior right calf that are both measuring smaller. We have been using Hydrofera Blue under 3 layer compression she is. She seems to be tolerating this 4/6; initially with bilateral venous insufficiency ulcers although the area on the left is healed. She has a juxta lite stocking here which was properly applied by her daughter. She still has 2 areas on the posterior calf. These have been difficult in terms of maintaining a surface we have been using Hydrofera Blue under 3 layer compression 4/13; patient initially came in with bilateral posterior calf venous insufficiency wounds. The area on the left are healed. She has a juxta lite stocking that she is using. She still has the 2 areas on the posterior calf. She said the pain in the larger area was so bad she took her wrap off 2 days ago. We have been using Hydrofera Blue 01/09/21 upon evaluation today patient appears to be doing well in regards to her wound. I did not see that per se today I saw the picture but she actually had to come back and do the fact that her toes were somewhat abnormally discolored and the nurse was concerned about it. The patient was kind enough to come back in later in the afternoon for me to have a look. The good news is that this does not appear to be as discolored but I am concerned actually about the possibility of gout based on what I  see. Electronic Signature(s) Signed: 01/09/2021 4:34:32 PM By: Worthy Keeler PA-C Entered By: Worthy Keeler on 01/09/2021 16:34:31 [name] [name] ([id number]) -------------------------------------------------------------------------------- Physical Exam Details Patient Name: [name] [name] Date of Service: 01/09/2021 10:15 AM Medical Record Number: [id number] Patient Account Number: [id number] Date of Birth/Sex: 1927-09-13 (87 y.o. F) Treating RN: Cornell Barman Primary Care Provider: Tracie Harrier Other Clinician: Jeanine Luz Referring Provider: Tracie Harrier Treating Provider/Extender: Skipper Cliche in Treatment: 5 Constitutional Well-nourished and well-hydrated in no acute distress. Respiratory normal breathing without difficulty. Psychiatric this patient is able to make decisions and demonstrates good insight into disease process. Alert and Oriented x 3. pleasant and cooperative. Notes The patient does not appear to show any signs of infection right now which is great news and I am very pleased in that regard. With that being said I think that the biggest issue right now or the biggest possibility for what is going on is that she is probably dealing with a gout flare. She is not sure if she ever had gout before but nonetheless I think that is most probable based on what we see. I think that initiation of Colcrys would probably be appropriate for short-term. I do not believe that the 3 doses are going to be a problem. That is as far as interactions with any other medications are concerned. Obviously if she was on this for a long time that would be completely different. Electronic Signature(s) Signed: 01/09/2021 4:35:31 PM By: Worthy Keeler PA-C Entered By: Worthy Keeler on 01/09/2021 16:35:30 [name] [name] ([id number]) -------------------------------------------------------------------------------- Physician Orders Details Patient Name: [name] [name] Date of Service: 01/09/2021 10:15 AM Medical Record Number: [id number] Patient Account Number:  [id number] Date of Birth/Sex: Jun 23, 1927 (87 y.o. F) Treating RN: Cornell Barman Primary Care Provider: Tracie Harrier Other Clinician: Jeanine Luz Referring Provider: Tracie Harrier Treating Provider/Extender: Skipper Cliche in Treatment: 5 Verbal / Phone Orders: No Diagnosis Coding ICD-10 Coding Code Description [phone] Chronic venous hypertension (idiopathic) with ulcer and inflammation of bilateral lower extremity L97.221 Non-pressure chronic ulcer of left calf limited to breakdown of skin L97.211 Non-pressure chronic ulcer of right calf limited to breakdown of skin M10.071 Idiopathic gout, right ankle and foot Follow-up Appointments o Return Appointment in 1 week. Bathing/ Shower/ Hygiene o May shower with wound dressing protected with water repellent cover or cast protector. Edema Control - Lymphedema / Segmental Compressive Device / Other Bilateral Lower Extremities o Optional: One layer of unna paste to top of compression wrap (to act as an anchor). o Elevate, Exercise Daily and Avoid Standing for Long Periods of Time. o Elevate legs to the level of the heart and pump ankles as often as possible o Elevate leg(s) parallel to the floor when sitting. Additional Orders / Instructions o Follow Nutritious Diet and Increase Protein Intake Wound Treatment Wound #1 - Lower Leg Wound Laterality: Right, Posterior, Proximal Cleanser: Soap and Water 1 x Per Week/15 Days Discharge Instructions: Gently cleanse wound with antibacterial soap, rinse and pat dry prior to dressing wounds Primary Dressing: Cutimed Sorbact 1.5x 2.38 (in/in) 1 x Per Week/15 Days Discharge Instructions: Moisten with hydrogel-A bacteria- and fungi binding wound dressing, suitable for cavities and fistulas. It is suitable as a wound filler and allows the passage of wound exudate into a secondary dressing. The dressing helps reducing odor and pain and can improve healing. Secondary Dressing:  Xtrasorb Medium 4x5 (in/in) 1 x Per Week/15 Days Discharge Instructions: Apply to wound as directed.  Do not cut. Compression Wrap: Profore Lite LF 3 Multilayer Compression Bandaging System 1 x Per Week/15 Days Discharge Instructions: Apply 3 multi-layer wrap as prescribed. Wound #2 - Lower Leg Wound Laterality: Right, Posterior, Distal Cleanser: Soap and Water 1 x Per Week/15 Days Discharge Instructions: Gently cleanse wound with antibacterial soap, rinse and pat dry prior to dressing wounds Primary Dressing: Cutimed Sorbact 1.5x 2.38 (in/in) 1 x Per Week/15 Days Discharge Instructions: A bacteria- and fungi binding wound dressing, suitable for cavities and fistulas. It is suitable as a wound filler and allows the passage of wound exudate into a secondary dressing. The dressing helps reducing odor and pain and can improve healing. Secondary Dressing: Xtrasorb Medium 4x5 (in/in) 1 x Per Week/15 Days Discharge Instructions: Apply to wound as directed. Do not cut. Compression Wrap: Profore Lite LF 3 Multilayer Compression Bandaging System 1 x Per Week/15 Days Discharge Instructions: Apply 3 multi-layer wrap as prescribed. [name] [name], [name] [name] ([id number]) Patient Medications Allergies: penicillin, amoxicillin, Beta-Blockers (Beta-Adrenergic Blocking Agts), meloxicam, naproxen, NSAIDS (Non-Steroidal Anti-Inflammatory Drug) Notifications Medication Indication Start End Colcrys 01/09/2021 DOSE oral 0.6 mg tablet - 2 tablet oral taken by mouth x 1 dose then take 1 tablet by mouth 1 hour later x 1 dose. That completes treatment. Electronic Signature(s) Signed: 01/09/2021 4:38:59 PM By: Worthy Keeler PA-C Entered By: Worthy Keeler on 01/09/2021 16:38:58 [name] [name] ([id number]) -------------------------------------------------------------------------------- Problem [name] [name] Details Patient Name: [name] [name] Date of Service: 01/09/2021 10:15 AM Medical Record Number: [id number] Patient Account  Number: [id number] Date of Birth/Sex: 1927/02/25 (87 y.o. F) Treating RN: Cornell Barman Primary Care Provider: Tracie Harrier Other Clinician: Jeanine Luz Referring Provider: Tracie Harrier Treating Provider/Extender: Skipper Cliche in Treatment: 5 Active Problems ICD-10 Encounter Code Description Active Date MDM Diagnosis I87.333 Chronic venous hypertension (idiopathic) with ulcer and inflammation of 12/05/2020 No Yes bilateral lower extremity L97.221 Non-pressure chronic ulcer of left calf limited to breakdown of skin 12/05/2020 No Yes L97.211 Non-pressure chronic ulcer of right calf limited to breakdown of skin 12/05/2020 No Yes M10.071 Idiopathic gout, right ankle and foot 01/09/2021 No Yes Inactive Problems Resolved Problems Electronic Signature(s) Signed: 01/09/2021 3:59:57 PM By: Worthy Keeler PA-C Entered By: Worthy Keeler on 01/09/2021 15:59:57 [name] [name] ([id number]) -------------------------------------------------------------------------------- Progress Note Details Patient Name: [name] [name] Date of Service: 01/09/2021 10:15 AM Medical Record Number: [id number] Patient Account Number: [id number] Date of Birth/Sex: May 27, 1927 (87 y.o. F) Treating RN: Cornell Barman Primary Care Provider: Tracie Harrier Other Clinician: Jeanine Luz Referring Provider: Tracie Harrier Treating Provider/Extender: Skipper Cliche in Treatment: 5 Subjective Chief Complaint Information obtained from Patient 12/05/2020; patient is here for review of 2 small wounds on the right posterior calf and 1 on the left posterior calf History of Present Illness (HPI) ADMISSION 12/05/2020 This is a 87 year old woman who arrived accompanied by her daughter from [place]. She has been dealing with wounds on her bilateral posterior calf since January. There is no obvious antecedent injury. Her daughter tells me that she is actually had wounds that have opened and closed on her  legs since last summer. She currently she has 2 wounds on the right posterior calf and 1 on the left. They have been using Vaseline on the wounds and covering the area with kerlix but they have not been healing. The patient has been wearing what appears to be support stockings. Past medical history includes paroxysmal A. fib, pacemaker,, hypertension, PVD, COPD, left mastectomy chronic lower extremity swelling ABIs were  noncompressible bilateral 3/23; 1 week follow-up. Bilateral venous insufficiency ulcers right greater than left. As opposed to what I was worried about last week she actually tolerated 3 layer compression. We used Hydrofera Blue 3/30; bilateral venous insufficiency ulcers right greater than left. The area on the left is healed this week still 2 areas on the posterior right calf that are both measuring smaller. We have been using Hydrofera Blue under 3 layer compression she is. She seems to be tolerating this 4/6; initially with bilateral venous insufficiency ulcers although the area on the left is healed. She has a juxta lite stocking here which was properly applied by her daughter. She still has 2 areas on the posterior calf. These have been difficult in terms of maintaining a surface we have been using Hydrofera Blue under 3 layer compression 4/13; patient initially came in with bilateral posterior calf venous insufficiency wounds. The area on the left are healed. She has a juxta lite stocking that she is using. She still has the 2 areas on the posterior calf. She said the pain in the larger area was so bad she took her wrap off 2 days ago. We have been using Hydrofera Blue 01/09/21 upon evaluation today patient appears to be doing well in regards to her wound. I did not see that per se today I saw the picture but she actually had to come back and do the fact that her toes were somewhat abnormally discolored and the nurse was concerned about it. The patient was kind enough to come  back in later in the afternoon for me to have a look. The good news is that this does not appear to be as discolored but I am concerned actually about the possibility of gout based on what I see. Objective Constitutional Well-nourished and well-hydrated in no acute distress. Respiratory normal breathing without difficulty. Psychiatric this patient is able to make decisions and demonstrates good insight into disease process. Alert and Oriented x 3. pleasant and cooperative. General Notes: The patient does not appear to show any signs of infection right now which is great news and I am very pleased in that regard. With that being said I think that the biggest issue right now or the biggest possibility for what is going on is that she is probably dealing with a gout flare. She is not sure if she ever had gout before but nonetheless I think that is most probable based on what we see. I think that initiation of Colcrys would probably be appropriate for short-term. I do not believe that the 3 doses are going to be a problem. That is as far as interactions with any other medications are concerned. Obviously if she was on this for a long time that would be completely different. Integumentary (Hair, Skin) Wound #1 status is Open. Original cause of wound was Gradually Appeared. The date acquired was: 09/30/2020. The wound has been in treatment 5 weeks. The wound is located on the Right,Proximal,Posterior Lower Leg. The wound measures 1.5cm length x 0.7cm width x 0.4cm depth; [name] [name], [name] [name]. ([id number]) 0.825cm^2 area and 0.33cm^3 volume. There is Fat Layer (Subcutaneous Tissue) exposed. There is a medium amount of serosanguineous drainage noted. There is medium (34-66%) red granulation within the wound bed. There is a small (1-33%) amount of necrotic tissue within the wound bed including Adherent Slough. Wound #2 status is Open. Original cause of wound was Gradually Appeared. The date acquired was:  09/30/2020. The wound has been in treatment 5  weeks. The wound is located on the Right,Distal,Posterior Lower Leg. The wound measures 1.2cm length x 0.5cm width x 0.3cm depth; 0.471cm^2 area and 0.141cm^3 volume. Assessment Active Problems ICD-10 Chronic venous hypertension (idiopathic) with ulcer and inflammation of bilateral lower extremity Non-pressure chronic ulcer of left calf limited to breakdown of skin Non-pressure chronic ulcer of right calf limited to breakdown of skin Idiopathic gout, right ankle and foot Procedures Wound #2 Pre-procedure diagnosis of Wound #2 is a Venous Leg Ulcer located on the Right,Distal,Posterior Lower Leg . There was a Three Layer Compression Therapy Procedure by [name] Poag, RN. Plan Follow-up Appointments: Return Appointment in 1 week. Bathing/ Shower/ Hygiene: May shower with wound dressing protected with water repellent cover or cast protector. Edema Control - Lymphedema / Segmental Compressive Device / Other: Optional: One layer of unna paste to top of compression wrap (to act as an anchor). Elevate, Exercise Daily and Avoid Standing for Long Periods of Time. Elevate legs to the level of the heart and pump ankles as often as possible Elevate leg(s) parallel to the floor when sitting. Additional Orders / Instructions: Follow Nutritious Diet and Increase Protein Intake The following medication(s) was prescribed: Colcrys oral 0.6 mg tablet 2 tablet oral taken by mouth x 1 dose then take 1 tablet by mouth 1 hour later x 1 dose. That completes treatment. starting 01/09/2021 WOUND #1: - Lower Leg Wound Laterality: Right, Posterior, Proximal Cleanser: Soap and Water 1 x Per Week/15 Days Discharge Instructions: Gently cleanse wound with antibacterial soap, rinse and pat dry prior to dressing wounds Primary Dressing: Cutimed Sorbact 1.5x 2.38 (in/in) 1 x Per Week/15 Days Discharge Instructions: Moisten with hydrogel-A bacteria- and fungi binding wound  dressing, suitable for cavities and fistulas. It is suitable as a wound filler and allows the passage of wound exudate into a secondary dressing. The dressing helps reducing odor and pain and can improve healing. Secondary Dressing: Xtrasorb Medium 4x5 (in/in) 1 x Per Week/15 Days Discharge Instructions: Apply to wound as directed. Do not cut. Compression Wrap: Profore Lite LF 3 Multilayer Compression Bandaging System 1 x Per Week/15 Days Discharge Instructions: Apply 3 multi-layer wrap as prescribed. WOUND #2: - Lower Leg Wound Laterality: Right, Posterior, Distal Cleanser: Soap and Water 1 x Per Week/15 Days Discharge Instructions: Gently cleanse wound with antibacterial soap, rinse and pat dry prior to dressing wounds Primary Dressing: Cutimed Sorbact 1.5x 2.38 (in/in) 1 x Per Week/15 Days Discharge Instructions: A bacteria- and fungi binding wound dressing, suitable for cavities and fistulas. It is suitable as a wound filler and allows the passage of wound exudate into a secondary dressing. The dressing helps reducing odor and pain and can improve healing. Secondary Dressing: Xtrasorb Medium 4x5 (in/in) 1 x Per Week/15 Days Discharge Instructions: Apply to wound as directed. Do not cut. Compression Wrap: Profore Lite LF 3 Multilayer Compression Bandaging System 1 x Per Week/15 Days Discharge Instructions: Apply 3 multi-layer wrap as prescribed. [name] [name], [name] BMarland Kitchen ([id number]) 1. I would recommend currently that we go ahead and continue with the wound care measures as before specifically with regard to the Sorbact I think that is doing a great job for her. 2. Would recommend as well that we continue with the XtraSorb to cover which I think is doing well as well. 3. I am also can recommend that the patient continue with a 3 layer compression wrap which is doing a great job 4. 4. I am getting go ahead and give her a prescription for Colcrys that  will be 2 pills taken x1 dose then 1 pill 1 hour  later. That we will complete treatment. I think she likely has a gout flare causing the pain I do not believe this is an infection. We will see patient back for reevaluation in 1 week here in the clinic. If anything worsens or changes patient will contact our office for additional recommendations. Electronic Signature(s) Signed: 01/09/2021 4:40:21 PM By: Worthy Keeler PA-C Entered By: Worthy Keeler on 01/09/2021 16:40:21 [name] [name] ([id number]) -------------------------------------------------------------------------------- SuperBill Details Patient Name: [name] [name] Date of Service: 01/09/2021 Medical Record Number: [id number] Patient Account Number: [id number] Date of Birth/Sex: Jan 09, 1927 (87 y.o. F) Treating RN: [name] [name] Primary Care Provider: Tracie Harrier Other Clinician: Jeanine Luz Referring Provider: Tracie Harrier Treating Provider/Extender: Skipper Cliche in Treatment: 5 Diagnosis Coding ICD-10 Codes Code Description [phone] Chronic venous hypertension (idiopathic) with ulcer and inflammation of bilateral lower extremity L97.221 Non-pressure chronic ulcer of left calf limited to breakdown of skin L97.211 Non-pressure chronic ulcer of right calf limited to breakdown of skin Facility Procedures CPT4 Code: [id number] Description: (Facility Use Only) Wallace LWR RT LEG Modifier: Quantity: 1 Physician Procedures CPT4 Code Description: [id number] - WC PHYS LEVEL 4 - EST PT Modifier: Quantity: 1 CPT4 Code Description: ICD-10 Diagnosis Description I87.333 Chronic venous hypertension (idiopathic) with ulcer and inflammation of L97.221 Non-pressure chronic ulcer of left calf limited to breakdown of skin L97.211 Non-pressure chronic ulcer of right  calf limited to breakdown of skin Modifier: bilateral lower extre Quantity: mity Electronic Signature(s) Signed: 01/09/2021 4:41:46 PM By: Worthy Keeler PA-C Previous Signature:  01/09/2021 12:58:56 PM Version By: Worthy Keeler PA-C Previous Signature: 01/09/2021 4:31:23 PM Version By: [name] [name] Entered By: Worthy Keeler on 01/09/2021 16:41:46

## 2021-01-11 NOTE — Progress Notes (Signed)
[name], [name] ([id number]) Visit Report for 01/09/2021 Arrival Information Details Patient Name: [name] [name], [name] [name]. Date of Service: 01/09/2021 10:15 AM Medical Record Number: [id number] Patient Account Number: [id number] Date of Birth/Sex: 1926-10-07 (87 y.o. F) Treating RN: Donnamarie Poag Primary Care General Wearing: Tracie Harrier Other Clinician: Jeanine Luz Referring Roosevelt Bisher: Tracie Harrier Treating Alveena Taira/Extender: Skipper Cliche in Treatment: 5 Visit Information History Since Last Visit Added or deleted any medications: No Patient Arrived: Gilford Rile Had a fall or experienced change in No Arrival Time: 10:05 activities of daily living that may affect Accompanied By: daughter risk of falls: Transfer Assistance: None Hospitalized since last visit: No Patient Identification Verified: Yes Has Dressing in Place as Prescribed: Yes Secondary Verification Process Completed: Yes Has Compression in Place as Prescribed: Yes Patient Requires Transmission-Based No Pain Present Now: Yes Precautions: Patient Has Alerts: Yes Patient Alerts: Patient on Blood Thinner warfarin NON COMPRESSIBLE L and R NOT DIABETIC Electronic Signature(s) Signed: 01/10/2021 5:57:05 PM By: Gretta Cool, BSN, RN, CWS, Kim RN, BSN Entered By: Gretta Cool, BSN, RN, CWS, Kim on 01/09/2021 15:54:44 [name] [name] ([id number]) -------------------------------------------------------------------------------- Clinic Level of Care Assessment Details Patient Name: [name] [name] Date of Service: 01/09/2021 10:15 AM Medical Record Number: [id number] Patient Account Number: [id number] Date of Birth/Sex: Apr 15, 1927 (87 y.o. F) Treating RN: Donnamarie Poag Primary Care Woodrow Drab: Tracie Harrier Other Clinician: Jeanine Luz Referring Shanon Becvar: Tracie Harrier Treating Raylen Ken/Extender: Skipper Cliche in Treatment: 5 Clinic Level of Care Assessment Items TOOL 1 Quantity Score []  - Use when EandM and Procedure is  performed on INITIAL visit 0 ASSESSMENTS - Nursing Assessment / Reassessment []  - General Physical Exam (combine w/ comprehensive assessment (listed just below) when performed on new 0 pt. evals) []  - 0 Comprehensive Assessment (HX, ROS, Risk Assessments, Wounds Hx, etc.) ASSESSMENTS - Wound and Skin Assessment / Reassessment []  - Dermatologic / Skin Assessment (not related to wound area) 0 ASSESSMENTS - Ostomy and/or Continence Assessment and Care []  - Incontinence Assessment and Management 0 []  - 0 Ostomy Care Assessment and Management (repouching, etc.) PROCESS - Coordination of Care []  - Simple Patient / Family Education for ongoing care 0 []  - 0 Complex (extensive) Patient / Family Education for ongoing care []  - 0 Staff obtains Programmer, systems, Records, Test Results / Process Orders []  - 0 Staff telephones HHA, Nursing Homes / Clarify orders / etc []  - 0 Routine Transfer to another Facility (non-emergent condition) []  - 0 Routine Hospital Admission (non-emergent condition) []  - 0 New Admissions / Biomedical engineer / Ordering NPWT, Apligraf, etc. []  - 0 Emergency Hospital Admission (emergent condition) PROCESS - Special Needs []  - Pediatric / Minor Patient Management 0 []  - 0 Isolation Patient Management []  - 0 Hearing / Language / Visual special needs []  - 0 Assessment of Community assistance (transportation, D/C planning, etc.) []  - 0 Additional assistance / Altered mentation []  - 0 Support Surface(s) Assessment (bed, cushion, seat, etc.) INTERVENTIONS - Miscellaneous []  - External ear exam 0 []  - 0 Patient Transfer (multiple staff / Civil Service fast streamer / Similar devices) []  - 0 Simple Staple / Suture removal (25 or less) []  - 0 Complex Staple / Suture removal (26 or more) []  - 0 Hypo/Hyperglycemic Management (do not check if billed separately) []  - 0 Ankle / Brachial Index (ABI) - do not check if billed separately Has the patient been seen at the hospital within  the last three years: Yes Total Score: 0 Level Of Care: ____ [name] [name] ([id number]) Electronic Signature(s) Signed: 01/09/2021  4:31:23 PM By: Donnamarie Poag Entered ByDonnamarie Poag on 01/09/2021 10:30:09 [name] [name] ([id number]) -------------------------------------------------------------------------------- Compression Therapy Details Patient Name: [name] [name] Date of Service: 01/09/2021 10:15 AM Medical Record Number: [id number] Patient Account Number: [id number] Date of Birth/Sex: June 26, 1927 (87 y.o. F) Treating RN: Donnamarie Poag Primary Care Chestina Komatsu: Tracie Harrier Other Clinician: Jeanine Luz Referring Lenzy Kerschner: Tracie Harrier Treating Trendon Zaring/Extender: Skipper Cliche in Treatment: 5 Compression Therapy Performed for Wound Assessment: Wound #2 Right,Distal,Posterior Lower Leg Performed By: Clinician Donnamarie Poag, RN Compression Type: Three Layer Electronic Signature(s) Signed: 01/09/2021 4:31:23 PM By: Donnamarie Poag Entered By: Donnamarie Poag on 01/09/2021 10:28:50 [name] [name] ([id number]) -------------------------------------------------------------------------------- Encounter Discharge Information Details Patient Name: [name] [name] Date of Service: 01/09/2021 10:15 AM Medical Record Number: [id number] Patient Account Number: [id number] Date of Birth/Sex: [birth year] (87 y.o. F) Treating RN: Donnamarie Poag Primary Care Neda Willenbring: Tracie Harrier Other Clinician: Jeanine Luz Referring Zeta Bucy: Tracie Harrier Treating Kolbey Teichert/Extender: Skipper Cliche in Treatment: 5 Encounter Discharge Information Items Discharge Condition: Stable Ambulatory Status: Walker Discharge Destination: Home Transportation: Private Auto Accompanied By: daughter Schedule Follow-up Appointment: Yes Clinical Summary of Care: Notes c/o gout s/s right toes---no s/s cellulitis on leg/skin intact to toes Electronic Signature(s) Signed: 01/09/2021 4:31:23 PM By: Donnamarie Poag Entered By: Donnamarie Poag on 01/09/2021 10:29:58 [name] [name] ([id number]) -------------------------------------------------------------------------------- Wound Assessment Details Patient Name: Early Osmond B. Date of Service: 01/09/2021 10:15 AM Medical Record Number: [id number] Patient Account Number: [id number] Date of Birth/Sex: 14-May-1927 (87 y.o. F) Treating RN: Donnamarie Poag Primary Care Verl Whitmore: Tracie Harrier Other Clinician: Jeanine Luz Referring Timoth Schara: Tracie Harrier Treating Tangee Marszalek/Extender: Skipper Cliche in Treatment: 5 Wound Status Wound Number: 1 Primary Venous Leg Ulcer Etiology: Wound Location: Right, Proximal, Posterior Lower Leg Wound Open Wounding Event: Gradually Appeared Status: Date Acquired: 09/30/2020 Comorbid Chronic Obstructive Pulmonary Disease (COPD), Weeks Of Treatment: 5 History: Arrhythmia, Congestive Heart Failure, Hypertension, Clustered Wound: No Received Chemotherapy Wound Measurements Length: (cm) 1.5 Width: (cm) 0.7 Depth: (cm) 0.4 Area: (cm) 0.825 Volume: (cm) 0.33 % Reduction in Area: 62.5% % Reduction in Volume: -50% Epithelialization: None Wound Description Classification: Full Thickness Without Exposed Support Structures Exudate Amount: Medium Exudate Type: Serosanguineous Exudate Color: red, brown Foul Odor After Cleansing: No Slough/Fibrino Yes Wound Bed Granulation Amount: Medium (34-66%) Exposed Structure Granulation Quality: Red Fascia Exposed: No Necrotic Amount: Small (1-33%) Fat Layer (Subcutaneous Tissue) Exposed: Yes Necrotic Quality: Adherent Slough Tendon Exposed: No Muscle Exposed: No Joint Exposed: No Bone Exposed: No Treatment Notes Wound #1 (Lower Leg) Wound Laterality: Right, Posterior, Proximal Cleanser Soap and Water Discharge Instruction: Gently cleanse wound with antibacterial soap, rinse and pat dry prior to dressing wounds Peri-Wound Care Topical Primary  Dressing Cutimed Sorbact 1.5x 2.38 (in/in) Discharge Instruction: Moisten with hydrogel-A bacteria- and fungi binding wound dressing, suitable for cavities and fistulas. It is suitable as a wound filler and allows the passage of wound exudate into a secondary dressing. The dressing helps reducing odor and pain and can improve healing. Secondary Dressing Xtrasorb Medium 4x5 (in/in) Discharge Instruction: Apply to wound as directed. Do not cut. Secured With [employer], Sophina B. ([id number]) Profore Lite LF 3 Multilayer Compression Bandaging System Discharge Instruction: Apply 3 multi-layer wrap as prescribed. Compression Stockings Add-Ons Electronic Signature(s) Signed: 01/09/2021 4:31:23 PM By: Donnamarie Poag Entered By: Donnamarie Poag on 01/09/2021 10:28:17 [name] [name] ([id number]) -------------------------------------------------------------------------------- Wound Assessment Details Patient Name: [name] [name] Date of Service: 01/09/2021 10:15 AM Medical Record Number: [id number] Patient Account Number: [id number]  Date of Birth/Sex: 1927-06-26 (87 y.o. F) Treating RN: Donnamarie Poag Primary Care Gordy Goar: Tracie Harrier Other Clinician: Jeanine Luz Referring Olvin Rohr: Tracie Harrier Treating Jaqueline Uber/Extender: Skipper Cliche in Treatment: 5 Wound Status Wound Number: 2 Primary Etiology: Venous Leg Ulcer Wound Location: Right, Distal, Posterior Lower Leg Wound Status: Open Wounding Event: Gradually Appeared Date Acquired: 09/30/2020 Weeks Of Treatment: 5 Clustered Wound: No Wound Measurements Length: (cm) 1.2 Width: (cm) 0.5 Depth: (cm) 0.3 Area: (cm) 0.471 Volume: (cm) 0.141 % Reduction in Area: 14.4% % Reduction in Volume: -156.4% Wound Description Classification: Full Thickness Without Exposed Support Structure s Treatment Notes Wound #2 (Lower Leg) Wound Laterality: Right, Posterior, Distal Cleanser Soap and Water Discharge Instruction:  Gently cleanse wound with antibacterial soap, rinse and pat dry prior to dressing wounds Peri-Wound Care Topical Primary Dressing Cutimed Sorbact 1.5x 2.38 (in/in) Discharge Instruction: A bacteria- and fungi binding wound dressing, suitable for cavities and fistulas. It is suitable as a wound filler and allows the passage of wound exudate into a secondary dressing. The dressing helps reducing odor and pain and can improve healing. Secondary Dressing Xtrasorb Medium 4x5 (in/in) Discharge Instruction: Apply to wound as directed. Do not cut. Secured With Compression Wrap Profore Lite LF 3 Multilayer Compression Bandaging System Discharge Instruction: Apply 3 multi-layer wrap as prescribed. Compression Stockings Add-Ons Electronic Signature(s) Signed: 01/09/2021 4:31:23 PM By: Donnamarie Poag Entered ByDonnamarie Poag on 01/09/2021 10:26:43

## 2021-01-16 ENCOUNTER — Other Ambulatory Visit: Payer: Self-pay

## 2021-01-16 ENCOUNTER — Encounter: Payer: PPO | Admitting: Internal Medicine

## 2021-01-16 DIAGNOSIS — I87333 Chronic venous hypertension (idiopathic) with ulcer and inflammation of bilateral lower extremity: Secondary | ICD-10-CM | POA: Diagnosis not present

## 2021-01-16 DIAGNOSIS — L97212 Non-pressure chronic ulcer of right calf with fat layer exposed: Secondary | ICD-10-CM | POA: Diagnosis not present

## 2021-01-16 DIAGNOSIS — I872 Venous insufficiency (chronic) (peripheral): Secondary | ICD-10-CM | POA: Diagnosis not present

## 2021-01-18 NOTE — Progress Notes (Signed)
BURNETTE, ANTONE ([id number]) Visit Report for 01/16/2021 Arrival Information Details Patient Name: [name], [name]. Date of Service: 01/16/2021 3:00 PM Medical Record Number: [id number] Patient Account Number: [id number] Date of Birth/Sex: Dec 28, 1926 (87 y.o. F) Treating RN: Donnamarie Poag Primary Care Ignatius Kloos: Tracie Harrier Other Clinician: Jeanine Luz Referring Myka Lukins: Tracie Harrier Treating Elridge Stemm/Extender: Tito Dine in Treatment: 6 Visit Information History Since Last Visit Added or deleted any medications: No Patient Arrived: [name] [name] Had a fall or experienced change in No Arrival Time: 15:04 activities of daily living that may affect Accompanied By: daughter risk of falls: Transfer Assistance: None Hospitalized since last visit: No Patient Identification Verified: Yes Has Dressing in Place as Prescribed: Yes Secondary Verification Process Completed: Yes Pain Present Now: No Patient Requires Transmission-Based No Precautions: Patient Has Alerts: Yes Patient Alerts: Patient on Blood Thinner warfarin NON COMPRESSIBLE L and R NOT DIABETIC Electronic Signature(s) Signed: 01/16/2021 4:46:55 PM By: Donnamarie Poag Entered By: Donnamarie Poag on 01/16/2021 15:06:55 [name] [name] ([id number]) -------------------------------------------------------------------------------- Clinic Level of Care Assessment Details Patient Name: [name] [name] Date of Service: 01/16/2021 3:00 PM Medical Record Number: [id number] Patient Account Number: [id number] Date of Birth/Sex: [birth year] (87 y.o. F) Treating RN: Dolan Amen Primary Care Kayliee Atienza: Tracie Harrier Other Clinician: Jeanine Luz Referring Kennie Karapetian: Tracie Harrier Treating Zadia Uhde/Extender: Tito Dine in Treatment: 6 Clinic Level of Care Assessment Items TOOL 1 Quantity Score []  - Use when EandM and Procedure is performed on INITIAL visit 0 ASSESSMENTS - Nursing Assessment /  Reassessment []  - General Physical Exam (combine w/ comprehensive assessment (listed just below) when performed on new 0 pt. evals) []  - 0 Comprehensive Assessment (HX, ROS, Risk Assessments, Wounds Hx, etc.) ASSESSMENTS - Wound and Skin Assessment / Reassessment []  - Dermatologic / Skin Assessment (not related to wound area) 0 ASSESSMENTS - Ostomy and/or Continence Assessment and Care []  - Incontinence Assessment and Management 0 []  - 0 Ostomy Care Assessment and Management (repouching, etc.) PROCESS - Coordination of Care []  - Simple Patient / Family Education for ongoing care 0 []  - 0 Complex (extensive) Patient / Family Education for ongoing care []  - 0 Staff obtains Programmer, systems, Records, Test Results / Process Orders []  - 0 Staff telephones HHA, Nursing Homes / Clarify orders / etc []  - 0 Routine Transfer to another Facility (non-emergent condition) []  - 0 Routine Hospital Admission (non-emergent condition) []  - 0 New Admissions / Biomedical engineer / Ordering NPWT, Apligraf, etc. []  - 0 Emergency Hospital Admission (emergent condition) PROCESS - Special Needs []  - Pediatric / Minor Patient Management 0 []  - 0 Isolation Patient Management []  - 0 Hearing / Language / Visual special needs []  - 0 Assessment of Community assistance (transportation, D/C planning, etc.) []  - 0 Additional assistance / Altered mentation []  - 0 Support Surface(s) Assessment (bed, cushion, seat, etc.) INTERVENTIONS - Miscellaneous []  - External ear exam 0 []  - 0 Patient Transfer (multiple staff / Civil Service fast streamer / Similar devices) []  - 0 Simple Staple / Suture removal (25 or less) []  - 0 Complex Staple / Suture removal (26 or more) []  - 0 Hypo/Hyperglycemic Management (do not check if billed separately) []  - 0 Ankle / Brachial Index (ABI) - do not check if billed separately Has the patient been seen at the hospital within the last three years: Yes Total Score: 0 Level Of Care:  ____ [name] [name] ([id number]) Electronic Signature(s) Signed: 01/16/2021 4:49:58 PM By: Georges Mouse, Minus Breeding RN Entered By: Georges Mouse, Minus Breeding on  01/16/2021 16:03:26 [name], [name] ([id number]) -------------------------------------------------------------------------------- Compression Therapy Details Patient Name: [name], [name]. Date of Service: 01/16/2021 3:00 PM Medical Record Number: [id number] Patient Account Number: [id number] Date of Birth/Sex: May 19, 1927 (87 y.o. F) Treating RN: Dolan Amen Primary Care Khambrel Amsden: Tracie Harrier Other Clinician: Jeanine Luz Referring Jaikob Borgwardt: Tracie Harrier Treating Mauri Tolen/Extender: Tito Dine in Treatment: 6 Compression Therapy Performed for Wound Assessment: Wound #1 Right,Proximal,Posterior Lower Leg Performed By: Cora Daniels, RN Compression Type: Three Layer Post Procedure Diagnosis Same as Pre-procedure Notes pt tolerating wrap well Electronic Signature(s) Signed: 01/16/2021 4:49:58 PM By: Georges Mouse, Minus Breeding RN Entered By: Georges Mouse, Minus Breeding on 01/16/2021 16:02:45 [name] [name] ([id number]) -------------------------------------------------------------------------------- Compression Therapy Details Patient Name: [name] [name] Date of Service: 01/16/2021 3:00 PM Medical Record Number: [id number] Patient Account Number: [id number] Date of Birth/Sex: 02-09-1927 (87 y.o. F) Treating RN: Dolan Amen Primary Care Pershing Skidmore: Tracie Harrier Other Clinician: Jeanine Luz Referring Donel Osowski: Tracie Harrier Treating Izaah Westman/Extender: Tito Dine in Treatment: 6 Compression Therapy Performed for Wound Assessment: Wound #2 Right,Distal,Posterior Lower Leg Performed By: Cora Daniels, RN Compression Type: Three Layer Post Procedure Diagnosis Same as Pre-procedure Notes pt tolerating wrap well Electronic Signature(s) Signed: 01/16/2021 4:49:58 PM  By: Georges Mouse, Minus Breeding RN Entered By: Georges Mouse, Minus Breeding on 01/16/2021 16:02:45 [name] [name] ([id number]) -------------------------------------------------------------------------------- Encounter Discharge Information Details Patient Name: [name] [name] Date of Service: 01/16/2021 3:00 PM Medical Record Number: [id number] Patient Account Number: [id number] Date of Birth/Sex: 1927/07/23 (87 y.o. F) Treating RN: Carlene Coria Primary Care Yanitza Shvartsman: Tracie Harrier Other Clinician: Jeanine Luz Referring Jye Fariss: Tracie Harrier Treating Timmy Bubeck/Extender: Tito Dine in Treatment: 6 Encounter Discharge Information Items Post Procedure Vitals Discharge Condition: Stable Temperature (F): 97.9 Ambulatory Status: Walker Pulse (bpm): 75 Discharge Destination: Home Respiratory Rate (breaths/min): 18 Transportation: Private Auto Blood Pressure (mmHg): 100/65 Accompanied By: daughter Schedule Follow-up Appointment: Yes Clinical Summary of Care: Electronic Signature(s) Signed: 01/18/2021 8:24:32 AM By: Carlene Coria RN Entered By: Carlene Coria on 01/16/2021 16:23:00 [name] [name] ([id number]) -------------------------------------------------------------------------------- Lower Extremity Assessment Details Patient Name: [name] [name] Date of Service: 01/16/2021 3:00 PM Medical Record Number: [id number] Patient Account Number: [id number] Date of Birth/Sex: 1927-07-15 (87 y.o. F) Treating RN: Donnamarie Poag Primary Care Rakayla Ricklefs: Tracie Harrier Other Clinician: Jeanine Luz Referring Kennis Buell: Tracie Harrier Treating Devyn Sheerin/Extender: Tito Dine in Treatment: 6 Edema Assessment Assessed: [Left: No] [Right: Yes] [Left: Edema] [Right: :] Calf Left: Right: Point of Measurement: 31 cm From Medial Instep 32 cm Ankle Left: Right: Point of Measurement: 11 cm From Medial Instep 19.5 cm Knee To Floor Left: Right: From Medial Instep 39  cm Vascular Assessment Pulses: Dorsalis Pedis Palpable: [Right:Yes] Electronic Signature(s) Signed: 01/16/2021 4:46:55 PM By: Donnamarie Poag Entered By: Donnamarie Poag on 01/16/2021 15:19:43 [name] [name] ([id number]) -------------------------------------------------------------------------------- Multi Wound Chart Details Patient Name: [name] [name] Date of Service: 01/16/2021 3:00 PM Medical Record Number: [id number] Patient Account Number: [id number] Date of Birth/Sex: 28-May-1927 (87 y.o. F) Treating RN: Dolan Amen Primary Care Amara Manalang: Tracie Harrier Other Clinician: Jeanine Luz Referring Stefanee Mckell: Tracie Harrier Treating Detravion Tester/Extender: Tito Dine in Treatment: 6 Vital Signs Height(in): 63 Pulse(bpm): 75 Weight(lbs): 174 Blood Pressure(mmHg): 100/65 Body Mass Index(BMI): 31 Temperature(F): 97.9 Respiratory Rate(breaths/min): 18 Photos: [N/A:N/A] Wound Location: Right, Proximal, Posterior Lower Right, Distal, Posterior Lower Leg N/A Leg Wounding Event: Gradually Appeared Gradually Appeared N/A Primary Etiology: Venous Leg Ulcer Venous Leg Ulcer N/A Comorbid History: Chronic Obstructive Pulmonary Chronic Obstructive Pulmonary N/A  Disease (COPD), Arrhythmia, Disease (COPD), Arrhythmia, Congestive Heart Failure, Congestive Heart Failure, Hypertension, Received Hypertension, Received Chemotherapy Chemotherapy Date Acquired: 09/30/2020 09/30/2020 N/A Weeks of Treatment: 6 6 N/A Wound Status: Open Open N/A Measurements L x W x D (cm) 1x0.5x0.3 0.3x0.2x0.1 N/A Area (cm) : 0.393 0.047 N/A Volume (cm) : 0.118 0.005 N/A % Reduction in Area: 82.10% 91.50% N/A % Reduction in Volume: 46.40% 90.90% N/A Classification: Full Thickness Without Exposed Full Thickness Without Exposed N/A Support Structures Support Structures Exudate Amount: Medium None Present N/A Exudate Type: Serosanguineous N/A N/A Exudate Color: red, brown N/A N/A Granulation Amount:  Medium (34-66%) N/A N/A Granulation Quality: Red N/A N/A Necrotic Amount: Medium (34-66%) N/A N/A Necrotic Tissue: Adherent Slough Eschar N/A Exposed Structures: Fat Layer (Subcutaneous Tissue): Fat Layer (Subcutaneous Tissue): N/A Yes Yes Fascia: No Fascia: No Tendon: No Tendon: No Muscle: No Muscle: No Joint: No Joint: No Bone: No Bone: No Epithelialization: Small (1-33%) N/A N/A Debridement: Debridement - Selective/Open Debridement - Selective/Open N/A Wound Wound Pre-procedure Verification/Time 16:00 16:00 N/A Out Taken: Tissue Debrided: [facility] N/A Level: Non-Viable Tissue Non-Viable Tissue N/A Debridement Area (sq cm): 0.5 0.09 N/A Instrument: Curette Curette N/A Bleeding: Minimum Minimum N/A [name], [name] ([id number]) Hemostasis Achieved: Pressure Pressure N/A Debridement Treatment Procedure was tolerated well Procedure was tolerated well N/A Response: Post Debridement 1x0.5x0.4 0.3x0.2x0.2 N/A Measurements L x W x D (cm) Post Debridement Volume: 0.157 0.009 N/A (cm) Procedures Performed: Compression Therapy Compression Therapy N/A Debridement Debridement Treatment Notes Wound #1 (Lower Leg) Wound Laterality: Right, Posterior, Proximal Cleanser Soap and Water Discharge Instruction: Gently cleanse wound with antibacterial soap, rinse and pat dry prior to dressing wounds Peri-Wound Care Topical Triamcinolone Acetonide Cream, 0.1%, 15 (g) tube Discharge Instruction: Apply as directed by Harlo Jaso. Primary Dressing Cutimed Sorbact 1.5x 2.38 (in/in) Discharge Instruction: Moisten with hydrogel-A bacteria- and fungi binding wound dressing, suitable for cavities and fistulas. It is suitable as a wound filler and allows the passage of wound exudate into a secondary dressing. The dressing helps reducing odor and pain and can improve healing. Secondary Dressing Xtrasorb Medium 4x5 (in/in) Discharge Instruction: Apply to wound as directed. Do not  cut. Secured With Compression Wrap Profore Lite LF 3 Multilayer Compression Bandaging System Discharge Instruction: Apply 3 multi-layer wrap as prescribed. Compression Stockings Add-Ons Wound #2 (Lower Leg) Wound Laterality: Right, Posterior, Distal Cleanser Soap and Water Discharge Instruction: Gently cleanse wound with antibacterial soap, rinse and pat dry prior to dressing wounds Peri-Wound Care Topical Triamcinolone Acetonide Cream, 0.1%, 15 (g) tube Discharge Instruction: Apply as directed by Laine Giovanetti. Primary Dressing Cutimed Sorbact 1.5x 2.38 (in/in) Discharge Instruction: A bacteria- and fungi binding wound dressing, suitable for cavities and fistulas. It is suitable as a wound filler and allows the passage of wound exudate into a secondary dressing. The dressing helps reducing odor and pain and can improve healing. Secondary Dressing Xtrasorb Medium 4x5 (in/in) Discharge Instruction: Apply to wound as directed. Do not cut. Secured With [place], Deborah Chalk ([id number]) Compression Wrap Profore Lite LF 3 Multilayer Compression Bandaging System Discharge Instruction: Apply 3 multi-layer wrap as prescribed. Compression Stockings Add-Ons Electronic Signature(s) Signed: 01/16/2021 5:04:28 PM By: Linton Ham MD Entered By: Linton Ham on 01/16/2021 16:48:38 [name] [name] ([id number]) -------------------------------------------------------------------------------- [name] [name] Details Patient Name: SANJNA, PERRA. Date of Service: 01/16/2021 3:00 PM Medical Record Number: [id number] Patient Account Number: [id number] Date of Birth/Sex: [birth year] (87 y.o. F) Treating RN: Dolan Amen Primary Care Staci Carver: Tracie Harrier Other Clinician: Jeanine Luz Referring  Deneshia Zucker: Tracie Harrier Treating Crimson Beer/Extender: Tito Dine in Treatment: 6 Active Inactive Necrotic Tissue Nursing Diagnoses: Impaired tissue integrity related to  necrotic/devitalized tissue Goals: Necrotic/devitalized tissue will be minimized in the wound bed Date Initiated: 12/05/2020 Target Resolution Date: 12/19/2020 Goal Status: Active Interventions: Assess patient pain level pre-, during and post procedure and prior to discharge Treatment Activities: Apply topical anesthetic as ordered : 12/05/2020 Notes: Venous Leg Ulcer Nursing Diagnoses: Actual venous Insuffiency (use after diagnosis is confirmed) Goals: Patient will maintain optimal edema control Date Initiated: 12/05/2020 Target Resolution Date: 12/19/2020 Goal Status: Active Interventions: Assess peripheral edema status every visit. Compression as ordered Provide education on venous insufficiency Treatment Activities: Therapeutic compression applied : 12/05/2020 Notes: Wound/Skin Impairment Nursing Diagnoses: Impaired tissue integrity Goals: Patient/caregiver will verbalize understanding of skin care regimen Date Initiated: 12/05/2020 Target Resolution Date: 12/05/2020 Goal Status: Active Ulcer/skin breakdown will have a volume reduction of 30% by week 4 Date Initiated: 12/05/2020 Target Resolution Date: 01/05/2021 Goal Status: Active Interventions: Assess patient/caregiver ability to perform ulcer/skin care regimen upon admission and as needed Assess ulceration(s) every visit [name], SCIONEAUX ([id number]) Treatment Activities: Skin care regimen initiated : 12/05/2020 Topical wound management initiated : 12/05/2020 Notes: Electronic Signature(s) Signed: 01/16/2021 4:49:58 PM By: Georges Mouse, Minus Breeding RN Entered By: Georges Mouse, Minus Breeding on 01/16/2021 15:59:33 [name] [name] ([id number]) -------------------------------------------------------------------------------- Pain Assessment Details Patient Name: [name] [name] Date of Service: 01/16/2021 3:00 PM Medical Record Number: [id number] Patient Account Number: [id number] Date of Birth/Sex: 12-23-1926 (87 y.o. F) Treating  RN: Donnamarie Poag Primary Care Serenna Deroy: Tracie Harrier Other Clinician: Jeanine Luz Referring Padraic Marinos: Tracie Harrier Treating Yahira Timberman/Extender: Tito Dine in Treatment: 6 Active Problems Location of Pain Severity and Description of Pain Patient Has Paino No Site Locations Rate the pain. Current Pain Level: 0 Pain Management and Medication Current Pain Management: Notes says very mild burning sensation in leg since wound Electronic Signature(s) Signed: 01/16/2021 4:46:55 PM By: Donnamarie Poag Entered By: Donnamarie Poag on 01/16/2021 15:07:48 [name] [name] ([id number]) -------------------------------------------------------------------------------- Patient/Caregiver Education Details Patient Name: [name] [name] Date of Service: 01/16/2021 3:00 PM Medical Record Number: [id number] Patient Account Number: [id number] Date of Birth/Gender: 1927/02/24 (87 y.o. F) Treating RN: Dolan Amen Primary Care Physician: Tracie Harrier Other Clinician: Jeanine Luz Referring Physician: Tracie Harrier Treating Physician/Extender: Tito Dine in Treatment: 6 Education Assessment Education Provided To: Patient Education Topics Provided Wound/Skin Impairment: Methods: Explain/Verbal Responses: State content correctly Electronic Signature(s) Signed: 01/16/2021 4:49:58 PM By: Georges Mouse, Minus Breeding RN Entered By: Georges Mouse, Minus Breeding on 01/16/2021 16:03:47 [name] [name] ([id number]) -------------------------------------------------------------------------------- Wound Assessment Details Patient Name: [name] [name] Date of Service: 01/16/2021 3:00 PM Medical Record Number: [id number] Patient Account Number: [id number] Date of Birth/Sex: 1927-02-03 (87 y.o. F) Treating RN: Donnamarie Poag Primary Care Viktorya Arguijo: Tracie Harrier Other Clinician: Jeanine Luz Referring Jaecob Lowden: Tracie Harrier Treating Judee Hennick/Extender: Tito Dine in Treatment: 6 Wound Status Wound Number: 1 Primary Venous Leg Ulcer Etiology: Wound Location: Right, Proximal, Posterior Lower Leg Wound Open Wounding Event: Gradually Appeared Status: Date Acquired: 09/30/2020 Comorbid Chronic Obstructive Pulmonary Disease (COPD), Weeks Of Treatment: 6 History: Arrhythmia, Congestive Heart Failure, Hypertension, Clustered Wound: No Received Chemotherapy Photos Wound Measurements Length: (cm) 1 Width: (cm) 0.5 Depth: (cm) 0.3 Area: (cm) 0.393 Volume: (cm) 0.118 % Reduction in Area: 82.1% % Reduction in Volume: 46.4% Epithelialization: Small (1-33%) Tunneling: No Undermining: No Wound Description Classification: Full Thickness Without Exposed Support Structures Exudate Amount: Medium Exudate Type: Serosanguineous Exudate  Color: red, brown Foul Odor After Cleansing: No Slough/Fibrino Yes Wound Bed Granulation Amount: Medium (34-66%) Exposed Structure Granulation Quality: Red Fascia Exposed: No Necrotic Amount: Medium (34-66%) Fat Layer (Subcutaneous Tissue) Exposed: Yes Necrotic Quality: Adherent Slough Tendon Exposed: No Muscle Exposed: No Joint Exposed: No Bone Exposed: No Treatment Notes Wound #1 (Lower Leg) Wound Laterality: Right, Posterior, Proximal Cleanser Soap and Water Discharge Instruction: Gently cleanse wound with antibacterial soap, rinse and pat dry prior to dressing wounds Peri-Wound Care [name], [name] ([id number]) Topical Triamcinolone Acetonide Cream, 0.1%, 15 (g) tube Discharge Instruction: Apply as directed by [name] [name]. Primary Dressing Cutimed Sorbact 1.5x 2.38 (in/in) Discharge Instruction: Moisten with hydrogel-A bacteria- and fungi binding wound dressing, suitable for cavities and fistulas. It is suitable as a wound filler and allows the passage of wound exudate into a secondary dressing. The dressing helps reducing odor and pain and can improve healing. Secondary Dressing Xtrasorb Medium  4x5 (in/in) Discharge Instruction: Apply to wound as directed. Do not cut. Secured With Compression Wrap Profore Lite LF 3 Multilayer Compression Bandaging System Discharge Instruction: Apply 3 multi-layer wrap as prescribed. Compression Stockings Add-Ons Electronic Signature(s) Signed: 01/16/2021 4:46:55 PM By: Donnamarie Poag Entered By: Donnamarie Poag on 01/16/2021 15:16:26 [name] [name] ([id number]) -------------------------------------------------------------------------------- Wound Assessment Details Patient Name: [name] [name] Date of Service: 01/16/2021 3:00 PM Medical Record Number: [id number] Patient Account Number: [id number] Date of Birth/Sex: 01-Jul-1927 (87 y.o. F) Treating RN: Donnamarie Poag Primary Care Schneider Warchol: Tracie Harrier Other Clinician: Jeanine Luz Referring Keila Turan: Tracie Harrier Treating Sojourner Behringer/Extender: Tito Dine in Treatment: 6 Wound Status Wound Number: 2 Primary Venous Leg Ulcer Etiology: Wound Location: Right, Distal, Posterior Lower Leg Wound Open Wounding Event: Gradually Appeared Status: Date Acquired: 09/30/2020 Comorbid Chronic Obstructive Pulmonary Disease (COPD), Weeks Of Treatment: 6 History: Arrhythmia, Congestive Heart Failure, Hypertension, Clustered Wound: No Received Chemotherapy Photos Wound Measurements Length: (cm) 0.3 Width: (cm) 0.2 Depth: (cm) 0.1 Area: (cm) 0.047 Volume: (cm) 0.005 % Reduction in Area: 91.5% % Reduction in Volume: 90.9% Tunneling: No Undermining: No Wound Description Classification: Full Thickness Without Exposed Support Structures Exudate Amount: None Present Foul Odor After Cleansing: No Slough/Fibrino Yes Wound Bed Necrotic Amount: Large (67-100%) Exposed Structure Necrotic Quality: Eschar Fascia Exposed: No Fat Layer (Subcutaneous Tissue) Exposed: Yes Tendon Exposed: No Muscle Exposed: No Joint Exposed: No Bone Exposed: No Treatment Notes Wound #2 (Lower Leg) Wound  Laterality: Right, Posterior, Distal Cleanser Soap and Water Discharge Instruction: Gently cleanse wound with antibacterial soap, rinse and pat dry prior to dressing wounds Peri-Wound Care Topical [name], WILLCOX B. ([id number]) Triamcinolone Acetonide Cream, 0.1%, 15 (g) tube Discharge Instruction: Apply as directed by [name] [name]. Primary Dressing Cutimed Sorbact 1.5x 2.38 (in/in) Discharge Instruction: A bacteria- and fungi binding wound dressing, suitable for cavities and fistulas. It is suitable as a wound filler and allows the passage of wound exudate into a secondary dressing. The dressing helps reducing odor and pain and can improve healing. Secondary Dressing Xtrasorb Medium 4x5 (in/in) Discharge Instruction: Apply to wound as directed. Do not cut. Secured With Compression Wrap Profore Lite LF 3 Multilayer Compression Bandaging System Discharge Instruction: Apply 3 multi-layer wrap as prescribed. Compression Stockings Add-Ons Electronic Signature(s) Signed: 01/16/2021 4:46:55 PM By: Donnamarie Poag Entered By: Donnamarie Poag on 01/16/2021 15:17:12 [name] [name] ([id number]) -------------------------------------------------------------------------------- Vitals Details Patient Name: [name] [name] Date of Service: 01/16/2021 3:00 PM Medical Record Number: [id number] Patient Account Number: [id number] Date of Birth/Sex: 22-Jan-1927 (87 y.o. F) Treating RN: Donnamarie Poag Primary Care Jessiah Steinhart:  Hande, Vishwanath Other Clinician: Jeanine Luz Referring Vayla Wilhelmi: Tracie Harrier Treating Hebert Dooling/Extender: Tito Dine in Treatment: 6 Vital Signs Time Taken: 15:08 Temperature (F): 97.9 Height (in): 63 Pulse (bpm): 75 Weight (lbs): 174 Respiratory Rate (breaths/min): 18 Body Mass Index (BMI): 30.8 Blood Pressure (mmHg): 100/65 Reference Range: 80 - 120 mg / dl Electronic Signature(s) Signed: 01/16/2021 4:46:55 PM By: Donnamarie Poag Entered ByDonnamarie Poag on  01/16/2021 15:07:14

## 2021-01-21 DIAGNOSIS — I480 Paroxysmal atrial fibrillation: Secondary | ICD-10-CM | POA: Diagnosis not present

## 2021-01-21 DIAGNOSIS — R399 Unspecified symptoms and signs involving the genitourinary system: Secondary | ICD-10-CM | POA: Diagnosis not present

## 2021-01-21 DIAGNOSIS — R0600 Dyspnea, unspecified: Secondary | ICD-10-CM | POA: Diagnosis not present

## 2021-01-21 DIAGNOSIS — R8290 Unspecified abnormal findings in urine: Secondary | ICD-10-CM | POA: Diagnosis not present

## 2021-01-21 NOTE — Progress Notes (Signed)
NATALEIGH, GRIFFIN ([id number]) Visit Report for 01/16/2021 Debridement Details Patient Name: [name] [name], [name] [name]. Date of Service: 01/16/2021 3:00 PM Medical Record Number: [id number] Patient Account Number: [id number] Date of Birth/Sex: December 07, 1926 (87 y.o. F) Treating RN: Cornell Barman Primary Care Provider: Tracie Harrier Other Clinician: Jeanine Luz Referring Provider: Tracie Harrier Treating Provider/Extender: Tito Dine in Treatment: 6 Debridement Performed for Wound #1 Right,Proximal,Posterior Lower Leg Assessment: Performed By: Physician Ricard Dillon, MD Debridement Type: Debridement Severity of Tissue Pre Debridement: Fat layer exposed Level of Consciousness (Pre- Awake and Alert procedure): Pre-procedure Verification/Time Out Yes - 16:00 Taken: Start Time: 16:00 Total Area Debrided (L x W): 1 (cm) x 0.5 (cm) = 0.5 (cm) Tissue and other material Non-Viable, Slough, Slough debrided: Level: Non-Viable Tissue Debridement Description: Selective/Open Wound Instrument: Curette Bleeding: Minimum Hemostasis Achieved: Pressure Response to Treatment: Procedure was tolerated well Level of Consciousness (Post- Awake and Alert procedure): Post Debridement Measurements of Total Wound Length: (cm) 1 Width: (cm) 0.5 Depth: (cm) 0.4 Volume: (cm) 0.157 Character of Wound/Ulcer Post Debridement: Stable Severity of Tissue Post Debridement: Fat layer exposed Post Procedure Diagnosis Same as Pre-procedure Electronic Signature(s) Signed: 01/16/2021 5:04:28 PM By: Linton Ham MD Signed: 01/21/2021 10:27:07 AM By: Gretta Cool, BSN, RN, CWS, Kim RN, BSN Entered By: Linton Ham on 01/16/2021 16:48:53 [name] [name] ([id number]) -------------------------------------------------------------------------------- Debridement Details Patient Name: [name] [name], [name] B. Date of Service: 01/16/2021 3:00 PM Medical Record Number: [id number] Patient Account Number:  [id number] Date of Birth/Sex: [birth year] (87 y.o. F) Treating RN: Cornell Barman Primary Care Provider: Tracie Harrier Other Clinician: Jeanine Luz Referring Provider: Tracie Harrier Treating Provider/Extender: Tito Dine in Treatment: 6 Debridement Performed for Wound #2 Right,Distal,Posterior Lower Leg Assessment: Performed By: Physician Ricard Dillon, MD Debridement Type: Debridement Severity of Tissue Pre Debridement: Fat layer exposed Level of Consciousness (Pre- Awake and Alert procedure): Pre-procedure Verification/Time Out Yes - 16:00 Taken: Start Time: 16:00 Total Area Debrided (L x W): 0.3 (cm) x 0.3 (cm) = 0.09 (cm) Tissue and other material Non-Viable, [place] debrided: Level: Non-Viable Tissue Debridement Description: Selective/Open Wound Instrument: Curette Bleeding: Minimum Hemostasis Achieved: Pressure Response to Treatment: Procedure was tolerated well Level of Consciousness (Post- Awake and Alert procedure): Post Debridement Measurements of Total Wound Length: (cm) 0.3 Width: (cm) 0.2 Depth: (cm) 0.2 Volume: (cm) 0.009 Character of Wound/Ulcer Post Debridement: Stable Severity of Tissue Post Debridement: Fat layer exposed Post Procedure Diagnosis Same as Pre-procedure Electronic Signature(s) Signed: 01/16/2021 5:04:28 PM By: Linton Ham MD Signed: 01/21/2021 10:27:07 AM By: Gretta Cool, BSN, RN, CWS, Kim RN, BSN Entered By: Linton Ham on 01/16/2021 16:49:06 [name] [name] ([id number]) -------------------------------------------------------------------------------- HPI Details Patient Name: [name] [name], [name] B. Date of Service: 01/16/2021 3:00 PM Medical Record Number: [id number] Patient Account Number: [id number] Date of Birth/Sex: 11-09-1926 (87 y.o. F) Treating RN: Cornell Barman Primary Care Provider: Tracie Harrier Other Clinician: Jeanine Luz Referring Provider: Tracie Harrier Treating Provider/Extender:  Tito Dine in Treatment: 6 History of Present Illness HPI Description: ADMISSION 12/05/2020 This is an 87 year old woman who arrived accompanied by her daughter from [place]. She has been dealing with wounds on her bilateral posterior calf since January. There is no obvious antecedent injury. Her daughter tells me that she is actually had wounds that have opened and closed on her legs since last summer. She currently she has 2 wounds on the right posterior calf and 1 on the left. They have been using Vaseline on the wounds and covering the area with kerlix  but they have not been healing. The patient has been wearing what appears to be support stockings. Past medical history includes paroxysmal A. fib, pacemaker,, hypertension, PVD, COPD, left mastectomy chronic lower extremity swelling ABIs were noncompressible bilateral 3/23; 1 week follow-up. Bilateral venous insufficiency ulcers right greater than left. As opposed to what I was worried about last week she actually tolerated 3 layer compression. We used Hydrofera Blue 3/30; bilateral venous insufficiency ulcers right greater than left. The area on the left is healed this week still 2 areas on the posterior right calf that are both measuring smaller. We have been using Hydrofera Blue under 3 layer compression she is. She seems to be tolerating this 4/6; initially with bilateral venous insufficiency ulcers although the area on the left is healed. She has a juxta lite stocking here which was properly applied by her daughter. She still has 2 areas on the posterior calf. These have been difficult in terms of maintaining a surface we have been using Hydrofera Blue under 3 layer compression 4/13; patient initially came in with bilateral posterior calf venous insufficiency wounds. The area on the left are healed. She has a juxta lite stocking that she is using. She still has the 2 areas on the posterior calf. She said the pain in the  larger area was so bad she took her wrap off 2 days ago. We have been using Hydrofera Blue 01/09/21 upon evaluation today patient appears to be doing well in regards to her wound. I did not see that per se today I saw the picture but she actually had to come back and do the fact that her toes were somewhat abnormally discolored and the nurse was concerned about it. The patient was kind enough to come back in later in the afternoon for me to have a look. The good news is that this does not appear to be as discolored but I am concerned actually about the possibility of gout based on what I see. 4/27; the patient's right posterior calf wounds are both smaller. Still with slough which is adherent but did not generally better. She received 3 doses of colchicine last week for acute gout. She does not have a history of gout although this seems to have helped Electronic Signature(s) Signed: 01/16/2021 5:04:28 PM By: Linton Ham MD Entered By: Linton Ham on 01/16/2021 16:50:43 [name] [name] ([id number]) -------------------------------------------------------------------------------- Physical Exam Details Patient Name: [name] [name], MURALLES. Date of Service: 01/16/2021 3:00 PM Medical Record Number: [id number] Patient Account Number: [id number] Date of Birth/Sex: 1927-02-12 (87 y.o. F) Treating RN: Cornell Barman Primary Care Provider: Tracie Harrier Other Clinician: Jeanine Luz Referring Provider: Tracie Harrier Treating Provider/Extender: Ricard Dillon Weeks in Treatment: 6 Constitutional Sitting or standing Blood Pressure is within target range for patient.. Pulse regular and within target range for patient.Marland Kitchen Respirations regular, non- labored and within target range.. Temperature is normal and within the target range for the patient.Marland Kitchen appears in no distress. Notes Wound exam; there is no active or arthritis in the patient's right foot apparently involving the first and second MTPs o  The wounds on the posterior calf dose of require debridement with a #3 curette removing adherent slough. Minor bleeding controlled with direct pressure. This was a subcutaneous debridement Electronic Signature(s) Signed: 01/16/2021 5:04:28 PM By: Linton Ham MD Entered By: Linton Ham on 01/16/2021 16:53:59 [name] [name] ([id number]) -------------------------------------------------------------------------------- Physician Orders Details Patient Name: [name] [name] Date of Service: 01/16/2021 3:00 PM Medical Record Number: [id number] Patient Account  Number: [id number] Date of Birth/Sex: 09-26-1926 (87 y.o. F) Treating RN: Dolan Amen Primary Care Provider: Tracie Harrier Other Clinician: Jeanine Luz Referring Provider: Tracie Harrier Treating Provider/Extender: Tito Dine in Treatment: 6 Verbal / Phone Orders: No Diagnosis Coding Follow-up Appointments o Return Appointment in 1 week. Bathing/ Shower/ Hygiene o May shower with wound dressing protected with water repellent cover or cast protector. Edema Control - Lymphedema / Segmental Compressive Device / Other Bilateral Lower Extremities o Optional: One layer of unna paste to top of compression wrap (to act as an anchor). o Elevate, Exercise Daily and Avoid Standing for Long Periods of Time. o Elevate legs to the level of the heart and pump ankles as often as possible o Elevate leg(s) parallel to the floor when sitting. Additional Orders / Instructions o Follow Nutritious Diet and Increase Protein Intake Wound Treatment Wound #1 - Lower Leg Wound Laterality: Right, Posterior, Proximal Cleanser: Soap and Water 1 x Per Week/15 Days Discharge Instructions: Gently cleanse wound with antibacterial soap, rinse and pat dry prior to dressing wounds Topical: Triamcinolone Acetonide Cream, 0.1%, 15 (g) tube (Generic) 1 x Per Week/15 Days Discharge Instructions: Apply as directed by  provider. Primary Dressing: Cutimed Sorbact 1.5x 2.38 (in/in) 1 x Per Week/15 Days Discharge Instructions: Moisten with hydrogel-A bacteria- and fungi binding wound dressing, suitable for cavities and fistulas. It is suitable as a wound filler and allows the passage of wound exudate into a secondary dressing. The dressing helps reducing odor and pain and can improve healing. Secondary Dressing: Xtrasorb Medium 4x5 (in/in) 1 x Per Week/15 Days Discharge Instructions: Apply to wound as directed. Do not cut. Compression Wrap: Profore Lite LF 3 Multilayer Compression Bandaging System 1 x Per Week/15 Days Discharge Instructions: Apply 3 multi-layer wrap as prescribed. Wound #2 - Lower Leg Wound Laterality: Right, Posterior, Distal Cleanser: Soap and Water 1 x Per Week/15 Days Discharge Instructions: Gently cleanse wound with antibacterial soap, rinse and pat dry prior to dressing wounds Topical: Triamcinolone Acetonide Cream, 0.1%, 15 (g) tube (Generic) 1 x Per Week/15 Days Discharge Instructions: Apply as directed by provider. Primary Dressing: Cutimed Sorbact 1.5x 2.38 (in/in) 1 x Per Week/15 Days Discharge Instructions: A bacteria- and fungi binding wound dressing, suitable for cavities and fistulas. It is suitable as a wound filler and allows the passage of wound exudate into a secondary dressing. The dressing helps reducing odor and pain and can improve healing. Secondary Dressing: Xtrasorb Medium 4x5 (in/in) 1 x Per Week/15 Days Discharge Instructions: Apply to wound as directed. Do not cut. Compression Wrap: Profore Lite LF 3 Multilayer Compression Bandaging System 1 x Per Week/15 Days Discharge Instructions: Apply 3 multi-layer wrap as prescribed. CAYTLYN, EVERS ([id number]) Electronic Signature(s) Signed: 01/16/2021 5:04:28 PM By: Linton Ham MD Signed: 01/18/2021 8:24:32 AM By: Carlene Coria RN Entered By: Carlene Coria on 01/16/2021 16:21:41 [name] [name]  ([id number]) -------------------------------------------------------------------------------- Problem List Details Patient Name: EUNA, ARMON. Date of Service: 01/16/2021 3:00 PM Medical Record Number: [id number] Patient Account Number: [id number] Date of Birth/Sex: 1926/11/15 (87 y.o. F) Treating RN: Cornell Barman Primary Care Provider: Tracie Harrier Other Clinician: Jeanine Luz Referring Provider: Tracie Harrier Treating Provider/Extender: Tito Dine in Treatment: 6 Active Problems ICD-10 Encounter Code Description Active Date MDM Diagnosis I87.333 Chronic venous hypertension (idiopathic) with ulcer and inflammation of 12/05/2020 No Yes bilateral lower extremity L97.221 Non-pressure chronic ulcer of left calf limited to breakdown of skin 12/05/2020 No Yes L97.211 Non-pressure chronic ulcer of right calf limited  to breakdown of skin 12/05/2020 No Yes M10.071 Idiopathic gout, right ankle and foot 01/09/2021 No Yes Inactive Problems Resolved Problems Electronic Signature(s) Signed: 01/16/2021 5:04:28 PM By: Linton Ham MD Entered By: Linton Ham on 01/16/2021 16:45:23 [name] [name] ([id number]) -------------------------------------------------------------------------------- Progress Note Details Patient Name: [name] [name]. Date of Service: 01/16/2021 3:00 PM Medical Record Number: [id number] Patient Account Number: [id number] Date of Birth/Sex: [birth year] (87 y.o. F) Treating RN: Cornell Barman Primary Care Provider: Tracie Harrier Other Clinician: Jeanine Luz Referring Provider: Tracie Harrier Treating Provider/Extender: Tito Dine in Treatment: 6 Subjective History of Present Illness (HPI) ADMISSION 12/05/2020 This is a 87 year old woman who arrived accompanied by her daughter from [place]. She has been dealing with wounds on her bilateral posterior calf since January. There is no obvious antecedent injury. Her daughter  tells me that she is actually had wounds that have opened and closed on her legs since last summer. She currently she has 2 wounds on the right posterior calf and 1 on the left. They have been using Vaseline on the wounds and covering the area with kerlix but they have not been healing. The patient has been wearing what appears to be support stockings. Past medical history includes paroxysmal A. fib, pacemaker,, hypertension, PVD, COPD, left mastectomy chronic lower extremity swelling ABIs were noncompressible bilateral 3/23; 1 week follow-up. Bilateral venous insufficiency ulcers right greater than left. As opposed to what I was worried about last week she actually tolerated 3 layer compression. We used Hydrofera Blue 3/30; bilateral venous insufficiency ulcers right greater than left. The area on the left is healed this week still 2 areas on the posterior right calf that are both measuring smaller. We have been using Hydrofera Blue under 3 layer compression she is. She seems to be tolerating this 4/6; initially with bilateral venous insufficiency ulcers although the area on the left is healed. She has a juxta lite stocking here which was properly applied by her daughter. She still has 2 areas on the posterior calf. These have been difficult in terms of maintaining a surface we have been using Hydrofera Blue under 3 layer compression 4/13; patient initially came in with bilateral posterior calf venous insufficiency wounds. The area on the left are healed. She has a juxta lite stocking that she is using. She still has the 2 areas on the posterior calf. She said the pain in the larger area was so bad she took her wrap off 2 days ago. We have been using Hydrofera Blue 01/09/21 upon evaluation today patient appears to be doing well in regards to her wound. I did not see that per se today I saw the picture but she actually had to come back and do the fact that her toes were somewhat abnormally discolored  and the nurse was concerned about it. The patient was kind enough to come back in later in the afternoon for me to have a look. The good news is that this does not appear to be as discolored but I am concerned actually about the possibility of gout based on what I see. 4/27; the patient's right posterior calf wounds are both smaller. Still with slough which is adherent but did not generally better. She received 3 doses of colchicine last week for acute gout. She does not have a history of gout although this seems to have helped Objective Constitutional Sitting or standing Blood Pressure is within target range for patient.. Pulse regular and within target range for patient.Marland Kitchen Respirations  regular, non- labored and within target range.. Temperature is normal and within the target range for the patient.Marland Kitchen appears in no distress. Vitals Time Taken: 3:08 PM, Height: 63 in, Weight: 174 lbs, BMI: 30.8, Temperature: 97.9 F, Pulse: 75 bpm, Respiratory Rate: 18 breaths/min, Blood Pressure: 100/65 mmHg. General Notes: Wound exam; there is no active or arthritis in the patient's right foot apparently involving the first and second MTPs The wounds on the posterior calf dose of require debridement with a #3 curette removing adherent slough. Minor bleeding controlled with direct pressure. This was a subcutaneous debridement Integumentary (Hair, Skin) Wound #1 status is Open. Original cause of wound was Gradually Appeared. The date acquired was: 09/30/2020. The wound has been in treatment 6 weeks. The wound is located on the Right,Proximal,Posterior Lower Leg. The wound measures 1cm length x 0.5cm width x 0.3cm depth; 0.393cm^2 area and 0.118cm^3 volume. There is Fat Layer (Subcutaneous Tissue) exposed. There is no tunneling or undermining noted. There is a medium amount of serosanguineous drainage noted. There is medium (34-66%) red granulation within the wound bed. There is a medium (34-66%) amount of necrotic  tissue within the wound bed including Adherent Slough. Wound #2 status is Open. Original cause of wound was Gradually Appeared. The date acquired was: 09/30/2020. The wound has been in treatment [name] [name], [name] B. ([id number]) 6 weeks. The wound is located on the Right,Distal,Posterior Lower Leg. The wound measures 0.3cm length x 0.2cm width x 0.1cm depth; 0.047cm^2 area and 0.005cm^3 volume. There is Fat Layer (Subcutaneous Tissue) exposed. There is no tunneling or undermining noted. There is a none present amount of drainage noted. There is a large (67-100%) amount of necrotic tissue within the wound bed including Eschar. Assessment Active Problems ICD-10 Chronic venous hypertension (idiopathic) with ulcer and inflammation of bilateral lower extremity Non-pressure chronic ulcer of left calf limited to breakdown of skin Non-pressure chronic ulcer of right calf limited to breakdown of skin Idiopathic gout, right ankle and foot Procedures Wound #1 Pre-procedure diagnosis of Wound #1 is a Venous Leg Ulcer located on the Right,Proximal,Posterior Lower Leg .Severity of Tissue Pre Debridement is: Fat layer exposed. There was a Selective/Open Wound Non-Viable Tissue Debridement with a total area of 0.5 sq cm performed by Ricard Dillon, MD. With the following instrument(s): Curette to remove Non-Viable tissue/material. Material removed includes [place]. A time out was conducted at 16:00, prior to the start of the procedure. A Minimum amount of bleeding was controlled with Pressure. The procedure was tolerated well. Post Debridement Measurements: 1cm length x 0.5cm width x 0.4cm depth; 0.157cm^3 volume. Character of Wound/Ulcer Post Debridement is stable. Severity of Tissue Post Debridement is: Fat layer exposed. Post procedure Diagnosis Wound #1: Same as Pre-Procedure Pre-procedure diagnosis of Wound #1 is a Venous Leg Ulcer located on the Right,Proximal,Posterior Lower Leg . There was a Three  Layer Compression Therapy Procedure by Dolan Amen, RN. Post procedure Diagnosis Wound #1: Same as Pre-Procedure Notes: pt tolerating wrap well. Wound #2 Pre-procedure diagnosis of Wound #2 is a Venous Leg Ulcer located on the Right,Distal,Posterior Lower Leg .Severity of Tissue Pre Debridement is: Fat layer exposed. There was a Selective/Open Wound Non-Viable Tissue Debridement with a total area of 0.09 sq cm performed by Ricard Dillon, MD. With the following instrument(s): Curette to remove Non-Viable tissue/material. Material removed includes [place]. A time out was conducted at 16:00, prior to the start of the procedure. A Minimum amount of bleeding was controlled with Pressure. The procedure was tolerated well. Post  Debridement Measurements: 0.3cm length x 0.2cm width x 0.2cm depth; 0.009cm^3 volume. Character of Wound/Ulcer Post Debridement is stable. Severity of Tissue Post Debridement is: Fat layer exposed. Post procedure Diagnosis Wound #2: Same as Pre-Procedure Pre-procedure diagnosis of Wound #2 is a Venous Leg Ulcer located on the Right,Distal,Posterior Lower Leg . There was a Three Layer Compression Therapy Procedure by Dolan Amen, RN. Post procedure Diagnosis Wound #2: Same as Pre-Procedure Notes: pt tolerating wrap well. Plan Follow-up Appointments: Return Appointment in 1 week. Bathing/ Shower/ Hygiene: May shower with wound dressing protected with water repellent cover or cast protector. Edema Control - Lymphedema / Segmental Compressive Device / Other: Optional: One layer of unna paste to top of compression wrap (to act as an anchor). Elevate, Exercise Daily and Avoid Standing for Long Periods of Time. Elevate legs to the level of the heart and pump ankles as often as possible Elevate leg(s) parallel to the floor when sitting. Additional Orders / Instructions: Follow Nutritious Diet and Increase Protein Intake WOUND #1: - Lower Leg Wound Laterality: Right,  Posterior, Proximal Cleanser: Soap and Water 1 x Per Week/15 Days Discharge Instructions: Gently cleanse wound with antibacterial soap, rinse and pat dry prior to dressing wounds Topical: Triamcinolone Acetonide Cream, 0.1%, 15 (g) tube (Generic) 1 x Per Week/15 Days [name] [name], [name] [name] ([id number]) Discharge Instructions: Apply as directed by provider. Primary Dressing: Cutimed Sorbact 1.5x 2.38 (in/in) 1 x Per Week/15 Days Discharge Instructions: Moisten with hydrogel-A bacteria- and fungi binding wound dressing, suitable for cavities and fistulas. It is suitable as a wound filler and allows the passage of wound exudate into a secondary dressing. The dressing helps reducing odor and pain and can improve healing. Secondary Dressing: Xtrasorb Medium 4x5 (in/in) 1 x Per Week/15 Days Discharge Instructions: Apply to wound as directed. Do not cut. Compression Wrap: Profore Lite LF 3 Multilayer Compression Bandaging System 1 x Per Week/15 Days Discharge Instructions: Apply 3 multi-layer wrap as prescribed. WOUND #2: - Lower Leg Wound Laterality: Right, Posterior, Distal Cleanser: Soap and Water 1 x Per Week/15 Days Discharge Instructions: Gently cleanse wound with antibacterial soap, rinse and pat dry prior to dressing wounds Topical: Triamcinolone Acetonide Cream, 0.1%, 15 (g) tube (Generic) 1 x Per Week/15 Days Discharge Instructions: Apply as directed by provider. Primary Dressing: Cutimed Sorbact 1.5x 2.38 (in/in) 1 x Per Week/15 Days Discharge Instructions: A bacteria- and fungi binding wound dressing, suitable for cavities and fistulas. It is suitable as a wound filler and allows the passage of wound exudate into a secondary dressing. The dressing helps reducing odor and pain and can improve healing. Secondary Dressing: Xtrasorb Medium 4x5 (in/in) 1 x Per Week/15 Days Discharge Instructions: Apply to wound as directed. Do not cut. Compression Wrap: Profore Lite LF 3 Multilayer Compression  Bandaging System 1 x Per Week/15 Days Discharge Instructions: Apply 3 multi-layer wrap as prescribed. 1. Continuing with Sorbact under compression. The wounds are measuring smaller 2. The patient was given colchicine for pain in her first and second metatarsal heads which seems to have helped. This does not prove a diagnosis of gout however. She is apparently due for lab work next week by her primary physician I have asked them to see if we can get a uric acid level. There is no fluid in any joint that I can detect that would lend itself to an aspiration. No history of gout that I could elicit Electronic Signature(s) Signed: 01/16/2021 5:04:28 PM By: Linton Ham MD Entered By: Linton Ham on 01/16/2021  16:55:20 [name] [name], [name] [name] ([id number]) -------------------------------------------------------------------------------- SuperBill Details Patient Name: [name] [name], LAPERLE. Date of Service: 01/16/2021 Medical Record Number: [id number] Patient Account Number: [id number] Date of Birth/Sex: 01-07-1927 (87 y.o. 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 Facility Procedures CPT4 Code: [id number] Description: [phone] - DEBRIDE WOUND 1ST 20 SQ CM OR < Modifier: Quantity: 1 CPT4 Code: Description: ICD-10 Diagnosis Description L97.211 Non-pressure chronic ulcer of right calf limited to breakdown of skin Modifier: Quantity: Physician Procedures CPT4 [id number] Description: 97597 - WC PHYS DEBR WO ANESTH 20 SQ  CM Modifier: Quantity: 1 CPT4 Code: Description: ICD-10 Diagnosis Description L97.211 Non-pressure chronic ulcer of right calf limited to breakdown of skin Modifier: Quantity: Electronic Signature(s) Signed: 01/16/2021 5:04:28 PM By: Linton Ham MD Previous Signature: 01/16/2021 4:49:58 PM Version By: Georges Mouse, Minus Breeding RN Entered By: Linton Ham on 01/16/2021 16:55:30

## 2021-01-23 ENCOUNTER — Encounter: Payer: PPO | Attending: Internal Medicine | Admitting: Internal Medicine

## 2021-01-23 ENCOUNTER — Other Ambulatory Visit: Payer: Self-pay

## 2021-01-23 DIAGNOSIS — L97221 Non-pressure chronic ulcer of left calf limited to breakdown of skin: Secondary | ICD-10-CM | POA: Diagnosis not present

## 2021-01-23 DIAGNOSIS — I480 Paroxysmal atrial fibrillation: Secondary | ICD-10-CM | POA: Insufficient documentation

## 2021-01-23 DIAGNOSIS — I509 Heart failure, unspecified: Secondary | ICD-10-CM | POA: Insufficient documentation

## 2021-01-23 DIAGNOSIS — I110 Hypertensive heart disease with heart failure: Secondary | ICD-10-CM | POA: Diagnosis not present

## 2021-01-23 DIAGNOSIS — L97211 Non-pressure chronic ulcer of right calf limited to breakdown of skin: Secondary | ICD-10-CM | POA: Diagnosis not present

## 2021-01-23 DIAGNOSIS — I872 Venous insufficiency (chronic) (peripheral): Secondary | ICD-10-CM | POA: Diagnosis not present

## 2021-01-23 DIAGNOSIS — I87333 Chronic venous hypertension (idiopathic) with ulcer and inflammation of bilateral lower extremity: Secondary | ICD-10-CM | POA: Insufficient documentation

## 2021-01-23 DIAGNOSIS — M10071 Idiopathic gout, right ankle and foot: Secondary | ICD-10-CM | POA: Insufficient documentation

## 2021-01-23 DIAGNOSIS — L97212 Non-pressure chronic ulcer of right calf with fat layer exposed: Secondary | ICD-10-CM | POA: Diagnosis not present

## 2021-01-23 DIAGNOSIS — I739 Peripheral vascular disease, unspecified: Secondary | ICD-10-CM | POA: Diagnosis not present

## 2021-01-24 NOTE — Progress Notes (Signed)
MARLY, SCHULD ([id number]) Visit Report for 01/23/2021 Arrival Information Details Patient Name: [name] [name], [name] [name]. Date of Service: 01/23/2021 3:00 PM Medical Record Number: [id number] Patient Account Number: [id number] Date of Birth/Sex: April 05, 1927 (87 y.o. F) Treating RN: Cornell Barman Primary Care Palak Tercero: Tracie Harrier Other Clinician: Jeanine Luz Referring Joye Wesenberg: Tracie Harrier Treating Presley Gora/Extender: Tito Dine in Treatment: 7 Visit Information History Since Last Visit Added or deleted any medications: No Patient Arrived: [name] [name] Had a fall or experienced change in No Arrival Time: 15:07 activities of daily living that may affect Accompanied By: daughter risk of falls: Transfer Assistance: None Hospitalized since last visit: No Patient Identification Verified: Yes Pain Present Now: No Secondary Verification Process Completed: Yes Patient Requires Transmission-Based No Precautions: Patient Has Alerts: Yes Patient Alerts: Patient on Blood Thinner warfarin NON COMPRESSIBLE L and R NOT DIABETIC Electronic Signature(s) Signed: 01/23/2021 4:20:32 PM By: Jeanine Luz Entered By: Jeanine Luz on 01/23/2021 15:09:39 [name] [name] ([id number]) -------------------------------------------------------------------------------- Encounter Discharge Information Details Patient Name: [name] [name] Date of Service: 01/23/2021 3:00 PM Medical Record Number: [id number] Patient Account Number: [id number] Date of Birth/Sex: Sep 01, 1927 (87 y.o. F) Treating RN: Cornell Barman Primary Care Niquita Digioia: Tracie Harrier Other Clinician: Jeanine Luz Referring Pruitt Taboada: Tracie Harrier Treating Jaquane Boughner/Extender: Tito Dine in Treatment: 7 Encounter Discharge Information Items Post Procedure Vitals Discharge Condition: Stable Temperature (F): 98.1 Ambulatory Status: Ambulatory Pulse (bpm): 73 Discharge Destination: Home Respiratory Rate  (breaths/min): 16 Transportation: Private Auto Blood Pressure (mmHg): 106/71 Accompanied By: daughter Schedule Follow-up Appointment: Yes Clinical Summary of Care: Electronic Signature(s) Signed: 01/23/2021 4:20:32 PM By: Jeanine Luz Entered By: Jeanine Luz on 01/23/2021 16:08:48 [name] [name] ([id number]) -------------------------------------------------------------------------------- Lower Extremity Assessment Details Patient Name: [name] [name] Date of Service: 01/23/2021 3:00 PM Medical Record Number: [id number] Patient Account Number: [id number] Date of Birth/Sex: 1926-12-14 (87 y.o. F) Treating RN: Cornell Barman Primary Care Latronda Spink: Tracie Harrier Other Clinician: Jeanine Luz Referring Amiliah Campisi: Tracie Harrier Treating Phu Record/Extender: Tito Dine in Treatment: 7 Edema Assessment Assessed: [Left: No] [Right: Yes] Edema: [Left: Ye] [Right: s] Calf Left: Right: Point of Measurement: 31 cm From Medial Instep 32.9 cm Ankle Left: Right: Point of Measurement: 11 cm From Medial Instep 19.6 cm Vascular Assessment Pulses: Dorsalis Pedis Palpable: [Right:Yes] Electronic Signature(s) Signed: 01/23/2021 4:20:32 PM By: Jeanine Luz Signed: 01/23/2021 6:13:14 PM By: Gretta Cool, BSN, RN, CWS, Kim RN, BSN Entered By: Jeanine Luz on 01/23/2021 15:25:46 [name] [name] ([id number]) -------------------------------------------------------------------------------- Multi Wound Chart Details Patient Name: [name] Osmond [name]. Date of Service: 01/23/2021 3:00 PM Medical Record Number: [id number] Patient Account Number: [id number] Date of Birth/Sex: 06/16/1927 (87 y.o. F) Treating RN: Cornell Barman Primary Care Verneta Hamidi: Tracie Harrier Other Clinician: Jeanine Luz Referring Bandon Sherwin: Tracie Harrier Treating Pardeep Pautz/Extender: Tito Dine in Treatment: 7 Vital Signs Height(in): 12 Pulse(bpm): 70 Weight(lbs): 174 Blood Pressure(mmHg):  106/71 Body Mass Index(BMI): 31 Temperature(F): 97.9 Respiratory Rate(breaths/min): 16 Photos: [N/A:N/A] Wound Location: Right, Proximal, Posterior Lower Right, Distal, Posterior Lower Leg N/A Leg Wounding Event: Gradually Appeared Gradually Appeared N/A Primary Etiology: Venous Leg Ulcer Venous Leg Ulcer N/A Comorbid History: Chronic Obstructive Pulmonary Chronic Obstructive Pulmonary N/A Disease (COPD), Arrhythmia, Disease (COPD), Arrhythmia, Congestive Heart Failure, Congestive Heart Failure, Hypertension, Received Hypertension, Received Chemotherapy Chemotherapy Date Acquired: 09/30/2020 09/30/2020 N/A Weeks of Treatment: 7 7 N/A Wound Status: Open Open N/A Measurements L x W x D (cm) 0.9x0.4x0.3 0.4x0.2x0.1 N/A Area (cm) : 0.283 0.063 N/A Volume (cm) : 0.085 0.006 N/A % Reduction in Area:  87.10% 88.50% N/A % Reduction in Volume: 61.40% 89.10% N/A Classification: Full Thickness Without Exposed Full Thickness Without Exposed N/A Support Structures Support Structures Exudate Amount: Medium None Present N/A Exudate Type: Serosanguineous N/A N/A Exudate Color: red, brown N/A N/A Granulation Amount: Small (1-33%) N/A N/A Granulation Quality: Red N/A N/A Necrotic Amount: Large (67-100%) N/A N/A Necrotic Tissue: Eschar, Adherent Slough Eschar N/A Exposed Structures: Fat Layer (Subcutaneous Tissue): Fat Layer (Subcutaneous Tissue): N/A Yes Yes Fascia: No Fascia: No Tendon: No Tendon: No Muscle: No Muscle: No Joint: No Joint: No Bone: No Bone: No Epithelialization: Small (1-33%) N/A N/A Debridement: Debridement - Excisional N/A N/A Pre-procedure Verification/Time 15:44 N/A N/A Out Taken: Tissue Debrided: Subcutaneous, Slough N/A N/A Level: Skin/Subcutaneous Tissue N/A N/A Debridement Area (sq cm): 0.36 N/A N/A Instrument: Curette N/A N/A Bleeding: Minimum N/A N/A Hemostasis Achieved: Pressure N/A N/A GERMANI, GAVILANES ([id number]) Debridement Treatment Procedure was  tolerated well N/A N/A Response: Post Debridement 0.9x0.4x0.5 N/A N/A Measurements L x W x D (cm) Post Debridement Volume: 0.141 N/A N/A (cm) Procedures Performed: Debridement N/A N/A Treatment Notes Wound #1 (Lower Leg) Wound Laterality: Right, Posterior, Proximal Cleanser Soap and Water Discharge Instruction: Gently cleanse wound with antibacterial soap, rinse and pat dry prior to dressing wounds Peri-Wound Care Topical Triamcinolone Acetonide Cream, 0.1%, 15 (g) tube Discharge Instruction: Apply as directed by Almer Littleton. Primary Dressing Prisma 4.34 (in) Discharge Instruction: Moisten w/normal saline or sterile water; Cover wound as directed. Do not remove from wound bed. Secondary Dressing Xtrasorb Medium 4x5 (in/in) Discharge Instruction: Apply to wound as directed. Do not cut. Secured With Compression Wrap Profore Lite LF 3 Multilayer Compression Bandaging System Discharge Instruction: Apply 3 multi-layer wrap as prescribed. Compression Stockings Add-Ons Wound #2 (Lower Leg) Wound Laterality: Right, Posterior, Distal Cleanser Soap and Water Discharge Instruction: Gently cleanse wound with antibacterial soap, rinse and pat dry prior to dressing wounds Peri-Wound Care Topical Triamcinolone Acetonide Cream, 0.1%, 15 (g) tube Discharge Instruction: Apply as directed by Gorje Iyer. Primary Dressing Prisma 4.34 (in) Discharge Instruction: Moisten w/normal saline or sterile water; Cover wound as directed. Do not remove from wound bed. Secondary Dressing Xtrasorb Medium 4x5 (in/in) Discharge Instruction: Apply to wound as directed. Do not cut. Secured With Compression Wrap Profore Lite LF 3 Multilayer Compression Bandaging System Discharge Instruction: Apply 3 multi-layer wrap as prescribed. DARNISE, MONTAG ([id number]) Compression Stockings Add-Ons Electronic Signature(s) Signed: 01/23/2021 4:35:29 PM By: Linton Ham MD Entered By: Linton Ham on 01/23/2021  16:11:36 [name] [name] ([id number]) -------------------------------------------------------------------------------- Opal Details Patient Name: [name] [name], [name] [name]. Date of Service: 01/23/2021 3:00 PM Medical Record Number: [id number] Patient Account Number: [id number] Date of Birth/Sex: 1927/06/17 (87 y.o. F) Treating RN: Cornell Barman Primary Care Jara Feider: Tracie Harrier Other Clinician: Jeanine Luz Referring Dillin Lofgren: Tracie Harrier Treating Pixie Burgener/Extender: Tito Dine in Treatment: 7 Active Inactive Necrotic Tissue Nursing Diagnoses: Impaired tissue integrity related to necrotic/devitalized tissue Goals: Necrotic/devitalized tissue will be minimized in the wound bed Date Initiated: 12/05/2020 Target Resolution Date: 12/19/2020 Goal Status: Active Interventions: Assess patient pain level pre-, during and post procedure and prior to discharge Treatment Activities: Apply topical anesthetic as ordered : 12/05/2020 Notes: Venous Leg Ulcer Nursing Diagnoses: Actual venous Insuffiency (use after diagnosis is confirmed) Goals: Patient will maintain optimal edema control Date Initiated: 12/05/2020 Target Resolution Date: 12/19/2020 Goal Status: Active Interventions: Assess peripheral edema status every visit. Compression as ordered Provide education on venous insufficiency Treatment Activities: Therapeutic compression applied : 12/05/2020 Notes: Wound/Skin Impairment Nursing Diagnoses:  Impaired tissue integrity Goals: Patient/caregiver will verbalize understanding of skin care regimen Date Initiated: 12/05/2020 Target Resolution Date: 12/05/2020 Goal Status: Active Ulcer/skin breakdown will have a volume reduction of 30% by week 4 Date Initiated: 12/05/2020 Target Resolution Date: 01/05/2021 Goal Status: Active Interventions: Assess patient/caregiver ability to perform ulcer/skin care regimen upon admission and as needed Assess  ulceration(s) every visit KALLY, KRUMMEL ([id number]) Treatment Activities: Skin care regimen initiated : 12/05/2020 Topical wound management initiated : 12/05/2020 Notes: Electronic Signature(s) Signed: 01/23/2021 6:13:14 PM By: Gretta Cool, BSN, RN, CWS, Kim RN, BSN Entered By: Gretta Cool, BSN, RN, CWS, Kim on 01/23/2021 15:41:44 [name] [name] ([id number]) -------------------------------------------------------------------------------- Pain Assessment Details Patient Name: [name] [name], [name] [name]. Date of Service: 01/23/2021 3:00 PM Medical Record Number: [id number] Patient Account Number: [id number] Date of Birth/Sex: 04/01/1927 (87 y.o. F) Treating RN: Cornell Barman Primary Care Citlalli Weikel: Tracie Harrier Other Clinician: Jeanine Luz Referring Joneisha Miles: Tracie Harrier Treating Ilee Randleman/Extender: Tito Dine in Treatment: 7 Active Problems Location of Pain Severity and Description of Pain Patient Has Paino No Site Locations Rate the pain. Current Pain Level: 0 Pain Management and Medication Current Pain Management: Electronic Signature(s) Signed: 01/23/2021 4:20:32 PM By: Jeanine Luz Signed: 01/23/2021 6:13:14 PM By: Gretta Cool, BSN, RN, CWS, Kim RN, BSN Entered By: Jeanine Luz on 01/23/2021 15:12:39 [name] [name] ([id number]) -------------------------------------------------------------------------------- Patient/Caregiver Education Details Patient Name: [name] [name] Date of Service: 01/23/2021 3:00 PM Medical Record Number: [id number] Patient Account Number: [id number] Date of Birth/Gender: 01/01/1927 (87 y.o. F) Treating RN: Cornell Barman Primary Care Physician: Tracie Harrier Other Clinician: Jeanine Luz Referring Physician: Tracie Harrier Treating Physician/Extender: Tito Dine in Treatment: 7 Education Assessment Education Provided To: Patient Education Topics Provided Wound Debridement: Handouts: Wound Debridement Methods: Demonstration,  Explain/Verbal Responses: State content correctly Wound/Skin Impairment: Handouts: Caring for Your Ulcer Methods: Demonstration, Explain/Verbal Responses: State content correctly Electronic Signature(s) Signed: 01/23/2021 6:13:14 PM By: Gretta Cool, BSN, RN, CWS, Kim RN, BSN Entered By: Gretta Cool, BSN, RN, CWS, Kim on 01/23/2021 15:47:04 [name] [name] ([id number]) -------------------------------------------------------------------------------- Wound Assessment Details Patient Name: TINESHIA, BOSTIAN. Date of Service: 01/23/2021 3:00 PM Medical Record Number: [id number] Patient Account Number: [id number] Date of Birth/Sex: 11-08-1926 (87 y.o. F) Treating RN: Cornell Barman Primary Care Cicely Ortner: Tracie Harrier Other Clinician: Jeanine Luz Referring Ravinder Hofland: Tracie Harrier Treating Mittie Knittel/Extender: Tito Dine in Treatment: 7 Wound Status Wound Number: 1 Primary Venous Leg Ulcer Etiology: Wound Location: Right, Proximal, Posterior Lower Leg Wound Open Wounding Event: Gradually Appeared Status: Date Acquired: 09/30/2020 Comorbid Chronic Obstructive Pulmonary Disease (COPD), Weeks Of Treatment: 7 History: Arrhythmia, Congestive Heart Failure, Hypertension, Clustered Wound: No Received Chemotherapy Photos Wound Measurements Length: (cm) 0.9 Width: (cm) 0.4 Depth: (cm) 0.3 Area: (cm) 0.283 Volume: (cm) 0.085 % Reduction in Area: 87.1% % Reduction in Volume: 61.4% Epithelialization: Small (1-33%) Tunneling: No Undermining: No Wound Description Classification: Full Thickness Without Exposed Support Structures Exudate Amount: Medium Exudate Type: Serosanguineous Exudate Color: red, brown Foul Odor After Cleansing: No Slough/Fibrino Yes Wound Bed Granulation Amount: Small (1-33%) Exposed Structure Granulation Quality: Red Fascia Exposed: No Necrotic Amount: Large (67-100%) Fat Layer (Subcutaneous Tissue) Exposed: Yes Necrotic Quality: Eschar, Adherent  Slough Tendon Exposed: No Muscle Exposed: No Joint Exposed: No Bone Exposed: No Treatment Notes Wound #1 (Lower Leg) Wound Laterality: Right, Posterior, Proximal Cleanser Soap and Water Discharge Instruction: Gently cleanse wound with antibacterial soap, rinse and pat dry prior to dressing wounds Peri-Wound Care EMILYJO, CLAYSON [name]. ([id number]) Topical Triamcinolone Acetonide Cream, 0.1%, 15 (g) tube  Discharge Instruction: Apply as directed by Rishith Siddoway. Primary Dressing Prisma 4.34 (in) Discharge Instruction: Moisten w/normal saline or sterile water; Cover wound as directed. Do not remove from wound bed. Secondary Dressing Xtrasorb Medium 4x5 (in/in) Discharge Instruction: Apply to wound as directed. Do not cut. Secured With Compression Wrap Profore Lite LF 3 Multilayer Compression Bandaging System Discharge Instruction: Apply 3 multi-layer wrap as prescribed. Compression Stockings Add-Ons Electronic Signature(s) Signed: 01/23/2021 4:20:32 PM By: Jeanine Luz Signed: 01/23/2021 6:13:14 PM By: Gretta Cool, BSN, RN, CWS, Kim RN, BSN Entered By: Jeanine Luz on 01/23/2021 15:23:24 [name] [name] ([id number]) -------------------------------------------------------------------------------- Wound Assessment Details Patient Name: HAIZLEE, HENTON. Date of Service: 01/23/2021 3:00 PM Medical Record Number: [id number] Patient Account Number: [id number] Date of Birth/Sex: 1926-10-31 (87 y.o. F) Treating RN: Cornell Barman Primary Care Ladesha Pacini: Tracie Harrier Other Clinician: Jeanine Luz Referring Keelynn Furgerson: Tracie Harrier Treating Lemario Chaikin/Extender: Tito Dine in Treatment: 7 Wound Status Wound Number: 2 Primary Venous Leg Ulcer Etiology: Wound Location: Right, Distal, Posterior Lower Leg Wound Open Wounding Event: Gradually Appeared Status: Date Acquired: 09/30/2020 Comorbid Chronic Obstructive Pulmonary Disease (COPD), Weeks Of Treatment: 7 History: Arrhythmia,  Congestive Heart Failure, Hypertension, Clustered Wound: No Received Chemotherapy Photos Wound Measurements Length: (cm) 0.4 Width: (cm) 0.2 Depth: (cm) 0.1 Area: (cm) 0.063 Volume: (cm) 0.006 % Reduction in Area: 88.5% % Reduction in Volume: 89.1% Tunneling: No Undermining: No Wound Description Classification: Full Thickness Without Exposed Support Structures Exudate Amount: None Present Foul Odor After Cleansing: No Slough/Fibrino No Wound Bed Necrotic Amount: Large (67-100%) Exposed Structure Necrotic Quality: Eschar Fascia Exposed: No Fat Layer (Subcutaneous Tissue) Exposed: Yes Tendon Exposed: No Muscle Exposed: No Joint Exposed: No Bone Exposed: No Treatment Notes Wound #2 (Lower Leg) Wound Laterality: Right, Posterior, Distal Cleanser Soap and Water Discharge Instruction: Gently cleanse wound with antibacterial soap, rinse and pat dry prior to dressing wounds Peri-Wound Care Topical VICKKI, IGOU [name]. ([id number]) Triamcinolone Acetonide Cream, 0.1%, 15 (g) tube Discharge Instruction: Apply as directed by Tatiyana Foucher. Primary Dressing Prisma 4.34 (in) Discharge Instruction: Moisten w/normal saline or sterile water; Cover wound as directed. Do not remove from wound bed. Secondary Dressing Xtrasorb Medium 4x5 (in/in) Discharge Instruction: Apply to wound as directed. Do not cut. Secured With Compression Wrap Profore Lite LF 3 Multilayer Compression Bandaging System Discharge Instruction: Apply 3 multi-layer wrap as prescribed. Compression Stockings Add-Ons Electronic Signature(s) Signed: 01/23/2021 4:20:32 PM By: Jeanine Luz Signed: 01/23/2021 6:13:14 PM By: Gretta Cool, BSN, RN, CWS, Kim RN, BSN Entered By: Jeanine Luz on 01/23/2021 15:23:54 [name] [name] ([id number]) -------------------------------------------------------------------------------- Vitals Details Patient Name: [name] [name] Date of Service: 01/23/2021 3:00 PM Medical Record Number:  [id number] Patient Account Number: [id number] Date of Birth/Sex: 11-22-1926 (87 y.o. F) Treating RN: Cornell Barman Primary Care Donnette Macmullen: Tracie Harrier Other Clinician: Jeanine Luz Referring Vale Peraza: Tracie Harrier Treating Georgeanna Radziewicz/Extender: Tito Dine in Treatment: 7 Vital Signs Time Taken: 15:12 Temperature (F): 97.9 Height (in): 63 Pulse (bpm): 73 Weight (lbs): 174 Respiratory Rate (breaths/min): 16 Body Mass Index (BMI): 30.8 Blood Pressure (mmHg): 106/71 Reference Range: 80 - 120 mg / dl Electronic Signature(s) Signed: 01/23/2021 4:20:32 PM By: Jeanine Luz Entered By: Jeanine Luz on 01/23/2021 15:12:29

## 2021-01-24 NOTE — Progress Notes (Signed)
[name] [name] ([id number]) Visit Report for 01/23/2021 Debridement Details Patient Name: [name] [name], [name] [name]. Date of Service: 01/23/2021 3:00 PM Medical Record Number: [id number] Patient Account Number: [id number] Date of Birth/Sex: [birth year] (87 y.o. F) Treating RN: Cornell Barman Primary Care Provider: Tracie Harrier Other Clinician: Jeanine Luz Referring Provider: Tracie Harrier Treating Provider/Extender: Tito Dine in Treatment: 7 Debridement Performed for Wound #1 Right,Proximal,Posterior Lower Leg Assessment: Performed By: Physician Ricard Dillon, MD Debridement Type: Debridement Severity of Tissue Pre Debridement: Fat layer exposed Level of Consciousness (Pre- Awake and Alert procedure): Pre-procedure Verification/Time Out Yes - 15:44 Taken: Total Area Debrided (L x W): 0.9 (cm) x 0.4 (cm) = 0.36 (cm) Tissue and other material Viable, Non-Viable, Slough, Subcutaneous, Slough debrided: Level: Skin/Subcutaneous Tissue Debridement Description: Excisional Instrument: Curette Bleeding: Minimum Hemostasis Achieved: Pressure Response to Treatment: Procedure was tolerated well Level of Consciousness (Post- Awake and Alert procedure): Post Debridement Measurements of Total Wound Length: (cm) 0.9 Width: (cm) 0.4 Depth: (cm) 0.5 Volume: (cm) 0.141 Character of Wound/Ulcer Post Debridement: Stable Severity of Tissue Post Debridement: Fat layer exposed Post Procedure Diagnosis Same as Pre-procedure Electronic Signature(s) Signed: 01/23/2021 4:35:29 PM By: Linton Ham MD Signed: 01/23/2021 6:13:14 PM By: Gretta Cool, BSN, RN, CWS, Kim RN, BSN Entered By: Linton Ham on 01/23/2021 16:11:47 [name] [name] ([id number]) -------------------------------------------------------------------------------- HPI Details Patient Name: [name], [name] Date of Service: 01/23/2021 3:00 PM Medical Record Number: [id number] Patient Account Number: [id number] Date of  Birth/Sex: [birth year] (87 y.o. F) Treating RN: Cornell Barman Primary Care Provider: Tracie Harrier Other Clinician: Jeanine Luz Referring Provider: Tracie Harrier Treating Provider/Extender: Tito Dine in Treatment: 7 History of Present Illness HPI Description: ADMISSION 12/05/2020 This is a 87 year old woman who arrived accompanied by her daughter from [place]. She has been dealing with wounds on her bilateral posterior calf since January. There is no obvious antecedent injury. Her daughter tells me that she is actually had wounds that have opened and closed on her legs since last summer. She currently she has 2 wounds on the right posterior calf and 1 on the left. They have been using Vaseline on the wounds and covering the area with kerlix but they have not been healing. The patient has been wearing what appears to be support stockings. Past medical history includes paroxysmal A. fib, pacemaker,, hypertension, PVD, COPD, left mastectomy chronic lower extremity swelling ABIs were noncompressible bilateral 3/23; 1 week follow-up. Bilateral venous insufficiency ulcers right greater than left. As opposed to what I was worried about last week she actually tolerated 3 layer compression. We used Hydrofera Blue 3/30; bilateral venous insufficiency ulcers right greater than left. The area on the left is healed this week still 2 areas on the posterior right calf that are both measuring smaller. We have been using Hydrofera Blue under 3 layer compression she is. She seems to be tolerating this 4/6; initially with bilateral venous insufficiency ulcers although the area on the left is healed. She has a juxta lite stocking here which was properly applied by her daughter. She still has 2 areas on the posterior calf. These have been difficult in terms of maintaining a surface we have been using Hydrofera Blue under 3 layer compression 4/13; patient initially came in with bilateral  posterior calf venous insufficiency wounds. The area on the left are healed. She has a juxta lite stocking that she is using. She still has the 2 areas on the posterior calf. She said the pain in the larger area  was so bad she took her wrap off 2 days ago. We have been using Hydrofera Blue 01/09/21 upon evaluation today patient appears to be doing well in regards to her wound. I did not see that per se today I saw the picture but she actually had to come back and do the fact that her toes were somewhat abnormally discolored and the nurse was concerned about it. The patient was kind enough to come back in later in the afternoon for me to have a look. The good news is that this does not appear to be as discolored but I am concerned actually about the possibility of gout based on what I see. 4/27; the patient's right posterior calf wounds are both smaller. Still with slough which is adherent but did not generally better. She received 3 doses of colchicine last week for acute gout. She does not have a history of gout although this seems to have helped 5/4; right posterior calf wounds. Superior wound still with a nonviable surface inferior wound also. Using silver alginate Electronic Signature(s) Signed: 01/23/2021 4:35:29 PM By: Linton Ham MD Entered By: Linton Ham on 01/23/2021 16:14:22 [name] [name] ([id number]) -------------------------------------------------------------------------------- Physical Exam Details Patient Name: [name] [name] Date of Service: 01/23/2021 3:00 PM Medical Record Number: [id number] Patient Account Number: [id number] Date of Birth/Sex: 1927-05-23 (87 y.o. F) Treating RN: Cornell Barman Primary Care Provider: Tracie Harrier Other Clinician: Jeanine Luz Referring Provider: Tracie Harrier Treating Provider/Extender: Ricard Dillon Weeks in Treatment: 7 Constitutional Sitting or standing Blood Pressure is within target range for patient.. Pulse regular  and within target range for patient.Marland Kitchen Respirations regular, non- labored and within target range.. Temperature is normal and within the target range for the patient.Marland Kitchen appears in no distress. Notes Wound exam; still debrided with a #3 curette removing adherent slough and subcutaneous tissue. The area distally is just about closed. Electronic Signature(s) Signed: 01/23/2021 4:35:29 PM By: Linton Ham MD Entered By: Linton Ham on 01/23/2021 16:15:45 [name] [name] ([id number]) -------------------------------------------------------------------------------- Physician Orders Details Patient Name: [name] [name] Date of Service: 01/23/2021 3:00 PM Medical Record Number: [id number] Patient Account Number: [id number] Date of Birth/Sex: 05-Apr-1927 (87 y.o. F) Treating RN: Cornell Barman Primary Care Provider: Tracie Harrier Other Clinician: Jeanine Luz Referring Provider: Tracie Harrier Treating Provider/Extender: Tito Dine in Treatment: 7 Verbal / Phone Orders: No Diagnosis Coding Follow-up Appointments o Return Appointment in 1 week. Bathing/ Shower/ Hygiene o May shower with wound dressing protected with water repellent cover or cast protector. Edema Control - Lymphedema / Segmental Compressive Device / Other Bilateral Lower Extremities o Optional: One layer of unna paste to top of compression wrap (to act as an anchor). o Elevate, Exercise Daily and Avoid Standing for Long Periods of Time. o Elevate legs to the level of the heart and pump ankles as often as possible o Elevate leg(s) parallel to the floor when sitting. Additional Orders / Instructions o Follow Nutritious Diet and Increase Protein Intake Wound Treatment Wound #1 - Lower Leg Wound Laterality: Right, Posterior, Proximal Cleanser: Soap and Water 1 x Per Week/15 Days Discharge Instructions: Gently cleanse wound with antibacterial soap, rinse and pat dry prior to dressing  wounds Topical: Triamcinolone Acetonide Cream, 0.1%, 15 (g) tube (Generic) 1 x Per Week/15 Days Discharge Instructions: Apply as directed by provider. Primary Dressing: Prisma 4.34 (in) 1 x Per Week/15 Days Discharge Instructions: Moisten w/normal saline or sterile water; Cover wound as directed. Do not remove from wound bed. Secondary Dressing:  Xtrasorb Medium 4x5 (in/in) 1 x Per Week/15 Days Discharge Instructions: Apply to wound as directed. Do not cut. Compression Wrap: Profore Lite LF 3 Multilayer Compression Bandaging System 1 x Per Week/15 Days Discharge Instructions: Apply 3 multi-layer wrap as prescribed. Wound #2 - Lower Leg Wound Laterality: Right, Posterior, Distal Cleanser: Soap and Water 1 x Per Week/15 Days Discharge Instructions: Gently cleanse wound with antibacterial soap, rinse and pat dry prior to dressing wounds Topical: Triamcinolone Acetonide Cream, 0.1%, 15 (g) tube (Generic) 1 x Per Week/15 Days Discharge Instructions: Apply as directed by provider. Primary Dressing: Prisma 4.34 (in) 1 x Per Week/15 Days Discharge Instructions: Moisten w/normal saline or sterile water; Cover wound as directed. Do not remove from wound bed. Secondary Dressing: Xtrasorb Medium 4x5 (in/in) 1 x Per Week/15 Days Discharge Instructions: Apply to wound as directed. Do not cut. Compression Wrap: Profore Lite LF 3 Multilayer Compression Bandaging System 1 x Per Week/15 Days Discharge Instructions: Apply 3 multi-layer wrap as prescribed. Electronic Signature(s) Signed: 01/23/2021 4:35:29 PM By: Linton Ham MD Signed: 01/23/2021 6:13:14 PM By: Gretta Cool BSN, RN, CWS, Kim RN, BSN [place], Deborah Chalk ([id number]) Entered By: Gretta Cool, BSN, RN, CWS, Kim on 01/23/2021 15:46:40 [name] [name] ([id number]) -------------------------------------------------------------------------------- Problem List Details Patient Name: SHANTELLA, FEELEY Date of Service: 01/23/2021 3:00 PM Medical Record Number:  [id number] Patient Account Number: [id number] Date of Birth/Sex: May 29, 1927 (87 y.o. F) Treating RN: Cornell Barman Primary Care Provider: Tracie Harrier Other Clinician: Jeanine Luz Referring Provider: Tracie Harrier Treating Provider/Extender: Tito Dine in Treatment: 7 Active Problems ICD-10 Encounter Code Description Active Date MDM Diagnosis I87.333 Chronic venous hypertension (idiopathic) with ulcer and inflammation of 12/05/2020 No Yes bilateral lower extremity L97.221 Non-pressure chronic ulcer of left calf limited to breakdown of skin 12/05/2020 No Yes L97.211 Non-pressure chronic ulcer of right calf limited to breakdown of skin 12/05/2020 No Yes M10.071 Idiopathic gout, right ankle and foot 01/09/2021 No Yes Inactive Problems Resolved Problems Electronic Signature(s) Signed: 01/23/2021 4:35:29 PM By: Linton Ham MD Entered By: Linton Ham on 01/23/2021 16:11:30 [name] [name] ([id number]) -------------------------------------------------------------------------------- Progress Note Details Patient Name: [name] [name] Date of Service: 01/23/2021 3:00 PM Medical Record Number: [id number] Patient Account Number: [id number] Date of Birth/Sex: Sep 23, 1926 (87 y.o. F) Treating RN: Cornell Barman Primary Care Provider: Tracie Harrier Other Clinician: Jeanine Luz Referring Provider: Tracie Harrier Treating Provider/Extender: Tito Dine in Treatment: 7 Subjective History of Present Illness (HPI) ADMISSION 12/05/2020 This is a 87 year old woman who arrived accompanied by her daughter from [place]. She has been dealing with wounds on her bilateral posterior calf since January. There is no obvious antecedent injury. Her daughter tells me that she is actually had wounds that have opened and closed on her legs since last summer. She currently she has 2 wounds on the right posterior calf and 1 on the left. They have been using Vaseline on  the wounds and covering the area with kerlix but they have not been healing. The patient has been wearing what appears to be support stockings. Past medical history includes paroxysmal A. fib, pacemaker,, hypertension, PVD, COPD, left mastectomy chronic lower extremity swelling ABIs were noncompressible bilateral 3/23; 1 week follow-up. Bilateral venous insufficiency ulcers right greater than left. As opposed to what I was worried about last week she actually tolerated 3 layer compression. We used Hydrofera Blue 3/30; bilateral venous insufficiency ulcers right greater than left. The area on the left is healed this week still 2 areas on the  posterior right calf that are both measuring smaller. We have been using Hydrofera Blue under 3 layer compression she is. She seems to be tolerating this 4/6; initially with bilateral venous insufficiency ulcers although the area on the left is healed. She has a juxta lite stocking here which was properly applied by her daughter. She still has 2 areas on the posterior calf. These have been difficult in terms of maintaining a surface we have been using Hydrofera Blue under 3 layer compression 4/13; patient initially came in with bilateral posterior calf venous insufficiency wounds. The area on the left are healed. She has a juxta lite stocking that she is using. She still has the 2 areas on the posterior calf. She said the pain in the larger area was so bad she took her wrap off 2 days ago. We have been using Hydrofera Blue 01/09/21 upon evaluation today patient appears to be doing well in regards to her wound. I did not see that per se today I saw the picture but she actually had to come back and do the fact that her toes were somewhat abnormally discolored and the nurse was concerned about it. The patient was kind enough to come back in later in the afternoon for me to have a look. The good news is that this does not appear to be as discolored but I am concerned  actually about the possibility of gout based on what I see. 4/27; the patient's right posterior calf wounds are both smaller. Still with slough which is adherent but did not generally better. She received 3 doses of colchicine last week for acute gout. She does not have a history of gout although this seems to have helped 5/4; right posterior calf wounds. Superior wound still with a nonviable surface inferior wound also. Using silver alginate Objective Constitutional Sitting or standing Blood Pressure is within target range for patient.. Pulse regular and within target range for patient.Marland Kitchen Respirations regular, non- labored and within target range.. Temperature is normal and within the target range for the patient.Marland Kitchen appears in no distress. Vitals Time Taken: 3:12 PM, Height: 63 in, Weight: 174 lbs, BMI: 30.8, Temperature: 97.9 F, Pulse: 73 bpm, Respiratory Rate: 16 breaths/min, Blood Pressure: 106/71 mmHg. General Notes: Wound exam; still debrided with a #3 curette removing adherent slough and subcutaneous tissue. The area distally is just about closed. Integumentary (Hair, Skin) Wound #1 status is Open. Original cause of wound was Gradually Appeared. The date acquired was: 09/30/2020. The wound has been in treatment 7 weeks. The wound is located on the Right,Proximal,Posterior Lower Leg. The wound measures 0.9cm length x 0.4cm width x 0.3cm depth; 0.283cm^2 area and 0.085cm^3 volume. There is Fat Layer (Subcutaneous Tissue) exposed. There is no tunneling or undermining noted. There is a medium amount of serosanguineous drainage noted. There is small (1-33%) red granulation within the wound bed. There is a large (67- 100%) amount of necrotic tissue within the wound bed including Eschar and Adherent Slough. LAELA, DEVINEY ([id number]) Wound #2 status is Open. Original cause of wound was Gradually Appeared. The date acquired was: 09/30/2020. The wound has been in treatment 7 weeks. The wound is  located on the Right,Distal,Posterior Lower Leg. The wound measures 0.4cm length x 0.2cm width x 0.1cm depth; 0.063cm^2 area and 0.006cm^3 volume. There is Fat Layer (Subcutaneous Tissue) exposed. There is no tunneling or undermining noted. There is a none present amount of drainage noted. There is a large (67-100%) amount of necrotic tissue within the  wound bed including Eschar. Assessment Active Problems ICD-10 Chronic venous hypertension (idiopathic) with ulcer and inflammation of bilateral lower extremity Non-pressure chronic ulcer of left calf limited to breakdown of skin Non-pressure chronic ulcer of right calf limited to breakdown of skin Idiopathic gout, right ankle and foot Procedures Wound #1 Pre-procedure diagnosis of Wound #1 is a Venous Leg Ulcer located on the Right,Proximal,Posterior Lower Leg .Severity of Tissue Pre Debridement is: Fat layer exposed. There was a Excisional Skin/Subcutaneous Tissue Debridement with a total area of 0.36 sq cm performed by Ricard Dillon, MD. With the following instrument(s): Curette to remove Viable and Non-Viable tissue/material. Material removed includes Subcutaneous Tissue and Slough and. No specimens were taken. A time out was conducted at 15:44, prior to the start of the procedure. A Minimum amount of bleeding was controlled with Pressure. The procedure was tolerated well. Post Debridement Measurements: 0.9cm length x 0.4cm width x 0.5cm depth; 0.141cm^3 volume. Character of Wound/Ulcer Post Debridement is stable. Severity of Tissue Post Debridement is: Fat layer exposed. Post procedure Diagnosis Wound #1: Same as Pre-Procedure Plan Follow-up Appointments: Return Appointment in 1 week. Bathing/ Shower/ Hygiene: May shower with wound dressing protected with water repellent cover or cast protector. Edema Control - Lymphedema / Segmental Compressive Device / Other: Optional: One layer of unna paste to top of compression wrap (to act as an  anchor). Elevate, Exercise Daily and Avoid Standing for Long Periods of Time. Elevate legs to the level of the heart and pump ankles as often as possible Elevate leg(s) parallel to the floor when sitting. Additional Orders / Instructions: Follow Nutritious Diet and Increase Protein Intake WOUND #1: - Lower Leg Wound Laterality: Right, Posterior, Proximal Cleanser: Soap and Water 1 x Per Week/15 Days Discharge Instructions: Gently cleanse wound with antibacterial soap, rinse and pat dry prior to dressing wounds Topical: Triamcinolone Acetonide Cream, 0.1%, 15 (g) tube (Generic) 1 x Per Week/15 Days Discharge Instructions: Apply as directed by provider. Primary Dressing: Prisma 4.34 (in) 1 x Per Week/15 Days Discharge Instructions: Moisten w/normal saline or sterile water; Cover wound as directed. Do not remove from wound bed. Secondary Dressing: Xtrasorb Medium 4x5 (in/in) 1 x Per Week/15 Days Discharge Instructions: Apply to wound as directed. Do not cut. Compression Wrap: Profore Lite LF 3 Multilayer Compression Bandaging System 1 x Per Week/15 Days Discharge Instructions: Apply 3 multi-layer wrap as prescribed. WOUND #2: - Lower Leg Wound Laterality: Right, Posterior, Distal Cleanser: Soap and Water 1 x Per Week/15 Days Discharge Instructions: Gently cleanse wound with antibacterial soap, rinse and pat dry prior to dressing wounds Topical: Triamcinolone Acetonide Cream, 0.1%, 15 (g) tube (Generic) 1 x Per Week/15 Days Discharge Instructions: Apply as directed by provider. Primary Dressing: Prisma 4.34 (in) 1 x Per Week/15 Days Discharge Instructions: Moisten w/normal saline or sterile water; Cover wound as directed. Do not remove from wound bed. Secondary Dressing: Xtrasorb Medium 4x5 (in/in) 1 x Per Week/15 Days Discharge Instructions: Apply to wound as directed. Do not cut. Compression Wrap: Profore Lite LF 3 Multilayer Compression Bandaging System 1 x Per Week/15 Days Discharge  Instructions: Apply 3 multi-layer wrap as prescribed. SANTOYA, HEMING BMarland Kitchen ([id number]) #1 new wound dressing with Prisma 2. I suspect the distal wound to be closed Hopefully less depth pressure on the proximal Electronic Signature(s) Signed: 01/23/2021 4:35:29 PM By: Linton Ham MD Entered By: Linton Ham on 01/23/2021 16:18:52 [name] [name] ([id number]) -------------------------------------------------------------------------------- SuperBill Details Patient Name: [name] [name]. Date of Service: 01/23/2021 Medical Record Number:  [id number] Patient Account Number: [id number] Date of Birth/Sex: 1927/09/17 (87 y.o. F) Treating RN: Cornell Barman Primary Care Provider: Tracie Harrier Other Clinician: Jeanine Luz Referring Provider: Tracie Harrier Treating Provider/Extender: Tito Dine in Treatment: 7 Diagnosis Coding ICD-10 Codes Code Description [phone] Chronic venous hypertension (idiopathic) with ulcer and inflammation of bilateral lower extremity L97.221 Non-pressure chronic ulcer of left calf limited to breakdown of skin L97.211 Non-pressure chronic ulcer of right calf limited to breakdown of skin M10.071 Idiopathic gout, right ankle and foot Facility Procedures CPT4 Code: [id number] Description: [id number] - DEB SUBQ TISSUE 20 SQ CM/< Modifier: Quantity: 1 CPT4 Code: Description: ICD-10 Diagnosis Description L97.211 Non-pressure chronic ulcer of right calf limited to breakdown of skin Modifier: Quantity: Physician Procedures CPT4 Code: [id number] Description: 11042 - WC PHYS SUBQ TISS 20 SQ CM Modifier: Quantity: 1 CPT4 Code: Description: ICD-10 Diagnosis Description L97.211 Non-pressure chronic ulcer of right calf limited to breakdown of skin Modifier: Quantity: Electronic Signature(s) Signed: 01/23/2021 4:35:29 PM By: Linton Ham MD Entered By: Linton Ham on 01/23/2021 16:19:21

## 2021-01-30 ENCOUNTER — Other Ambulatory Visit: Payer: Self-pay

## 2021-01-30 ENCOUNTER — Encounter: Payer: PPO | Admitting: Internal Medicine

## 2021-01-30 DIAGNOSIS — I87333 Chronic venous hypertension (idiopathic) with ulcer and inflammation of bilateral lower extremity: Secondary | ICD-10-CM | POA: Diagnosis not present

## 2021-01-30 DIAGNOSIS — L97212 Non-pressure chronic ulcer of right calf with fat layer exposed: Secondary | ICD-10-CM | POA: Diagnosis not present

## 2021-01-31 NOTE — Progress Notes (Signed)
[name] [name], [name] [name] ([id number]) Visit Report for 01/30/2021 HPI Details Patient Name: [name] [name], NEEB. Date of Service: 01/30/2021 9:30 AM Medical Record Number: [id number] Patient Account Number: [id number] Date of Birth/Sex: March 16, 1927 (87 y.o. F) Treating RN: Cornell Barman Primary Care Provider: Tracie Harrier Other Clinician: Jeanine Luz Referring Provider: Tracie Harrier Treating Provider/Extender: Tito Dine in Treatment: 8 History of Present Illness HPI Description: ADMISSION 12/05/2020 This is a 87 year old woman who arrived accompanied by her daughter from [place]. She has been dealing with wounds on her bilateral posterior calf since January. There is no obvious antecedent injury. Her daughter tells me that she is actually had wounds that have opened and closed on her legs since last summer. She currently she has 2 wounds on the right posterior calf and 1 on the left. They have been using Vaseline on the wounds and covering the area with kerlix but they have not been healing. The patient has been wearing what appears to be support stockings. Past medical history includes paroxysmal A. fib, pacemaker,, hypertension, PVD, COPD, left mastectomy chronic lower extremity swelling ABIs were noncompressible bilateral 3/23; 1 week follow-up. Bilateral venous insufficiency ulcers right greater than left. As opposed to what I was worried about last week she actually tolerated 3 layer compression. We used Hydrofera Blue 3/30; bilateral venous insufficiency ulcers right greater than left. The area on the left is healed this week still 2 areas on the posterior right calf that are both measuring smaller. We have been using Hydrofera Blue under 3 layer compression she is. She seems to be tolerating this 4/6; initially with bilateral venous insufficiency ulcers although the area on the left is healed. She has a juxta lite stocking here which was properly applied by her  daughter. She still has 2 areas on the posterior calf. These have been difficult in terms of maintaining a surface we have been using Hydrofera Blue under 3 layer compression 4/13; patient initially came in with bilateral posterior calf venous insufficiency wounds. The area on the left are healed. She has a juxta lite stocking that she is using. She still has the 2 areas on the posterior calf. She said the pain in the larger area was so bad she took her wrap off 2 days ago. We have been using Hydrofera Blue 01/09/21 upon evaluation today patient appears to be doing well in regards to her wound. I did not see that per se today I saw the picture but she actually had to come back and do the fact that her toes were somewhat abnormally discolored and the nurse was concerned about it. The patient was kind enough to come back in later in the afternoon for me to have a look. The good news is that this does not appear to be as discolored but I am concerned actually about the possibility of gout based on what I see. 4/27; the patient's right posterior calf wounds are both smaller. Still with slough which is adherent but did not generally better. She received 3 doses of colchicine last week for acute gout. She does not have a history of gout although this seems to have helped 5/4; right posterior calf wounds. Superior wound still with a nonviable surface inferior wound also. Using silver alginate 5/11; right posterior calf wounds. Wounds are considerably improved in surface area using silver alginate under compression. She has chronic venous insufficiency and she has stockings in the waiting Electronic Signature(s) Signed: 01/30/2021 4:52:48 PM By: Linton Ham MD Entered By:  Linton Ham on 01/30/2021 09:58:53 [name] [name] ([id number]) -------------------------------------------------------------------------------- Physical Exam Details Patient Name: [name] [name], PAMER. Date of Service: 01/30/2021 9:30  AM Medical Record Number: [id number] Patient Account Number: [id number] Date of Birth/Sex: [birth year] (87 y.o. F) Treating RN: Cornell Barman Primary Care Provider: Tracie Harrier Other Clinician: Jeanine Luz Referring Provider: Tracie Harrier Treating Provider/Extender: Tito Dine in Treatment: 8 Constitutional Patient is hypotensive. However looks well. Pulse regular and within target range for patient.Marland Kitchen Respirations regular, non-labored and within target range.. Temperature is normal and within the target range for the patient.Marland Kitchen appears in no distress. Respiratory Respiratory effort is easy and symmetric bilaterally. Rate is normal at rest and on room air.. Cardiovascular Pedal pulses are palpable on the right. Notes Wound exam; no debridement today. Dry flaking skin around the circumference washed off with saline and gauze. Wound surface looks clean Electronic Signature(s) Signed: 01/30/2021 4:52:48 PM By: Linton Ham MD Entered By: Linton Ham on 01/30/2021 10:00:11 [name] [name] ([id number]) -------------------------------------------------------------------------------- Physician Orders Details Patient Name: [name] [name] Date of Service: 01/30/2021 9:30 AM Medical Record Number: [id number] Patient Account Number: [id number] Date of Birth/Sex: 26-Nov-1926 (87 y.o. F) Treating RN: Cornell Barman Primary Care Provider: Tracie Harrier Other Clinician: Jeanine Luz Referring Provider: Tracie Harrier Treating Provider/Extender: Tito Dine in Treatment: 8 Verbal / Phone Orders: No Diagnosis Coding Follow-up Appointments o Return Appointment in 1 week. Bathing/ Shower/ Hygiene o May shower with wound dressing protected with water repellent cover or cast protector. Edema Control - Lymphedema / Segmental Compressive Device / Other Bilateral Lower Extremities o Optional: One layer of unna paste to top of compression wrap (to act as  an anchor). o Elevate, Exercise Daily and Avoid Standing for Long Periods of Time. o Elevate legs to the level of the heart and pump ankles as often as possible o Elevate leg(s) parallel to the floor when sitting. Additional Orders / Instructions o Follow Nutritious Diet and Increase Protein Intake Wound Treatment Wound #1 - Lower Leg Wound Laterality: Right, Posterior, Proximal Cleanser: Soap and Water 1 x Per Week/15 Days Discharge Instructions: Gently cleanse wound with antibacterial soap, rinse and pat dry prior to dressing wounds Topical: Triamcinolone Acetonide Cream, 0.1%, 15 (g) tube (Generic) 1 x Per Week/15 Days Discharge Instructions: Apply as directed by provider. Primary Dressing: Prisma 4.34 (in) 1 x Per Week/15 Days Discharge Instructions: Moisten w/normal saline or sterile water; Cover wound as directed. Do not remove from wound bed. Secondary Dressing: Xtrasorb Medium 4x5 (in/in) 1 x Per Week/15 Days Discharge Instructions: Apply to wound as directed. Do not cut. Compression Wrap: Profore Lite LF 3 Multilayer Compression Bandaging System 1 x Per Week/15 Days Discharge Instructions: Apply 3 multi-layer wrap as prescribed. Wound #2 - Lower Leg Wound Laterality: Right, Posterior, Distal Cleanser: Soap and Water 1 x Per Week/15 Days Discharge Instructions: Gently cleanse wound with antibacterial soap, rinse and pat dry prior to dressing wounds Topical: Triamcinolone Acetonide Cream, 0.1%, 15 (g) tube (Generic) 1 x Per Week/15 Days Discharge Instructions: Apply as directed by provider. Primary Dressing: Prisma 4.34 (in) 1 x Per Week/15 Days Discharge Instructions: Moisten w/normal saline or sterile water; Cover wound as directed. Do not remove from wound bed. Secondary Dressing: Xtrasorb Medium 4x5 (in/in) 1 x Per Week/15 Days Discharge Instructions: Apply to wound as directed. Do not cut. Compression Wrap: Profore Lite LF 3 Multilayer Compression Bandaging System 1 x  Per Week/15 Days Discharge Instructions: Apply 3 multi-layer wrap as prescribed. Electronic Signature(s)  Signed: 01/30/2021 4:52:48 PM By: Linton Ham MD Signed: 01/30/2021 6:06:01 PM By: Gretta Cool BSN, RN, CWS, Kim RN, BSN [place], Deborah Chalk ([id number]) Entered By: Gretta Cool, BSN, RN, CWS, Kim on 01/30/2021 09:53:57 [name] [name] ([id number]) -------------------------------------------------------------------------------- Problem List Details Patient Name: [name] [name], [name] [name] Date of Service: 01/30/2021 9:30 AM Medical Record Number: [id number] Patient Account Number: [id number] Date of Birth/Sex: 1927-07-23 (87 y.o. F) Treating RN: Cornell Barman Primary Care Provider: Tracie Harrier Other Clinician: Jeanine Luz Referring Provider: Tracie Harrier Treating Provider/Extender: Tito Dine in Treatment: 8 Active Problems ICD-10 Encounter Code Description Active Date MDM Diagnosis I87.333 Chronic venous hypertension (idiopathic) with ulcer and inflammation of 12/05/2020 No Yes bilateral lower extremity L97.221 Non-pressure chronic ulcer of left calf limited to breakdown of skin 12/05/2020 No Yes L97.211 Non-pressure chronic ulcer of right calf limited to breakdown of skin 12/05/2020 No Yes M10.071 Idiopathic gout, right ankle and foot 01/09/2021 No Yes Inactive Problems Resolved Problems Electronic Signature(s) Signed: 01/30/2021 4:52:48 PM By: Linton Ham MD Entered By: Linton Ham on 01/30/2021 09:55:42 [name] [name] ([id number]) -------------------------------------------------------------------------------- Progress Note Details Patient Name: [name] [name] Date of Service: 01/30/2021 9:30 AM Medical Record Number: [id number] Patient Account Number: [id number] Date of Birth/Sex: 1927/07/29 (87 y.o. F) Treating RN: Cornell Barman Primary Care Provider: Tracie Harrier Other Clinician: Jeanine Luz Referring Provider: Tracie Harrier Treating  Provider/Extender: Tito Dine in Treatment: 8 Subjective History of Present Illness (HPI) ADMISSION 12/05/2020 This is a 87 year old woman who arrived accompanied by her daughter from [place]. She has been dealing with wounds on her bilateral posterior calf since January. There is no obvious antecedent injury. Her daughter tells me that she is actually had wounds that have opened and closed on her legs since last summer. She currently she has 2 wounds on the right posterior calf and 1 on the left. They have been using Vaseline on the wounds and covering the area with kerlix but they have not been healing. The patient has been wearing what appears to be support stockings. Past medical history includes paroxysmal A. fib, pacemaker,, hypertension, PVD, COPD, left mastectomy chronic lower extremity swelling ABIs were noncompressible bilateral 3/23; 1 week follow-up. Bilateral venous insufficiency ulcers right greater than left. As opposed to what I was worried about last week she actually tolerated 3 layer compression. We used Hydrofera Blue 3/30; bilateral venous insufficiency ulcers right greater than left. The area on the left is healed this week still 2 areas on the posterior right calf that are both measuring smaller. We have been using Hydrofera Blue under 3 layer compression she is. She seems to be tolerating this 4/6; initially with bilateral venous insufficiency ulcers although the area on the left is healed. She has a juxta lite stocking here which was properly applied by her daughter. She still has 2 areas on the posterior calf. These have been difficult in terms of maintaining a surface we have been using Hydrofera Blue under 3 layer compression 4/13; patient initially came in with bilateral posterior calf venous insufficiency wounds. The area on the left are healed. She has a juxta lite stocking that she is using. She still has the 2 areas on the posterior calf. She  said the pain in the larger area was so bad she took her wrap off 2 days ago. We have been using Hydrofera Blue 01/09/21 upon evaluation today patient appears to be doing well in regards to her wound. I did not see that per se today I  saw the picture but she actually had to come back and do the fact that her toes were somewhat abnormally discolored and the nurse was concerned about it. The patient was kind enough to come back in later in the afternoon for me to have a look. The good news is that this does not appear to be as discolored but I am concerned actually about the possibility of gout based on what I see. 4/27; the patient's right posterior calf wounds are both smaller. Still with slough which is adherent but did not generally better. She received 3 doses of colchicine last week for acute gout. She does not have a history of gout although this seems to have helped 5/4; right posterior calf wounds. Superior wound still with a nonviable surface inferior wound also. Using silver alginate 5/11; right posterior calf wounds. Wounds are considerably improved in surface area using silver alginate under compression. She has chronic venous insufficiency and she has stockings in the waiting Objective Constitutional Patient is hypotensive. However looks well. Pulse regular and within target range for patient.Marland Kitchen Respirations regular, non-labored and within target range.. Temperature is normal and within the target range for the patient.Marland Kitchen appears in no distress. Vitals Time Taken: 9:31 AM, Height: 63 in, Weight: 174 lbs, BMI: 30.8, Temperature: 97.9 F, Pulse: 70 bpm, Respiratory Rate: 16 breaths/min, Blood Pressure: 90/60 mmHg. Respiratory Respiratory effort is easy and symmetric bilaterally. Rate is normal at rest and on room air.. Cardiovascular Pedal pulses are palpable on the right. General Notes: Wound exam; no debridement today. Dry flaking skin around the circumference washed off with saline  and gauze. Wound surface looks clean [name] [name], [name] B. ([id number]) Integumentary (Hair, Skin) Wound #1 status is Open. Original cause of wound was Gradually Appeared. The date acquired was: 09/30/2020. The wound has been in treatment 8 weeks. The wound is located on the Right,Proximal,Posterior Lower Leg. The wound measures 0.6cm length x 0.4cm width x 0.1cm depth; 0.188cm^2 area and 0.019cm^3 volume. There is Fat Layer (Subcutaneous Tissue) exposed. There is no tunneling or undermining noted. There is a medium amount of serosanguineous drainage noted. There is small (1-33%) red granulation within the wound bed. There is a large (67- 100%) amount of necrotic tissue within the wound bed including Eschar and Adherent Slough. Wound #2 status is Open. Original cause of wound was Gradually Appeared. The date acquired was: 09/30/2020. The wound has been in treatment 8 weeks. The wound is located on the Right,Distal,Posterior Lower Leg. The wound measures 0.2cm length x 0.2cm width x 0.1cm depth; 0.031cm^2 area and 0.003cm^3 volume. There is Fat Layer (Subcutaneous Tissue) exposed. There is no tunneling or undermining noted. There is a none present amount of drainage noted. There is a large (67-100%) amount of necrotic tissue within the wound bed including Eschar. Assessment Active Problems ICD-10 Chronic venous hypertension (idiopathic) with ulcer and inflammation of bilateral lower extremity Non-pressure chronic ulcer of left calf limited to breakdown of skin Non-pressure chronic ulcer of right calf limited to breakdown of skin Idiopathic gout, right ankle and foot Plan Follow-up Appointments: Return Appointment in 1 week. Bathing/ Shower/ Hygiene: May shower with wound dressing protected with water repellent cover or cast protector. Edema Control - Lymphedema / Segmental Compressive Device / Other: Optional: One layer of unna paste to top of compression wrap (to act as an anchor). Elevate, Exercise  Daily and Avoid Standing for Long Periods of Time. Elevate legs to the level of the heart and pump ankles as often as possible  Elevate leg(s) parallel to the floor when sitting. Additional Orders / Instructions: Follow Nutritious Diet and Increase Protein Intake WOUND #1: - Lower Leg Wound Laterality: Right, Posterior, Proximal Cleanser: Soap and Water 1 x Per Week/15 Days Discharge Instructions: Gently cleanse wound with antibacterial soap, rinse and pat dry prior to dressing wounds Topical: Triamcinolone Acetonide Cream, 0.1%, 15 (g) tube (Generic) 1 x Per Week/15 Days Discharge Instructions: Apply as directed by provider. Primary Dressing: Prisma 4.34 (in) 1 x Per Week/15 Days Discharge Instructions: Moisten w/normal saline or sterile water; Cover wound as directed. Do not remove from wound bed. Secondary Dressing: Xtrasorb Medium 4x5 (in/in) 1 x Per Week/15 Days Discharge Instructions: Apply to wound as directed. Do not cut. Compression Wrap: Profore Lite LF 3 Multilayer Compression Bandaging System 1 x Per Week/15 Days Discharge Instructions: Apply 3 multi-layer wrap as prescribed. WOUND #2: - Lower Leg Wound Laterality: Right, Posterior, Distal Cleanser: Soap and Water 1 x Per Week/15 Days Discharge Instructions: Gently cleanse wound with antibacterial soap, rinse and pat dry prior to dressing wounds Topical: Triamcinolone Acetonide Cream, 0.1%, 15 (g) tube (Generic) 1 x Per Week/15 Days Discharge Instructions: Apply as directed by provider. Primary Dressing: Prisma 4.34 (in) 1 x Per Week/15 Days Discharge Instructions: Moisten w/normal saline or sterile water; Cover wound as directed. Do not remove from wound bed. Secondary Dressing: Xtrasorb Medium 4x5 (in/in) 1 x Per Week/15 Days Discharge Instructions: Apply to wound as directed. Do not cut. Compression Wrap: Profore Lite LF 3 Multilayer Compression Bandaging System 1 x Per Week/15 Days Discharge Instructions: Apply 3  multi-layer wrap as prescribed. 1. Surface area continues to improve 2. Continue with silver collagen under 3 layer compression 3. She has compression stockings in the waiting. I think these were chronic venous insufficiency wounds. [name] [name], [name] [name] ([id number]) Electronic Signature(s) Signed: 01/30/2021 4:52:48 PM By: Linton Ham MD Entered By: Linton Ham on 01/30/2021 10:01:14 [name] [name] ([id number]) -------------------------------------------------------------------------------- SuperBill Details Patient Name: [name] [name] Date of Service: 01/30/2021 Medical Record Number: [id number] Patient Account Number: [id number] Date of Birth/Sex: 23-Jan-1927 (87 y.o. F) Treating RN: Cornell Barman Primary Care Provider: Tracie Harrier Other Clinician: Jeanine Luz Referring Provider: Tracie Harrier Treating Provider/Extender: Tito Dine in Treatment: 8 Diagnosis Coding ICD-10 Codes Code Description [phone] Chronic venous hypertension (idiopathic) with ulcer and inflammation of bilateral lower extremity L97.221 Non-pressure chronic ulcer of left calf limited to breakdown of skin L97.211 Non-pressure chronic ulcer of right calf limited to breakdown of skin M10.071 Idiopathic gout, right ankle and foot Facility Procedures CPT4 Code: [id number] Description: (Facility Use Only) [place] RT LEG Modifier: Quantity: 1 Physician Procedures CPT4 Code Description: [id number] - WC PHYS LEVEL 3 - EST PT Modifier: Quantity: 1 CPT4 Code Description: ICD-10 Diagnosis Description L97.221 Non-pressure chronic ulcer of left calf limited to breakdown of skin L97.211 Non-pressure chronic ulcer of right calf limited to breakdown of skin I87.333 Chronic venous hypertension  (idiopathic) with ulcer and inflammation of Modifier: bilateral lower extre Quantity: mity Electronic Signature(s) Signed: 01/30/2021 4:52:48 PM By: Linton Ham MD Entered  By: Linton Ham on 01/30/2021 10:01:36

## 2021-01-31 NOTE — Progress Notes (Signed)
[name] [name] ([id number]) Visit Report for 01/30/2021 Arrival Information Details Patient Name: [name] [name]. Date of Service: 01/30/2021 9:30 AM Medical Record Number: [id number] Patient Account Number: [id number] Date of Birth/Sex: [birth year] (87 y.o. F) Treating RN: [name] [name] Primary Care [name] [name]: [name] [name] Other Clinician: Jeanine [name] Referring [name] [name]: [name] [name] Treating [name] [name]/Extender: [name] [name] in Treatment: 8 Visit Information History Since Last Visit Added or deleted any medications: No Patient Arrived: Ambulatory Had a fall or experienced change in No Arrival Time: 09:29 activities of daily living that may affect Accompanied By: daughter risk of falls: Transfer Assistance: None Hospitalized since last visit: No Patient Identification Verified: Yes Has Dressing in Place as Prescribed: Yes Secondary Verification Process Completed: Yes Pain Present Now: No Patient Requires Transmission-Based No Precautions: Patient Has Alerts: Yes Patient Alerts: Patient on Blood Thinner warfarin NON COMPRESSIBLE L and R NOT DIABETIC Electronic Signature(s) Signed: 01/30/2021 12:22:13 PM By: [name] [name] Entered By: [name] [name] on 01/30/2021 09:32:22 [name] [name] ([id number]) -------------------------------------------------------------------------------- Encounter Discharge Information Details Patient Name: [name] [name] Date of Service: 01/30/2021 9:30 AM Medical Record Number: [id number] Patient Account Number: [id number] Date of Birth/Sex: Jan 20, 1927 (87 y.o. F) Treating RN: [name] [name] Primary Care Denece Shearer: [name] [name] Other Clinician: Jeanine [name] Referring [name] [name]: [name] [name] Treating [name] [name]/Extender: [name] [name] in Treatment: 8 Encounter Discharge Information Items Discharge Condition: Stable Ambulatory Status: Ambulatory Discharge Destination: Home Transportation: Private Auto Accompanied  By: daughter Schedule Follow-up Appointment: Yes Clinical Summary of Care: Electronic Signature(s) Signed: 01/30/2021 4:53:23 PM By: [name] [name] Entered By: [name] [name] on 01/30/2021 10:18:03 [name] [name] ([id number]) -------------------------------------------------------------------------------- Lower Extremity Assessment Details Patient Name: [name] [name] Date of Service: 01/30/2021 9:30 AM Medical Record Number: [id number] Patient Account Number: [id number] Date of Birth/Sex: 08/30/1927 (87 y.o. F) Treating RN: [name] [name] Primary Care [name] [name]: [name] [name] Other Clinician: Jeanine [name] Referring [name] [name]: [name] [name] Treating [name] [name]/Extender: [name] [name] in Treatment: 8 Edema Assessment Assessed: [Left: No] [Right: Yes] [Left: Edema] [Right: :] Calf Left: Right: Point of Measurement: 31 cm From Medial Instep 31.5 cm Ankle Left: Right: Point of Measurement: 11 cm From Medial Instep 19 cm Knee To Floor Left: Right: From Medial Instep 41 cm Vascular Assessment Pulses: Dorsalis Pedis Palpable: [Right:Yes] Electronic Signature(s) Signed: 01/30/2021 12:22:13 PM By: [name] [name] Entered ByDonnamarie [name] on 01/30/2021 09:43:29 [name] [name] ([id number]) -------------------------------------------------------------------------------- Multi Wound Chart Details Patient Name: [name] [name] Date of Service: 01/30/2021 9:30 AM Medical Record Number: [id number] Patient Account Number: [id number] Date of Birth/Sex: 12-16-1926 (87 y.o. F) Treating RN: [name] [name] Primary Care [name] [name]: [name] [name] Other Clinician: Jeanine [name] Referring [name] [name]: [name] [name] Treating [name] [name]/Extender: [name] [name] in Treatment: 8 Vital Signs Height(in): 61 Pulse(bpm): 70 Weight(lbs): 174 Blood Pressure(mmHg): 90/60 Body Mass Index(BMI): 31 Temperature(F): 97.9 Respiratory Rate(breaths/min): 16 Photos: [N/A:N/A] Wound  Location: Right, Proximal, Posterior Lower Right, Distal, Posterior Lower Leg N/A Leg Wounding Event: Gradually Appeared Gradually Appeared N/A Primary Etiology: Venous Leg Ulcer Venous Leg Ulcer N/A Comorbid History: Chronic Obstructive Pulmonary Chronic Obstructive Pulmonary N/A Disease (COPD), Arrhythmia, Disease (COPD), Arrhythmia, Congestive Heart Failure, Congestive Heart Failure, Hypertension, Received Hypertension, Received Chemotherapy Chemotherapy Date Acquired: 09/30/2020 09/30/2020 N/A Weeks of Treatment: 8 8 N/A Wound Status: Open Open N/A Measurements L x W x D (cm) 0.6x0.4x0.1 0.2x0.2x0.1 N/A Area (cm) : 0.188 0.031 N/A Volume (cm) : 0.019 0.003 N/A % Reduction in Area: 91.50% 94.40% N/A % Reduction in Volume: 91.40% 94.50% N/A Classification: Full Thickness  Without Exposed Full Thickness Without Exposed N/A Support Structures Support Structures Exudate Amount: Medium None Present N/A Exudate Type: Serosanguineous N/A N/A Exudate Color: red, brown N/A N/A Granulation Amount: Small (1-33%) N/A N/A Granulation Quality: Red N/A N/A Necrotic Amount: Large (67-100%) N/A N/A Necrotic Tissue: Eschar, Adherent Slough Eschar N/A Exposed Structures: Fat Layer (Subcutaneous Tissue): Fat Layer (Subcutaneous Tissue): N/A Yes Yes Fascia: No Fascia: No Tendon: No Tendon: No Muscle: No Muscle: No Joint: No Joint: No Bone: No Bone: No Epithelialization: Small (1-33%) N/A N/A Treatment Notes Wound #1 (Lower Leg) Wound Laterality: Right, Posterior, Proximal Cleanser Soap and Water [name] [name] ([id number]) Discharge Instruction: Gently cleanse wound with antibacterial soap, rinse and pat dry prior to dressing wounds Peri-Wound Care Topical Triamcinolone Acetonide Cream, 0.1%, 15 (g) tube Discharge Instruction: Apply as directed by [name] [name]. Primary Dressing Prisma 4.34 (in) Discharge Instruction: Moisten w/normal saline or sterile water; Cover wound as directed. Do  not remove from wound bed. Secondary Dressing Xtrasorb Medium 4x5 (in/in) Discharge Instruction: Apply to wound as directed. Do not cut. Secured With Compression Wrap Profore Lite LF 3 Multilayer Compression Bandaging System Discharge Instruction: Apply 3 multi-layer wrap as prescribed. Compression Stockings Add-Ons Wound #2 (Lower Leg) Wound Laterality: Right, Posterior, Distal Cleanser Soap and Water Discharge Instruction: Gently cleanse wound with antibacterial soap, rinse and pat dry prior to dressing wounds Peri-Wound Care Topical Triamcinolone Acetonide Cream, 0.1%, 15 (g) tube Discharge Instruction: Apply as directed by Schylar Allard. Primary Dressing Prisma 4.34 (in) Discharge Instruction: Moisten w/normal saline or sterile water; Cover wound as directed. Do not remove from wound bed. Secondary Dressing Xtrasorb Medium 4x5 (in/in) Discharge Instruction: Apply to wound as directed. Do not cut. Secured With Compression Wrap Profore Lite LF 3 Multilayer Compression Bandaging System Discharge Instruction: Apply 3 multi-layer wrap as prescribed. Compression Stockings Add-Ons Electronic Signature(s) Signed: 01/30/2021 4:52:48 PM By: Linton Ham MD Entered By: Linton Ham on 01/30/2021 09:55:51 [name] [name] ([id number]) -------------------------------------------------------------------------------- [place] Details Patient Name: UGOCHI, HENZLER. Date of Service: 01/30/2021 9:30 AM Medical Record Number: [id number] Patient Account Number: [id number] Date of Birth/Sex: 06/20/1927 (87 y.o. F) Treating RN: [name] [name] Primary Care Kelci Petrella: [name] [name] Other Clinician: Jeanine [name] Referring Leobardo Granlund: [name] [name] Treating Valbona Slabach/Extender: [name] [name] in Treatment: 8 Active Inactive Necrotic Tissue Nursing Diagnoses: Impaired tissue integrity related to necrotic/devitalized tissue Goals: Necrotic/devitalized tissue will  be minimized in the wound bed Date Initiated: 12/05/2020 Target Resolution Date: 12/19/2020 Goal Status: Active Interventions: Assess patient pain level pre-, during and post procedure and prior to discharge Treatment Activities: Apply topical anesthetic as ordered : 12/05/2020 Notes: Venous Leg Ulcer Nursing Diagnoses: Actual venous Insuffiency (use after diagnosis is confirmed) Goals: Patient will maintain optimal edema control Date Initiated: 12/05/2020 Target Resolution Date: 12/19/2020 Goal Status: Active Interventions: Assess peripheral edema status every visit. Compression as ordered Provide education on venous insufficiency Treatment Activities: Therapeutic compression applied : 12/05/2020 Notes: Wound/Skin Impairment Nursing Diagnoses: Impaired tissue integrity Goals: Patient/caregiver will verbalize understanding of skin care regimen Date Initiated: 12/05/2020 Target Resolution Date: 12/05/2020 Goal Status: Active Ulcer/skin breakdown will have a volume reduction of 30% by week 4 Date Initiated: 12/05/2020 Target Resolution Date: 01/05/2021 Goal Status: Active Interventions: Assess patient/caregiver ability to perform ulcer/skin care regimen upon admission and as needed Assess ulceration(s) every visit SINCLAIR, ARRAZOLA ([id number]) Treatment Activities: Skin care regimen initiated : 12/05/2020 Topical wound management initiated : 12/05/2020 Notes: Electronic Signature(s) Signed: 01/30/2021 6:06:01 PM By: Gretta Cool, BSN, RN, CWS, Kim RN,  BSN Entered By: Gretta Cool, BSN, RN, CWS, Kim on 01/30/2021 09:52:32 [name] [name]) -------------------------------------------------------------------------------- Pain Assessment Details Patient Name: TOLA, RIVEST. Date of Service: 01/30/2021 9:30 AM Medical Record Number: [id number] Patient Account Number: [id number] Date of Birth/Sex: [birth year] (87 y.o. F) Treating RN: [name] [name] Primary Care Raywood Wailes: [name] [name] Other Clinician: Jeanine [name] Referring Abrial Arrighi: [name] [name] Treating Masen Luallen/Extender: [name] [name] in Treatment: 8 Active Problems Location of Pain Severity and Description of Pain Patient Has Paino No Site Locations Rate the pain. Current Pain Level: 0 Pain Management and Medication Current Pain Management: Electronic Signature(s) Signed: 01/30/2021 12:22:13 PM By: [name] [name] Entered By: [name] [name] on 01/30/2021 09:39:48 [name] [name]) -------------------------------------------------------------------------------- Patient/Caregiver Education Details Patient Name: [name] [name] Date of Service: 01/30/2021 9:30 AM Medical Record Number: [id number] Patient Account Number: [id number] Date of Birth/Gender: [birth year] (87 y.o. F) Treating RN: [name] [name] Primary Care Physician: [name] [name] Other Clinician: Jeanine [name] Referring Physician: Tracie [name] Treating Physician/Extender: [name] [name] in Treatment: 8 Education Assessment Education Provided To: Patient Education Topics Provided Venous: Handouts: Controlling Swelling with Multilayered Compression Wraps Methods: Demonstration, Explain/Verbal Responses: State content correctly Wound/Skin Impairment: Handouts: Caring for Your Ulcer Methods: Demonstration, Explain/Verbal Responses: State content correctly Electronic Signature(s) Signed: 01/30/2021 6:06:01 PM By: Gretta Cool, BSN, RN, CWS, Kim RN, BSN Entered By: Gretta Cool, BSN, RN, CWS, Kim on 01/30/2021 09:54:51 [name] [name]) -------------------------------------------------------------------------------- Wound Assessment Details Patient Name: HOLLEN, LAWTER B. Date of Service: 01/30/2021 9:30 AM Medical Record Number: [id number] Patient Account Number: [id number] Date of Birth/Sex: 15-Jun-1927 (87 y.o. F) Treating RN: [name] [name] Primary Care Kelita Wallis: [name] [name] Other Clinician:  Jeanine [name] Referring Brodey Bonn: [name] [name] Treating Shadi Sessler/Extender: [name] [name] in Treatment: 8 Wound Status Wound Number: 1 Primary Venous Leg Ulcer Etiology: Wound Location: Right, Proximal, Posterior Lower Leg Wound Open Wounding Event: Gradually Appeared Status: Date Acquired: 09/30/2020 Comorbid Chronic Obstructive Pulmonary Disease (COPD), Weeks Of Treatment: 8 History: Arrhythmia, Congestive Heart Failure, Hypertension, Clustered Wound: No Received Chemotherapy Photos Wound Measurements Length: (cm) 0.6 Width: (cm) 0.4 Depth: (cm) 0.1 Area: (cm) 0.188 Volume: (cm) 0.019 % Reduction in Area: 91.5% % Reduction in Volume: 91.4% Epithelialization: Small (1-33%) Tunneling: No Undermining: No Wound Description Classification: Full Thickness Without Exposed Support Structures Exudate Amount: Medium Exudate Type: Serosanguineous Exudate Color: red, brown Foul Odor After Cleansing: No Slough/Fibrino Yes Wound Bed Granulation Amount: Small (1-33%) Exposed Structure Granulation Quality: Red Fascia Exposed: No Necrotic Amount: Large (67-100%) Fat Layer (Subcutaneous Tissue) Exposed: Yes Necrotic Quality: Eschar, Adherent Slough Tendon Exposed: No Muscle Exposed: No Joint Exposed: No Bone Exposed: No Treatment Notes Wound #1 (Lower Leg) Wound Laterality: Right, Posterior, Proximal Cleanser Soap and Water Discharge Instruction: Gently cleanse wound with antibacterial soap, rinse and pat dry prior to dressing wounds Peri-Wound Care LATRISA, ONSUREZ B. ([id number]) Topical Triamcinolone Acetonide Cream, 0.1%, 15 (g) tube Discharge Instruction: Apply as directed by Vincenzo Stave. Primary Dressing Prisma 4.34 (in) Discharge Instruction: Moisten w/normal saline or sterile water; Cover wound as directed. Do not remove from wound bed. Secondary Dressing Xtrasorb Medium 4x5 (in/in) Discharge Instruction: Apply to wound as directed. Do not cut. Secured  With Compression Wrap Profore Lite LF 3 Multilayer Compression Bandaging System Discharge Instruction: Apply 3 multi-layer wrap as prescribed. Compression Stockings Add-Ons Electronic Signature(s) Signed: 01/30/2021 12:22:13 PM By: [name] [name] Entered By: [name] [name] on 01/30/2021 09:41:18 [name] [name]) -------------------------------------------------------------------------------- Wound Assessment Details Patient Name: [name] [name] Date of Service: 01/30/2021 9:30  AM Medical Record Number: [id number] Patient Account Number: [id number] Date of Birth/Sex: 01-12-1927 (87 y.o. F) Treating RN: [name] [name] Primary Care Vinita Prentiss: [name] [name] Other Clinician: Jeanine [name] Referring Kesi Perrow: [name] [name] Treating Amish Mintzer/Extender: [name] [name] in Treatment: 8 Wound Status Wound Number: 2 Primary Venous Leg Ulcer Etiology: Wound Location: Right, Distal, Posterior Lower Leg Wound Open Wounding Event: Gradually Appeared Status: Date Acquired: 09/30/2020 Comorbid Chronic Obstructive Pulmonary Disease (COPD), Weeks Of Treatment: 8 History: Arrhythmia, Congestive Heart Failure, Hypertension, Clustered Wound: No Received Chemotherapy Photos Wound Measurements Length: (cm) 0.2 Width: (cm) 0.2 Depth: (cm) 0.1 Area: (cm) 0.031 Volume: (cm) 0.003 % Reduction in Area: 94.4% % Reduction in Volume: 94.5% Tunneling: No Undermining: No Wound Description Classification: Full Thickness Without Exposed Support Structures Exudate Amount: None Present Foul Odor After Cleansing: No Slough/Fibrino No Wound Bed Necrotic Amount: Large (67-100%) Exposed Structure Necrotic Quality: Eschar Fascia Exposed: No Fat Layer (Subcutaneous Tissue) Exposed: Yes Tendon Exposed: No Muscle Exposed: No Joint Exposed: No Bone Exposed: No Treatment Notes Wound #2 (Lower Leg) Wound Laterality: Right, Posterior, Distal Cleanser Soap and Water Discharge  Instruction: Gently cleanse wound with antibacterial soap, rinse and pat dry prior to dressing wounds Peri-Wound Care Topical MYREL, RAPPLEYE B. ([id number]) Triamcinolone Acetonide Cream, 0.1%, 15 (g) tube Discharge Instruction: Apply as directed by Denese Mentink. Primary Dressing Prisma 4.34 (in) Discharge Instruction: Moisten w/normal saline or sterile water; Cover wound as directed. Do not remove from wound bed. Secondary Dressing Xtrasorb Medium 4x5 (in/in) Discharge Instruction: Apply to wound as directed. Do not cut. Secured With Compression Wrap Profore Lite LF 3 Multilayer Compression Bandaging System Discharge Instruction: Apply 3 multi-layer wrap as prescribed. Compression Stockings Add-Ons Electronic Signature(s) Signed: 01/30/2021 12:22:13 PM By: [name] [name] Entered By: [name] [name] on 01/30/2021 09:41:52 [name] [name] ([id number]) -------------------------------------------------------------------------------- Vitals Details Patient Name: [name] [name] Date of Service: 01/30/2021 9:30 AM Medical Record Number: [id number] Patient Account Number: [id number] Date of Birth/Sex: 1927-06-07 (87 y.o. F) Treating RN: [name] [name] Primary Care Skylar Flynt: [name] [name] Other Clinician: Jeanine [name] Referring Willena Jeancharles: [name] [name] Treating Demira Gwynne/Extender: [name] [name] in Treatment: 8 Vital Signs Time Taken: 09:31 Temperature (F): 97.9 Height (in): 63 Pulse (bpm): 70 Weight (lbs): 174 Respiratory Rate (breaths/min): 16 Body Mass Index (BMI): 30.8 Blood Pressure (mmHg): 90/60 Reference Range: 80 - 120 mg / dl Electronic Signature(s) Signed: 01/30/2021 12:22:13 PM By: [name] [name] Entered ByDonnamarie [name] on 01/30/2021 09:39:36

## 2021-02-06 ENCOUNTER — Encounter: Payer: PPO | Admitting: Internal Medicine

## 2021-02-06 ENCOUNTER — Other Ambulatory Visit: Payer: Self-pay

## 2021-02-06 DIAGNOSIS — L97212 Non-pressure chronic ulcer of right calf with fat layer exposed: Secondary | ICD-10-CM | POA: Diagnosis not present

## 2021-02-06 DIAGNOSIS — I87331 Chronic venous hypertension (idiopathic) with ulcer and inflammation of right lower extremity: Secondary | ICD-10-CM | POA: Diagnosis not present

## 2021-02-06 DIAGNOSIS — I87333 Chronic venous hypertension (idiopathic) with ulcer and inflammation of bilateral lower extremity: Secondary | ICD-10-CM | POA: Diagnosis not present

## 2021-02-07 NOTE — Progress Notes (Signed)
MALISHA, MABEY ([id number]) Visit Report for 02/06/2021 Arrival Information Details Patient Name: [name] [name], [name]. Date of Service: 02/06/2021 9:00 AM Medical Record Number: [id number] Patient Account Number: [id number] Date of Birth/Sex: 05-26-1927 (87 y.o. F) Treating RN: Donnamarie Poag Primary Care Dwaine Pringle: Tracie Harrier Other Clinician: Jeanine Luz Referring Edra Riccardi: Tracie Harrier Treating Evangelyne Loja/Extender: Tito Dine in Treatment: 9 Visit Information History Since Last Visit Added or deleted any medications: No Patient Arrived: Kasandra Knudsen Had a fall or experienced change in No Arrival Time: 09:00 activities of daily living that may affect Accompanied By: daughter risk of falls: Transfer Assistance: EasyPivot Patient Lift Hospitalized since last visit: No Patient Identification Verified: Yes Has Dressing in Place as Prescribed: Yes Patient Requires Transmission-Based No Has Compression in Place as Prescribed: Yes Precautions: Pain Present Now: No Patient Has Alerts: Yes Patient Alerts: Patient on Blood Thinner warfarin NON COMPRESSIBLE L and R NOT DIABETIC Electronic Signature(s) Signed: 02/07/2021 8:26:01 AM By: Donnamarie Poag Entered By: Donnamarie Poag on 02/06/2021 09:07:51 [name] [name] [name] ([id number]) -------------------------------------------------------------------------------- Clinic Level of Care Assessment Details Patient Name: [name] [name] [name] Date of Service: 02/06/2021 9:00 AM Medical Record Number: [id number] Patient Account Number: [id number] Date of Birth/Sex: 26-Apr-1927 (87 y.o. F) Treating RN: Dolan Amen Primary Care Elli Groesbeck: Tracie Harrier Other Clinician: Jeanine Luz Referring Waqas Bruhl: Tracie Harrier Treating Arianah Torgeson/Extender: Tito Dine in Treatment: 9 Clinic Level of Care Assessment Items TOOL 1 Quantity Score []  - Use when EandM and Procedure is performed on INITIAL visit 0 ASSESSMENTS - Nursing  Assessment / Reassessment []  - General Physical Exam (combine w/ comprehensive assessment (listed just below) when performed on new 0 pt. evals) []  - 0 Comprehensive Assessment (HX, ROS, Risk Assessments, Wounds Hx, etc.) ASSESSMENTS - Wound and Skin Assessment / Reassessment []  - Dermatologic / Skin Assessment (not related to wound area) 0 ASSESSMENTS - Ostomy and/or Continence Assessment and Care []  - Incontinence Assessment and Management 0 []  - 0 Ostomy Care Assessment and Management (repouching, etc.) PROCESS - Coordination of Care []  - Simple Patient / Family Education for ongoing care 0 []  - 0 Complex (extensive) Patient / Family Education for ongoing care []  - 0 Staff obtains Programmer, systems, Records, Test Results / Process Orders []  - 0 Staff telephones HHA, Nursing Homes / Clarify orders / etc []  - 0 Routine Transfer to another Facility (non-emergent condition) []  - 0 Routine Hospital Admission (non-emergent condition) []  - 0 New Admissions / Biomedical engineer / Ordering NPWT, Apligraf, etc. []  - 0 Emergency Hospital Admission (emergent condition) PROCESS - Special Needs []  - Pediatric / Minor Patient Management 0 []  - 0 Isolation Patient Management []  - 0 Hearing / Language / Visual special needs []  - 0 Assessment of Community assistance (transportation, D/C planning, etc.) []  - 0 Additional assistance / Altered mentation []  - 0 Support Surface(s) Assessment (bed, cushion, seat, etc.) INTERVENTIONS - Miscellaneous []  - External ear exam 0 []  - 0 Patient Transfer (multiple staff / Civil Service fast streamer / Similar devices) []  - 0 Simple Staple / Suture removal (25 or less) []  - 0 Complex Staple / Suture removal (26 or more) []  - 0 Hypo/Hyperglycemic Management (do not check if billed separately) []  - 0 Ankle / Brachial Index (ABI) - do not check if billed separately Has the patient been seen at the hospital within the last three years: Yes Total Score: 0 Level Of  Care: ____ [name] [name] [name] ([id number]) Electronic Signature(s) Signed: 02/06/2021 12:48:14 PM By: Georges Mouse, Minus Breeding RN Entered By:  Georges Mouse, Kenia on 02/06/2021 09:21:50 [name], [name] [name] ([id number]) -------------------------------------------------------------------------------- Compression Therapy Details Patient Name: [name] [name], [name]. Date of Service: 02/06/2021 9:00 AM Medical Record Number: [id number] Patient Account Number: [id number] Date of Birth/Sex: 06-25-1927 (87 y.o. F) Treating RN: Dolan Amen Primary Care Joby Hershkowitz: Tracie Harrier Other Clinician: Jeanine Luz Referring Lovell Nuttall: Tracie Harrier Treating Lubertha Leite/Extender: Tito Dine in Treatment: 9 Compression Therapy Performed for Wound Assessment: Wound #1 Right,Proximal,Posterior Lower Leg Performed By: Cora Daniels, RN Compression Type: Three Layer Post Procedure Diagnosis Same as Pre-procedure Notes pt tolerating wrap well Electronic Signature(s) Signed: 02/06/2021 12:48:14 PM By: Georges Mouse, Minus Breeding RN Entered By: Georges Mouse, Minus Breeding on 02/06/2021 09:21:17 [name] [name] [name] ([id number]) -------------------------------------------------------------------------------- Encounter Discharge Information Details Patient Name: [name] [name] [name] Date of Service: 02/06/2021 9:00 AM Medical Record Number: [id number] Patient Account Number: [id number] Date of Birth/Sex: Oct 10, 1926 (87 y.o. F) Treating RN: Cornell Barman Primary Care Kyng Matlock: Tracie Harrier Other Clinician: Jeanine Luz Referring Ramey Schiff: Tracie Harrier Treating Malarie Tappen/Extender: Tito Dine in Treatment: 9 Encounter Discharge Information Items Discharge Condition: Stable Ambulatory Status: Cane Discharge Destination: Home Transportation: Private Auto Accompanied By: daughter Schedule Follow-up Appointment: Yes Clinical Summary of Care: Electronic Signature(s) Signed: 02/07/2021  5:05:23 PM By: Jeanine Luz Entered By: Jeanine Luz on 02/06/2021 [place], [place]. ([id number]) -------------------------------------------------------------------------------- Lower Extremity Assessment Details Patient Name: [name] [name] [name] Date of Service: 02/06/2021 9:00 AM Medical Record Number: [id number] Patient Account Number: [id number] Date of Birth/Sex: 08/22/1927 (87 y.o. F) Treating RN: Donnamarie Poag Primary Care Kalyn Hofstra: Tracie Harrier Other Clinician: Jeanine Luz Referring Logan Vegh: Tracie Harrier Treating Aarian Cleaver/Extender: Ricard Dillon Weeks in Treatment: 9 Edema Assessment Assessed: [Left: No] [Right: Yes] [Left: Edema] [Right: :] Calf Left: Right: Point of Measurement: 31 cm From Medial Instep 31.5 cm Ankle Left: Right: Point of Measurement: 11 cm From Medial Instep 19 cm Vascular Assessment Pulses: Dorsalis Pedis Palpable: [Right:Yes] Electronic Signature(s) Signed: 02/07/2021 8:26:01 AM By: Donnamarie Poag Entered By: Donnamarie Poag on 02/06/2021 09:11:00 [name] [name] [name] ([id number]) -------------------------------------------------------------------------------- Multi Wound Chart Details Patient Name: [name] [name] [name] Date of Service: 02/06/2021 9:00 AM Medical Record Number: [id number] Patient Account Number: [id number] Date of Birth/Sex: 1926/09/28 (87 y.o. F) Treating RN: Dolan Amen Primary Care Chalice Philbert: Tracie Harrier Other Clinician: Jeanine Luz Referring Sherene Plancarte: Tracie Harrier Treating Lucero Auzenne/Extender: Tito Dine in Treatment: 9 Vital Signs Height(in): 73 Pulse(bpm): 72 Weight(lbs): 174 Blood Pressure(mmHg): 109/78 Body Mass Index(BMI): 31 Temperature(F): 98.4 Respiratory Rate(breaths/min): 20 Photos: [N/A:N/A] Wound Location: Right, Proximal, Posterior Lower Right, Distal, Posterior Lower Leg N/A Leg Wounding Event: Gradually Appeared Gradually Appeared N/A Primary Etiology: Venous Leg  Ulcer Venous Leg Ulcer N/A Comorbid History: Chronic Obstructive Pulmonary Chronic Obstructive Pulmonary N/A Disease (COPD), Arrhythmia, Disease (COPD), Arrhythmia, Congestive Heart Failure, Congestive Heart Failure, Hypertension, Received Hypertension, Received Chemotherapy Chemotherapy Date Acquired: 09/30/2020 09/30/2020 N/A Weeks of Treatment: 9 9 N/A Wound Status: Open Healed - Epithelialized N/A Measurements L x W x D (cm) 0.4x0.3x0.1 0x0x0 N/A Area (cm) : 0.094 0 N/A Volume (cm) : 0.009 0 N/A % Reduction in Area: 95.70% 100.00% N/A % Reduction in Volume: 95.90% 100.00% N/A Classification: Full Thickness Without Exposed Full Thickness Without Exposed N/A Support Structures Support Structures Exudate Amount: Small None Present N/A Exudate Type: Serosanguineous N/A N/A Exudate Color: red, brown N/A N/A Granulation Amount: Small (1-33%) None Present (0%) N/A Granulation Quality: Red N/A N/A Necrotic Amount: Large (67-100%) None Present (0%) N/A Necrotic Tissue: Eschar N/A N/A Exposed Structures: Fat Layer (Subcutaneous Tissue):  Fascia: No N/A Yes Fat Layer (Subcutaneous Tissue): Fascia: No No Tendon: No Tendon: No Muscle: No Muscle: No Joint: No Joint: No Bone: No Bone: No Epithelialization: Large (67-100%) Large (67-100%) N/A Procedures Performed: Compression Therapy N/A N/A Treatment Notes Electronic Signature(s) Signed: 02/06/2021 5:17:14 PM By: Linton Ham MD [name] [name] [name] ([id number]) Entered By: Linton Ham on 02/06/2021 09:22:17 [name] [name] [name] ([id number]) -------------------------------------------------------------------------------- Pratt Details Patient Name: [name] [name], [name]. Date of Service: 02/06/2021 9:00 AM Medical Record Number: [id number] Patient Account Number: [id number] Date of Birth/Sex: July 08, 1927 (87 y.o. F) Treating RN: Dolan Amen Primary Care Ziggy Reveles: Tracie Harrier Other Clinician: Jeanine Luz Referring Jonathan Corpus: Tracie Harrier Treating Jaeden Messer/Extender: Tito Dine in Treatment: 9 Active Inactive Necrotic Tissue Nursing Diagnoses: Impaired tissue integrity related to necrotic/devitalized tissue Goals: Necrotic/devitalized tissue will be minimized in the wound bed Date Initiated: 12/05/2020 Target Resolution Date: 12/19/2020 Goal Status: Active Interventions: Assess patient pain level pre-, during and post procedure and prior to discharge Treatment Activities: Apply topical anesthetic as ordered : 12/05/2020 Notes: Venous Leg Ulcer Nursing Diagnoses: Actual venous Insuffiency (use after diagnosis is confirmed) Goals: Patient will maintain optimal edema control Date Initiated: 12/05/2020 Target Resolution Date: 12/19/2020 Goal Status: Active Interventions: Assess peripheral edema status every visit. Compression as ordered Provide education on venous insufficiency Treatment Activities: Therapeutic compression applied : 12/05/2020 Notes: Electronic Signature(s) Signed: 02/06/2021 12:48:14 PM By: Georges Mouse, Minus Breeding RN Entered By: Georges Mouse, Kenia on 02/06/2021 09:17:53 [name] [name] [name] ([id number]) -------------------------------------------------------------------------------- Pain Assessment Details Patient Name: [name] [name] [name] Date of Service: 02/06/2021 9:00 AM Medical Record Number: [id number] Patient Account Number: [id number] Date of Birth/Sex: [birth year] (87 y.o. F) Treating RN: Donnamarie Poag Primary Care Anton Cheramie: Tracie Harrier Other Clinician: Jeanine Luz Referring Cris Talavera: Tracie Harrier Treating Joleene Burnham/Extender: Tito Dine in Treatment: 9 Active Problems Location of Pain Severity and Description of Pain Patient Has Paino No Site Locations Rate the pain. Current Pain Level: 0 Pain Management and Medication Current Pain Management: Electronic Signature(s) Signed: 02/07/2021 8:26:01 AM By: Donnamarie Poag Entered By: Donnamarie Poag on 02/06/2021 94:17:40 [name] [name] [name] ([id number]) -------------------------------------------------------------------------------- Patient/Caregiver Education Details Patient Name: [name] [name] [name] Date of Service: 02/06/2021 9:00 AM Medical Record Number: [id number] Patient Account Number: [id number] Date of Birth/Gender: 01-19-1927 (87 y.o. F) Treating RN: Dolan Amen Primary Care Physician: Tracie Harrier Other Clinician: Jeanine Luz Referring Physician: Tracie Harrier Treating Physician/Extender: Tito Dine in Treatment: 9 Education Assessment Education Provided To: Patient Education Topics Provided Wound/Skin Impairment: Methods: Explain/Verbal Responses: State content correctly Electronic Signature(s) Signed: 02/06/2021 12:48:14 PM By: Georges Mouse, Minus Breeding RN Entered By: Georges Mouse, Minus Breeding on 02/06/2021 09:22:06 [name] [name] [name] ([id number]) -------------------------------------------------------------------------------- Wound Assessment Details Patient Name: [name] [name] [name] Date of Service: 02/06/2021 9:00 AM Medical Record Number: [id number] Patient Account Number: [id number] Date of Birth/Sex: Jan 29, 1927 (87 y.o. F) Treating RN: Donnamarie Poag Primary Care Magda Muise: Tracie Harrier Other Clinician: Jeanine Luz Referring Gary Gabrielsen: Tracie Harrier Treating Dublin Cantero/Extender: Tito Dine in Treatment: 9 Wound Status Wound Number: 1 Primary Venous Leg Ulcer Etiology: Wound Location: Right, Proximal, Posterior Lower Leg Wound Open Wounding Event: Gradually Appeared Status: Date Acquired: 09/30/2020 Comorbid Chronic Obstructive Pulmonary Disease (COPD), Weeks Of Treatment: 9 History: Arrhythmia, Congestive Heart Failure, Hypertension, Clustered Wound: No Received Chemotherapy Photos Wound Measurements Length: (cm) 0.4 Width: (cm) 0.3 Depth: (cm) 0.1 Area: (cm) 0.094 Volume: (cm) 0.009 %  Reduction in Area: 95.7% % Reduction in Volume: 95.9% Epithelialization: Large (67-100%) Tunneling: No Undermining: No  Wound Description Classification: Full Thickness Without Exposed Support Structures Exudate Amount: Small Exudate Type: Serosanguineous Exudate Color: red, brown Foul Odor After Cleansing: No Slough/Fibrino Yes Wound Bed Granulation Amount: Small (1-33%) Exposed Structure Granulation Quality: Red Fascia Exposed: No Necrotic Amount: Large (67-100%) Fat Layer (Subcutaneous Tissue) Exposed: Yes Necrotic Quality: Eschar Tendon Exposed: No Muscle Exposed: No Joint Exposed: No Bone Exposed: No Treatment Notes Wound #1 (Lower Leg) Wound Laterality: Right, Posterior, Proximal Cleanser Soap and Water Discharge Instruction: Gently cleanse wound with antibacterial soap, rinse and pat dry prior to dressing wounds Peri-Wound Care [name] [name], [name] ([id number]) Topical Triamcinolone Acetonide Cream, 0.1%, 15 (g) tube Discharge Instruction: Apply as directed by Tiombe Tomeo. Primary Dressing Prisma 4.34 (in) Discharge Instruction: Moisten w/normal saline or sterile water; Cover wound as directed. Do not remove from wound bed. Secondary Dressing Xtrasorb Medium 4x5 (in/in) Discharge Instruction: Apply to wound as directed. Do not cut. Secured With Compression Wrap Profore Lite LF 3 Multilayer Compression Bandaging System Discharge Instruction: Apply 3 multi-layer wrap as prescribed. Compression Stockings Add-Ons Electronic Signature(s) Signed: 02/07/2021 8:26:01 AM By: Donnamarie Poag Entered By: Donnamarie Poag on 02/06/2021 09:10:14 [name] [name] [name] ([id number]) -------------------------------------------------------------------------------- Wound Assessment Details Patient Name: [name] [name] [name] Date of Service: 02/06/2021 9:00 AM Medical Record Number: [id number] Patient Account Number: [id number] Date of Birth/Sex: 10/22/1926 (87 y.o. F) Treating RN: Donnamarie Poag Primary Care  Emaley Applin: Tracie Harrier Other Clinician: Jeanine Luz Referring Lasalle Abee: Tracie Harrier Treating Aviyah Swetz/Extender: Tito Dine in Treatment: 9 Wound Status Wound Number: 2 Primary Venous Leg Ulcer Etiology: Wound Location: Right, Distal, Posterior Lower Leg Wound Healed - Epithelialized Wounding Event: Gradually Appeared Status: Date Acquired: 09/30/2020 Comorbid Chronic Obstructive Pulmonary Disease (COPD), Weeks Of Treatment: 9 History: Arrhythmia, Congestive Heart Failure, Hypertension, Clustered Wound: No Received Chemotherapy Photos Wound Measurements Length: (cm) 0 Width: (cm) 0 Depth: (cm) 0 Area: (cm) 0 Volume: (cm) 0 % Reduction in Area: 100% % Reduction in Volume: 100% Epithelialization: Large (67-100%) Tunneling: No Undermining: No Wound Description Classification: Full Thickness Without Exposed Support Structures Exudate Amount: None Present Foul Odor After Cleansing: No Slough/Fibrino No Wound Bed Granulation Amount: None Present (0%) Exposed Structure Necrotic Amount: None Present (0%) Fascia Exposed: No Fat Layer (Subcutaneous Tissue) Exposed: No Tendon Exposed: No Muscle Exposed: No Joint Exposed: No Bone Exposed: No Electronic Signature(s) Signed: 02/06/2021 12:48:14 PM By: Georges Mouse, Minus Breeding RN Signed: 02/07/2021 8:26:01 AM By: Donnamarie Poag Entered By: Georges Mouse, Minus Breeding on 02/06/2021 09:20:29 [name] [name] [name] ([id number]) -------------------------------------------------------------------------------- Vitals Details Patient Name: [name] [name] Osmond B. Date of Service: 02/06/2021 9:00 AM Medical Record Number: [id number] Patient Account Number: [id number] Date of Birth/Sex: Apr 24, 1927 (87 y.o. F) Treating RN: Donnamarie Poag Primary Care Dorn Hartshorne: Tracie Harrier Other Clinician: Jeanine Luz Referring Monic Engelmann: Tracie Harrier Treating Cinde Ebert/Extender: Tito Dine in Treatment: 9 Vital Signs Time  Taken: 09:01 Temperature (F): 98.4 Height (in): 63 Pulse (bpm): 79 Weight (lbs): 174 Respiratory Rate (breaths/min): 20 Body Mass Index (BMI): 30.8 Blood Pressure (mmHg): 109/78 Reference Range: 80 - 120 mg / dl Electronic Signature(s) Signed: 02/07/2021 8:26:01 AM By: Donnamarie Poag Entered ByDonnamarie Poag on 02/06/2021 09:08:17

## 2021-02-07 NOTE — Progress Notes (Signed)
SULI, DANEK ([id number]) Visit Report for 02/06/2021 HPI Details Patient Name: [name] [name], [name] [name]. Date of Service: 02/06/2021 9:00 AM Medical Record Number: [id number] Patient Account Number: [id number] Date of Birth/Sex: Mar 26, 1927 (87 y.o. F) Treating RN: Cornell Barman Primary Care Provider: Tracie Harrier Other Clinician: Jeanine Luz Referring Provider: Tracie Harrier Treating Provider/Extender: Tito Dine in Treatment: 9 History of Present Illness HPI Description: ADMISSION 12/05/2020 This is a 87 year old woman who arrived accompanied by her daughter from [place]. She has been dealing with wounds on her bilateral posterior calf since January. There is no obvious antecedent injury. Her daughter tells me that she is actually had wounds that have opened and closed on her legs since last summer. She currently she has 2 wounds on the right posterior calf and 1 on the left. They have been using Vaseline on the wounds and covering the area with kerlix but they have not been healing. The patient has been wearing what appears to be support stockings. Past medical history includes paroxysmal A. fib, pacemaker,, hypertension, PVD, COPD, left mastectomy chronic lower extremity swelling ABIs were noncompressible bilateral 3/23; 1 week follow-up. Bilateral venous insufficiency ulcers right greater than left. As opposed to what I was worried about last week she actually tolerated 3 layer compression. We used Hydrofera Blue 3/30; bilateral venous insufficiency ulcers right greater than left. The area on the left is healed this week still 2 areas on the posterior right calf that are both measuring smaller. We have been using Hydrofera Blue under 3 layer compression she is. She seems to be tolerating this 4/6; initially with bilateral venous insufficiency ulcers although the area on the left is healed. She has a juxta lite stocking here which was properly applied by her  daughter. She still has 2 areas on the posterior calf. These have been difficult in terms of maintaining a surface we have been using Hydrofera Blue under 3 layer compression 4/13; patient initially came in with bilateral posterior calf venous insufficiency wounds. The area on the left are healed. She has a juxta lite stocking that she is using. She still has the 2 areas on the posterior calf. She said the pain in the larger area was so bad she took her wrap off 2 days ago. We have been using Hydrofera Blue 01/09/21 upon evaluation today patient appears to be doing well in regards to her wound. I did not see that per se today I saw the picture but she actually had to come back and do the fact that her toes were somewhat abnormally discolored and the nurse was concerned about it. The patient was kind enough to come back in later in the afternoon for me to have a look. The good news is that this does not appear to be as discolored but I am concerned actually about the possibility of gout based on what I see. 4/27; the patient's right posterior calf wounds are both smaller. Still with slough which is adherent but did not generally better. She received 3 doses of colchicine last week for acute gout. She does not have a history of gout although this seems to have helped 5/4; right posterior calf wounds. Superior wound still with a nonviable surface inferior wound also. Using silver alginate 5/11; right posterior calf wounds. Wounds are considerably improved in surface area using silver alginate under compression. She has chronic venous insufficiency and she has stockings in the waiting 5/18; right posterior calf wounds which are venous. She had 2 wounds  here the more distal one is closed. Dry skin over the surface of the other with eschar. Electronic Signature(s) Signed: 02/06/2021 5:17:14 PM By: Linton Ham MD Entered By: Linton Ham on 02/06/2021 09:22:55 [name] [name]  ([id number]) -------------------------------------------------------------------------------- Physical Exam Details Patient Name: [name] [name], [name] [name] Date of Service: 02/06/2021 9:00 AM Medical Record Number: [id number] Patient Account Number: [id number] Date of Birth/Sex: 04/04/1927 (87 y.o. F) Treating RN: Cornell Barman Primary Care Provider: Tracie Harrier Other Clinician: Jeanine Luz Referring Provider: Tracie Harrier Treating Provider/Extender: Tito Dine in Treatment: 9 Constitutional Sitting or standing Blood Pressure is within target range for patient.. Pulse regular and within target range for patient.Marland Kitchen Respirations regular, non- labored and within target range.. Temperature is normal and within the target range for the patient.Marland Kitchen appears in no distress. Notes Wound exam; I used a #3 curette to remove skin and eschar from the surface of the wound unfortunately this is not completely closed although the surface looks quite vibrant. No bleeding. Edema control is good no infection Electronic Signature(s) Signed: 02/06/2021 5:17:14 PM By: Linton Ham MD Entered By: Linton Ham on 02/06/2021 09:23:34 [name] [name] ([id number]) -------------------------------------------------------------------------------- Physician Orders Details Patient Name: [name] [name] Date of Service: 02/06/2021 9:00 AM Medical Record Number: [id number] Patient Account Number: [id number] Date of Birth/Sex: 01/16/1927 (87 y.o. F) Treating RN: Dolan Amen Primary Care Provider: Tracie Harrier Other Clinician: Jeanine Luz Referring Provider: Tracie Harrier Treating Provider/Extender: Tito Dine in Treatment: 9 Verbal / Phone Orders: No Diagnosis Coding Follow-up Appointments o Return Appointment in 1 week. Bathing/ Shower/ Hygiene o May shower with wound dressing protected with water repellent cover or cast protector. Edema Control - Lymphedema /  Segmental Compressive Device / Other Bilateral Lower Extremities o Optional: One layer of unna paste to top of compression wrap (to act as an anchor). o Elevate, Exercise Daily and Avoid Standing for Long Periods of Time. o Elevate legs to the level of the heart and pump ankles as often as possible o Elevate leg(s) parallel to the floor when sitting. Additional Orders / Instructions o Follow Nutritious Diet and Increase Protein Intake Wound Treatment Wound #1 - Lower Leg Wound Laterality: Right, Posterior, Proximal Cleanser: Soap and Water 1 x Per Week/15 Days Discharge Instructions: Gently cleanse wound with antibacterial soap, rinse and pat dry prior to dressing wounds Topical: Triamcinolone Acetonide Cream, 0.1%, 15 (g) tube (Generic) 1 x Per Week/15 Days Discharge Instructions: Apply as directed by provider. Primary Dressing: Prisma 4.34 (in) 1 x Per Week/15 Days Discharge Instructions: Moisten w/normal saline or sterile water; Cover wound as directed. Do not remove from wound bed. Secondary Dressing: Xtrasorb Medium 4x5 (in/in) 1 x Per Week/15 Days Discharge Instructions: Apply to wound as directed. Do not cut. Compression Wrap: Profore Lite LF 3 Multilayer Compression Bandaging System 1 x Per Week/15 Days Discharge Instructions: Apply 3 multi-layer wrap as prescribed. Electronic Signature(s) Signed: 02/06/2021 12:48:14 PM By: Georges Mouse, Minus Breeding RN Signed: 02/06/2021 5:17:14 PM By: Linton Ham MD Entered By: Georges Mouse, Minus Breeding on 02/06/2021 09:21:46 [name] [name] ([id number]) -------------------------------------------------------------------------------- Problem List Details Patient Name: [name] [name], [name] [name]. Date of Service: 02/06/2021 9:00 AM Medical Record Number: [id number] Patient Account Number: [id number] Date of Birth/Sex: 01/26/1927 (87 y.o. F) Treating RN: Cornell Barman Primary Care Provider: Tracie Harrier Other Clinician: Jeanine Luz Referring  Provider: Tracie Harrier Treating Provider/Extender: Tito Dine in Treatment: 9 Active Problems ICD-10 Encounter Code Description Active Date MDM Diagnosis I87.333 Chronic venous hypertension (idiopathic) with  ulcer and inflammation of 12/05/2020 No Yes bilateral lower extremity L97.221 Non-pressure chronic ulcer of left calf limited to breakdown of skin 12/05/2020 No Yes L97.211 Non-pressure chronic ulcer of right calf limited to breakdown of skin 12/05/2020 No Yes M10.071 Idiopathic gout, right ankle and foot 01/09/2021 No Yes Inactive Problems Resolved Problems Electronic Signature(s) Signed: 02/06/2021 5:17:14 PM By: Linton Ham MD Entered By: Linton Ham on 02/06/2021 09:22:10 [name] [name] ([id number]) -------------------------------------------------------------------------------- Progress Note Details Patient Name: [name] [name] Date of Service: 02/06/2021 9:00 AM Medical Record Number: [id number] Patient Account Number: [id number] Date of Birth/Sex: 1927/07/18 (87 y.o. F) Treating RN: Cornell Barman Primary Care Provider: Tracie Harrier Other Clinician: Jeanine Luz Referring Provider: Tracie Harrier Treating Provider/Extender: Tito Dine in Treatment: 9 Subjective History of Present Illness (HPI) ADMISSION 12/05/2020 This is a 87 year old woman who arrived accompanied by her daughter from [place]. She has been dealing with wounds on her bilateral posterior calf since January. There is no obvious antecedent injury. Her daughter tells me that she is actually had wounds that have opened and closed on her legs since last summer. She currently she has 2 wounds on the right posterior calf and 1 on the left. They have been using Vaseline on the wounds and covering the area with kerlix but they have not been healing. The patient has been wearing what appears to be support stockings. Past medical history includes paroxysmal A. fib,  pacemaker,, hypertension, PVD, COPD, left mastectomy chronic lower extremity swelling ABIs were noncompressible bilateral 3/23; 1 week follow-up. Bilateral venous insufficiency ulcers right greater than left. As opposed to what I was worried about last week she actually tolerated 3 layer compression. We used Hydrofera Blue 3/30; bilateral venous insufficiency ulcers right greater than left. The area on the left is healed this week still 2 areas on the posterior right calf that are both measuring smaller. We have been using Hydrofera Blue under 3 layer compression she is. She seems to be tolerating this 4/6; initially with bilateral venous insufficiency ulcers although the area on the left is healed. She has a juxta lite stocking here which was properly applied by her daughter. She still has 2 areas on the posterior calf. These have been difficult in terms of maintaining a surface we have been using Hydrofera Blue under 3 layer compression 4/13; patient initially came in with bilateral posterior calf venous insufficiency wounds. The area on the left are healed. She has a juxta lite stocking that she is using. She still has the 2 areas on the posterior calf. She said the pain in the larger area was so bad she took her wrap off 2 days ago. We have been using Hydrofera Blue 01/09/21 upon evaluation today patient appears to be doing well in regards to her wound. I did not see that per se today I saw the picture but she actually had to come back and do the fact that her toes were somewhat abnormally discolored and the nurse was concerned about it. The patient was kind enough to come back in later in the afternoon for me to have a look. The good news is that this does not appear to be as discolored but I am concerned actually about the possibility of gout based on what I see. 4/27; the patient's right posterior calf wounds are both smaller. Still with slough which is adherent but did not generally  better. She received 3 doses of colchicine last week for acute gout. She does not  have a history of gout although this seems to have helped 5/4; right posterior calf wounds. Superior wound still with a nonviable surface inferior wound also. Using silver alginate 5/11; right posterior calf wounds. Wounds are considerably improved in surface area using silver alginate under compression. She has chronic venous insufficiency and she has stockings in the waiting 5/18; right posterior calf wounds which are venous. She had 2 wounds here the more distal one is closed. Dry skin over the surface of the other with eschar. Objective Constitutional Sitting or standing Blood Pressure is within target range for patient.. Pulse regular and within target range for patient.Marland Kitchen Respirations regular, non- labored and within target range.. Temperature is normal and within the target range for the patient.Marland Kitchen appears in no distress. Vitals Time Taken: 9:01 AM, Height: 63 in, Weight: 174 lbs, BMI: 30.8, Temperature: 98.4 F, Pulse: 79 bpm, Respiratory Rate: 20 breaths/min, Blood Pressure: 109/78 mmHg. General Notes: Wound exam; I used a #3 curette to remove skin and eschar from the surface of the wound unfortunately this is not completely closed although the surface looks quite vibrant. No bleeding. Edema control is good no infection Integumentary (Hair, Skin) Wound #1 status is Open. Original cause of wound was Gradually Appeared. The date acquired was: 09/30/2020. The wound has been in treatment 9 weeks. The wound is located on the Right,Proximal,Posterior Lower Leg. The wound measures 0.4cm length x 0.3cm width x 0.1cm depth; Icenhower, Nirvi B. ([id number]) 0.094cm^2 area and 0.009cm^3 volume. There is Fat Layer (Subcutaneous Tissue) exposed. There is no tunneling or undermining noted. There is a small amount of serosanguineous drainage noted. There is small (1-33%) red granulation within the wound bed. There is a large  (67-100%) amount of necrotic tissue within the wound bed including Eschar. Wound #2 status is Healed - Epithelialized. Original cause of wound was Gradually Appeared. The date acquired was: 09/30/2020. The wound has been in treatment 9 weeks. The wound is located on the Right,Distal,Posterior Lower Leg. The wound measures 0cm length x 0cm width x 0cm depth; 0cm^2 area and 0cm^3 volume. There is no tunneling or undermining noted. There is a none present amount of drainage noted. There is no granulation within the wound bed. There is no necrotic tissue within the wound bed. Assessment Active Problems ICD-10 Chronic venous hypertension (idiopathic) with ulcer and inflammation of bilateral lower extremity Non-pressure chronic ulcer of left calf limited to breakdown of skin Non-pressure chronic ulcer of right calf limited to breakdown of skin Idiopathic gout, right ankle and foot Procedures Wound #1 Pre-procedure diagnosis of Wound #1 is a Venous Leg Ulcer located on the Right,Proximal,Posterior Lower Leg . There was a Three Layer Compression Therapy Procedure by Dolan Amen, RN. Post procedure Diagnosis Wound #1: Same as Pre-Procedure Notes: pt tolerating wrap well. Plan Follow-up Appointments: Return Appointment in 1 week. Bathing/ Shower/ Hygiene: May shower with wound dressing protected with water repellent cover or cast protector. Edema Control - Lymphedema / Segmental Compressive Device / Other: Optional: One layer of unna paste to top of compression wrap (to act as an anchor). Elevate, Exercise Daily and Avoid Standing for Long Periods of Time. Elevate legs to the level of the heart and pump ankles as often as possible Elevate leg(s) parallel to the floor when sitting. Additional Orders / Instructions: Follow Nutritious Diet and Increase Protein Intake WOUND #1: - Lower Leg Wound Laterality: Right, Posterior, Proximal Cleanser: Soap and Water 1 x Per Week/15 Days Discharge  Instructions: Gently cleanse wound with antibacterial  soap, rinse and pat dry prior to dressing wounds Topical: Triamcinolone Acetonide Cream, 0.1%, 15 (g) tube (Generic) 1 x Per Week/15 Days Discharge Instructions: Apply as directed by provider. Primary Dressing: Prisma 4.34 (in) 1 x Per Week/15 Days Discharge Instructions: Moisten w/normal saline or sterile water; Cover wound as directed. Do not remove from wound bed. Secondary Dressing: Xtrasorb Medium 4x5 (in/in) 1 x Per Week/15 Days Discharge Instructions: Apply to wound as directed. Do not cut. Compression Wrap: Profore Lite LF 3 Multilayer Compression Bandaging System 1 x Per Week/15 Days Discharge Instructions: Apply 3 multi-layer wrap as prescribed. 1. Continue with Prisma to her 1 remaining wound which is small with a healthy surface but not completely healed still under the same compression. 2. She has her compression stockings Electronic Signature(s) Signed: 02/06/2021 5:17:14 PM By: Linton Ham MD [name] [name] ([id number]) Entered By: Linton Ham on 02/06/2021 09:24:05 [name] [name] ([id number]) -------------------------------------------------------------------------------- SuperBill Details Patient Name: [name] [name] Date of Service: 02/06/2021 Medical Record Number: [id number] Patient Account Number: [id number] Date of Birth/Sex: [birth year] (87 y.o. F) Treating RN: Dolan Amen Primary Care Provider: Tracie Harrier Other Clinician: Jeanine Luz Referring Provider: Tracie Harrier Treating Provider/Extender: Tito Dine in Treatment: 9 Diagnosis Coding ICD-10 Codes Code Description [phone] Chronic venous hypertension (idiopathic) with ulcer and inflammation of bilateral lower extremity L97.221 Non-pressure chronic ulcer of left calf limited to breakdown of skin L97.211 Non-pressure chronic ulcer of right calf limited to breakdown of skin M10.071 Idiopathic gout, right ankle and  foot Facility Procedures CPT4 Code: [id number] Description: (Facility Use Only) [place] RT LEG Modifier: Quantity: 1 Physician Procedures CPT4 Code Description: [id number] - WC PHYS LEVEL 3 - EST PT Modifier: Quantity: 1 CPT4 Code Description: ICD-10 Diagnosis Description I87.333 Chronic venous hypertension (idiopathic) with ulcer and inflammation of L97.221 Non-pressure chronic ulcer of left calf limited to breakdown of skin L97.211 Non-pressure chronic ulcer of right  calf limited to breakdown of skin M10.071 Idiopathic gout, right ankle and foot Modifier: bilateral lower extre Quantity: mity Electronic Signature(s) Signed: 02/06/2021 5:17:14 PM By: Linton Ham MD Entered By: Linton Ham on 02/06/2021 [id number]

## 2021-02-13 ENCOUNTER — Other Ambulatory Visit: Payer: Self-pay

## 2021-02-13 ENCOUNTER — Encounter: Payer: PPO | Admitting: Internal Medicine

## 2021-02-13 DIAGNOSIS — I87333 Chronic venous hypertension (idiopathic) with ulcer and inflammation of bilateral lower extremity: Secondary | ICD-10-CM | POA: Diagnosis not present

## 2021-02-13 DIAGNOSIS — L97211 Non-pressure chronic ulcer of right calf limited to breakdown of skin: Secondary | ICD-10-CM | POA: Diagnosis not present

## 2021-02-14 NOTE — Progress Notes (Signed)
CASSAUNDRA, RASCH ([id number]) Visit Report for 02/13/2021 Arrival Information Details Patient Name: [name] [name], [name] [name]. Date of Service: 02/13/2021 10:30 AM Medical Record Number: [id number] Patient Account Number: [id number] Date of Birth/Sex: 10/18/1926 (87 y.o. F) Treating RN: Carlene Coria Primary Care Sydelle Sherfield: Tracie Harrier Other Clinician: Referring Ante Arredondo: Tracie Harrier Treating Arshiya Jakes/Extender: Tito Dine in Treatment: 10 Visit Information History Since Last Visit All ordered tests and consults were completed: No Patient Arrived: Ambulatory Added or deleted any medications: No Arrival Time: 10:54 Any new allergies or adverse reactions: No Accompanied By: daughter Had a fall or experienced change in No Transfer Assistance: None activities of daily living that may affect Patient Identification Verified: Yes risk of falls: Secondary Verification Process Completed: Yes Signs or symptoms of abuse/neglect since last visito No Patient Requires Transmission-Based No Hospitalized since last visit: No Precautions: Implantable device outside of the clinic excluding No Patient Has Alerts: Yes cellular tissue based products placed in the center Patient Alerts: Patient on Blood Thinner since last visit: warfarin Has Dressing in Place as Prescribed: Yes NON COMPRESSIBLE L and Has Compression in Place as Prescribed: Yes R NOT DIABETIC Pain Present Now: No Electronic Signature(s) Signed: 02/13/2021 4:53:29 PM By: Carlene Coria RN Entered By: Carlene Coria on 02/13/2021 10:59:42 [name] [name] ([id number]) -------------------------------------------------------------------------------- Clinic Level of Care Assessment Details Patient Name: [name] [name] Date of Service: 02/13/2021 10:30 AM Medical Record Number: [id number] Patient Account Number: [id number] Date of Birth/Sex: 03/27/1927 (87 y.o. F) Treating RN: Cornell Barman Primary Care Artem Bunte: Tracie Harrier  Other Clinician: Referring Kayzlee Wirtanen: Tracie Harrier Treating Henslee Lottman/Extender: Tito Dine in Treatment: 10 Clinic Level of Care Assessment Items TOOL 4 Quantity Score []  - Use when only an EandM is performed on FOLLOW-UP visit 0 ASSESSMENTS - Nursing Assessment / Reassessment X - Reassessment of Co-morbidities (includes updates in patient status) 1 10 X- 1 5 Reassessment of Adherence to Treatment Plan ASSESSMENTS - Wound and Skin Assessment / Reassessment X - Simple Wound Assessment / Reassessment - one wound 1 5 []  - 0 Complex Wound Assessment / Reassessment - multiple wounds []  - 0 Dermatologic / Skin Assessment (not related to wound area) ASSESSMENTS - Focused Assessment []  - Circumferential Edema Measurements - multi extremities 0 []  - 0 Nutritional Assessment / Counseling / Intervention []  - 0 Lower Extremity Assessment (monofilament, tuning fork, pulses) []  - 0 Peripheral Arterial Disease Assessment (using hand held doppler) ASSESSMENTS - Ostomy and/or Continence Assessment and Care []  - Incontinence Assessment and Management 0 []  - 0 Ostomy Care Assessment and Management (repouching, etc.) PROCESS - Coordination of Care X - Simple Patient / Family Education for ongoing care 1 15 []  - 0 Complex (extensive) Patient / Family Education for ongoing care []  - 0 Staff obtains Programmer, systems, Records, Test Results / Process Orders []  - 0 Staff telephones HHA, Nursing Homes / Clarify orders / etc []  - 0 Routine Transfer to another Facility (non-emergent condition) []  - 0 Routine Hospital Admission (non-emergent condition) []  - 0 New Admissions / Biomedical engineer / Ordering NPWT, Apligraf, etc. []  - 0 Emergency Hospital Admission (emergent condition) X- 1 10 Simple Discharge Coordination []  - 0 Complex (extensive) Discharge Coordination PROCESS - Special Needs []  - Pediatric / Minor Patient Management 0 []  - 0 Isolation Patient Management []  -  0 Hearing / Language / Visual special needs []  - 0 Assessment of Community assistance (transportation, D/C planning, etc.) []  - 0 Additional assistance / Altered mentation []  - 0 Support Surface(s)  Assessment (bed, cushion, seat, etc.) INTERVENTIONS - Wound Cleansing / Measurement NASYA, VINCENT B. ([id number]) X- 1 5 Simple Wound Cleansing - one wound []  - 0 Complex Wound Cleansing - multiple wounds X- 1 5 Wound Imaging (photographs - any number of wounds) []  - 0 Wound Tracing (instead of photographs) X- 1 5 Simple Wound Measurement - one wound []  - 0 Complex Wound Measurement - multiple wounds INTERVENTIONS - Wound Dressings []  - Small Wound Dressing one or multiple wounds 0 []  - 0 Medium Wound Dressing one or multiple wounds []  - 0 Large Wound Dressing one or multiple wounds []  - 0 Application of Medications - topical []  - 0 Application of Medications - injection INTERVENTIONS - Miscellaneous []  - External ear exam 0 []  - 0 Specimen Collection (cultures, biopsies, blood, body fluids, etc.) []  - 0 Specimen(s) / Culture(s) sent or taken to Lab for analysis []  - 0 Patient Transfer (multiple staff / Civil Service fast streamer / Similar devices) []  - 0 Simple Staple / Suture removal (25 or less) []  - 0 Complex Staple / Suture removal (26 or more) []  - 0 Hypo / Hyperglycemic Management (close monitor of Blood Glucose) []  - 0 Ankle / Brachial Index (ABI) - do not check if billed separately X- 1 5 Vital Signs Has the patient been seen at the hospital within the last three years: Yes Total Score: 65 Level Of Care: New/Established - Level 2 Electronic Signature(s) Signed: 02/14/2021 9:33:55 AM By: Gretta Cool, BSN, RN, CWS, Kim RN, BSN Entered By: Gretta Cool, BSN, RN, CWS, Kim on 02/13/2021 11:18:57 [name] [name] ([id number]) -------------------------------------------------------------------------------- Encounter Discharge Information Details Patient Name: [name] [name] Date of  Service: 02/13/2021 10:30 AM Medical Record Number: [id number] Patient Account Number: [id number] Date of Birth/Sex: 1927/03/30 (87 y.o. F) Treating RN: Cornell Barman Primary Care Jayliah Benett: Tracie Harrier Other Clinician: Referring Brazil Voytko: Tracie Harrier Treating Dariush Mcnellis/Extender: Tito Dine in Treatment: 10 Encounter Discharge Information Items Discharge Condition: Stable Ambulatory Status: Ambulatory Discharge Destination: Home Transportation: Private Auto Accompanied By: daughter Schedule Follow-up Appointment: No Clinical Summary of Care: Electronic Signature(s) Signed: 02/14/2021 9:33:55 AM By: Gretta Cool, BSN, RN, CWS, Kim RN, BSN Entered By: Gretta Cool, BSN, RN, CWS, Kim on 02/13/2021 11:20:00 [name] [name] ([id number]) -------------------------------------------------------------------------------- Lower Extremity Assessment Details Patient Name: ADDALYNNE, GOLDING. Date of Service: 02/13/2021 10:30 AM Medical Record Number: [id number] Patient Account Number: [id number] Date of Birth/Sex: 06-28-1927 (87 y.o. F) Treating RN: Carlene Coria Primary Care Raquell Richer: Tracie Harrier Other Clinician: Referring Autrey Human: Tracie Harrier Treating Windsor Zirkelbach/Extender: Ricard Dillon Weeks in Treatment: 10 Edema Assessment Assessed: [Left: Yes] [Right: No] [Left: Edema] [Right: :] Calf Left: Right: Point of Measurement: 31 cm From Medial Instep 31 cm Ankle Left: Right: Point of Measurement: 11 cm From Medial Instep 19 cm Vascular Assessment Pulses: Dorsalis Pedis Palpable: [Right:Yes] Electronic Signature(s) Signed: 02/13/2021 4:53:29 PM By: Carlene Coria RN Entered By: Carlene Coria on 02/13/2021 11:08:51 [name] [name] ([id number]) -------------------------------------------------------------------------------- Multi Wound Chart Details Patient Name: [name] [name] Date of Service: 02/13/2021 10:30 AM Medical Record Number: [id number] Patient Account Number:  [id number] Date of Birth/Sex: Sep 26, 1926 (87 y.o. F) Treating RN: Cornell Barman Primary Care Afsa Meany: Tracie Harrier Other Clinician: Referring Cherry Turlington: Tracie Harrier Treating Jaylnn Ullery/Extender: Tito Dine in Treatment: 10 Vital Signs Height(in): 63 Pulse(bpm): 85 Weight(lbs): 174 Blood Pressure(mmHg): 96/60 Body Mass Index(BMI): 31 Temperature(F): 97.2 Respiratory Rate(breaths/min): 18 Photos: [N/A:N/A] Wound Location: Right, Proximal, Posterior Lower N/A N/A Leg Wounding Event: Gradually Appeared N/A N/A Primary Etiology: Venous Leg  Ulcer N/A N/A Comorbid History: Chronic Obstructive Pulmonary N/A N/A Disease (COPD), Arrhythmia, Congestive Heart Failure, Hypertension, Received Chemotherapy Date Acquired: 09/30/2020 N/A N/A Weeks of Treatment: 10 N/A N/A Wound Status: Open N/A N/A Measurements L x W x D (cm) 0x0x0 N/A N/A Area (cm) : 0 N/A N/A Volume (cm) : 0 N/A N/A % Reduction in Area: 100.00% N/A N/A % Reduction in Volume: 100.00% N/A N/A Classification: Full Thickness Without Exposed N/A N/A Support Structures Exudate Amount: None Present N/A N/A Granulation Amount: None Present (0%) N/A N/A Necrotic Amount: None Present (0%) N/A N/A Exposed Structures: Fascia: No N/A N/A Fat Layer (Subcutaneous Tissue): No Tendon: No Muscle: No Joint: No Bone: No Epithelialization: Large (67-100%) N/A N/A Treatment Notes Electronic Signature(s) Signed: 02/14/2021 8:39:06 AM By: Linton Ham MD Entered By: Linton Ham on 02/13/2021 11:34:10 [name] [name] ([id number]) -------------------------------------------------------------------------------- [place] Details Patient Name: [name] [name] Date of Service: 02/13/2021 10:30 AM Medical Record Number: [id number] Patient Account Number: [id number] Date of Birth/Sex: [birth year] (87 y.o. F) Treating RN: Cornell Barman Primary Care Vetta Couzens: Tracie Harrier Other  Clinician: Referring Iyad Deroo: Tracie Harrier Treating Tally Mckinnon/Extender: Tito Dine in Treatment: 10 Active Inactive Electronic Signature(s) Signed: 02/14/2021 9:33:55 AM By: Gretta Cool, BSN, RN, CWS, Kim RN, BSN Entered By: Gretta Cool, BSN, RN, CWS, Kim on 02/13/2021 11:17:38 [name] [name] ([id number]) -------------------------------------------------------------------------------- Pain Assessment Details Patient Name: [name] [name] Date of Service: 02/13/2021 10:30 AM Medical Record Number: [id number] Patient Account Number: [id number] Date of Birth/Sex: [birth year] (87 y.o. F) Treating RN: Carlene Coria Primary Care Fatisha Rabalais: Tracie Harrier Other Clinician: Referring Verba Ainley: Tracie Harrier Treating Kestrel Mis/Extender: Tito Dine in Treatment: 10 Active Problems Location of Pain Severity and Description of Pain Patient Has Paino No Site Locations Pain Management and Medication Current Pain Management: Electronic Signature(s) Signed: 02/13/2021 4:53:29 PM By: Carlene Coria RN Entered By: Carlene Coria on 02/13/2021 11:00:44 [name] [name] ([id number]) -------------------------------------------------------------------------------- Patient/Caregiver Education Details Patient Name: [name] [name] Date of Service: 02/13/2021 10:30 AM Medical Record Number: [id number] Patient Account Number: [id number] Date of Birth/Gender: Aug 18, 1927 (87 y.o. F) Treating RN: Cornell Barman Primary Care Physician: Tracie Harrier Other Clinician: Referring Physician: Tracie Harrier Treating Physician/Extender: Tito Dine in Treatment: 10 Education Assessment Education Provided To: Patient Education Topics Provided Venous: Handouts: Controlling Swelling with Multilayered Compression Wraps Methods: Demonstration, Explain/Verbal Responses: State content correctly Electronic Signature(s) Signed: 02/14/2021 9:33:55 AM By: Gretta Cool, BSN, RN, CWS, Kim RN,  BSN Entered By: Gretta Cool, BSN, RN, CWS, Kim on 02/13/2021 11:19:18 [name] [name] ([id number]) -------------------------------------------------------------------------------- Wound Assessment Details Patient Name: [name] [name] Date of Service: 02/13/2021 10:30 AM Medical Record Number: [id number] Patient Account Number: [id number] Date of Birth/Sex: 10-May-1927 (87 y.o. F) Treating RN: Carlene Coria Primary Care Favio Moder: Tracie Harrier Other Clinician: Referring Yomayra Tate: Tracie Harrier Treating Melvern Ramone/Extender: Tito Dine in Treatment: 10 Wound Status Wound Number: 1 Primary Venous Leg Ulcer Etiology: Wound Location: Right, Proximal, Posterior Lower Leg Wound Open Wounding Event: Gradually Appeared Status: Date Acquired: 09/30/2020 Comorbid Chronic Obstructive Pulmonary Disease (COPD), Weeks Of Treatment: 10 History: Arrhythmia, Congestive Heart Failure, Hypertension, Clustered Wound: No Received Chemotherapy Photos Wound Measurements Length: (cm) 0 Width: (cm) 0 Depth: (cm) 0 Area: (cm) 0 Volume: (cm) 0 % Reduction in Area: 100% % Reduction in Volume: 100% Epithelialization: Large (67-100%) Tunneling: No Undermining: No Wound Description Classification: Full Thickness Without Exposed Support Structures Exudate Amount: None Present Foul Odor After Cleansing: No Slough/Fibrino No Wound Bed Granulation Amount: None  Present (0%) Exposed Structure Necrotic Amount: None Present (0%) Fascia Exposed: No Fat Layer (Subcutaneous Tissue) Exposed: No Tendon Exposed: No Muscle Exposed: No Joint Exposed: No Bone Exposed: No Electronic Signature(s) Signed: 02/13/2021 4:53:29 PM By: Carlene Coria RN Entered By: Carlene Coria on 02/13/2021 11:07:57 [name] [name] ([id number]) -------------------------------------------------------------------------------- Vitals Details Patient Name: Early Osmond B. Date of Service: 02/13/2021 10:30 AM Medical Record  Number: [id number] Patient Account Number: [id number] Date of Birth/Sex: 1927-04-05 (87 y.o. F) Treating RN: Carlene Coria Primary Care Amit Leece: Tracie Harrier Other Clinician: Referring Aniesha Haughn: Tracie Harrier Treating Quindarius Cabello/Extender: Tito Dine in Treatment: 10 Vital Signs Time Taken: 11:00 Temperature (F): 97.2 Height (in): 63 Pulse (bpm): 85 Weight (lbs): 174 Respiratory Rate (breaths/min): 18 Body Mass Index (BMI): 30.8 Blood Pressure (mmHg): 96/60 Reference Range: 80 - 120 mg / dl Electronic Signature(s) Signed: 02/13/2021 4:53:29 PM By: Carlene Coria RN Entered By: Carlene Coria on 02/13/2021 11:00:28

## 2021-02-14 NOTE — Progress Notes (Signed)
WILBURTA, MILBOURN ([id number]) Visit Report for 02/13/2021 HPI Details Patient Name: [name] [name], [name] [name]. Date of Service: 02/13/2021 10:30 AM Medical Record Number: [id number] Patient Account Number: [id number] Date of Birth/Sex: [birth year] (87 y.o. F) Treating RN: Primary Care Provider: Tracie Harrier Other Clinician: Referring Provider: Tracie Harrier Treating Provider/Extender: Tito Dine in Treatment: 10 History of Present Illness HPI Description: ADMISSION 12/05/2020 This is a 87 year old woman who arrived accompanied by her daughter from [place]. She has been dealing with wounds on her bilateral posterior calf since January. There is no obvious antecedent injury. Her daughter tells me that she is actually had wounds that have opened and closed on her legs since last summer. She currently she has 2 wounds on the right posterior calf and 1 on the left. They have been using Vaseline on the wounds and covering the area with kerlix but they have not been healing. The patient has been wearing what appears to be support stockings. Past medical history includes paroxysmal A. fib, pacemaker,, hypertension, PVD, COPD, left mastectomy chronic lower extremity swelling ABIs were noncompressible bilateral 3/23; 1 week follow-up. Bilateral venous insufficiency ulcers right greater than left. As opposed to what I was worried about last week she actually tolerated 3 layer compression. We used Hydrofera Blue 3/30; bilateral venous insufficiency ulcers right greater than left. The area on the left is healed this week still 2 areas on the posterior right calf that are both measuring smaller. We have been using Hydrofera Blue under 3 layer compression she is. She seems to be tolerating this 4/6; initially with bilateral venous insufficiency ulcers although the area on the left is healed. She has a juxta lite stocking here which was properly applied by her daughter. She still has 2 areas on  the posterior calf. These have been difficult in terms of maintaining a surface we have been using Hydrofera Blue under 3 layer compression 4/13; patient initially came in with bilateral posterior calf venous insufficiency wounds. The area on the left are healed. She has a juxta lite stocking that she is using. She still has the 2 areas on the posterior calf. She said the pain in the larger area was so bad she took her wrap off 2 days ago. We have been using Hydrofera Blue 01/09/21 upon evaluation today patient appears to be doing well in regards to her wound. I did not see that per se today I saw the picture but she actually had to come back and do the fact that her toes were somewhat abnormally discolored and the nurse was concerned about it. The patient was kind enough to come back in later in the afternoon for me to have a look. The good news is that this does not appear to be as discolored but I am concerned actually about the possibility of gout based on what I see. 4/27; the patient's right posterior calf wounds are both smaller. Still with slough which is adherent but did not generally better. She received 3 doses of colchicine last week for acute gout. She does not have a history of gout although this seems to have helped 5/4; right posterior calf wounds. Superior wound still with a nonviable surface inferior wound also. Using silver alginate 5/11; right posterior calf wounds. Wounds are considerably improved in surface area using silver alginate under compression. She has chronic venous insufficiency and she has stockings in the waiting 5/18; right posterior calf wounds which are venous. She had 2 wounds here the more distal  one is closed. Dry skin over the surface of the other with eschar. 5/25; nothing open on the right posterior calf. The larger wound superiorly under illumination is fully epithelialized Electronic Signature(s) Signed: 02/14/2021 8:39:06 AM By: Linton Ham  MD Entered By: Linton Ham on 02/13/2021 11:37:25 [name] [name] ([id number]) -------------------------------------------------------------------------------- Physical Exam Details Patient Name: [name] [name] Date of Service: 02/13/2021 10:30 AM Medical Record Number: [id number] Patient Account Number: [id number] Date of Birth/Sex: April 14, 1927 (87 y.o. F) Treating RN: Primary Care Provider: Tracie Harrier Other Clinician: Referring Provider: Tracie Harrier Treating Provider/Extender: Tito Dine in Treatment: 10 Constitutional Patient is hypotensive. However she appears well. Pulse regular and within target range for patient.Marland Kitchen Respirations regular, non-labored and within target range.. Temperature is normal and within the target range for the patient.Marland Kitchen appears in no distress. Cardiovascular We have good edema control. Notes Wound exam; under illumination both areas are epithelialized. The superior 1 still looks somewhat vulnerable but I think we can transition her into her own compression sock Electronic Signature(s) Signed: 02/14/2021 8:39:06 AM By: Linton Ham MD Entered By: Linton Ham on 02/13/2021 11:38:56 [name] [name] ([id number]) -------------------------------------------------------------------------------- Physician Orders Details Patient Name: [name] [name]. Date of Service: 02/13/2021 10:30 AM Medical Record Number: [id number] Patient Account Number: [id number] Date of Birth/Sex: 26-Feb-1927 (87 y.o. F) Treating RN: Cornell Barman Primary Care Provider: Tracie Harrier Other Clinician: Referring Provider: Tracie Harrier Treating Provider/Extender: Tito Dine in Treatment: 10 Verbal / Phone Orders: No Diagnosis Coding Discharge From [facility] Services o Discharge from [facility] Complete o Wear compression garments daily. Put garments on first thing when you wake up and remove them before bed. o Moisturize  legs daily after removing compression garments. o Elevate, Exercise Daily and Avoid Standing for Long Periods of Time. Electronic Signature(s) Signed: 02/14/2021 8:39:06 AM By: Linton Ham MD Signed: 02/14/2021 9:33:55 AM By: Gretta Cool, BSN, RN, CWS, Kim RN, BSN Entered By: Gretta Cool, BSN, RN, CWS, Kim on 02/13/2021 11:18:36 [name] [name] ([id number]) -------------------------------------------------------------------------------- Problem List Details Patient Name: [name] [name], [name] [name] Date of Service: 02/13/2021 10:30 AM Medical Record Number: [id number] Patient Account Number: [id number] Date of Birth/Sex: 1927/01/31 (87 y.o. F) Treating RN: Primary Care Provider: Tracie Harrier Other Clinician: Referring Provider: Tracie Harrier Treating Provider/Extender: Tito Dine in Treatment: 10 Active Problems ICD-10 Encounter Code Description Active Date MDM Diagnosis I87.333 Chronic venous hypertension (idiopathic) with ulcer and inflammation of 12/05/2020 No Yes bilateral lower extremity L97.221 Non-pressure chronic ulcer of left calf limited to breakdown of skin 12/05/2020 No Yes L97.211 Non-pressure chronic ulcer of right calf limited to breakdown of skin 12/05/2020 No Yes M10.071 Idiopathic gout, right ankle and foot 01/09/2021 No Yes Inactive Problems Resolved Problems Electronic Signature(s) Signed: 02/14/2021 8:39:06 AM By: Linton Ham MD Entered By: Linton Ham on 02/13/2021 11:34:04 [name] [name] ([id number]) -------------------------------------------------------------------------------- Progress Note Details Patient Name: [name] [name] Date of Service: 02/13/2021 10:30 AM Medical Record Number: [id number] Patient Account Number: [id number] Date of Birth/Sex: [birth year] (87 y.o. F) Treating RN: Primary Care Provider: Tracie Harrier Other Clinician: Referring Provider: Tracie Harrier Treating Provider/Extender: Tito Dine in Treatment:  10 Subjective History of Present Illness (HPI) ADMISSION 12/05/2020 This is a 87 year old woman who arrived accompanied by her daughter from [place]. She has been dealing with wounds on her bilateral posterior calf since January. There is no obvious antecedent injury. Her daughter tells me that she is actually had wounds that have opened and closed on her  legs since last summer. She currently she has 2 wounds on the right posterior calf and 1 on the left. They have been using Vaseline on the wounds and covering the area with kerlix but they have not been healing. The patient has been wearing what appears to be support stockings. Past medical history includes paroxysmal A. fib, pacemaker,, hypertension, PVD, COPD, left mastectomy chronic lower extremity swelling ABIs were noncompressible bilateral 3/23; 1 week follow-up. Bilateral venous insufficiency ulcers right greater than left. As opposed to what I was worried about last week she actually tolerated 3 layer compression. We used Hydrofera Blue 3/30; bilateral venous insufficiency ulcers right greater than left. The area on the left is healed this week still 2 areas on the posterior right calf that are both measuring smaller. We have been using Hydrofera Blue under 3 layer compression she is. She seems to be tolerating this 4/6; initially with bilateral venous insufficiency ulcers although the area on the left is healed. She has a juxta lite stocking here which was properly applied by her daughter. She still has 2 areas on the posterior calf. These have been difficult in terms of maintaining a surface we have been using Hydrofera Blue under 3 layer compression 4/13; patient initially came in with bilateral posterior calf venous insufficiency wounds. The area on the left are healed. She has a juxta lite stocking that she is using. She still has the 2 areas on the posterior calf. She said the pain in the larger area was so bad she took her  wrap off 2 days ago. We have been using Hydrofera Blue 01/09/21 upon evaluation today patient appears to be doing well in regards to her wound. I did not see that per se today I saw the picture but she actually had to come back and do the fact that her toes were somewhat abnormally discolored and the nurse was concerned about it. The patient was kind enough to come back in later in the afternoon for me to have a look. The good news is that this does not appear to be as discolored but I am concerned actually about the possibility of gout based on what I see. 4/27; the patient's right posterior calf wounds are both smaller. Still with slough which is adherent but did not generally better. She received 3 doses of colchicine last week for acute gout. She does not have a history of gout although this seems to have helped 5/4; right posterior calf wounds. Superior wound still with a nonviable surface inferior wound also. Using silver alginate 5/11; right posterior calf wounds. Wounds are considerably improved in surface area using silver alginate under compression. She has chronic venous insufficiency and she has stockings in the waiting 5/18; right posterior calf wounds which are venous. She had 2 wounds here the more distal one is closed. Dry skin over the surface of the other with eschar. 5/25; nothing open on the right posterior calf. The larger wound superiorly under illumination is fully epithelialized Objective Constitutional Patient is hypotensive. However she appears well. Pulse regular and within target range for patient.Marland Kitchen Respirations regular, non-labored and within target range.. Temperature is normal and within the target range for the patient.Marland Kitchen appears in no distress. Vitals Time Taken: 11:00 AM, Height: 63 in, Weight: 174 lbs, BMI: 30.8, Temperature: 97.2 F, Pulse: 85 bpm, Respiratory Rate: 18 breaths/min, Blood Pressure: 96/60 mmHg. Cardiovascular We have good edema control. General  Notes: Wound exam; under illumination both areas are epithelialized. The superior 1  still looks somewhat vulnerable but I think we can transition her into her own compression sock [name] [name], [name] B. ([id number]) Integumentary (Hair, Skin) Wound #1 status is Open. Original cause of wound was Gradually Appeared. The date acquired was: 09/30/2020. The wound has been in treatment 10 weeks. The wound is located on the Right,Proximal,Posterior Lower Leg. The wound measures 0cm length x 0cm width x 0cm depth; 0cm^2 area and 0cm^3 volume. There is no tunneling or undermining noted. There is a none present amount of drainage noted. There is no granulation within the wound bed. There is no necrotic tissue within the wound bed. Assessment Active Problems ICD-10 Chronic venous hypertension (idiopathic) with ulcer and inflammation of bilateral lower extremity Non-pressure chronic ulcer of left calf limited to breakdown of skin Non-pressure chronic ulcer of right calf limited to breakdown of skin Idiopathic gout, right ankle and foot Plan Discharge From [facility] Services: Discharge from [facility] Complete Wear compression garments daily. Put garments on first thing when you wake up and remove them before bed. Moisturize legs daily after removing compression garments. Elevate, Exercise Daily and Avoid Standing for Long Periods of Time. 1. I have recommended they a thick Band-Aid and her own external compression stockings. We went over this in some detail Electronic Signature(s) Signed: 02/14/2021 8:39:06 AM By: Linton Ham MD Entered By: Linton Ham on 02/13/2021 11:39:21 [name] [name] ([id number]) -------------------------------------------------------------------------------- SuperBill Details Patient Name: [name] [name] Date of Service: 02/13/2021 Medical Record Number: [id number] Patient Account Number: [id number] Date of Birth/Sex: Jan 28, 1927 (87 y.o. F) Treating RN: Primary  Care Provider: Tracie Harrier Other Clinician: Referring Provider: Tracie Harrier Treating Provider/Extender: Tito Dine in Treatment: 10 Diagnosis Coding ICD-10 Codes Code Description [phone] Chronic venous hypertension (idiopathic) with ulcer and inflammation of bilateral lower extremity L97.221 Non-pressure chronic ulcer of left calf limited to breakdown of skin L97.211 Non-pressure chronic ulcer of right calf limited to breakdown of skin M10.071 Idiopathic gout, right ankle and foot Facility Procedures CPT4 Code: [id number] Description: 14970 - WOUND CARE VISIT-LEV 2 EST PT Modifier: Quantity: 1 Physician Procedures CPT4 Code Description: [id number] 85027 - WC PHYS LEVEL 2 - EST PT Modifier: Quantity: 1 CPT4 Code Description: ICD-10 Diagnosis Description L97.211 Non-pressure chronic ulcer of right calf limited to breakdown of skin I87.333 Chronic venous hypertension (idiopathic) with ulcer and inflammation of Modifier: bilateral lower extre Quantity: mity Electronic Signature(s) Signed: 02/14/2021 8:39:06 AM By: Linton Ham MD Entered By: Linton Ham on 02/13/2021 11:40:01

## 2021-02-20 ENCOUNTER — Ambulatory Visit: Payer: PPO | Admitting: Internal Medicine

## 2021-02-20 DIAGNOSIS — R197 Diarrhea, unspecified: Secondary | ICD-10-CM | POA: Diagnosis not present

## 2021-02-26 ENCOUNTER — Emergency Department: Payer: PPO

## 2021-02-26 ENCOUNTER — Other Ambulatory Visit: Payer: Self-pay

## 2021-02-26 ENCOUNTER — Encounter: Payer: Self-pay | Admitting: Emergency Medicine

## 2021-02-26 ENCOUNTER — Inpatient Hospital Stay
Admission: EM | Admit: 2021-02-26 | Discharge: 2021-03-02 | DRG: 381 | Disposition: A | Discharge status: Other Acute Inpatient Hospital | Payer: PPO | Attending: Internal Medicine | Admitting: Internal Medicine

## 2021-02-26 DIAGNOSIS — R933 Abnormal findings on diagnostic imaging of other parts of digestive tract: Secondary | ICD-10-CM

## 2021-02-26 DIAGNOSIS — K6389 Other specified diseases of intestine: Secondary | ICD-10-CM | POA: Diagnosis not present

## 2021-02-26 DIAGNOSIS — I255 Ischemic cardiomyopathy: Secondary | ICD-10-CM | POA: Diagnosis present

## 2021-02-26 DIAGNOSIS — Z833 Family history of diabetes mellitus: Secondary | ICD-10-CM

## 2021-02-26 DIAGNOSIS — I5032 Chronic diastolic (congestive) heart failure: Secondary | ICD-10-CM | POA: Diagnosis present

## 2021-02-26 DIAGNOSIS — K2990 Gastroduodenitis, unspecified, without bleeding: Secondary | ICD-10-CM | POA: Diagnosis not present

## 2021-02-26 DIAGNOSIS — J9811 Atelectasis: Secondary | ICD-10-CM | POA: Diagnosis not present

## 2021-02-26 DIAGNOSIS — Z886 Allergy status to analgesic agent status: Secondary | ICD-10-CM | POA: Diagnosis not present

## 2021-02-26 DIAGNOSIS — J449 Chronic obstructive pulmonary disease, unspecified: Secondary | ICD-10-CM | POA: Diagnosis not present

## 2021-02-26 DIAGNOSIS — N1831 Chronic kidney disease, stage 3a: Secondary | ICD-10-CM | POA: Diagnosis not present

## 2021-02-26 DIAGNOSIS — K3189 Other diseases of stomach and duodenum: Secondary | ICD-10-CM

## 2021-02-26 DIAGNOSIS — Z853 Personal history of malignant neoplasm of breast: Secondary | ICD-10-CM

## 2021-02-26 DIAGNOSIS — K56600 Partial intestinal obstruction, unspecified as to cause: Secondary | ICD-10-CM

## 2021-02-26 DIAGNOSIS — R17 Unspecified jaundice: Secondary | ICD-10-CM | POA: Diagnosis present

## 2021-02-26 DIAGNOSIS — Z8349 Family history of other endocrine, nutritional and metabolic diseases: Secondary | ICD-10-CM

## 2021-02-26 DIAGNOSIS — M47814 Spondylosis without myelopathy or radiculopathy, thoracic region: Secondary | ICD-10-CM | POA: Diagnosis not present

## 2021-02-26 DIAGNOSIS — K5730 Diverticulosis of large intestine without perforation or abscess without bleeding: Secondary | ICD-10-CM | POA: Diagnosis not present

## 2021-02-26 DIAGNOSIS — I4891 Unspecified atrial fibrillation: Secondary | ICD-10-CM | POA: Diagnosis not present

## 2021-02-26 DIAGNOSIS — I130 Hypertensive heart and chronic kidney disease with heart failure and stage 1 through stage 4 chronic kidney disease, or unspecified chronic kidney disease: Secondary | ICD-10-CM | POA: Diagnosis present

## 2021-02-26 DIAGNOSIS — R197 Diarrhea, unspecified: Secondary | ICD-10-CM | POA: Diagnosis not present

## 2021-02-26 DIAGNOSIS — K222 Esophageal obstruction: Secondary | ICD-10-CM | POA: Diagnosis present

## 2021-02-26 DIAGNOSIS — R109 Unspecified abdominal pain: Secondary | ICD-10-CM

## 2021-02-26 DIAGNOSIS — Z7951 Long term (current) use of inhaled steroids: Secondary | ICD-10-CM

## 2021-02-26 DIAGNOSIS — R1084 Generalized abdominal pain: Secondary | ICD-10-CM | POA: Diagnosis not present

## 2021-02-26 DIAGNOSIS — I700 Atherosclerosis of aorta: Secondary | ICD-10-CM | POA: Diagnosis present

## 2021-02-26 DIAGNOSIS — I10 Essential (primary) hypertension: Secondary | ICD-10-CM | POA: Diagnosis present

## 2021-02-26 DIAGNOSIS — Z4659 Encounter for fitting and adjustment of other gastrointestinal appliance and device: Secondary | ICD-10-CM | POA: Diagnosis not present

## 2021-02-26 DIAGNOSIS — Z96642 Presence of left artificial hip joint: Secondary | ICD-10-CM | POA: Diagnosis not present

## 2021-02-26 DIAGNOSIS — I82409 Acute embolism and thrombosis of unspecified deep veins of unspecified lower extremity: Secondary | ICD-10-CM | POA: Diagnosis present

## 2021-02-26 DIAGNOSIS — Z20822 Contact with and (suspected) exposure to covid-19: Secondary | ICD-10-CM | POA: Diagnosis not present

## 2021-02-26 DIAGNOSIS — Z832 Family history of diseases of the blood and blood-forming organs and certain disorders involving the immune mechanism: Secondary | ICD-10-CM

## 2021-02-26 DIAGNOSIS — R791 Abnormal coagulation profile: Secondary | ICD-10-CM | POA: Diagnosis not present

## 2021-02-26 DIAGNOSIS — K838 Other specified diseases of biliary tract: Secondary | ICD-10-CM | POA: Diagnosis not present

## 2021-02-26 DIAGNOSIS — K219 Gastro-esophageal reflux disease without esophagitis: Secondary | ICD-10-CM | POA: Diagnosis not present

## 2021-02-26 DIAGNOSIS — Z7189 Other specified counseling: Secondary | ICD-10-CM | POA: Diagnosis not present

## 2021-02-26 DIAGNOSIS — Z66 Do not resuscitate: Secondary | ICD-10-CM | POA: Diagnosis not present

## 2021-02-26 DIAGNOSIS — I4819 Other persistent atrial fibrillation: Secondary | ICD-10-CM | POA: Diagnosis not present

## 2021-02-26 DIAGNOSIS — Z803 Family history of malignant neoplasm of breast: Secondary | ICD-10-CM

## 2021-02-26 DIAGNOSIS — R112 Nausea with vomiting, unspecified: Secondary | ICD-10-CM | POA: Diagnosis not present

## 2021-02-26 DIAGNOSIS — K5900 Constipation, unspecified: Secondary | ICD-10-CM | POA: Diagnosis not present

## 2021-02-26 DIAGNOSIS — Z950 Presence of cardiac pacemaker: Secondary | ICD-10-CM | POA: Diagnosis not present

## 2021-02-26 DIAGNOSIS — Z7901 Long term (current) use of anticoagulants: Secondary | ICD-10-CM

## 2021-02-26 DIAGNOSIS — I864 Gastric varices: Secondary | ICD-10-CM | POA: Diagnosis not present

## 2021-02-26 DIAGNOSIS — R140 Abdominal distension (gaseous): Secondary | ICD-10-CM | POA: Diagnosis not present

## 2021-02-26 DIAGNOSIS — I739 Peripheral vascular disease, unspecified: Secondary | ICD-10-CM | POA: Diagnosis not present

## 2021-02-26 DIAGNOSIS — R000 Tachycardia, unspecified: Secondary | ICD-10-CM | POA: Diagnosis not present

## 2021-02-26 DIAGNOSIS — Z87891 Personal history of nicotine dependence: Secondary | ICD-10-CM

## 2021-02-26 DIAGNOSIS — Z880 Allergy status to penicillin: Secondary | ICD-10-CM

## 2021-02-26 DIAGNOSIS — E785 Hyperlipidemia, unspecified: Secondary | ICD-10-CM | POA: Diagnosis present

## 2021-02-26 DIAGNOSIS — K311 Adult hypertrophic pyloric stenosis: Principal | ICD-10-CM | POA: Diagnosis present

## 2021-02-26 DIAGNOSIS — R7989 Other specified abnormal findings of blood chemistry: Secondary | ICD-10-CM | POA: Diagnosis not present

## 2021-02-26 DIAGNOSIS — E860 Dehydration: Secondary | ICD-10-CM | POA: Diagnosis not present

## 2021-02-26 DIAGNOSIS — R079 Chest pain, unspecified: Secondary | ICD-10-CM | POA: Diagnosis not present

## 2021-02-26 DIAGNOSIS — K831 Obstruction of bile duct: Secondary | ICD-10-CM | POA: Diagnosis not present

## 2021-02-26 DIAGNOSIS — Z888 Allergy status to other drugs, medicaments and biological substances status: Secondary | ICD-10-CM

## 2021-02-26 DIAGNOSIS — Z9012 Acquired absence of left breast and nipple: Secondary | ICD-10-CM

## 2021-02-26 DIAGNOSIS — N183 Chronic kidney disease, stage 3 unspecified: Secondary | ICD-10-CM | POA: Diagnosis present

## 2021-02-26 DIAGNOSIS — Z8249 Family history of ischemic heart disease and other diseases of the circulatory system: Secondary | ICD-10-CM

## 2021-02-26 DIAGNOSIS — K5669 Other partial intestinal obstruction: Secondary | ICD-10-CM | POA: Diagnosis not present

## 2021-02-26 DIAGNOSIS — K2980 Duodenitis without bleeding: Secondary | ICD-10-CM | POA: Diagnosis not present

## 2021-02-26 DIAGNOSIS — Z4682 Encounter for fitting and adjustment of non-vascular catheter: Secondary | ICD-10-CM | POA: Diagnosis not present

## 2021-02-26 DIAGNOSIS — Z79899 Other long term (current) drug therapy: Secondary | ICD-10-CM

## 2021-02-26 DIAGNOSIS — I495 Sick sinus syndrome: Secondary | ICD-10-CM | POA: Diagnosis present

## 2021-02-26 DIAGNOSIS — Z90710 Acquired absence of both cervix and uterus: Secondary | ICD-10-CM

## 2021-02-26 DIAGNOSIS — I2510 Atherosclerotic heart disease of native coronary artery without angina pectoris: Secondary | ICD-10-CM | POA: Diagnosis not present

## 2021-02-26 DIAGNOSIS — Z0189 Encounter for other specified special examinations: Secondary | ICD-10-CM

## 2021-02-26 DIAGNOSIS — M810 Age-related osteoporosis without current pathological fracture: Secondary | ICD-10-CM | POA: Diagnosis present

## 2021-02-26 DIAGNOSIS — I824Y9 Acute embolism and thrombosis of unspecified deep veins of unspecified proximal lower extremity: Secondary | ICD-10-CM | POA: Diagnosis not present

## 2021-02-26 LAB — COMPREHENSIVE METABOLIC PANEL
ALT: 16 U/L (ref 0–44)
AST: 28 U/L (ref 15–41)
Albumin: 4 g/dL (ref 3.5–5.0)
Alkaline Phosphatase: 75 U/L (ref 38–126)
Anion gap: 9 (ref 5–15)
BUN: 37 mg/dL — ABNORMAL HIGH (ref 8–23)
CO2: 24 mmol/L (ref 22–32)
Calcium: 9.8 mg/dL (ref 8.9–10.3)
Chloride: 105 mmol/L (ref 98–111)
Creatinine, Ser: 1.33 mg/dL — ABNORMAL HIGH (ref 0.44–1.00)
GFR, Estimated: 38 mL/min — ABNORMAL LOW (ref >60)
Glucose, Bld: 107 mg/dL — ABNORMAL HIGH (ref 70–99)
Potassium: 4.8 mmol/L (ref 3.5–5.1)
Sodium: 138 mmol/L (ref 135–145)
Total Bilirubin: 1.4 mg/dL — ABNORMAL HIGH (ref 0.3–1.2)
Total Protein: 7.2 g/dL (ref 6.5–8.1)

## 2021-02-26 LAB — CBC
HCT: 42.5 % (ref 36.0–46.0)
Hemoglobin: 13.7 g/dL (ref 12.0–15.0)
MCH: 30.9 pg (ref 26.0–34.0)
MCHC: 32.2 g/dL (ref 30.0–36.0)
MCV: 95.7 fL (ref 80.0–100.0)
Platelets: 185 10*3/uL (ref 150–400)
RBC: 4.44 MIL/uL (ref 3.87–5.11)
RDW: 14.8 % (ref 11.5–15.5)
WBC: 6.4 10*3/uL (ref 4.0–10.5)
nRBC: 0 % (ref 0.0–0.2)

## 2021-02-26 LAB — URINALYSIS, COMPLETE (UACMP) WITH MICROSCOPIC
Bilirubin Urine: NEGATIVE
Glucose, UA: NEGATIVE mg/dL
Hgb urine dipstick: NEGATIVE
Ketones, ur: NEGATIVE mg/dL
Nitrite: NEGATIVE
Protein, ur: NEGATIVE mg/dL
Specific Gravity, Urine: 1.04 — ABNORMAL HIGH (ref 1.005–1.030)
pH: 5 (ref 5.0–8.0)

## 2021-02-26 LAB — TROPONIN I (HIGH SENSITIVITY)
Troponin I (High Sensitivity): 12 ng/L (ref <18)
Troponin I (High Sensitivity): 11 ng/L (ref <18)
Troponin I (High Sensitivity): 13 ng/L (ref <18)
Troponin I (High Sensitivity): 16 ng/L (ref <18)

## 2021-02-26 LAB — BRAIN NATRIURETIC PEPTIDE: B Natriuretic Peptide: 531.8 pg/mL — ABNORMAL HIGH (ref 0.0–100.0)

## 2021-02-26 LAB — PROTIME-INR
INR: 2.6 — ABNORMAL HIGH (ref 0.8–1.2)
Prothrombin Time: 27.5 seconds — ABNORMAL HIGH (ref 11.4–15.2)

## 2021-02-26 LAB — APTT: aPTT: 37 seconds — ABNORMAL HIGH (ref 24–36)

## 2021-02-26 LAB — LIPASE, BLOOD: Lipase: 44 U/L (ref 11–51)

## 2021-02-26 LAB — SARS CORONAVIRUS 2 (TAT 6-24 HRS): SARS Coronavirus 2: NEGATIVE

## 2021-02-26 MED ORDER — ONDANSETRON HCL 4 MG/2ML IJ SOLN
4.0000 mg | Freq: Three times a day (TID) | INTRAMUSCULAR | Status: DC | PRN
  Administered 2021-02-27 – 2021-03-01 (×2): 4 mg via INTRAVENOUS
  Filled 2021-02-26 (×3): qty 2

## 2021-02-26 MED ORDER — MORPHINE SULFATE (PF) 2 MG/ML IV SOLN
0.5000 mg | INTRAVENOUS | Status: DC | PRN
Start: 2021-02-26 — End: 2021-03-02

## 2021-02-26 MED ORDER — MOMETASONE FURO-FORMOTEROL FUM 200-5 MCG/ACT IN AERO
2.0000 | INHALATION_SPRAY | Freq: Two times a day (BID) | RESPIRATORY_TRACT | Status: DC
  Administered 2021-02-26 – 2021-03-01 (×7): 2 via RESPIRATORY_TRACT
  Filled 2021-02-26: qty 8.8

## 2021-02-26 MED ORDER — ALBUTEROL SULFATE HFA 108 (90 BASE) MCG/ACT IN AERS
2.0000 | INHALATION_SPRAY | RESPIRATORY_TRACT | Status: DC | PRN
Start: 2021-02-26 — End: 2021-03-02
  Filled 2021-02-26: qty 6.7

## 2021-02-26 MED ORDER — METOPROLOL TARTRATE 5 MG/5ML IV SOLN
2.5000 mg | Freq: Three times a day (TID) | INTRAVENOUS | Status: DC
  Administered 2021-02-27 (×2): 2.5 mg via INTRAVENOUS
  Filled 2021-02-26 (×2): qty 5

## 2021-02-26 MED ORDER — IPRATROPIUM-ALBUTEROL 0.5-2.5 (3) MG/3ML IN SOLN: 3.0000 mL | Freq: Four times a day (QID) | RESPIRATORY_TRACT | Status: DC | PRN

## 2021-02-26 MED ORDER — ONDANSETRON HCL 4 MG/2ML IJ SOLN
4.0000 mg | Freq: Once | INTRAMUSCULAR | Status: AC
  Administered 2021-02-26: 4 mg via INTRAVENOUS
  Filled 2021-02-26: qty 2

## 2021-02-26 MED ORDER — LACTATED RINGERS IV SOLN: INTRAVENOUS | Status: DC

## 2021-02-26 MED ORDER — HYDRALAZINE HCL 20 MG/ML IJ SOLN: 5.0000 mg | INTRAMUSCULAR | Status: DC | PRN

## 2021-02-26 MED ORDER — TIOTROPIUM BROMIDE MONOHYDRATE 18 MCG IN CAPS: 18.0000 ug | ORAL_CAPSULE | Freq: Every day | RESPIRATORY_TRACT | Status: DC

## 2021-02-26 MED ORDER — IOHEXOL 300 MG/ML  SOLN
80.0000 mL | Freq: Once | INTRAMUSCULAR | Status: AC | PRN
  Administered 2021-02-26: 80 mL via INTRAVENOUS
  Filled 2021-02-26: qty 80

## 2021-02-26 MED ORDER — IPRATROPIUM-ALBUTEROL 0.5-2.5 (3) MG/3ML IN SOLN
3.0000 mL | Freq: Four times a day (QID) | RESPIRATORY_TRACT | Status: DC
  Administered 2021-02-26: 3 mL via RESPIRATORY_TRACT
  Filled 2021-02-26: qty 3

## 2021-02-26 MED ORDER — ACETAMINOPHEN 160 MG/5ML PO SOLN
650.0000 mg | Freq: Four times a day (QID) | ORAL | Status: DC | PRN
  Filled 2021-02-26 (×2): qty 20.3

## 2021-02-26 MED ORDER — LACTATED RINGERS IV BOLUS
500.0000 mL | Freq: Once | INTRAVENOUS | Status: AC
  Administered 2021-02-26: 500 mL via INTRAVENOUS

## 2021-02-26 MED ORDER — NITROGLYCERIN 0.4 MG SL SUBL: 0.4000 mg | SUBLINGUAL_TABLET | SUBLINGUAL | Status: DC

## 2021-02-26 NOTE — ED Notes (Signed)
Pt away at imaging. Will place NG tube once pt back to room. Visitor remains at bedside.

## 2021-02-26 NOTE — Progress Notes (Signed)
Lendon Ka (daughter) called with concerns as to why her mother has not completed the NG insertion. Provider was at bedside to insert tube and patient verbally declined and wants her PCP to review her meds to ensure she is suitable candidate for the procedure.Daughter is informed that no actions will be taken at this time.

## 2021-02-26 NOTE — H&P (Addendum)
History and Physical    [name] [name]:[id number] DOB: 08/31/1927 DOA: 02/26/2021  Referring MD/NP/PA:   PCP: Tracie Harrier, MD   Patient coming from:  The patient is coming from home.       Chief Complaint: Abdominal pain  HPI: [name] [name] is a 87 y.o. female with medical history significant of hypertension, hyperlipidemia, COPD, GERD, esophageal stricture, CKD stage IIIa, SSS, s/p of pacemaker placement, PVD and DVT on Coumadin, CAD, dCHF, remote left breast cancer, who presents with abdominal pain.  Patient states that she has been having intermittent nausea, vomiting, diarrhea and abdominal pain for couple of weeks.  No rectal bleeding.  She states that she has vomited 3 times with nonbilious nonbloody vomiting since yesterday.  She has had 4 times of diarrhea since yesterday.  Her abdominal pain is located in the lower abdomen, aching, moderate, nonradiating.  Denies fever or chills.  Patient does not have symptoms of UTI or hematuria.  Patient states she had 1 episode of chest pain in early morning, which has resolved.  Currently patient does not have chest pain.  She has chronic mild shortness breath, mild dry cough due to COPD, which has not changed.  ED Course: pt was found to have WBC 6.4, lactic acid 2.6, INR 2.6, troponin level 12, pending urinalysis, pending COVID PCR, renal function close to baseline, temperature normal, blood pressure 100/79, HR 100, RR 17, oxygen saturation 94% on room air.  Chest is negative.  Patient is admitted to [facility] bed as inpatient.  Dr. Dahlia Byes of surgery is consulted.  CT abdomen/pelvis without contrast: 1. Dilated stomach and proximal duodenum to the level of the junction of the 2nd and 3rd portions of the duodenum, possibly due to a duodenal mass at that location. The possible mass could also be obstructing the distal common duct at the ampulla. 2. Extensive calcific coronary artery and aortic atherosclerosis  CT-chest/abdomen/pelvis  with contrast 1. No acute cardiopulmonary disease. 2. Persistent dilatation of the proximal duodenum which may represent sequelae associated with a duodenal mass. Further evaluation with a GI consult and subsequent endoscopy is recommended. 3. Sigmoid diverticulosis.   Review of Systems:   General: no fevers, chills, no body weight gain, has poor appetite, has fatigue HEENT: no blurry vision, hearing changes or sore throat Respiratory: no dyspnea, coughing, wheezing CV: no chest pain, no palpitations GI: has nausea, vomiting, abdominal pain, diarrhea, no constipation GU: no dysuria, burning on urination, increased urinary frequency, hematuria  Ext: has trace leg edema Neuro: no unilateral weakness, numbness, or tingling, no vision change or hearing loss Skin: no rash, no skin tear. MSK: No muscle spasm, no deformity, no limitation of range of movement in spin Heme: No easy bruising.  Travel history: No recent long distant travel.  Allergy:  Allergies  Allergen Reactions  . Penicillins Anaphylaxis  . Amoxicillin   . Beta Adrenergic Blockers   . Meloxicam     Other reaction(s): Unknown  . Naproxen     Other reaction(s): Unknown  . Nsaids     Other reaction(s): Unknown    Past Medical History:  Diagnosis Date  . Anemia   . Arrhythmia    atrial fibrillation  . Asthma   . Breast cancer ([place]) 1978   left  . CAD (coronary artery disease)   . CHF (congestive heart failure) ([place])   . COPD (chronic obstructive pulmonary disease) ([place])   . DVT (deep venous thrombosis) ([place])   . Esophageal stricture   .  Hemorrhoids   . History of knee replacement, total   . Hyperlipidemia   . Hypertension   . Ischemic cardiomyopathy   . Osteoporosis   . Paroxysmal atrial fibrillation (HCC)   . Peripheral arterial disease ([place])   . Sick sinus syndrome [facility])     Past Surgical History:  Procedure Laterality Date  . ABDOMINAL HYSTERECTOMY  1985  . HEMORRHOID SURGERY    . ILIAC ARTERY  STENT    . INTRAOPERATIVE ARTERIOGRAM Right   . MASTECTOMY Left 1978  . PACEMAKER INSERTION Left 01/11/2020   Procedure: PACEMAKER GENERATOR CHANGE OUT;  Surgeon: Isaias Cowman, MD;  Location: [facility] ORS;  Service: Cardiovascular;  Laterality: Left;  . REDUCTION MAMMAPLASTY Right 1985   impant placed then removed    Social History:  reports that she quit smoking about 34 years ago. Her smoking use included cigarettes. She has never used smokeless tobacco. She reports that she does not drink alcohol and does not use drugs.  Family History:  Family History  Problem Relation Age of Onset  . Hypertension Father   . Heart disease Father   . Clotting disorder Father   . Cancer Mother        breast  . Heart disease Sister   . Thyroid disease Sister   . Diabetes Sister   . Breast cancer Daughter        53's, twice diagnosed, double mastectomy  . Breast cancer Daughter        38's     Prior to Admission medications   Medication Sig Start Date End Date Taking? Authorizing Provider  albuterol (PROVENTIL HFA;VENTOLIN HFA) 108 (90 BASE) MCG/ACT inhaler Inhale 2 puffs into the lungs every 6 (six) hours as needed for wheezing or shortness of breath.    [provider]  alendronate (FOSAMAX) 70 MG tablet Take 70 mg by mouth once a week. Take with a full glass of water on an empty stomach.    [provider]  atorvastatin (LIPITOR) 40 MG tablet Take 40 mg by mouth every evening.    [provider]  budesonide-formoterol (SYMBICORT) 160-4.5 MCG/ACT inhaler Inhale 2 puffs into the lungs 2 (two) times daily. 07/27/18   Wilhelmina Mcardle, MD  diltiazem (DILTIAZEM CD) 180 MG 24 hr capsule Take 180 mg by mouth daily.    [provider]  furosemide (LASIX) 20 MG tablet Take 2 tablets (40 mg total) by mouth 2 (two) times daily. Take a second dose at 1-2 PM Patient taking differently: Take 40 mg by mouth daily.  07/27/18   Wilhelmina Mcardle, MD  ipratropium-albuterol  (DUONEB) 0.5-2.5 (3) MG/3ML SOLN Take 3 mLs by nebulization every 4 (four) hours as needed.    [provider]  metolazone (ZAROXOLYN) 2.5 MG tablet Take 2.5 mg by mouth as directed. Pt takes every other Monday. 09/21/18   [provider]  metoprolol succinate (TOPROL-XL) 100 MG 24 hr tablet Take 50 mg by mouth daily. Take with or immediately following a meal.     [provider]  omeprazole (PRILOSEC) 40 MG capsule Take 40 mg by mouth daily.    [provider]  oxybutynin (DITROPAN) 5 MG tablet Take 5 mg by mouth 3 (three) times daily. 01/06/17   [provider]  potassium chloride (KLOR-CON M10) 10 MEQ tablet Take 1 tablet (10 mEq total) by mouth daily. Patient taking differently: Take 20 mEq by mouth daily.  08/16/18   Wilhelmina Mcardle, MD  quinapril (ACCUPRIL) 40  MG tablet Take 40 mg by mouth daily.    [provider]  tiotropium (SPIRIVA) 18 MCG inhalation capsule Place 18 mcg into inhaler and inhale daily.    [provider]  warfarin (COUMADIN) 5 MG tablet Take 5 mg by mouth daily.    [provider]    Physical Exam: Vitals:   02/26/21 1500 02/26/21 1530 02/26/21 1600 02/26/21 1630  BP: 101/75 105/88  111/77  Pulse: 91 100  99  Resp: 15 15 18    Temp:      TempSrc:      SpO2: 96% 96%  97%  Weight:      Height:       General: Not in acute distress HEENT:       Eyes: PERRL, EOMI, no scleral icterus.       ENT: No discharge from the ears and nose, no pharynx injection, no tonsillar enlargement.        Neck: No JVD, no bruit, no mass felt. Heme: No neck lymph node enlargement. Cardiac: S1/S2, RRR, No murmurs, No gallops or rubs. Respiratory: No rales, wheezing, rhonchi or rubs. GI: Soft, nondistended, has tenderness in lower abdomen, no rebound pain, no organomegaly, BS present. GU: No hematuria Ext: has trace leg edema bilaterally. 1+DP/PT pulse bilaterally. Musculoskeletal: No joint deformities, No joint  redness or warmth, no limitation of ROM in spin. Skin: No rashes.  Neuro: Alert, oriented X3, cranial nerves II-XII grossly intact, moves all extremities normally. Psych: Patient is not psychotic, no suicidal or hemocidal ideation.  Labs on Admission: I have personally reviewed following labs and imaging studies  CBC: Recent Labs  Lab 02/26/21 1305  WBC 6.4  HGB 13.7  HCT 42.5  MCV 95.7  PLT 597   Basic Metabolic Panel: Recent Labs  Lab 02/26/21 1305  NA 138  K 4.8  CL 105  CO2 24  GLUCOSE 107*  BUN 37*  CREATININE 1.33*  CALCIUM 9.8   GFR: Estimated Creatinine Clearance: 27.1 mL/min (A) (by C-G formula based on SCr of 1.33 mg/dL (H)). Liver Function Tests: Recent Labs  Lab 02/26/21 1305  AST 28  ALT 16  ALKPHOS 75  BILITOT 1.4*  PROT 7.2  ALBUMIN 4.0   Recent Labs  Lab 02/26/21 1305  LIPASE 44   No results for input(s): AMMONIA in the last 168 hours. Coagulation Profile: Recent Labs  Lab 02/26/21 1305  INR 2.6*   Cardiac Enzymes: No results for input(s): CKTOTAL, CKMB, CKMBINDEX, TROPONINI in the last 168 hours. BNP (last 3 results) No results for input(s): PROBNP in the last 8760 hours. HbA1C: No results for input(s): HGBA1C in the last 72 hours. CBG: No results for input(s): GLUCAP in the last 168 hours. Lipid Profile: No results for input(s): CHOL, HDL, LDLCALC, TRIG, CHOLHDL, LDLDIRECT in the last 72 hours. Thyroid Function Tests: No results for input(s): TSH, T4TOTAL, FREET4, T3FREE, THYROIDAB in the last 72 hours. Anemia Panel: No results for input(s): VITAMINB12, FOLATE, FERRITIN, TIBC, IRON, RETICCTPCT in the last 72 hours. Urine analysis:    Component Value Date/Time   COLORURINE YELLOW (A) 07/31/2017 1953   APPEARANCEUR CLEAR (A) 07/31/2017 1953   LABSPEC 1.010 07/31/2017 1953   PHURINE 6.0 07/31/2017 1953   GLUCOSEU NEGATIVE 07/31/2017 1953   HGBUR NEGATIVE 07/31/2017 1953   BILIRUBINUR NEGATIVE 07/31/2017 1953   KETONESUR  NEGATIVE 07/31/2017 1953   PROTEINUR NEGATIVE 07/31/2017 1953   NITRITE NEGATIVE 07/31/2017 1953   LEUKOCYTESUR TRACE (A) 07/31/2017 1953   Sepsis  Labs: @LABRCNTIP (procalcitonin:4,lacticidven:4) )No results found for this or any previous visit (from the past 240 hour(s)).   Radiological Exams on Admission: CT Abdomen Pelvis Wo Contrast  Result Date: 02/26/2021 CLINICAL DATA:  Acute, non localized abdominal pain. Nausea and diarrhea. EXAM: CT ABDOMEN AND PELVIS WITHOUT CONTRAST TECHNIQUE: Multidetector CT imaging of the abdomen and pelvis was performed following the standard protocol without IV contrast. COMPARISON:  None. FINDINGS: Lower chest: Enlarged heart. Dense atheromatous coronary artery and aortic calcifications. Unremarkable lung bases. Hepatobiliary: Surgically absent gallbladder. Dilated common duct with a maximum diameter of 16.7 mm. No significant intrahepatic ductal dilatation. No visible intraductal stones. Pancreas: Unremarkable. No pancreatic ductal dilatation or surrounding inflammatory changes. Spleen: Normal in size without focal abnormality. Adrenals/Urinary Tract: Adrenal glands are unremarkable. Kidneys are normal, without renal calculi, focal lesion, or hydronephrosis. Bladder is unremarkable. Stomach/Bowel: Dilated stomach and proximal duodenum to the level of the junction of the 2nd and 3rd portions of the duodenum. There is soft tissue thickening at that location, including the region of the ampulla. Minimal sigmoid colon diverticulosis.  No evidence of appendicitis. Vascular/Lymphatic: Extensive atheromatous arterial calcifications, including the origins of both renal arteries, superior mesenteric artery and celiac axis. Reproductive: Status post hysterectomy. No adnexal masses. Other: Small umbilical hernia containing fat. Musculoskeletal: Lumbar and lower thoracic spine degenerative changes. Left hip total prosthesis. IMPRESSION: 1. Dilated stomach and proximal duodenum to  the level of the junction of the 2nd and 3rd portions of the duodenum, possibly due to a duodenal mass at that location. The possible mass could also be obstructing the distal common duct at the ampulla. 2. Extensive calcific coronary artery and aortic atherosclerosis. Electronically Signed   By: Claudie Revering M.D.   On: 02/26/2021 14:48   DG Chest 2 View  Result Date: 02/26/2021 CLINICAL DATA:  Chest pain EXAM: CHEST - 2 VIEW COMPARISON:  12/18/2020 FINDINGS: Chronic interstitial prominence. No new consolidation or edema. Stable elevation of the right hemidiaphragm. No pleural effusion. No pneumothorax. Stable cardiomediastinal contours. Left chest wall dual lead pacemaker. Diffusely decreased osseous mineralization with poor visualization of vertebral bodies. Right shoulder arthroplasty. Left shoulder osteoarthritis. IMPRESSION: No acute process in the chest. Electronically Signed   By: Macy Mis M.D.   On: 02/26/2021 13:57   CT CHEST ABDOMEN PELVIS W CONTRAST  Result Date: 02/26/2021 CLINICAL DATA:  Nausea and left-sided chest pain. EXAM: CT CHEST, ABDOMEN, AND PELVIS WITH CONTRAST TECHNIQUE: Multidetector CT imaging of the chest, abdomen and pelvis was performed following the standard protocol during bolus administration of intravenous contrast. CONTRAST:  67mL OMNIPAQUE IOHEXOL 300 MG/ML  SOLN COMPARISON:  February 26, 2021 FINDINGS: CT CHEST FINDINGS Cardiovascular: There is marked severity calcification of the thoracic aorta. There is mild cardiomegaly with marked severity coronary artery calcification. No pericardial effusion. Mediastinum/Nodes: No enlarged mediastinal, hilar, or axillary lymph nodes. Thyroid gland, trachea, and esophagus demonstrate no significant findings. Lungs/Pleura: Very mild atelectasis is seen within the right lower lobe. There is no evidence of acute infiltrate, pleural effusion or pneumothorax. Musculoskeletal: A right shoulder replacement is seen. Multilevel degenerative  changes seen throughout the thoracic spine. CT ABDOMEN PELVIS FINDINGS Hepatobiliary: No focal liver abnormality is seen. Status post cholecystectomy. The common bile duct is dilated (1.3 cm). Pancreas: Unremarkable. No pancreatic ductal dilatation or surrounding inflammatory changes. Spleen: Normal in size without focal abnormality. Adrenals/Urinary Tract: Adrenal glands are unremarkable. Kidneys are normal, without renal calculi, focal lesion, or hydronephrosis. The urinary bladder is limited in evaluation secondary to overlying  streak artifact. Stomach/Bowel: Stomach is within normal limits. Appendix appears normal. Persistent dilatation of the proximal duodenum is seen to the level of the junction of the second and third portions. Persistent soft tissue thickening is also seen at this level. Noninflamed diverticula are seen within the sigmoid colon. Vascular/Lymphatic: Aortic atherosclerosis. No enlarged abdominal or pelvic lymph nodes. Reproductive: Status post hysterectomy. No adnexal masses. Other: No abdominal wall hernia or abnormality. No abdominopelvic ascites. Musculoskeletal: Multilevel degenerative changes are noted throughout the lumbar spine. IMPRESSION: 1. No acute cardiopulmonary disease. 2. Persistent dilatation of the proximal duodenum which may represent sequelae associated with a duodenal mass. Further evaluation with a GI consult and subsequent endoscopy is recommended. 3. Sigmoid diverticulosis. Electronically Signed   By: Virgina Norfolk M.D.   On: 02/26/2021 16:35     EKG: I have personally reviewed.  Atrial fibrillation, QTC 474, bifascicular block, low voltage  Assessment/Plan Principal Problem:   Abdominal pain Active Problems:   Essential (primary) hypertension   Hyperlipidemia, unspecified   Pacemaker   Atrial fibrillation (HCC)   COPD (chronic obstructive pulmonary disease) (HCC)   DVT (deep venous thrombosis) (HCC)   CAD (coronary artery disease)   Chronic  diastolic CHF (congestive heart failure) (HCC)   Elevated lactic acid level   CKD (chronic kidney disease), stage IIIa   Nausea vomiting and diarrhea   Abdominal pain, nausea, vomiting, diarrhea: CT scan showed possible duodenal mass.  Dr. Dahlia Byes of surgery is consulted.  Dr. Marius Ditch of GI is consulted.  -Admitted to MedSurg bed as inpatient -NG tube: Nurse attempted to put NG tube twice without success.  Consulted IR, Dr. Damita Dunnings -Hold all oral medications -As needed morphine for pain, as needed Zofran for nausea -N.p.o. -IV fluid: 500 cc LR, followed by 50 cc/h  Addendum: per RN report, she has attempted to put NG tube twice without success.  Possibly due to history of esophageal stricture.  I consulted IR, Dr. Earleen Newport, who states that they will not do this procedure.  I have communicated with Dr. Dahlia Byes, he recommended keeping patient n.p.o. tonight and he will try to put NG tube in tomorrow morning.  Then I consulted ICU, [place] NP who has kindly agreed to try to place NG tube, which is highly appreciated.  Essential (primary) hypertension -Hold home blood pressure medications -IV metoprolol 2.5 mg every 8 hours with holding parameter -IV hydralazine as needed  Hyperlipidemia, unspecified -Hold Lipitor  Atrial fibrillation (HCC) -Switch Coumadin to IV heparin -IV metoprolol as above  DVT: -IV heparin as above  COPD (chronic obstructive pulmonary disease) ([place]): Stable -Bronchodilators  CAD (coronary artery disease): Patient states she had 1 episode of chest pain early morning, which has resolved.  Currently patient does not have chest pain.  Troponin level 12, 11 -Hold Lipitor -As needed nitroglycerin  Chronic diastolic CHF (congestive heart failure) ([place]): 2D echo 05/31/2020 showed EF> 55%.  Patient has trace leg edema, no worsening shortness of breath.  No pulm edema chest x-ray.  CHF seem to be compensated. -Hold Lasix and metolazone while patient is on n.p.o. -Check BNP  -->538  Elevated lactic acid level: Lactic acid 2.6.  No signs of infection.  Unlikely to have sepsis.  Likely due to dehydration -IV fluid as above -Trend lactic acid level  CKD (chronic kidney disease), stage IIIa -Follow-up with BMP    DVT ppx: on IV Heparin        Code Status: DNR per pt and her daughter Family Communication:  Yes, patient's daughter    at bed side Disposition Plan:  Anticipate discharge back to previous environment Consults called: Dr. Dahlia Byes of surgery, Dr. Marius Ditch of GI and Dr. Damita Dunnings of IR  Admission status and Level of care: Med-Surg:    as inpt     Status is: Inpatient  Remains inpatient appropriate because:Inpatient level of care appropriate due to severity of illness   Dispo: The patient is from: Home              Anticipated d/c is to: Home              Patient currently is not medically stable to d/c.   Difficult to place patient No          Date of Service 02/26/2021    Ivor Costa [facility]   If 7PM-7AM, please contact night-coverage [email or web address] 02/26/2021, 5:57 PM

## 2021-02-26 NOTE — Progress Notes (Signed)
Patient arrived to the unit from the ED with family at the bedside, vitals taken, &  tele box [id number] applied and verified. MD aware of chest pain 2/10.

## 2021-02-26 NOTE — Consult Note (Signed)
Patient ID: [name] [name], female   DOB: [birth year], 87 y.o.   MRN: [id number]  HPI [name] [name] is a 87 y.o. female in consultation at the request of Dr. Charna Archer for abdominal distension, nausea and diarrhea.  She reports that her symptoms started last couple of weeks and they were intermittent.  Yesterday she had significant vomiting and diarrhea.  She also has associated pain that is in the left side of her abdomen that is intermittent colicky type and last for about half an hour.  Nuys any hematemesis or melena.  He denies any fevers any chills no evidence of bili obstruction.  CT scan of the abdomen pelvis personally reviewed showing evidence of a periampullary lesion concerning for malignancy causing potential partial biliary obstruction and gastric outlet obstruction.  There is normal jejunum present ligament of [place]. She is on Coumadin and INR is 2.6, CBC is normal and CMP shows chronic kidney disease with a creatinine of 1.3, bilirubin is 1.4 rest of CMP is normal. She lives alone but does have significant family support.  She is able to walk only for a few feet and then get shortness of breath.  She does have dyspnea on exertion.  When she shops she uses an Transport planner.  Mentally she is pristine and she is competent and able to make all her decisions She sees Dr. Saralyn Pilar and Dr. Raul Del   HPI  Past Medical History:  Diagnosis Date  . Anemia   . Arrhythmia    atrial fibrillation  . Asthma   . Breast cancer ([place]) 1978   left  . CAD (coronary artery disease)   . CHF (congestive heart failure) ([place])   . COPD (chronic obstructive pulmonary disease) ([place])   . DVT (deep venous thrombosis) ([place])   . Esophageal stricture   . Hemorrhoids   . History of knee replacement, total   . Hyperlipidemia   . Hypertension   . Ischemic cardiomyopathy   . Osteoporosis   . Paroxysmal atrial fibrillation (HCC)   . Peripheral arterial disease ([place])   . Sick sinus syndrome [facility])     Past  Surgical History:  Procedure Laterality Date  . ABDOMINAL HYSTERECTOMY  1985  . HEMORRHOID SURGERY    . ILIAC ARTERY STENT    . INTRAOPERATIVE ARTERIOGRAM Right   . MASTECTOMY Left 1978  . PACEMAKER INSERTION Left 01/11/2020   Procedure: PACEMAKER GENERATOR CHANGE OUT;  Surgeon: Isaias Cowman, MD;  Location: [facility] ORS;  Service: Cardiovascular;  Laterality: Left;  . REDUCTION MAMMAPLASTY Right 1985   impant placed then removed    Family History  Problem Relation Age of Onset  . Hypertension Father   . Heart disease Father   . Clotting disorder Father   . Cancer Mother        breast  . Heart disease Sister   . Thyroid disease Sister   . Diabetes Sister   . Breast cancer Daughter        74's, twice diagnosed, double mastectomy  . Breast cancer Daughter        31's    Social History Social History   Tobacco Use  . Smoking status: Former Smoker    Types: Cigarettes    Quit date: 12/05/1986    Years since quitting: 34.2  . Smokeless tobacco: Never Used  Vaping Use  . Vaping Use: Never used  Substance Use Topics  . Alcohol use: No    Alcohol/week: 0.0 standard drinks  . Drug use: No  Allergies  Allergen Reactions  . Penicillins Anaphylaxis  . Amoxicillin   . Beta Adrenergic Blockers   . Meloxicam     Other reaction(s): Unknown  . Naproxen     Other reaction(s): Unknown  . Nsaids     Other reaction(s): Unknown    Current Facility-Administered Medications  Medication Dose Route Frequency Provider Last Rate Last Admin  . acetaminophen (TYLENOL) 160 MG/5ML solution 650 mg  650 mg Oral Q6H PRN Ivor Costa, MD      . albuterol (VENTOLIN HFA) 108 (90 Base) MCG/ACT inhaler 2 puff  2 puff Inhalation Q4H PRN Ivor Costa, MD      . hydrALAZINE (APRESOLINE) injection 5 mg  5 mg Intravenous Q2H PRN Ivor Costa, MD      . morphine 2 MG/ML injection 0.5 mg  0.5 mg Intravenous Q3H PRN Ivor Costa, MD      . ondansetron (ZOFRAN) injection 4 mg  4 mg Intravenous Q8H PRN  Ivor Costa, MD       Current Outpatient Medications  Medication Sig Dispense Refill  . albuterol (PROVENTIL HFA;VENTOLIN HFA) 108 (90 BASE) MCG/ACT inhaler Inhale 2 puffs into the lungs every 6 (six) hours as needed for wheezing or shortness of breath.    Marland Kitchen alendronate (FOSAMAX) 70 MG tablet Take 70 mg by mouth once a week. Take with a full glass of water on an empty stomach.    Marland Kitchen atorvastatin (LIPITOR) 40 MG tablet Take 40 mg by mouth every evening.    . budesonide-formoterol (SYMBICORT) 160-4.5 MCG/ACT inhaler Inhale 2 puffs into the lungs 2 (two) times daily. 1 Inhaler 5  . diltiazem (DILTIAZEM CD) 180 MG 24 hr capsule Take 180 mg by mouth daily.    . furosemide (LASIX) 20 MG tablet Take 2 tablets (40 mg total) by mouth 2 (two) times daily. Take a second dose at 1-2 PM (Patient taking differently: Take 40 mg by mouth daily. ) 30 tablet 5  . ipratropium-albuterol (DUONEB) 0.5-2.5 (3) MG/3ML SOLN Take 3 mLs by nebulization every 4 (four) hours as needed.    . metolazone (ZAROXOLYN) 2.5 MG tablet Take 2.5 mg by mouth as directed. Pt takes every other Monday.    . metoprolol succinate (TOPROL-XL) 100 MG 24 hr tablet Take 50 mg by mouth daily. Take with or immediately following a meal.     . omeprazole (PRILOSEC) 40 MG capsule Take 40 mg by mouth daily.    Marland Kitchen oxybutynin (DITROPAN) 5 MG tablet Take 5 mg by mouth 3 (three) times daily.    . potassium chloride (KLOR-CON M10) 10 MEQ tablet Take 1 tablet (10 mEq total) by mouth daily. (Patient taking differently: Take 20 mEq by mouth daily. ) 90 tablet 1  . quinapril (ACCUPRIL) 40 MG tablet Take 40 mg by mouth daily.    Marland Kitchen tiotropium (SPIRIVA) 18 MCG inhalation capsule Place 18 mcg into inhaler and inhale daily.    Marland Kitchen warfarin (COUMADIN) 5 MG tablet Take 5 mg by mouth daily.       Review of Systems Full ROS  was asked and was negative except for the information on the HPI  Physical Exam Blood pressure 100/79, pulse 100, temperature 98.6 F (37 C),  temperature source Oral, resp. rate 17, height 5\' 3"  (1.6 m), weight 77.1 kg, SpO2 96 %. CONSTITUTIONAL: NAD EYES: Pupils are equal, round,  Sclera are non-icteric. EARS, NOSE, MOUTH AND THROAT: Wearing a mask, Hearing is intact to voice. LYMPH NODES:  Lymph nodes in the neck  are normal. RESPIRATORY:  Lungs are clear. There is normal respiratory effort, with equal breath sounds bilaterally, and without pathologic use of accessory muscles. CARDIOVASCULAR: Heart is regular without murmurs, gallops, or rubs. Pacemaker on her left chest wall GI: The abdomen is  soft, non tenderdistended, no peritonitis . There are no palpable masses. There is no hepatosplenomegaly. There are normal bowel sounds in all quadrants. GU: Rectal deferred.   MUSCULOSKELETAL: Normal muscle strength and tone. No cyanosis or edema.   SKIN: Turgor is good and there are no pathologic skin lesions or ulcers. NEUROLOGIC: Motor and sensation is grossly normal. Cranial nerves are grossly intact. PSYCH:  Oriented to person, place and time. Affect is normal.  Data Reviewed  I have personally reviewed the patient's imaging, laboratory findings and medical records.    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  vERY Difficult situation since the patient is 38, has COPD, CAD, a fib, she is anticoagulated and has multiple medical issues that make her very high risk for any surgical intervention.. We will need to establish a diagnosis on admit her for NG decompression and serial abdominal examinations.  They do not seem too keen on pursuing any surgical intervention at this time but they also wish to know a more certain diagnosis.  Will definitely continue to follow.  Unable to do MRCP given that she has a pacemaker.   Caroleen Hamman, MD FACS General Surgeon 02/26/2021, 4:30 PM

## 2021-02-26 NOTE — ED Provider Notes (Signed)
[facility] Emergency Department Provider Note   ____________________________________________   Event Date/Time   First MD Initiated Contact with Patient 02/26/21 1302     (approximate)  I have reviewed the triage vital signs and the nursing notes.   HISTORY  Chief Complaint Nausea, Diarrhea, and Chest Pain    HPI [name] [name] is a 87 y.o. female with past medical history of hypertension, CAD, CHF, COPD, paroxysmal atrial fibrillation on Coumadin, and sick sinus syndrome status post pacemaker who presents to the ED complaining of nausea, diarrhea, and chest pain.  Patient states that she has been dealing with intermittent nausea and diarrhea for the past couple of weeks, but this seemed to get more severe overnight when she had both vomiting and diarrhea.  This was associated with some sharp pain in the left side of her chest which lasted for about 30 minutes before resolving.  She denies any fevers, cough, or shortness of breath.  She has not noticed any blood in her emesis or stool.  She does endorse some diffuse abdominal discomfort which has been present over the past 24 hours.        Past Medical History:  Diagnosis Date  . Anemia   . Arrhythmia    atrial fibrillation  . Asthma   . Breast cancer ([place]) 1978   left  . CAD (coronary artery disease)   . CHF (congestive heart failure) ([place])   . COPD (chronic obstructive pulmonary disease) ([place])   . DVT (deep venous thrombosis) ([place])   . Esophageal stricture   . Hemorrhoids   . History of knee replacement, total   . Hyperlipidemia   . Hypertension   . Ischemic cardiomyopathy   . Osteoporosis   . Paroxysmal atrial fibrillation (HCC)   . Peripheral arterial disease ([place])   . Sick sinus syndrome [facility])     Patient Active Problem List   Diagnosis Date Noted  . Abdominal pain 02/26/2021  . COPD (chronic obstructive pulmonary disease) ([place])   . DVT (deep venous thrombosis) ([place])   . CAD  (coronary artery disease)   . Chronic diastolic CHF (congestive heart failure) ([place])   . Elevated lactic acid level   . Esophageal stricture 07/12/2018  . Osteoporosis 07/12/2018  . Urge urinary incontinence 07/12/2018  . Hypersensitivity pneumonitis due to unspecified organic dust ([place]) 05/18/2018  . VHD (valvular heart disease) 05/18/2018  . Chest discomfort 03/16/2017  . Atrial fibrillation ([place]) 03/23/2015  . SOB (shortness of breath) 03/06/2014  . Essential (primary) hypertension 12/29/2013  . Hyperlipidemia, unspecified 12/29/2013  . Pacemaker 10/26/2007    Past Surgical History:  Procedure Laterality Date  . ABDOMINAL HYSTERECTOMY  1985  . HEMORRHOID SURGERY    . ILIAC ARTERY STENT    . INTRAOPERATIVE ARTERIOGRAM Right   . MASTECTOMY Left 1978  . PACEMAKER INSERTION Left 01/11/2020   Procedure: PACEMAKER GENERATOR CHANGE OUT;  Surgeon: Isaias Cowman, MD;  Location: [facility] ORS;  Service: Cardiovascular;  Laterality: Left;  . REDUCTION MAMMAPLASTY Right 1985   impant placed then removed    Prior to Admission medications   Medication Sig Start Date End Date Taking? Authorizing Provider  albuterol (PROVENTIL HFA;VENTOLIN HFA) 108 (90 BASE) MCG/ACT inhaler Inhale 2 puffs into the lungs every 6 (six) hours as needed for wheezing or shortness of breath.    [provider]  alendronate (FOSAMAX) 70 MG tablet Take 70 mg by mouth once a week. Take with a full glass of water on an  empty stomach.    [provider]  atorvastatin (LIPITOR) 40 MG tablet Take 40 mg by mouth every evening.    [provider]  budesonide-formoterol (SYMBICORT) 160-4.5 MCG/ACT inhaler Inhale 2 puffs into the lungs 2 (two) times daily. 07/27/18   Wilhelmina Mcardle, MD  diltiazem (DILTIAZEM CD) 180 MG 24 hr capsule Take 180 mg by mouth daily.    [provider]  furosemide (LASIX) 20 MG tablet Take 2 tablets (40 mg total) by mouth 2 (two) times daily. Take a second dose at  1-2 PM Patient taking differently: Take 40 mg by mouth daily.  07/27/18   Wilhelmina Mcardle, MD  ipratropium-albuterol (DUONEB) 0.5-2.5 (3) MG/3ML SOLN Take 3 mLs by nebulization every 4 (four) hours as needed.    [provider]  metolazone (ZAROXOLYN) 2.5 MG tablet Take 2.5 mg by mouth as directed. Pt takes every other Monday. 09/21/18   [provider]  metoprolol succinate (TOPROL-XL) 100 MG 24 hr tablet Take 50 mg by mouth daily. Take with or immediately following a meal.     [provider]  omeprazole (PRILOSEC) 40 MG capsule Take 40 mg by mouth daily.    [provider]  oxybutynin (DITROPAN) 5 MG tablet Take 5 mg by mouth 3 (three) times daily. 01/06/17   [provider]  potassium chloride (KLOR-CON M10) 10 MEQ tablet Take 1 tablet (10 mEq total) by mouth daily. Patient taking differently: Take 20 mEq by mouth daily.  08/16/18   Wilhelmina Mcardle, MD  quinapril (ACCUPRIL) 40 MG tablet Take 40 mg by mouth daily.    [provider]  tiotropium (SPIRIVA) 18 MCG inhalation capsule Place 18 mcg into inhaler and inhale daily.    [provider]  warfarin (COUMADIN) 5 MG tablet Take 5 mg by mouth daily.    [provider]    Allergies Penicillins, Amoxicillin, Beta adrenergic blockers, Meloxicam, Naproxen, and Nsaids  Family History  Problem Relation Age of Onset  . Hypertension Father   . Heart disease Father   . Clotting disorder Father   . Cancer Mother        breast  . Heart disease Sister   . Thyroid disease Sister   . Diabetes Sister   . Breast cancer Daughter        40's, twice diagnosed, double mastectomy  . Breast cancer Daughter        46's    Social History Social History   Tobacco Use  . Smoking status: Former Smoker    Types: Cigarettes    Quit date: 12/05/1986    Years since quitting: 34.2  . Smokeless tobacco: Never Used  Vaping Use  . Vaping Use: Never used  Substance Use Topics  .  Alcohol use: No    Alcohol/week: 0.0 standard drinks  . Drug use: No    Review of Systems  Constitutional: No fever/chills Eyes: No visual changes. ENT: No sore throat. Cardiovascular: Positive for chest pain. Respiratory: Denies shortness of breath. Gastrointestinal: Positive for abdominal pain, nausea, vomiting, and diarrhea.  No constipation. Genitourinary: Negative for dysuria. Musculoskeletal: Negative for back pain. Skin: Negative for rash. Neurological: Negative for headaches, focal weakness or numbness.  ____________________________________________   PHYSICAL EXAM:  VITAL SIGNS: ED Triage Vitals  Enc Vitals Group     BP 02/26/21 1303 128/72     Pulse Rate 02/26/21 1303 (!) 106     Resp 02/26/21 1303 20     Temp 02/26/21  1303 98.6 F (37 C)     Temp Source 02/26/21 1303 Oral     SpO2 02/26/21 1303 94 %     Weight 02/26/21 1301 169 lb 15.6 oz (77.1 kg)     Height 02/26/21 1301 5\' 3"  (1.6 m)     Head Circumference --      Peak Flow --      Pain Score 02/26/21 1302 0     Pain Loc --      Pain Edu? --      Excl. in [place]? --     Constitutional: Alert and oriented. Eyes: Conjunctivae are normal. Head: Atraumatic. Nose: No congestion/rhinnorhea. Mouth/Throat: Mucous membranes are moist. Neck: Normal ROM Cardiovascular: Normal rate, irregularly irregular rhythm. Grossly normal heart sounds.  2+ radial pulses bilaterally. Respiratory: Normal respiratory effort.  No retractions. Lungs CTAB. Gastrointestinal: Soft and diffusely tender to palpation with no rebound or guarding. No distention. Genitourinary: deferred Musculoskeletal: No lower extremity tenderness nor edema. Neurologic:  Normal speech and language. No gross focal neurologic deficits are appreciated. Skin:  Skin is warm, dry and intact. No rash noted. Psychiatric: Mood and affect are normal. Speech and behavior are normal.  ____________________________________________   LABS (all labs ordered are  listed, but only abnormal results are displayed)  Labs Reviewed  COMPREHENSIVE METABOLIC PANEL - Abnormal; Notable for the following components:      Result Value   Glucose, Bld 107 (*)    BUN 37 (*)    Creatinine, Ser 1.33 (*)    Total Bilirubin 1.4 (*)    GFR, Estimated 38 (*)    All other components within normal limits  PROTIME-INR - Abnormal; Notable for the following components:   Prothrombin Time 27.5 (*)    INR 2.6 (*)    All other components within normal limits  SARS CORONAVIRUS 2 (TAT 6-24 HRS)  LIPASE, BLOOD  CBC  URINALYSIS, COMPLETE (UACMP) WITH MICROSCOPIC  TROPONIN I (HIGH SENSITIVITY)  TROPONIN I (HIGH SENSITIVITY)   ____________________________________________  EKG  ED ECG REPORT I, Blake Divine, the attending physician, personally viewed and interpreted this ECG.   Date: 02/26/2021  EKG Time: 13:05  Rate: 98  Rhythm: atrial fibrillation  Axis: Normal  Intervals:right bundle branch block  ST&T Change: Lateral T wave inversions, similar to previous   PROCEDURES  Procedure(s) performed (including Critical Care):  Procedures   ____________________________________________   INITIAL IMPRESSION / ASSESSMENT AND PLAN / ED COURSE       87 year old female with past medical history of hypertension, CAD, CHF, COPD, atrial fibrillation on Coumadin, and sick sinus syndrome status post pacemaker who presents to the ED for acute on chronic vomiting and diarrhea associated with diffuse abdominal pain.  Patient does have diffuse tenderness on exam, while symptoms sound most consistent with gastroenteritis, given her advanced age we will further assess with CT scan.  Labs and UA are pending at this time, we will treat with IV fluids and Zofran.  Patient additionally complained of an episode of chest pain last night that has since resolved.  EKG shows atrial fibrillation with lateral T wave inversions similar to previous.  We will check heart enzymes and observe  on cardiac monitor but overall low suspicion for ACS.  Troponin within normal limits, low suspicion of cardiac etiology for patient's prior chest pain.  She did begin actively vomiting here in the ED, although this improved with IV Zofran.  CT scan remarkable for dilated stomach and proximal duodenum with questionable mass in  the area of the mid duodenum.  Findings discussed with Dr. Dahlia Byes of general surgery, who recommends proceeding with NG tube.  He further discussed findings with Dr. Pascal Lux of radiology and we will proceed with CT of chest/abdomen/pelvis pancreatic protocol to further assess potential mass.  Case discussed with hospitalist for admission.      ____________________________________________   FINAL CLINICAL IMPRESSION(S) / ED DIAGNOSES  Final diagnoses:  Nausea and vomiting  Partial intestinal obstruction, unspecified cause [facility])     ED Discharge Orders    None       Note:  This document was prepared using Dragon voice recognition software and may include unintentional dictation errors.   Blake Divine, MD 02/26/21 [phone]

## 2021-02-26 NOTE — Consult Note (Signed)
[place] for Heparin Indication: VTE treatment/hx of DVT/afib  Allergies  Allergen Reactions  . Penicillins Anaphylaxis  . Amoxicillin   . Beta Adrenergic Blockers   . Meloxicam     Other reaction(s): Unknown  . Naproxen     Other reaction(s): Unknown  . Nsaids     Other reaction(s): Unknown    Patient Measurements: Height: 5\' 3"  (160 cm) Weight: 77.1 kg (169 lb 15.6 oz) IBW/kg (Calculated) : 52.4 Heparin Dosing Weight: 69 kg  Vital Signs: Temp: 98.6 F (37 C) (06/07 1303) Temp Source: Oral (06/07 1303) BP: 100/79 (06/07 1430) Pulse Rate: 100 (06/07 1430)  Labs: Recent Labs    02/26/21 1305  HGB 13.7  HCT 42.5  PLT 185  LABPROT 27.5*  INR 2.6*  CREATININE 1.33*  TROPONINIHS 12    Estimated Creatinine Clearance: 27.1 mL/min (A) (by C-G formula based on SCr of 1.33 mg/dL (H)).   Medical History: Past Medical History:  Diagnosis Date  . Anemia   . Arrhythmia    atrial fibrillation  . Asthma   . Breast cancer ([place]) 1978   left  . CAD (coronary artery disease)   . CHF (congestive heart failure) ([place])   . COPD (chronic obstructive pulmonary disease) ([place])   . DVT (deep venous thrombosis) ([place])   . Esophageal stricture   . Hemorrhoids   . History of knee replacement, total   . Hyperlipidemia   . Hypertension   . Ischemic cardiomyopathy   . Osteoporosis   . Paroxysmal atrial fibrillation (HCC)   . Peripheral arterial disease ([place])   . Sick sinus syndrome (HCC)     Medications:  (Not in a hospital admission)  Scheduled:   Infusions:   PRN: acetaminophen (TYLENOL) oral liquid 160 mg/5 mL, albuterol, hydrALAZINE, morphine injection, ondansetron (ZOFRAN) IV Anti-infectives (From admission, onward)   None      Assessment: Pharmacy consulted to start heparin for VTE treatment. Noted patient has a hx of DVTs. Pt is on warfarin for afib.   Goal of Therapy:  Heparin level 0.3-0.7 units/ml Monitor platelets  by anticoagulation protocol: Yes   Plan:  Holding heparin, until INR is subtherapeutic.   Oswald Hillock 02/26/2021,4:18 PM

## 2021-02-26 NOTE — ED Notes (Signed)
Attempted placement of NG tube twice. Pt unable to tolerate. Pt kept panicking and then started vomiting again. Attending Adrian notified via secure chat.

## 2021-02-26 NOTE — ED Notes (Signed)
Dietitian setting up transport now. Floor RN confirms pt ok to go ahead upstairs.

## 2021-02-26 NOTE — ED Notes (Signed)
See triage note. Pt reports had PM batteries replaced about a month ago. Pt reports neck soreness from fall a year ago. Denies jaw, shoulder blade, arm pain/numbness. Daughter at bedside.

## 2021-02-26 NOTE — ED Notes (Signed)
Pt left for imaging. Will complete further orders once pt back to room.

## 2021-02-26 NOTE — Progress Notes (Addendum)
I was requested by the patient's attending to assist with NGT placement after multiple attempts by the RN failed. I explained the procedure in details and rationale for NGT for decompression. She verbalized understanding but was hesitant to proceed with NGT placement prior to talking to her primary care physician Dr. Ginette Pitman. Per patient, she was informed by her primary care physician that she is not a candidate for any type of procedures due her multiple co-morbidities including COPD which places her at high risk for related complications. She would like to hold off until she talks to her daughter and PCP tomorrow. Patient informed me that if she changes her mind tonight she will let me know. I discussed this with her attending Dr. Blaine Hamper.    Rufina Falco, DNP, CCRN, FNP-C, AGACNP-BC Acute Care Nurse Practitioner  [facility] Pager: [phone] [place] [facility]

## 2021-02-26 NOTE — ED Triage Notes (Signed)
Pt c/o nausea "for a while", and diarrhea. Pt c/o L sided CP that just started at approx midnight and lasted approx 30 minutes. Pt denies CP at this time.

## 2021-02-26 NOTE — ED Notes (Signed)
Pt denies burning upon urination and back pain. Pt generally tender in abdomen. EDP [place] at bedside.

## 2021-02-27 ENCOUNTER — Inpatient Hospital Stay: Payer: PPO

## 2021-02-27 ENCOUNTER — Ambulatory Visit: Payer: PPO | Admitting: Internal Medicine

## 2021-02-27 DIAGNOSIS — R112 Nausea with vomiting, unspecified: Secondary | ICD-10-CM

## 2021-02-27 DIAGNOSIS — R17 Unspecified jaundice: Secondary | ICD-10-CM

## 2021-02-27 DIAGNOSIS — K3189 Other diseases of stomach and duodenum: Secondary | ICD-10-CM

## 2021-02-27 DIAGNOSIS — R791 Abnormal coagulation profile: Secondary | ICD-10-CM

## 2021-02-27 LAB — CBC
HCT: 40.4 % (ref 36.0–46.0)
Hemoglobin: 13.6 g/dL (ref 12.0–15.0)
MCH: 31.3 pg (ref 26.0–34.0)
MCHC: 33.7 g/dL (ref 30.0–36.0)
MCV: 93.1 fL (ref 80.0–100.0)
Platelets: 178 10*3/uL (ref 150–400)
RBC: 4.34 MIL/uL (ref 3.87–5.11)
RDW: 15 % (ref 11.5–15.5)
WBC: 7.7 10*3/uL (ref 4.0–10.5)
nRBC: 0 % (ref 0.0–0.2)

## 2021-02-27 LAB — BASIC METABOLIC PANEL
Anion gap: 12 (ref 5–15)
BUN: 35 mg/dL — ABNORMAL HIGH (ref 8–23)
CO2: 26 mmol/L (ref 22–32)
Calcium: 9.8 mg/dL (ref 8.9–10.3)
Chloride: 102 mmol/L (ref 98–111)
Creatinine, Ser: 1.46 mg/dL — ABNORMAL HIGH (ref 0.44–1.00)
GFR, Estimated: 34 mL/min — ABNORMAL LOW (ref >60)
Glucose, Bld: 119 mg/dL — ABNORMAL HIGH (ref 70–99)
Potassium: 4.5 mmol/L (ref 3.5–5.1)
Sodium: 140 mmol/L (ref 135–145)

## 2021-02-27 LAB — PROTIME-INR
INR: 2.4 — ABNORMAL HIGH (ref 0.8–1.2)
Prothrombin Time: 26.4 seconds — ABNORMAL HIGH (ref 11.4–15.2)

## 2021-02-27 LAB — BILIRUBIN, TOTAL: Total Bilirubin: 1.3 mg/dL — ABNORMAL HIGH (ref 0.3–1.2)

## 2021-02-27 LAB — MAGNESIUM: Magnesium: 2.2 mg/dL (ref 1.7–2.4)

## 2021-02-27 LAB — GLUCOSE, CAPILLARY: Glucose-Capillary: 120 mg/dL — ABNORMAL HIGH (ref 70–99)

## 2021-02-27 MED ORDER — SODIUM CHLORIDE 0.9 % IV SOLN
12.5000 mg | Freq: Three times a day (TID) | INTRAVENOUS | Status: DC | PRN
  Filled 2021-02-27: qty 0.5

## 2021-02-27 MED ORDER — ALPRAZOLAM 0.5 MG PO TABS
0.2500 mg | ORAL_TABLET | Freq: Once | ORAL | Status: AC
  Administered 2021-02-27: 0.25 mg via ORAL
  Filled 2021-02-27: qty 1

## 2021-02-27 MED ORDER — METOPROLOL TARTRATE 5 MG/5ML IV SOLN
5.0000 mg | Freq: Three times a day (TID) | INTRAVENOUS | Status: DC
  Administered 2021-02-27 – 2021-03-01 (×5): 5 mg via INTRAVENOUS
  Filled 2021-02-27 (×5): qty 5

## 2021-02-27 MED ORDER — ALPRAZOLAM 0.25 MG PO TABS
0.2500 mg | ORAL_TABLET | Freq: Three times a day (TID) | ORAL | Status: DC | PRN
  Administered 2021-02-27 – 2021-03-01 (×3): 0.25 mg via ORAL
  Filled 2021-02-27 (×3): qty 1

## 2021-02-27 NOTE — Progress Notes (Signed)
Patient experienced an episode of emesis during the shift. Green and yellowish fluids are noted. Patient states she feels relived after expelling fluids. Pt skin tone is more of a yellow tone than during the beginning of the shift.

## 2021-02-27 NOTE — Progress Notes (Signed)
Mobility Specialist - Progress Note   02/27/21 1200  Mobility  Activity Ambulated in hall  Level of Assistance Standby assist, set-up cues, supervision of patient - no hands on  Assistive Device None  Distance Ambulated (ft) 60 ft  Mobility Ambulated with assistance in hallway  Mobility Response Tolerated well  Mobility performed by Mobility specialist  $Mobility charge 1 Mobility    Pre-mobility: 107 HR, 94% SpO2 Post-mobility: 108 HR, 92% SpO2   Pt lying in bed upon arrival utilizing RA. Family at bedside. Pt modI for bed mobility. Denied dizziness. Pt does have 2 incontinent episodes during session. Mobility cleaned floor and assisted pt with gown change. Ambulated in hallway without AD. No LOB. Does report nausea, medication given prior to session per RN. Mild SOB during activity, O2 difficult to determine d/t poor pleth. Pt ambulated to bathroom prior to return to bed. Independent in peri-care.   Kathee Delton Mobility Specialist 02/27/21, 12:36 PM

## 2021-02-27 NOTE — Progress Notes (Signed)
PROGRESS NOTE  [name] [name] TRV:[id number] DOB: [birth year]   PCP: Tracie Harrier, MD  Patient is from: Home  DOA: 02/26/2021 LOS: 1  Chief complaints: Abdominal pain  Brief Narrative / Interim history: 87 year old F with PMH of COPD, CKD-3A, SSS s/p PPM, DVT on warfarin, CAD, diastolic CHF, HTN, HLD, GERD, PVD and remote left breast cancer presenting with abdominal pain, intermittent nausea, vomiting and diarrhea for about 2 weeks.  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  GI and general surgery consulted.  NGT insertion was unsuccessful.  Patient was kept n.p.o. overnight.  Patient was evaluated by GI and general surgery.  Abdominal film with nonobstructive bowel gas pattern.  GI planning EGD once INR less than 1.5.  Subjective: Seen and examined earlier this morning.  No major events overnight of this morning.  Patient reports improvement in nausea, emesis and abdominal pain.  She denies chest pain or dyspnea.  Denies UTI symptoms.  Patient's daughter at bedside.  Objective: Vitals:   02/27/21 0931 02/27/21 1123 02/27/21 1504 02/27/21 1513  BP: 123/74 133/77 137/81   Pulse: (!) 104 92 (!) 112 (!) 105  Resp: 18     Temp: 97.7 F (36.5 C) 97.8 F (36.6 C) 98.6 F (37 C)   TempSrc: Oral Oral Oral   SpO2: 100% 95% 97%   Weight:      Height:        Intake/Output Summary (Last 24 hours) at 02/27/2021 1623 Last data filed at 02/27/2021 1500 Gross per 24 hour  Intake 125.74 ml  Output --  Net 125.74 ml   Filed Weights   02/26/21 1301  Weight: 77.1 kg    Examination:  GENERAL: No apparent distress.  Nontoxic. HEENT: MMM.  Vision and hearing grossly intact.  NECK: Supple.  No apparent JVD.  RESP:  No IWOB.  Fair aeration bilaterally. CVS:  RRR. Heart sounds normal.  ABD/GI/GU: BS+. Abd soft, NTND.  MSK/EXT:  Moves extremities. No apparent  deformity. No edema.  SKIN: no apparent skin lesion or wound NEURO: Awake, alert and oriented appropriately.  No apparent focal neuro deficit. PSYCH: Calm. Normal affect.   Procedures:  None  Microbiology summarized: [id number] and influenza PCR nonreactive.  Assessment & Plan: Abdominal pain, nausea, vomiting and diarrhea in the setting of duodenal mass: CT shows possible duodenal mass at the junction of second and third part causing some degree of obstructive change proximally.  GI symptoms improved.  KUB with nonobstructive bowel gas pattern.  -Plan for EGD once INR less than 1.5 -Okay for sips with meds per surgery -Continue IV fluid -Continue as needed antiemetics -Continue holding warfarin  Persistent A. fib: EKG in A. fib with HR in the 100s -Increase IV metoprolol to 5 mg every 8 hours -On IV heparin.  Warfarin on hold.  Essential (primary) hypertension: Normotensive -IV metoprolol as above. -IV hydralazine as needed  DVT: On warfarin at home. -IV heparin as above  COPD (chronic obstructive pulmonary disease) ([place]): Stable -Bronchodilators  CAD (coronary artery disease): Stable. -As needed nitro  Chronic diastolic CHF (congestive heart failure) ([place]): TTE in 05/2020 with LVEF> 55% and moderate TVR.  Overall, patient appears euvolemic. -Continue holding home Lasix and metolazone while NPO. -Continue gentle IV fluids -Closely monitor fluid and respiratory status while on IV fluid.  Elevated lactic acid level: Lactic acid 2.6.  Likely due to dehydration  in the setting of #1 -IV fluid as above  CKD-3A: Cr slightly higher than baseline. Recent Labs    08/29/20 1213 12/18/20 1419 02/26/21 1305 02/27/21 0429  BUN 27* 38* 37* 35*  CREATININE 1.19* 1.31* 1.33* 1.46*  -Continue holding home diuretics -IV fluid as above  Hyperlipidemia, unspecified -Hold Lipitor  Body mass index is 30.11 kg/m.         DVT prophylaxis:  On IV heparin for DVT and A.  fib  Code Status: DNR/DNI Family Communication: Updated patient's daughter at bedside Level of care: Med-Surg Status is: Inpatient  Remains inpatient appropriate because:Ongoing diagnostic testing needed not appropriate for outpatient work up, IV treatments appropriate due to intensity of illness or inability to take PO and Inpatient level of care appropriate due to severity of illness   Dispo: The patient is from: Home              Anticipated d/c is to: To be determined              Patient currently is not medically stable to d/c.   Difficult to place patient No       Consultants:  General surgery Gastroenterology   Sch Meds:  Scheduled Meds: . metoprolol tartrate  2.5 mg Intravenous Q8H  . mometasone-formoterol  2 puff Inhalation BID  . nitroGLYCERIN  0.4 mg Sublingual UD   Continuous Infusions: . lactated ringers 50 mL/hr at 02/27/21 0940   PRN Meds:.acetaminophen (TYLENOL) oral liquid 160 mg/5 mL, albuterol, hydrALAZINE, ipratropium-albuterol, morphine injection, ondansetron (ZOFRAN) IV  Antimicrobials: Anti-infectives (From admission, onward)   None       I have personally reviewed the following labs and images: CBC: Recent Labs  Lab 02/26/21 1305 02/27/21 0429  WBC 6.4 7.7  HGB 13.7 13.6  HCT 42.5 40.4  MCV 95.7 93.1  PLT 185 178   BMP &GFR Recent Labs  Lab 02/26/21 1305 02/27/21 0429  NA 138 140  K 4.8 4.5  CL 105 102  CO2 24 26  GLUCOSE 107* 119*  BUN 37* 35*  CREATININE 1.33* 1.46*  CALCIUM 9.8 9.8  MG  --  2.2   Estimated Creatinine Clearance: 24.7 mL/min (A) (by C-G formula based on SCr of 1.46 mg/dL (H)). Liver & Pancreas: Recent Labs  Lab 02/26/21 1305 02/27/21 0429  AST 28  --   ALT 16  --   ALKPHOS 75  --   BILITOT 1.4* 1.3*  PROT 7.2  --   ALBUMIN 4.0  --    Recent Labs  Lab 02/26/21 1305  LIPASE 44   No results for input(s): AMMONIA in the last 168 hours. Diabetic: No results for input(s): HGBA1C in the last  72 hours. Recent Labs  Lab 02/27/21 0755  GLUCAP 120*   Cardiac Enzymes: No results for input(s): CKTOTAL, CKMB, CKMBINDEX, TROPONINI in the last 168 hours. No results for input(s): PROBNP in the last 8760 hours. Coagulation Profile: Recent Labs  Lab 02/26/21 1305 02/27/21 0429  INR 2.6* 2.4*   Thyroid Function Tests: No results for input(s): TSH, T4TOTAL, FREET4, T3FREE, THYROIDAB in the last 72 hours. Lipid Profile: No results for input(s): CHOL, HDL, LDLCALC, TRIG, CHOLHDL, LDLDIRECT in the last 72 hours. Anemia Panel: No results for input(s): VITAMINB12, FOLATE, FERRITIN, TIBC, IRON, RETICCTPCT in the last 72 hours. Urine analysis:    Component Value Date/Time   COLORURINE YELLOW (A) 02/26/2021 1651   APPEARANCEUR HAZY (A) 02/26/2021 1651   LABSPEC 1.040 (H) 02/26/2021 1651  PHURINE 5.0 02/26/2021 [place] 02/26/2021 1651   HGBUR NEGATIVE 02/26/2021 1651   BILIRUBINUR NEGATIVE 02/26/2021 1651   KETONESUR NEGATIVE 02/26/2021 1651   PROTEINUR NEGATIVE 02/26/2021 1651   NITRITE NEGATIVE 02/26/2021 1651   LEUKOCYTESUR TRACE (A) 02/26/2021 1651   Sepsis Labs: Invalid input(s): PROCALCITONIN, [place]  Microbiology: Recent Results (from the past 240 hour(s))  SARS CORONAVIRUS 2 (TAT 6-24 HRS) Nasopharyngeal Nasopharyngeal Swab     Status: None   Collection Time: 02/26/21  2:32 PM   Specimen: Nasopharyngeal Swab  Result Value Ref Range Status   SARS Coronavirus 2 NEGATIVE NEGATIVE Final    Comment: (NOTE) SARS-CoV-2 target nucleic acids are NOT DETECTED.  The SARS-CoV-2 RNA is generally detectable in upper and lower respiratory specimens during the acute phase of infection. Negative results do not preclude SARS-CoV-2 infection, do not rule out co-infections with other pathogens, and should not be used as the sole basis for treatment or other patient management decisions. Negative results must be combined with clinical observations, patient  history, and epidemiological information. The expected result is Negative.  Fact Sheet for Patients: SugarRoll.be  Fact Sheet for Healthcare Providers: https://www.woods-mathews.com/  This test is not yet approved or cleared by the [place] FDA and  has been authorized for detection and/or diagnosis of SARS-CoV-2 by FDA under an Emergency Use Authorization (EUA). This EUA will remain  in effect (meaning this test can be used) for the duration of the COVID-19 declaration under Se ction 564(b)(1) of the Act, 21 U.S.C. section 360bbb-3(b)(1), unless the authorization is terminated or revoked sooner.  Performed at [facility], [place] [address], [place], [place] [address]     Radiology Studies: DG Abd Portable 1V  Result Date: 02/27/2021 CLINICAL DATA:  Abdominal distension EXAM: PORTABLE ABDOMEN - 1 VIEW COMPARISON:  Portable exam 0902 hours without priors for comparison FINDINGS: Contrast material within kidneys, renal collecting systems, and bladder. Nonobstructive bowel gas pattern. No bowel dilatation or bowel wall thickening. Osseous demineralization with multilevel degenerative disc and facet disease changes of thoracolumbar spine. Atherosclerotic calcification aorta. LEFT hip prosthesis. IMPRESSION: Nonobstructive bowel gas pattern. Aortic Atherosclerosis (ICD10-I70.0). Electronically Signed   By: Lavonia Dana M.D.   On: 02/27/2021 11:00     Jihad Brownlow T. Douglas  If 7PM-7AM, please contact night-coverage [email or web address] 02/27/2021, 4:23 PM

## 2021-02-27 NOTE — Progress Notes (Signed)
[facility] SURGICAL PROGRESS NOTE (cpt [phone])  Hospital Day(s): 1.   Interval History: Patient seen and examined, no acute events or new complaints overnight. Patient reports she had a rough night secondary to nausea and emesis but this morning feels much better. She reports that her abdominal distension has improved, no abdominal pain, and is without any nausea this morning. She remains without leukocytosis, 7.7K. Renal function remains elevated but stable, sCr - 1.46, UO - unmeasured. No significant electrolyte derangements. NGT placement was unsuccessful overnight.   Review of Systems:  Constitutional: denies fever, chills  HEENT: denies cough or congestion  Respiratory: denies any shortness of breath  Cardiovascular: denies chest pain or palpitations  Gastrointestinal: denies abdominal pain, N/V, or diarrhea/and bowel function as per interval history Genitourinary: denies burning with urination or urinary frequency   Vital signs in last 24 hours: [min-max] current  Temp:  [98.3 F (36.8 C)-98.6 F (37 C)] 98.6 F (37 C) (06/08 0505) Pulse Rate:  [71-106] 102 (06/08 0505) Resp:  [14-20] 18 (06/08 0505) BP: (100-137)/(64-96) 131/84 (06/08 0505) SpO2:  [94 %-100 %] 96 % (06/08 0505) Weight:  [77.1 kg] 77.1 kg (06/07 1301)     Height: 5\' 3"  (160 cm) Weight: 77.1 kg BMI (Calculated): 30.12   Intake/Output last 2 shifts:  No intake/output data recorded.   Physical Exam:  Constitutional: alert, cooperative and no distress, resting comfortably in chair, daughter at bedside HENT: normocephalic without obvious abnormality  Eyes: PERRL, EOM's grossly intact and symmetric  Respiratory: breathing non-labored at rest  Cardiovascular: regular rate and sinus rhythm  Gastrointestinal: soft, non-tender, and non-distended, no rebound/guarding Musculoskeletal: no edema or wounds, motor and sensation grossly intact, NT    Labs:  CBC Latest Ref Rng & Units 02/27/2021 02/26/2021  12/18/2020  WBC 4.0 - 10.5 K/uL 7.7 6.4 7.9  Hemoglobin 12.0 - 15.0 g/dL 13.6 13.7 11.9(L)  Hematocrit 36.0 - 46.0 % 40.4 42.5 37.2  Platelets 150 - 400 K/uL 178 185 165   CMP Latest Ref Rng & Units 02/27/2021 02/26/2021 12/18/2020  Glucose 70 - 99 mg/dL 119(H) 107(H) 126(H)  BUN 8 - 23 mg/dL 35(H) 37(H) 38(H)  Creatinine 0.44 - 1.00 mg/dL 1.46(H) 1.33(H) 1.31(H)  Sodium 135 - 145 mmol/L 140 138 139  Potassium 3.5 - 5.1 mmol/L 4.5 4.8 4.5  Chloride 98 - 111 mmol/L 102 105 103  CO2 22 - 32 mmol/L 26 24 27   Calcium 8.9 - 10.3 mg/dL 9.8 9.8 9.3  Total Protein 6.5 - 8.1 g/dL - 7.2 -  Total Bilirubin 0.3 - 1.2 mg/dL - 1.4(H) -  Alkaline Phos 38 - 126 U/L - 75 -  AST 15 - 41 U/L - 28 -  ALT 0 - 44 U/L - 16 -     Imaging studies: No new pertinent imaging studies   Assessment/Plan: (ICD-10's: K31.89) 87 y.o. 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 I will repeat KUB this morning. If stomach remains distended, I will attempt NGT placement. If unsuccessful, will follow up with IR placement (she refused sedation last night)  -  Agree with need for GI consultation for endoscopic evaluation for formal diagnosis  - This is a rather challenging situation given her age and comorbid conditions. There are potential surgical options (ex; biliary stent; gastrojejunostomy); however, these are rather extensive and family is not keen on pursuing surgery  - NPO for now  - IVF resuscitation  - Monitor abdominal examination  -  Pain control prn; antiemetics prn  - Further management per primary service; we will of course follow along    All of the above findings and recommendations were discussed with the patient, patient's family (daughter at bedside), and the medical team, and all of patient's and family's questions were answered to their expressed satisfaction.  -- Edison Simon, PA-C [facility] 02/27/2021, 7:19 AM [phone] M-F: 7am - 4pm

## 2021-02-27 NOTE — Consult Note (Signed)
[name] for Heparin Indication: VTE treatment/hx of DVT/afib  Allergies  Allergen Reactions  . Penicillins Anaphylaxis  . Amoxicillin   . Beta Adrenergic Blockers   . Meloxicam     Other reaction(s): Unknown  . Naproxen     Other reaction(s): Unknown  . Nsaids     Other reaction(s): Unknown    Patient Measurements: Height: 5\' 3"  (160 cm) Weight: 77.1 kg (169 lb 15.6 oz) IBW/kg (Calculated) : 52.4 Heparin Dosing Weight: 69 kg  Vital Signs: Temp: 98.6 F (37 C) (06/08 0505) Temp Source: Oral (06/08 0505) BP: 131/84 (06/08 0505) Pulse Rate: 102 (06/08 0505)  Labs: Recent Labs    02/26/21 1305 02/26/21 1651 02/26/21 1923 02/26/21 2206 02/27/21 0429  HGB 13.7  --   --   --  13.6  HCT 42.5  --   --   --  40.4  PLT 185  --   --   --  178  APTT  --   --  37*  --   --   LABPROT 27.5*  --   --   --  26.4*  INR 2.6*  --   --   --  2.4*  CREATININE 1.33*  --   --   --  1.46*  TROPONINIHS 12 11 13 16   --     Estimated Creatinine Clearance: 24.7 mL/min (A) (by C-G formula based on SCr of 1.46 mg/dL (H)).   Medical History: Past Medical History:  Diagnosis Date  . Anemia   . Arrhythmia    atrial fibrillation  . Asthma   . Breast cancer ([place]) 1978   left  . CAD (coronary artery disease)   . CHF (congestive heart failure) ([place])   . COPD (chronic obstructive pulmonary disease) ([place])   . DVT (deep venous thrombosis) ([place])   . Esophageal stricture   . Hemorrhoids   . History of knee replacement, total   . Hyperlipidemia   . Hypertension   . Ischemic cardiomyopathy   . Osteoporosis   . Paroxysmal atrial fibrillation ([facility])   . Peripheral arterial disease ([place])   . Sick sinus syndrome ([facility])     Medications:  Medications Prior to Admission  Medication Sig Dispense Refill Last Dose  . atorvastatin (LIPITOR) 40 MG tablet Take 40 mg by mouth every evening.   02/25/2021 at 2000  . budesonide-formoterol (SYMBICORT) 160-4.5  MCG/ACT inhaler Inhale 2 puffs into the lungs 2 (two) times daily. 1 Inhaler 5 02/25/2021 at 2000  . DALIRESP 250 MCG TABS Take 250 mcg by mouth daily.   02/26/2021 at 0900  . diltiazem (CARDIZEM CD) 180 MG 24 hr capsule Take 180 mg by mouth daily.   02/26/2021 at 0900  . escitalopram (LEXAPRO) 5 MG tablet Take 5 mg by mouth daily.   02/26/2021 at 0900  . furosemide (LASIX) 40 MG tablet Take 40 mg by mouth daily.   02/26/2021 at 0900  . ipratropium-albuterol (DUONEB) 0.5-2.5 (3) MG/3ML SOLN Take 3 mLs by nebulization every 4 (four) hours as needed.   02/25/2021 at prn  . metolazone (ZAROXOLYN) 2.5 MG tablet Take 2.5 mg by mouth as directed. Pt takes every Monday.   02/25/2021 at 0900  . metoprolol succinate (TOPROL-XL) 50 MG 24 hr tablet Take 50 mg by mouth daily.   02/26/2021 at 0900  . nitroGLYCERIN (NITROSTAT) 0.4 MG SL tablet Place 0.4 mg under the tongue as directed.   prn at prn  . oxybutynin (DITROPAN) 5  MG tablet Take 5 mg by mouth 3 (three) times daily.   02/26/2021 at 0900  . Potassium Chloride ER 20 MEQ TBCR Take 20 tablets by mouth 2 (two) times daily.   02/26/2021 at 0900  . quinapril (ACCUPRIL) 40 MG tablet Take 40 mg by mouth daily.   02/26/2021 at 0900  . tiotropium (SPIRIVA) 18 MCG inhalation capsule Place 18 mcg into inhaler and inhale daily.   02/25/2021 at Unknown time  . warfarin (COUMADIN) 5 MG tablet Take 5 mg by mouth daily.   02/25/2021 at 1800  . albuterol (PROVENTIL HFA;VENTOLIN HFA) 108 (90 BASE) MCG/ACT inhaler Inhale 2 puffs into the lungs every 6 (six) hours as needed for wheezing or shortness of breath.   unknown at prn   Scheduled:  . metoprolol tartrate  2.5 mg Intravenous Q8H  . mometasone-formoterol  2 puff Inhalation BID  . nitroGLYCERIN  0.4 mg Sublingual UD   Infusions:  . lactated ringers     PRN: acetaminophen (TYLENOL) oral liquid 160 mg/5 mL, albuterol, hydrALAZINE, ipratropium-albuterol, morphine injection, ondansetron (ZOFRAN) IV Anti-infectives (From admission, onward)    None      Assessment: Pharmacy consulted to start heparin for VTE treatment. Noted patient has a hx of DVTs. Pt is on warfarin for afib.   Goal of Therapy:  Heparin level 0.3-0.7 units/ml Monitor platelets by anticoagulation protocol: Yes   Plan:  Holding heparin, until INR is subtherapeutic - 2.4 today.   [name] [name], PharmD, BCPS Clinical Pharmacist 02/27/2021 8:00 AM

## 2021-02-27 NOTE — Plan of Care (Signed)

## 2021-02-27 NOTE — Consult Note (Addendum)
Vonda Antigua, MD [address] [place], [place], [place], [address] [address], [place], [place], [place], [address] Phone: [phone]  Fax: [phone]  Consultation  Referring Provider:     Dr. Cyndia Skeeters Primary Care Physician:  Tracie Harrier, MD Reason for Consultation:     Nausea/vomiting  Date of Admission:  02/26/2021 Date of Consultation:  02/27/2021         HPI:   [name] [name] is a 87 y.o. female with 3 to 4-week history of nausea vomiting, no hematemesis, bilateral lower quadrant abdominal pain, with no previous history of similar symptoms.  Patient describes pain as dull, 5 / 10, nonradiating, with no aggravating or relieving factors.  No prior upper endoscopy.  Patient reports history of colonoscopy over 10 years ago.  Results not available.  Imaging on admission shows dilatation of the proximal duodenum, and stomach, and possible duodenal mass  Patient reports that the last time she had an episode of emesis was around 4 AM today, which was about 5 hours ago and has not had any further episodes since then.  NG tube was recommended by surgery team last night on admission, but was unable to be placed and primary team is planning on coordinating with IR for NG tube placement  Patient is also on Coumadin and has an elevated INR  There is history of esophageal stricture mentioned under her past medical history, and I asked the patient and her daughter about this, and they are not aware of any history of esophageal strictures.  She does state that about 2 years ago she had what she describes as irritation in her throat around the time of diagnosis of her COPD, but never had any upper endoscopies or any other interventions done for this.  I am unable to find any records specifically about any esophageal strictures in the past and no prior esophagram, swallow study, or EGDs, or GI notes available in her chart.  Past Medical History:  Diagnosis Date  . Anemia   .  Arrhythmia    atrial fibrillation  . Asthma   . Breast cancer ([facility]) 1978   left  . CAD (coronary artery disease)   . CHF (congestive heart failure) ([facility])   . COPD (chronic obstructive pulmonary disease) ([facility])   . DVT (deep venous thrombosis) ([facility])   . Esophageal stricture   . Hemorrhoids   . History of knee replacement, total   . Hyperlipidemia   . Hypertension   . Ischemic cardiomyopathy   . Osteoporosis   . Paroxysmal atrial fibrillation ([facility])   . Peripheral arterial disease ([facility])   . Sick sinus syndrome [facility])     Past Surgical History:  Procedure Laterality Date  . ABDOMINAL HYSTERECTOMY  1985  . HEMORRHOID SURGERY    . ILIAC ARTERY STENT    . INTRAOPERATIVE ARTERIOGRAM Right   . MASTECTOMY Left 1978  . PACEMAKER INSERTION Left 01/11/2020   Procedure: PACEMAKER GENERATOR CHANGE OUT;  Surgeon: Isaias Cowman, MD;  Location: [facility] ORS;  Service: Cardiovascular;  Laterality: Left;  . REDUCTION MAMMAPLASTY Right 1985   impant placed then removed    Prior to Admission medications   Medication Sig Start Date End Date Taking? Authorizing Provider  atorvastatin (LIPITOR) 40 MG tablet Take 40 mg by mouth every evening.   Yes [provider]  budesonide-formoterol (SYMBICORT) 160-4.5 MCG/ACT inhaler Inhale 2 puffs into the lungs 2 (two) times daily. 07/27/18  Yes Wilhelmina Mcardle, MD  DALIRESP 250 MCG  TABS Take 250 mcg by mouth daily. 02/11/21  Yes [provider]  diltiazem (CARDIZEM CD) 180 MG 24 hr capsule Take 180 mg by mouth daily.   Yes [provider]  escitalopram (LEXAPRO) 5 MG tablet Take 5 mg by mouth daily. 02/07/21  Yes [provider]  furosemide (LASIX) 40 MG tablet Take 40 mg by mouth daily. 02/13/21  Yes [provider]  ipratropium-albuterol (DUONEB) 0.5-2.5 (3) MG/3ML SOLN Take 3 mLs by nebulization every 4 (four) hours as needed.   Yes [provider]  metolazone (ZAROXOLYN) 2.5 MG tablet Take 2.5 mg by  mouth as directed. Pt takes every Monday. 09/21/18  Yes [provider]  metoprolol succinate (TOPROL-XL) 50 MG 24 hr tablet Take 50 mg by mouth daily. 01/20/21  Yes [provider]  nitroGLYCERIN (NITROSTAT) 0.4 MG SL tablet Place 0.4 mg under the tongue as directed. 11/01/20  Yes [provider]  oxybutynin (DITROPAN) 5 MG tablet Take 5 mg by mouth 3 (three) times daily. 01/06/17  Yes [provider]  Potassium Chloride ER 20 MEQ TBCR Take 20 tablets by mouth 2 (two) times daily. 01/20/21  Yes [provider]  quinapril (ACCUPRIL) 40 MG tablet Take 40 mg by mouth daily.   Yes [provider]  tiotropium (SPIRIVA) 18 MCG inhalation capsule Place 18 mcg into inhaler and inhale daily.   Yes [provider]  warfarin (COUMADIN) 5 MG tablet Take 5 mg by mouth daily.   Yes [provider]  albuterol (PROVENTIL HFA;VENTOLIN HFA) 108 (90 BASE) MCG/ACT inhaler Inhale 2 puffs into the lungs every 6 (six) hours as needed for wheezing or shortness of breath.    [provider]    Family History  Problem Relation Age of Onset  . Hypertension Father   . Heart disease Father   . Clotting disorder Father   . Cancer Mother        breast  . Heart disease Sister   . Thyroid disease Sister   . Diabetes Sister   . Breast cancer Daughter        9's, twice diagnosed, double mastectomy  . Breast cancer Daughter        73's     Social History   Tobacco Use  . Smoking status: Former Smoker    Types: Cigarettes    Quit date: 12/05/1986    Years since quitting: 34.2  . Smokeless tobacco: Never Used  Vaping Use  . Vaping Use: Never used  Substance Use Topics  . Alcohol use: No    Alcohol/week: 0.0 standard drinks  . Drug use: No    Allergies as of 02/26/2021 - Review Complete 02/26/2021  Allergen Reaction Noted  . Penicillins Anaphylaxis 11/21/2014  . Amoxicillin  08/29/2020  . Beta adrenergic blockers  05/03/2020  .  Meloxicam  12/29/2013  . Naproxen  12/29/2013  . Nsaids  12/29/2013    Review of Systems:    All systems reviewed and negative except where noted in HPI.   Physical Exam:  Vital signs in last 24 hours: Vitals:   02/26/21 2011 02/27/21 0230 02/27/21 0505 02/27/21 0931  BP:  137/78 131/84 123/74  Pulse:  93 (!) 102 (!) 104  Resp:   18 18  Temp:  98.3 F (36.8 C) 98.6 F (37 C) 97.7 F (36.5 C)  TempSrc:  Oral Oral Oral  SpO2: 96%  96% 100%  Weight:      Height:  Last BM Date: 02/26/21 General:   Pleasant, cooperative in NAD Head:  Normocephalic and atraumatic. Eyes:   No icterus.   Conjunctiva pink. PERRLA. Ears:  Normal auditory acuity. Neck:  Supple; no masses or thyroidomegaly Lungs: Respirations even and unlabored. Lungs clear to auscultation bilaterally.   No wheezes, crackles, or rhonchi.  Abdomen:  Soft, nondistended, nontender. Normal bowel sounds. No appreciable masses or hepatomegaly.  No rebound or guarding.  Neurologic:  Alert and oriented x3;  grossly normal neurologically. Skin:  Intact without significant lesions or rashes. Cervical Nodes:  No significant cervical adenopathy. Psych:  Alert and cooperative. Normal affect.  LAB RESULTS: Recent Labs    02/26/21 1305 02/27/21 0429  WBC 6.4 7.7  HGB 13.7 13.6  HCT 42.5 40.4  PLT 185 178   BMET Recent Labs    02/26/21 1305 02/27/21 0429  NA 138 140  K 4.8 4.5  CL 105 102  CO2 24 26  GLUCOSE 107* 119*  BUN 37* 35*  CREATININE 1.33* 1.46*  CALCIUM 9.8 9.8   LFT Recent Labs    02/26/21 1305  PROT 7.2  ALBUMIN 4.0  AST 28  ALT 16  ALKPHOS 75  BILITOT 1.4*   PT/INR Recent Labs    02/26/21 1305 02/27/21 0429  LABPROT 27.5* 26.4*  INR 2.6* 2.4*    STUDIES: CT Abdomen Pelvis Wo Contrast  Result Date: 02/26/2021 CLINICAL DATA:  Acute, non localized abdominal pain. Nausea and diarrhea. EXAM: CT ABDOMEN AND PELVIS WITHOUT CONTRAST TECHNIQUE: Multidetector CT imaging of the abdomen  and pelvis was performed following the standard protocol without IV contrast. COMPARISON:  None. FINDINGS: Lower chest: Enlarged heart. Dense atheromatous coronary artery and aortic calcifications. Unremarkable lung bases. Hepatobiliary: Surgically absent gallbladder. Dilated common duct with a maximum diameter of 16.7 mm. No significant intrahepatic ductal dilatation. No visible intraductal stones. Pancreas: Unremarkable. No pancreatic ductal dilatation or surrounding inflammatory changes. Spleen: Normal in size without focal abnormality. Adrenals/Urinary Tract: Adrenal glands are unremarkable. Kidneys are normal, without renal calculi, focal lesion, or hydronephrosis. Bladder is unremarkable. Stomach/Bowel: Dilated stomach and proximal duodenum to the level of the junction of the 2nd and 3rd portions of the duodenum. There is soft tissue thickening at that location, including the region of the ampulla. Minimal sigmoid colon diverticulosis.  No evidence of appendicitis. Vascular/Lymphatic: Extensive atheromatous arterial calcifications, including the origins of both renal arteries, superior mesenteric artery and celiac axis. Reproductive: Status post hysterectomy. No adnexal masses. Other: Small umbilical hernia containing fat. Musculoskeletal: Lumbar and lower thoracic spine degenerative changes. Left hip total prosthesis. IMPRESSION: 1. Dilated stomach and proximal duodenum to the level of the junction of the 2nd and 3rd portions of the duodenum, possibly due to a duodenal mass at that location. The possible mass could also be obstructing the distal common duct at the ampulla. 2. Extensive calcific coronary artery and aortic atherosclerosis. Electronically Signed   By: Claudie Revering M.D.   On: 02/26/2021 14:48   DG Chest 2 View  Result Date: 02/26/2021 CLINICAL DATA:  Chest pain EXAM: CHEST - 2 VIEW COMPARISON:  12/18/2020 FINDINGS: Chronic interstitial prominence. No new consolidation or edema. Stable  elevation of the right hemidiaphragm. No pleural effusion. No pneumothorax. Stable cardiomediastinal contours. Left chest wall dual lead pacemaker. Diffusely decreased osseous mineralization with poor visualization of vertebral bodies. Right shoulder arthroplasty. Left shoulder osteoarthritis. IMPRESSION: No acute process in the chest. Electronically Signed   By: Macy Mis M.D.   On: 02/26/2021 13:57  CT CHEST ABDOMEN PELVIS W CONTRAST  Result Date: 02/26/2021 CLINICAL DATA:  Nausea and left-sided chest pain. EXAM: CT CHEST, ABDOMEN, AND PELVIS WITH CONTRAST TECHNIQUE: Multidetector CT imaging of the chest, abdomen and pelvis was performed following the standard protocol during bolus administration of intravenous contrast. CONTRAST:  7m OMNIPAQUE IOHEXOL 300 MG/ML  SOLN COMPARISON:  February 26, 2021 FINDINGS: CT CHEST FINDINGS Cardiovascular: There is marked severity calcification of the thoracic aorta. There is mild cardiomegaly with marked severity coronary artery calcification. No pericardial effusion. Mediastinum/Nodes: No enlarged mediastinal, hilar, or axillary lymph nodes. Thyroid gland, trachea, and esophagus demonstrate no significant findings. Lungs/Pleura: Very mild atelectasis is seen within the right lower lobe. There is no evidence of acute infiltrate, pleural effusion or pneumothorax. Musculoskeletal: A right shoulder replacement is seen. Multilevel degenerative changes seen throughout the thoracic spine. CT ABDOMEN PELVIS FINDINGS Hepatobiliary: No focal liver abnormality is seen. Status post cholecystectomy. The common bile duct is dilated (1.3 cm). Pancreas: Unremarkable. No pancreatic ductal dilatation or surrounding inflammatory changes. Spleen: Normal in size without focal abnormality. Adrenals/Urinary Tract: Adrenal glands are unremarkable. Kidneys are normal, without renal calculi, focal lesion, or hydronephrosis. The urinary bladder is limited in evaluation secondary to overlying  streak artifact. Stomach/Bowel: Stomach is within normal limits. Appendix appears normal. Persistent dilatation of the proximal duodenum is seen to the level of the junction of the second and third portions. Persistent soft tissue thickening is also seen at this level. Noninflamed diverticula are seen within the sigmoid colon. Vascular/Lymphatic: Aortic atherosclerosis. No enlarged abdominal or pelvic lymph nodes. Reproductive: Status post hysterectomy. No adnexal masses. Other: No abdominal wall hernia or abnormality. No abdominopelvic ascites. Musculoskeletal: Multilevel degenerative changes are noted throughout the lumbar spine. IMPRESSION: 1. No acute cardiopulmonary disease. 2. Persistent dilatation of the proximal duodenum which may represent sequelae associated with a duodenal mass. Further evaluation with a GI consult and subsequent endoscopy is recommended. 3. Sigmoid diverticulosis. Electronically Signed   By: TVirgina NorfolkM.D.   On: 02/26/2021 16:35      Impression / Plan:   RAANSHI BATCHELDERis a 87y.o. y/o female with nausea vomiting for 3 to 4 weeks with imaging showing gastric and duodenal distention with a possible duodenal mass  Patient will need upper endoscopy for evaluation of the duodenal mass  However, her INR will need to be less than 1.5 to be able to obtain biopsies during the procedure  Would recommend vitamin K or FFP's and upper endoscopy can be done in 1 to 2 days depending on INR levels  Surgery is following and given her gastric distention, NG tube with IR is pending  Patient and family at bedside and with like to proceed with endoscopic procedures for further evaluation of CT findings and her symptoms  Her bilirubin was mildly elevated yesterday.  I have added on recheck bilirubin to her morning labs today.  Her alk phos was normal.  She is not having symptoms of biliary obstruction at this time.  However, if bilirubin is elevated again, we will need to either  fractionate, or consider further imaging  I have discussed alternative options, risks & benefits,  which include, but are not limited to, bleeding, infection, perforation,respiratory complication & drug reaction.  The patient agrees with this plan & written consent will be obtained.     Thank you for involving me in the care of this patient.      LOS: 1 day   VVirgel Manifold MD  02/27/2021,  9:51 AM

## 2021-02-28 ENCOUNTER — Inpatient Hospital Stay: Payer: PPO

## 2021-02-28 DIAGNOSIS — R197 Diarrhea, unspecified: Secondary | ICD-10-CM

## 2021-02-28 DIAGNOSIS — I5032 Chronic diastolic (congestive) heart failure: Secondary | ICD-10-CM

## 2021-02-28 DIAGNOSIS — I4819 Other persistent atrial fibrillation: Secondary | ICD-10-CM

## 2021-02-28 DIAGNOSIS — N1831 Chronic kidney disease, stage 3a: Secondary | ICD-10-CM

## 2021-02-28 DIAGNOSIS — Z950 Presence of cardiac pacemaker: Secondary | ICD-10-CM

## 2021-02-28 DIAGNOSIS — I824Y9 Acute embolism and thrombosis of unspecified deep veins of unspecified proximal lower extremity: Secondary | ICD-10-CM

## 2021-02-28 DIAGNOSIS — R1084 Generalized abdominal pain: Secondary | ICD-10-CM

## 2021-02-28 DIAGNOSIS — E785 Hyperlipidemia, unspecified: Secondary | ICD-10-CM

## 2021-02-28 LAB — COMPREHENSIVE METABOLIC PANEL
ALT: 15 U/L (ref 0–44)
AST: 27 U/L (ref 15–41)
Albumin: 4.2 g/dL (ref 3.5–5.0)
Alkaline Phosphatase: 75 U/L (ref 38–126)
Anion gap: 11 (ref 5–15)
BUN: 38 mg/dL — ABNORMAL HIGH (ref 8–23)
CO2: 29 mmol/L (ref 22–32)
Calcium: 9.9 mg/dL (ref 8.9–10.3)
Chloride: 103 mmol/L (ref 98–111)
Creatinine, Ser: 1.52 mg/dL — ABNORMAL HIGH (ref 0.44–1.00)
GFR, Estimated: 32 mL/min — ABNORMAL LOW (ref >60)
Glucose, Bld: 95 mg/dL (ref 70–99)
Potassium: 4.3 mmol/L (ref 3.5–5.1)
Sodium: 143 mmol/L (ref 135–145)
Total Bilirubin: 1.8 mg/dL — ABNORMAL HIGH (ref 0.3–1.2)
Total Protein: 7.4 g/dL (ref 6.5–8.1)

## 2021-02-28 LAB — CBC
HCT: 43 % (ref 36.0–46.0)
Hemoglobin: 14.2 g/dL (ref 12.0–15.0)
MCH: 31.6 pg (ref 26.0–34.0)
MCHC: 33 g/dL (ref 30.0–36.0)
MCV: 95.8 fL (ref 80.0–100.0)
Platelets: 169 10*3/uL (ref 150–400)
RBC: 4.49 MIL/uL (ref 3.87–5.11)
RDW: 15 % (ref 11.5–15.5)
WBC: 9 10*3/uL (ref 4.0–10.5)
nRBC: 0 % (ref 0.0–0.2)

## 2021-02-28 LAB — GLUCOSE, CAPILLARY: Glucose-Capillary: 93 mg/dL (ref 70–99)

## 2021-02-28 LAB — MAGNESIUM: Magnesium: 2.2 mg/dL (ref 1.7–2.4)

## 2021-02-28 LAB — BILIRUBIN, FRACTIONATED(TOT/DIR/INDIR)
Bilirubin, Direct: 0.4 mg/dL — ABNORMAL HIGH (ref 0.0–0.2)
Indirect Bilirubin: 1.4 mg/dL — ABNORMAL HIGH (ref 0.3–0.9)
Total Bilirubin: 1.8 mg/dL — ABNORMAL HIGH (ref 0.3–1.2)

## 2021-02-28 LAB — PROTIME-INR
INR: 2.3 — ABNORMAL HIGH (ref 0.8–1.2)
Prothrombin Time: 25 seconds — ABNORMAL HIGH (ref 11.4–15.2)

## 2021-02-28 LAB — PHOSPHORUS: Phosphorus: 4.9 mg/dL — ABNORMAL HIGH (ref 2.5–4.6)

## 2021-02-28 MED ORDER — VITAMIN K1 10 MG/ML IJ SOLN
1.0000 mg | Freq: Once | INTRAVENOUS | Status: AC
  Administered 2021-02-28: 1 mg via INTRAVENOUS
  Filled 2021-02-28: qty 0.1

## 2021-02-28 MED ORDER — PHYTONADIONE 5 MG PO TABS
2.5000 mg | ORAL_TABLET | Freq: Once | ORAL | Status: AC
  Administered 2021-02-28: 2.5 mg via ORAL
  Filled 2021-02-28: qty 1

## 2021-02-28 NOTE — Progress Notes (Signed)
PROGRESS NOTE    [name] [name]  DPO:[id number] DOB: Feb 13, 1927 DOA: 02/26/2021 PCP: Tracie Harrier, MD    Brief Narrative:  87 year old F with PMH of COPD, CKD-3A, SSS s/p PPM, DVT on warfarin, CAD, diastolic CHF, HTN, HLD, GERD, PVD and remote left breast cancer presenting with abdominal pain, intermittent nausea, vomiting and diarrhea for about 2 weeks.  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  GI and general surgery consulted.  NGT insertion was unsuccessful.  Patient was kept n.p.o. overnight.   Patient was evaluated by GI and general surgery.  Abdominal film with nonobstructive bowel gas pattern.  GI planning EGD once INR less than 1.5.  6/9- As I walked in to see pt, daughter at bedside, very angry and rude with me. Angry about multiple things about yesterday. Wanted to know why pt was not told about NGT, and called, and multiple other complaints. Explained to the daughter, I cannot answer for yesterdays events pertaining to things I do not know about as I am taking over her moms care today. D/w daughter about why NGT was placed today by surgery , discussed why cant do EGD today due to her INR. Daughter again is angry asking the same questions over despite me explaining things. She is upset with yesterdays MD about not getting called and not being told about NGT. Explained to daughter gsx made the decision today as pt still symptomatic and distended. Unfortunetly nothing I said could make daughter calm down and be less rude and aggressive with me.  Consultants:  GI, surgery  Procedures:   Antimicrobials:      Subjective: Patient has NG tube placed this am. She denies sob, abd pain, n/v. Greenish fluid from ngt  Objective: Vitals:   02/27/21 2058 02/27/21 2357 02/28/21 0508 02/28/21 0710  BP: 140/74 (!) 151/88 (!) 152/91 104/75  Pulse: (!) 106 (!)  110 81 (!) 115  Resp: 18 18 16 18   Temp: 98.3 F (36.8 C) 97.9 F (36.6 C)  97.6 F (36.4 C)  TempSrc: Oral Oral  Oral  SpO2: 93% 100% 95% 94%  Weight:      Height:        Intake/Output Summary (Last 24 hours) at 02/28/2021 0815 Last data filed at 02/27/2021 1800 Gross per 24 hour  Intake 125.74 ml  Output 200 ml  Net -74.26 ml   Filed Weights   02/26/21 1301  Weight: 77.1 kg    Examination:  General exam: Appears calm and comfortable  NGT in palce Respiratory system: Clear to auscultation. Respiratory effort normal. Cardiovascular system: S1 & S2 heard, RRR. No JVD, murmurs, rubs, gallops or clicks. Gastrointestinal system: Abdomen is nondistended, soft and nontender. Normal bowel sounds heard. Central nervous system: Alert and awake Extremities: No edema Skin: warm dry Psychiatry: Mood & affect appropriate.     Data Reviewed: I have personally reviewed following labs and imaging studies  CBC: Recent Labs  Lab 02/26/21 1305 02/27/21 0429 02/28/21 0404  WBC 6.4 7.7 9.0  HGB 13.7 13.6 14.2  HCT 42.5 40.4 43.0  MCV 95.7 93.1 95.8  PLT 185 178 431   Basic Metabolic Panel: Recent Labs  Lab 02/26/21 1305 02/27/21 0429 02/28/21 0404  NA 138 140 143  K 4.8 4.5 4.3  CL 105 102 103  CO2 24 26 29   GLUCOSE 107* 119* 95  BUN 37* 35* 38*  CREATININE 1.33* 1.46* 1.52*  CALCIUM 9.8 9.8 9.9  MG  --  2.2 2.2  PHOS  --   --  4.9*   GFR: Estimated Creatinine Clearance: 23.7 mL/min (A) (by C-G formula based on SCr of 1.52 mg/dL (H)). Liver Function Tests: Recent Labs  Lab 02/26/21 1305 02/27/21 0429 02/28/21 0404  AST 28  --  27  ALT 16  --  15  ALKPHOS 75  --  75  BILITOT 1.4* 1.3* 1.8*  1.8*  PROT 7.2  --  7.4  ALBUMIN 4.0  --  4.2   Recent Labs  Lab 02/26/21 1305  LIPASE 44   No results for input(s): AMMONIA in the last 168 hours. Coagulation Profile: Recent Labs  Lab 02/26/21 1305 02/27/21 0429 02/28/21 0404  INR 2.6* 2.4* 2.3*   Cardiac  Enzymes: No results for input(s): CKTOTAL, CKMB, CKMBINDEX, TROPONINI in the last 168 hours. BNP (last 3 results) No results for input(s): PROBNP in the last 8760 hours. HbA1C: No results for input(s): HGBA1C in the last 72 hours. CBG: Recent Labs  Lab 02/27/21 0755 02/28/21 0725  GLUCAP 120* 93   Lipid Profile: No results for input(s): CHOL, HDL, LDLCALC, TRIG, CHOLHDL, LDLDIRECT in the last 72 hours. Thyroid Function Tests: No results for input(s): TSH, T4TOTAL, FREET4, T3FREE, THYROIDAB in the last 72 hours. Anemia Panel: No results for input(s): VITAMINB12, FOLATE, FERRITIN, TIBC, IRON, RETICCTPCT in the last 72 hours. Sepsis Labs: No results for input(s): PROCALCITON, LATICACIDVEN in the last 168 hours.  Recent Results (from the past 240 hour(s))  SARS CORONAVIRUS 2 (TAT 6-24 HRS) Nasopharyngeal Nasopharyngeal Swab     Status: None   Collection Time: 02/26/21  2:32 PM   Specimen: Nasopharyngeal Swab  Result Value Ref Range Status   SARS Coronavirus 2 NEGATIVE NEGATIVE Final    Comment: (NOTE) SARS-CoV-2 target nucleic acids are NOT DETECTED.  The SARS-CoV-2 RNA is generally detectable in upper and lower respiratory specimens during the acute phase of infection. Negative results do not preclude SARS-CoV-2 infection, do not rule out co-infections with other pathogens, and should not be used as the sole basis for treatment or other patient management decisions. Negative results must be combined with clinical observations, patient history, and epidemiological information. The expected result is Negative.  Fact Sheet for Patients: SugarRoll.be  Fact Sheet for Healthcare Providers: https://www.woods-mathews.com/  This test is not yet approved or cleared by the [place] FDA and  has been authorized for detection and/or diagnosis of SARS-CoV-2 by FDA under an Emergency Use Authorization (EUA). This EUA will remain  in effect  (meaning this test can be used) for the duration of the COVID-19 declaration under Se ction 564(b)(1) of the Act, 21 U.S.C. section 360bbb-3(b)(1), unless the authorization is terminated or revoked sooner.  Performed at [facility], [place] [address] [place], [place] [address]          Radiology Studies: CT Abdomen Pelvis Wo Contrast  Result Date: 02/26/2021 CLINICAL DATA:  Acute, non localized abdominal pain. Nausea and diarrhea. EXAM: CT ABDOMEN AND PELVIS WITHOUT CONTRAST TECHNIQUE: Multidetector CT imaging of the abdomen and pelvis was performed following the standard protocol without IV contrast. COMPARISON:  None. FINDINGS: Lower chest: Enlarged heart. Dense atheromatous coronary artery and aortic calcifications. Unremarkable lung bases. Hepatobiliary: Surgically absent gallbladder. Dilated common duct with a maximum diameter of 16.7 mm. No significant intrahepatic ductal dilatation. No visible intraductal stones. Pancreas: Unremarkable. No pancreatic ductal dilatation or surrounding inflammatory changes. Spleen:  Normal in size without focal abnormality. Adrenals/Urinary Tract: Adrenal glands are unremarkable. Kidneys are normal, without renal calculi, focal lesion, or hydronephrosis. Bladder is unremarkable. Stomach/Bowel: Dilated stomach and proximal duodenum to the level of the junction of the 2nd and 3rd portions of the duodenum. There is soft tissue thickening at that location, including the region of the ampulla. Minimal sigmoid colon diverticulosis.  No evidence of appendicitis. Vascular/Lymphatic: Extensive atheromatous arterial calcifications, including the origins of both renal arteries, superior mesenteric artery and celiac axis. Reproductive: Status post hysterectomy. No adnexal masses. Other: Small umbilical hernia containing fat. Musculoskeletal: Lumbar and lower thoracic spine degenerative changes. Left hip total prosthesis. IMPRESSION: 1. Dilated stomach and proximal  duodenum to the level of the junction of the 2nd and 3rd portions of the duodenum, possibly due to a duodenal mass at that location. The possible mass could also be obstructing the distal common duct at the ampulla. 2. Extensive calcific coronary artery and aortic atherosclerosis. Electronically Signed   By: Claudie Revering M.D.   On: 02/26/2021 14:48   DG Chest 2 View  Result Date: 02/26/2021 CLINICAL DATA:  Chest pain EXAM: CHEST - 2 VIEW COMPARISON:  12/18/2020 FINDINGS: Chronic interstitial prominence. No new consolidation or edema. Stable elevation of the right hemidiaphragm. No pleural effusion. No pneumothorax. Stable cardiomediastinal contours. Left chest wall dual lead pacemaker. Diffusely decreased osseous mineralization with poor visualization of vertebral bodies. Right shoulder arthroplasty. Left shoulder osteoarthritis. IMPRESSION: No acute process in the chest. Electronically Signed   By: Macy Mis M.D.   On: 02/26/2021 13:57   CT CHEST ABDOMEN PELVIS W CONTRAST  Result Date: 02/26/2021 CLINICAL DATA:  Nausea and left-sided chest pain. EXAM: CT CHEST, ABDOMEN, AND PELVIS WITH CONTRAST TECHNIQUE: Multidetector CT imaging of the chest, abdomen and pelvis was performed following the standard protocol during bolus administration of intravenous contrast. CONTRAST:  17mL OMNIPAQUE IOHEXOL 300 MG/ML  SOLN COMPARISON:  February 26, 2021 FINDINGS: CT CHEST FINDINGS Cardiovascular: There is marked severity calcification of the thoracic aorta. There is mild cardiomegaly with marked severity coronary artery calcification. No pericardial effusion. Mediastinum/Nodes: No enlarged mediastinal, hilar, or axillary lymph nodes. Thyroid gland, trachea, and esophagus demonstrate no significant findings. Lungs/Pleura: Very mild atelectasis is seen within the right lower lobe. There is no evidence of acute infiltrate, pleural effusion or pneumothorax. Musculoskeletal: A right shoulder replacement is seen. Multilevel  degenerative changes seen throughout the thoracic spine. CT ABDOMEN PELVIS FINDINGS Hepatobiliary: No focal liver abnormality is seen. Status post cholecystectomy. The common bile duct is dilated (1.3 cm). Pancreas: Unremarkable. No pancreatic ductal dilatation or surrounding inflammatory changes. Spleen: Normal in size without focal abnormality. Adrenals/Urinary Tract: Adrenal glands are unremarkable. Kidneys are normal, without renal calculi, focal lesion, or hydronephrosis. The urinary bladder is limited in evaluation secondary to overlying streak artifact. Stomach/Bowel: Stomach is within normal limits. Appendix appears normal. Persistent dilatation of the proximal duodenum is seen to the level of the junction of the second and third portions. Persistent soft tissue thickening is also seen at this level. Noninflamed diverticula are seen within the sigmoid colon. Vascular/Lymphatic: Aortic atherosclerosis. No enlarged abdominal or pelvic lymph nodes. Reproductive: Status post hysterectomy. No adnexal masses. Other: No abdominal wall hernia or abnormality. No abdominopelvic ascites. Musculoskeletal: Multilevel degenerative changes are noted throughout the lumbar spine. IMPRESSION: 1. No acute cardiopulmonary disease. 2. Persistent dilatation of the proximal duodenum which may represent sequelae associated with a duodenal mass. Further evaluation with a GI consult and subsequent endoscopy is recommended.  3. Sigmoid diverticulosis. Electronically Signed   By: Virgina Norfolk M.D.   On: 02/26/2021 16:35   DG Abd Portable 1V  Result Date: 02/27/2021 CLINICAL DATA:  Abdominal distension EXAM: PORTABLE ABDOMEN - 1 VIEW COMPARISON:  Portable exam 0902 hours without priors for comparison FINDINGS: Contrast material within kidneys, renal collecting systems, and bladder. Nonobstructive bowel gas pattern. No bowel dilatation or bowel wall thickening. Osseous demineralization with multilevel degenerative disc and facet  disease changes of thoracolumbar spine. Atherosclerotic calcification aorta. LEFT hip prosthesis. IMPRESSION: Nonobstructive bowel gas pattern. Aortic Atherosclerosis (ICD10-I70.0). Electronically Signed   By: Lavonia Dana M.D.   On: 02/27/2021 11:00        Scheduled Meds:  metoprolol tartrate  5 mg Intravenous Q8H   mometasone-formoterol  2 puff Inhalation BID   nitroGLYCERIN  0.4 mg Sublingual UD   phytonadione  2.5 mg Oral Once   Continuous Infusions:  lactated ringers 50 mL/hr at 02/28/21 7096   promethazine (PHENERGAN) injection (IM or IVPB)      Assessment & Plan:   Principal Problem:   Abdominal pain Active Problems:   Essential (primary) hypertension   Hyperlipidemia, unspecified   Pacemaker   Atrial fibrillation (HCC)   COPD (chronic obstructive pulmonary disease) (HCC)   DVT (deep venous thrombosis) (HCC)   CAD (coronary artery disease)   Chronic diastolic CHF (congestive heart failure) (HCC)   Elevated lactic acid level   CKD (chronic kidney disease), stage IIIa   Nausea vomiting and diarrhea   Abdominal pain, nausea, vomiting and diarrhea in the setting of duodenal mass: CT shows possible duodenal mass at the junction of second and third part causing some degree of obstructive change proximally.   6/9-status post NG tube placement after KUB with gastric distension.  INR needs to be <1.5 for EGD.  Given po K and will give IV K Holding coumadin IVF EGD in am if INR <1.5     Persistent A. fib:  HR variable Will increase ivf to 48ml/hr as bp on lower side Hold warfarin Heparin once INR <2 and ok by GI Metoprolol iv prn     Essential (primary) hypertension: low -normotensive Continue iv beta blk    DVT: On warfarin at home.  Heparin when INR <2 and ok by GI   COPD (chronic obstructive pulmonary disease) ([place]):  Without exacerbation Stable -Bronchodilators   CAD (coronary artery disease): Stable. -As needed nitro   Chronic diastolic CHF  (congestive heart failure) ([place]): TTE in 05/2020 with LVEF> 55% and moderate TVR.  Overall, patient appears euvolemic. -Continue to hold diuretics Lasix and metolazone as she is n.p.o.  Continue gentle hydration  Monitor I's and O's         CKD-3A: Cr slightly higher than baseline.  Close to baseline Continue holding diuretics    Hyperlipidemia, unspecified Hold statin   DVT prophylaxis: coumadin, scd Code Status: DNR Family Communication: Daughter at bedside Disposition Plan:  Status is: Inpatient  Remains inpatient appropriate because:Inpatient level of care appropriate due to severity of illness  Dispo: The patient is from: Home              Anticipated d/c is to: Home              Patient currently is not medically stable to d/c.   Difficult to place patient No            LOS: 2 days   Time spent: 45 minutes with more than 50% on COC  Nolberto Hanlon, MD [facility] Pager [phone]  If 7PM-7AM, please contact night-coverage 02/28/2021, 8:15 AM

## 2021-02-28 NOTE — Consult Note (Addendum)
ANTICOAGULATION CONSULT NOTE   Pharmacy Consult for Heparin Indication:  VTE treatment/hx of DVT/afib  Allergies  Allergen Reactions   Penicillins Anaphylaxis   Amoxicillin    Beta Adrenergic Blockers    Meloxicam     Other reaction(s): Unknown   Naproxen     Other reaction(s): Unknown   Nsaids     Other reaction(s): Unknown    Patient Measurements: Height: 5\' 3"  (160 cm) Weight: 77.1 kg (169 lb 15.6 oz) IBW/kg (Calculated) : 52.4 Heparin Dosing Weight: 69 kg  Vital Signs: Temp: 97.6 F (36.4 C) (06/09 0710) Temp Source: Oral (06/09 0710) BP: 104/75 (06/09 0710) Pulse Rate: 115 (06/09 0710)  Labs: Recent Labs    02/26/21 1305 02/26/21 1651 02/26/21 1923 02/26/21 2206 02/27/21 0429 02/28/21 0404  HGB 13.7  --   --   --  13.6 14.2  HCT 42.5  --   --   --  40.4 43.0  PLT 185  --   --   --  178 169  APTT  --   --  37*  --   --   --   LABPROT 27.5*  --   --   --  26.4* 25.0*  INR 2.6*  --   --   --  2.4* 2.3*  CREATININE 1.33*  --   --   --  1.46* 1.52*  TROPONINIHS 12 11 13 16   --   --      Estimated Creatinine Clearance: 23.7 mL/min (A) (by C-G formula based on SCr of 1.52 mg/dL (H)).   Medical History: Past Medical History:  Diagnosis Date   Anemia    Arrhythmia    atrial fibrillation   Asthma    Breast cancer ([facility]) 1978   left   CAD (coronary artery disease)    CHF (congestive heart failure) (HCC)    COPD (chronic obstructive pulmonary disease) (HCC)    DVT (deep venous thrombosis) (HCC)    Esophageal stricture    Hemorrhoids    History of knee replacement, total    Hyperlipidemia    Hypertension    Ischemic cardiomyopathy    Osteoporosis    Paroxysmal atrial fibrillation (HCC)    Peripheral arterial disease (HCC)    Sick sinus syndrome (HCC)     Medications:  Medications Prior to Admission  Medication Sig Dispense Refill Last Dose   atorvastatin (LIPITOR) 40 MG tablet Take 40 mg by mouth every evening.   02/25/2021 at 2000    budesonide-formoterol (SYMBICORT) 160-4.5 MCG/ACT inhaler Inhale 2 puffs into the lungs 2 (two) times daily. 1 Inhaler 5 02/25/2021 at 2000   DALIRESP 250 MCG TABS Take 250 mcg by mouth daily.   02/26/2021 at 0900   diltiazem (CARDIZEM CD) 180 MG 24 hr capsule Take 180 mg by mouth daily.   02/26/2021 at 0900   escitalopram (LEXAPRO) 5 MG tablet Take 5 mg by mouth daily.   02/26/2021 at 0900   furosemide (LASIX) 40 MG tablet Take 40 mg by mouth daily.   02/26/2021 at 0900   ipratropium-albuterol (DUONEB) 0.5-2.5 (3) MG/3ML SOLN Take 3 mLs by nebulization every 4 (four) hours as needed.   02/25/2021 at prn   metolazone (ZAROXOLYN) 2.5 MG tablet Take 2.5 mg by mouth as directed. Pt takes every Monday.   02/25/2021 at 0900   metoprolol succinate (TOPROL-XL) 50 MG 24 hr tablet Take 50 mg by mouth daily.   02/26/2021 at 0900   nitroGLYCERIN (NITROSTAT) 0.4 MG SL tablet Place 0.4  mg under the tongue as directed.   prn at prn   oxybutynin (DITROPAN) 5 MG tablet Take 5 mg by mouth 3 (three) times daily.   02/26/2021 at 0900   Potassium Chloride ER 20 MEQ TBCR Take 20 tablets by mouth 2 (two) times daily.   02/26/2021 at 0900   quinapril (ACCUPRIL) 40 MG tablet Take 40 mg by mouth daily.   02/26/2021 at 0900   tiotropium (SPIRIVA) 18 MCG inhalation capsule Place 18 mcg into inhaler and inhale daily.   02/25/2021 at Unknown time   warfarin (COUMADIN) 5 MG tablet Take 5 mg by mouth daily.   02/25/2021 at 1800   albuterol (PROVENTIL HFA;VENTOLIN HFA) 108 (90 BASE) MCG/ACT inhaler Inhale 2 puffs into the lungs every 6 (six) hours as needed for wheezing or shortness of breath.   unknown at prn   Scheduled:   metoprolol tartrate  5 mg Intravenous Q8H   mometasone-formoterol  2 puff Inhalation BID   nitroGLYCERIN  0.4 mg Sublingual UD   Infusions:   lactated ringers 50 mL/hr at 02/28/21 6378   promethazine (PHENERGAN) injection (IM or IVPB)     PRN: acetaminophen (TYLENOL) oral liquid 160 mg/5 mL, albuterol, ALPRAZolam, hydrALAZINE,  ipratropium-albuterol, morphine injection, ondansetron (ZOFRAN) IV, promethazine (PHENERGAN) injection (IM or IVPB) Anti-infectives (From admission, onward)    None       Assessment: Pharmacy consulted to start heparin for VTE treatment. Noted patient has a hx of DVTs. Pt is on warfarin for afib.   Goal of Therapy:  Heparin level 0.3-0.7 units/ml Monitor platelets by anticoagulation protocol: Yes   Plan:  Holding heparin, until INR is subtherapeutic - 2.3 today - vit K 2.5mg  po and 1mg  IV given by hospitalist.   Lu Duffel, PharmD, BCPS Clinical Pharmacist 02/28/2021 8:05 AM  Addendum: Per hospitalist, not initiating heparin drip prior to EGD - please reconsult pharmacy if heparin drip needed

## 2021-02-28 NOTE — Progress Notes (Signed)
Pt seen and examined w Mr. [name]. GOO ? Periampullary lesion. NGT for now, egd tomorrow . Need more diagnosis but may need palliative [name] [name] if she is agreeable and medically feasible

## 2021-02-28 NOTE — Progress Notes (Addendum)
[name] Antigua, MD [address] [place], [place], [place], [id number], [place], [place], [place], [address] Phone: [phone]  Fax: [phone]   Subjective:  NG tube was placed via fluoroscopy today and is in place this morning.  It is waiting to be attached to suction, but already has fluid within the tube and had leaked earlier onto the bed.  Objective: Exam: Vital signs in last 24 hours: Vitals:   02/27/21 2357 02/28/21 0508 02/28/21 0710 02/28/21 0900  BP: (!) 151/88 (!) 152/91 104/75 (!) 105/92  Pulse: (!) 110 81 (!) 115 (!) 124  Resp: 18 16 18 14   Temp: 97.9 F (36.6 C)  97.6 F (36.4 C) 97.6 F (36.4 C)  TempSrc: Oral  Oral Oral  SpO2: 100% 95% 94% 99%  Weight:      Height:       Weight change:   Intake/Output Summary (Last 24 hours) at 02/28/2021 1107 Last data filed at 02/27/2021 1800 Gross per 24 hour  Intake 125.74 ml  Output 200 ml  Net -74.26 ml    General: No acute distress, AAO x3 Abd: Soft, NT/ND, No HSM Skin: Warm, no rashes Neck: Supple, Trachea midline Extremities: No edema Eyes: PERRLA, no scleral icterus  Lab Results: Lab Results  Component Value Date   WBC 9.0 02/28/2021   HGB 14.2 02/28/2021   HCT 43.0 02/28/2021   MCV 95.8 02/28/2021   PLT 169 02/28/2021   Micro Results: Recent Results (from the past 240 hour(s))  SARS CORONAVIRUS 2 (TAT 6-24 HRS) Nasopharyngeal Nasopharyngeal Swab     Status: None   Collection Time: 02/26/21  2:32 PM   Specimen: Nasopharyngeal Swab  Result Value Ref Range Status   SARS Coronavirus 2 NEGATIVE NEGATIVE Final    Comment: (NOTE) SARS-CoV-2 target nucleic acids are NOT DETECTED.  The SARS-CoV-2 RNA is generally detectable in upper and lower respiratory specimens during the acute phase of infection. Negative results do not preclude SARS-CoV-2 infection, do not rule out co-infections with other pathogens, and should not be used as the sole basis for treatment or other patient  management decisions. Negative results must be combined with clinical observations, patient history, and epidemiological information. The expected result is Negative.  Fact Sheet for Patients: SugarRoll.be  Fact Sheet for Healthcare Providers: https://www.woods-mathews.com/  This test is not yet approved or cleared by the [place] FDA and  has been authorized for detection and/or diagnosis of SARS-CoV-2 by FDA under an Emergency Use Authorization (EUA). This EUA will remain  in effect (meaning this test can be used) for the duration of the COVID-19 declaration under Se ction 564(b)(1) of the Act, 21 U.S.C. section 360bbb-3(b)(1), unless the authorization is terminated or revoked sooner.  Performed at [facility], [place] [address] [place],  [address]    Studies/Results: CT Abdomen Pelvis Wo Contrast  Result Date: 02/26/2021 CLINICAL DATA:  Acute, non localized abdominal pain. Nausea and diarrhea. EXAM: CT ABDOMEN AND PELVIS WITHOUT CONTRAST TECHNIQUE: Multidetector CT imaging of the abdomen and pelvis was performed following the standard protocol without IV contrast. COMPARISON:  None. FINDINGS: Lower chest: Enlarged heart. Dense atheromatous coronary artery and aortic calcifications. Unremarkable lung bases. Hepatobiliary: Surgically absent gallbladder. Dilated common duct with a maximum diameter of 16.7 mm. No significant intrahepatic ductal dilatation. No visible intraductal stones. Pancreas: Unremarkable. No pancreatic ductal dilatation or surrounding inflammatory changes. Spleen: Normal in size without focal abnormality. Adrenals/Urinary Tract: Adrenal glands are unremarkable. Kidneys are normal, without  renal calculi, focal lesion, or hydronephrosis. Bladder is unremarkable. Stomach/Bowel: Dilated stomach and proximal duodenum to the level of the junction of the 2nd and 3rd portions of the duodenum. There is soft tissue  thickening at that location, including the region of the ampulla. Minimal sigmoid colon diverticulosis.  No evidence of appendicitis. Vascular/Lymphatic: Extensive atheromatous arterial calcifications, including the origins of both renal arteries, superior mesenteric artery and celiac axis. Reproductive: Status post hysterectomy. No adnexal masses. Other: Small umbilical hernia containing fat. Musculoskeletal: Lumbar and lower thoracic spine degenerative changes. Left hip total prosthesis. IMPRESSION: 1. Dilated stomach and proximal duodenum to the level of the junction of the 2nd and 3rd portions of the duodenum, possibly due to a duodenal mass at that location. The possible mass could also be obstructing the distal common duct at the ampulla. 2. Extensive calcific coronary artery and aortic atherosclerosis. Electronically Signed   By: Claudie Revering M.D.   On: 02/26/2021 14:48   DG Chest 2 View  Result Date: 02/26/2021 CLINICAL DATA:  Chest pain EXAM: CHEST - 2 VIEW COMPARISON:  12/18/2020 FINDINGS: Chronic interstitial prominence. No new consolidation or edema. Stable elevation of the right hemidiaphragm. No pleural effusion. No pneumothorax. Stable cardiomediastinal contours. Left chest wall dual lead pacemaker. Diffusely decreased osseous mineralization with poor visualization of vertebral bodies. Right shoulder arthroplasty. Left shoulder osteoarthritis. IMPRESSION: No acute process in the chest. Electronically Signed   By: Macy Mis M.D.   On: 02/26/2021 13:57   CT CHEST ABDOMEN PELVIS W CONTRAST  Result Date: 02/26/2021 CLINICAL DATA:  Nausea and left-sided chest pain. EXAM: CT CHEST, ABDOMEN, AND PELVIS WITH CONTRAST TECHNIQUE: Multidetector CT imaging of the chest, abdomen and pelvis was performed following the standard protocol during bolus administration of intravenous contrast. CONTRAST:  11mL OMNIPAQUE IOHEXOL 300 MG/ML  SOLN COMPARISON:  February 26, 2021 FINDINGS: CT CHEST FINDINGS  Cardiovascular: There is marked severity calcification of the thoracic aorta. There is mild cardiomegaly with marked severity coronary artery calcification. No pericardial effusion. Mediastinum/Nodes: No enlarged mediastinal, hilar, or axillary lymph nodes. Thyroid gland, trachea, and esophagus demonstrate no significant findings. Lungs/Pleura: Very mild atelectasis is seen within the right lower lobe. There is no evidence of acute infiltrate, pleural effusion or pneumothorax. Musculoskeletal: A right shoulder replacement is seen. Multilevel degenerative changes seen throughout the thoracic spine. CT ABDOMEN PELVIS FINDINGS Hepatobiliary: No focal liver abnormality is seen. Status post cholecystectomy. The common bile duct is dilated (1.3 cm). Pancreas: Unremarkable. No pancreatic ductal dilatation or surrounding inflammatory changes. Spleen: Normal in size without focal abnormality. Adrenals/Urinary Tract: Adrenal glands are unremarkable. Kidneys are normal, without renal calculi, focal lesion, or hydronephrosis. The urinary bladder is limited in evaluation secondary to overlying streak artifact. Stomach/Bowel: Stomach is within normal limits. Appendix appears normal. Persistent dilatation of the proximal duodenum is seen to the level of the junction of the second and third portions. Persistent soft tissue thickening is also seen at this level. Noninflamed diverticula are seen within the sigmoid colon. Vascular/Lymphatic: Aortic atherosclerosis. No enlarged abdominal or pelvic lymph nodes. Reproductive: Status post hysterectomy. No adnexal masses. Other: No abdominal wall hernia or abnormality. No abdominopelvic ascites. Musculoskeletal: Multilevel degenerative changes are noted throughout the lumbar spine. IMPRESSION: 1. No acute cardiopulmonary disease. 2. Persistent dilatation of the proximal duodenum which may represent sequelae associated with a duodenal mass. Further evaluation with a GI consult and  subsequent endoscopy is recommended. 3. Sigmoid diverticulosis. Electronically Signed   By: Virgina Norfolk M.D.   On:  02/26/2021 16:35   DG Abd 2 Views  Result Date: 02/28/2021 CLINICAL DATA:  Gastric outlet obstruction, nausea, vomiting EXAM: ABDOMEN - 2 VIEW COMPARISON:  02/27/2021 FINDINGS: Gaseous distention of the stomach. Remainder bowel decompressed. No organomegaly or free air. Aortic calcifications. No visible aneurysm. Cardiomegaly. No confluent opacity in the visualized lungs. IMPRESSION: Gaseous distention of the stomach. Electronically Signed   By: Rolm Baptise M.D.   On: 02/28/2021 09:51   DG Abd Portable 1V  Result Date: 02/27/2021 CLINICAL DATA:  Abdominal distension EXAM: PORTABLE ABDOMEN - 1 VIEW COMPARISON:  Portable exam 0902 hours without priors for comparison FINDINGS: Contrast material within kidneys, renal collecting systems, and bladder. Nonobstructive bowel gas pattern. No bowel dilatation or bowel wall thickening. Osseous demineralization with multilevel degenerative disc and facet disease changes of thoracolumbar spine. Atherosclerotic calcification aorta. LEFT hip prosthesis. IMPRESSION: Nonobstructive bowel gas pattern. Aortic Atherosclerosis (ICD10-I70.0). Electronically Signed   By: Lavonia Dana M.D.   On: 02/27/2021 11:00   DG [place] Dys Tube Plc W/Fl W/Rad  Result Date: 02/28/2021 CLINICAL DATA:  NG tube placement. EXAM: NASO G TUBE PLACEMENT WITH FL AND WITH RAD CONTRAST:  None. FLUOROSCOPY TIME:  Fluoroscopy Time:  0 minutes 48 seconds Radiation Exposure Index (if provided by the fluoroscopic device): 5.4 mGy Number of Acquired Spot Images: 2 COMPARISON:  Abdomen 02/28/2021. FINDINGS: NG tube successfully placed under fluoroscopic guidance. Tube tip and side hole placed are in the stomach. Gastric distention noted. Cardiac pacer noted. IMPRESSION: NG tube successfully placed under fluoroscopic guidance. Gastric distention noted. Electronically Signed   By: Marcello Moores  Register    On: 02/28/2021 08:48   Medications:  Scheduled Meds:  metoprolol tartrate  5 mg Intravenous Q8H   mometasone-formoterol  2 puff Inhalation BID   nitroGLYCERIN  0.4 mg Sublingual UD   Continuous Infusions:  lactated ringers 50 mL/hr at 02/28/21 0981   promethazine (PHENERGAN) injection (IM or IVPB)     PRN Meds:.acetaminophen (TYLENOL) oral liquid 160 mg/5 mL, albuterol, ALPRAZolam, hydrALAZINE, ipratropium-albuterol, morphine injection, ondansetron (ZOFRAN) IV, promethazine (PHENERGAN) injection (IM or IVPB)   Assessment: Principal Problem:   Abdominal pain Active Problems:   Essential (primary) hypertension   Hyperlipidemia, unspecified   Pacemaker   Atrial fibrillation (HCC)   COPD (chronic obstructive pulmonary disease) (HCC)   DVT (deep venous thrombosis) (HCC)   CAD (coronary artery disease)   Chronic diastolic CHF (congestive heart failure) (HCC)   Elevated lactic acid level   CKD (chronic kidney disease), stage IIIa   Nausea vomiting and diarrhea    Plan: Patient now has an NG tube in place, which would allow for suctioning of contents in the stomach given her nausea vomiting, and clearing the stomach prior to upper endoscopy, which would also allow to prevent aspiration during upper endoscopy with sedation  INR is still elevated and will need to be corrected to less than 1.5 prior to upper endoscopy.  Appreciate primary team's assistance in this.  I have been coordinating with Dr. Kurtis Bushman in this regard, and he is ordering vitamin K with rechecks as necessary  Fractionation of bilirubin shows primary elevation is in indirect bilirubin (which would go against biliary obstruction), with very mild elevation in direct bilirubin.  However, her CT scan does show dilated bile duct (question if this is from mass-effect from the duodenal mass, versus postcholecystectomy status).  I did discuss these findings with Dr. Allen Norris and he was able to review the patient's chart and does not  recommend ERCP at  this time.  However, if bilirubin continues to rise, may need to reassess need for ERCP, versus obtaining MRCP  No evidence of cholangitis at this time  We will plan on EGD once INR is less than 1.5  Spent an extensive amount of time discussing plan of care with family as well and answering all her questions in detail.  I have discussed alternative options, risks & benefits,  which include, but are not limited to, bleeding, infection, perforation,respiratory complication & drug reaction.  The patient agrees with this plan & written consent will be obtained.      LOS: 2 days   [name] Antigua, MD 02/28/2021, 11:07 AM

## 2021-02-28 NOTE — Progress Notes (Signed)
[facility]ESS [facility] (cpt [phone])  Hospital Day(s): 2.   Interval History: Patient seen and examined, no acute events or new complaints overnight. Patient reports she is doing okay. She states "im here." She denies fever, chills, nausea, emesis. She remains without leukocytosis. Renal function remains elevated but stable; sCr - 1.52; UO - unmeasured. Hyperphosphatemia to 4.9; o/w no electrolyte derangements. Hyperbilirubinemia to 1.8 which is primarily indirect. INR remains 2.3. She is pending EGD with GI.    Review of Systems:  Constitutional: denies fever, chills  HEENT: denies cough or congestion  Respiratory: denies any shortness of breath  Cardiovascular: denies chest pain or palpitations  Gastrointestinal: denies abdominal pain, N/V, or diarrhea/and bowel function as per interval history Genitourinary: denies burning with urination or urinary frequency  Vital signs in last 24 hours: [min-max] current  Temp:  [97.7 F (36.5 C)-98.6 F (37 C)] 97.9 F (36.6 C) (06/08 2357) Pulse Rate:  [81-112] 81 (06/09 0508) Resp:  [16-18] 16 (06/09 0508) BP: (123-152)/(74-91) 152/91 (06/09 0508) SpO2:  [93 %-100 %] 95 % (06/09 0508)     Height: 5\' 3"  (160 cm) Weight: 77.1 kg BMI (Calculated): 30.12   Intake/Output last 2 shifts:  06/08 0701 - 06/09 0700 In: 125.7 [I.V.:125.7] Out: 200 [Emesis/NG output:200]   Physical Exam:  Constitutional: alert, cooperative and no distress HENT: normocephalic without obvious abnormality Eyes: PERRL, EOM's grossly intact and symmetric Respiratory: breathing non-labored at rest Cardiovascular: regular rate and sinus rhythm Gastrointestinal: soft, non-tender, and non-distended, no rebound/guarding Musculoskeletal: no edema or wounds, motor and sensation grossly intact, NT   Labs:  CBC Latest Ref Rng & Units 02/28/2021 02/27/2021 02/26/2021  WBC 4.0 - 10.5 K/uL 9.0 7.7 6.4  Hemoglobin 12.0 - 15.0 g/dL 14.2 13.6 13.7   Hematocrit 36.0 - 46.0 % 43.0 40.4 42.5  Platelets 150 - 400 K/uL 169 178 185   CMP Latest Ref Rng & Units 02/28/2021 02/27/2021 02/26/2021  Glucose 70 - 99 mg/dL 95 119(H) 107(H)  BUN 8 - 23 mg/dL 38(H) 35(H) 37(H)  Creatinine 0.44 - 1.00 mg/dL 1.52(H) 1.46(H) 1.33(H)  Sodium 135 - 145 mmol/L 143 140 138  Potassium 3.5 - 5.1 mmol/L 4.3 4.5 4.8  Chloride 98 - 111 mmol/L 103 102 105  CO2 22 - 32 mmol/L 29 26 24   Calcium 8.9 - 10.3 mg/dL 9.9 9.8 9.8  Total Protein 6.5 - 8.1 g/dL 7.4 - 7.2  Total Bilirubin 0.3 - 1.2 mg/dL 1.8(H) 1.3(H) 1.4(H)  Alkaline Phos 38 - 126 U/L 75 - 75  AST 15 - 41 U/L 27 - 28  ALT 0 - 44 U/L 15 - 16     Imaging studies:   KUB (02/28/2021) personally reviewed which does show gastric distension, and radiologist report pending.    Assessment/Plan: (ICD-10's: K31.89) 87 y.o. 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    - She remains without nausea/emesis this morning but KUB does have gastric distension. I am hesitant to allow for CLD yet given this finding. If she has issues today with nausea/emesis, then I would recommend placement of NGT.    - Appreciate GI assistance; plan for EGD for formal diagnosis; timing per their service    - Again, this is a rather challenging situation given her age and comorbid conditions. There are potential surgical options (ex;  biliary stent; gastrojejunostomy); however, these are rather extensive and family is not keen on pursuing surgery   -  IVF resuscitation             - Monitor abdominal examination             - Pain control prn; antiemetics prn             - Further management per primary service; we will of course follow along     All of the above findings and recommendations were  discussed with the patient, and the medical team, and all of patient's questions were answered to her expressed satisfaction.   -- Edison Simon, PA-C [facility] 02/28/2021, 7:15 AM [phone] M-F: 7am - 4pm

## 2021-02-28 NOTE — Progress Notes (Signed)
Mobility Specialist - Progress Note    02/28/21 1640  Mobility  Activity Ambulated in hall  Level of Assistance Minimal assist, patient does 75% or more  Assistive Device None  Distance Ambulated (ft) 60 ft  Mobility Ambulated with assistance in hallway  Mobility Response Tolerated well  Mobility performed by Mobility specialist  $Mobility charge 1 Mobility    Pre-mobility: 109 HR, 94% SpO2 During mobility: 139 HR Post-mobility: 119 HR, 96% SPO2  Pt laying supine on bed upon arrival. Pt sat EOB, followed with ambulation in hallway. Pt reported feeling fatigued and SOB during ambulation, O2 difficult to determine due to poor pleth. HR increased to upper 130s. Pt returned to bed and stated RPE was 4/10. Pt reported most difficult part of session was getting OOB. Left in bed with family at bedside.   Kathee Delton Mobility Specialist 02/28/21, 4:50 PM

## 2021-03-01 ENCOUNTER — Encounter
Admission: EM | Disposition: A | Discharge status: Other Acute Inpatient Hospital | Payer: Self-pay | Source: Home / Self Care | Attending: Internal Medicine

## 2021-03-01 ENCOUNTER — Inpatient Hospital Stay: Payer: PPO

## 2021-03-01 ENCOUNTER — Inpatient Hospital Stay: Payer: PPO | Admitting: Anesthesiology

## 2021-03-01 ENCOUNTER — Encounter: Payer: Self-pay | Admitting: Internal Medicine

## 2021-03-01 DIAGNOSIS — J449 Chronic obstructive pulmonary disease, unspecified: Secondary | ICD-10-CM

## 2021-03-01 DIAGNOSIS — R000 Tachycardia, unspecified: Secondary | ICD-10-CM

## 2021-03-01 DIAGNOSIS — I10 Essential (primary) hypertension: Secondary | ICD-10-CM

## 2021-03-01 HISTORY — PX: ESOPHAGOGASTRODUODENOSCOPY: SHX5428

## 2021-03-01 LAB — COMPREHENSIVE METABOLIC PANEL
ALT: 16 U/L (ref 0–44)
AST: 28 U/L (ref 15–41)
Albumin: 3.7 g/dL (ref 3.5–5.0)
Alkaline Phosphatase: 59 U/L (ref 38–126)
Anion gap: 14 (ref 5–15)
BUN: 43 mg/dL — ABNORMAL HIGH (ref 8–23)
CO2: 29 mmol/L (ref 22–32)
Calcium: 9.7 mg/dL (ref 8.9–10.3)
Chloride: 103 mmol/L (ref 98–111)
Creatinine, Ser: 1.51 mg/dL — ABNORMAL HIGH (ref 0.44–1.00)
GFR, Estimated: 32 mL/min — ABNORMAL LOW (ref >60)
Glucose, Bld: 79 mg/dL (ref 70–99)
Potassium: 3.9 mmol/L (ref 3.5–5.1)
Sodium: 146 mmol/L — ABNORMAL HIGH (ref 135–145)
Total Bilirubin: 2.1 mg/dL — ABNORMAL HIGH (ref 0.3–1.2)
Total Protein: 6.9 g/dL (ref 6.5–8.1)

## 2021-03-01 LAB — CBC
HCT: 43.8 % (ref 36.0–46.0)
Hemoglobin: 14.4 g/dL (ref 12.0–15.0)
MCH: 31.7 pg (ref 26.0–34.0)
MCHC: 32.9 g/dL (ref 30.0–36.0)
MCV: 96.5 fL (ref 80.0–100.0)
Platelets: 162 10*3/uL (ref 150–400)
RBC: 4.54 MIL/uL (ref 3.87–5.11)
RDW: 15.1 % (ref 11.5–15.5)
WBC: 8.7 10*3/uL (ref 4.0–10.5)
nRBC: 0 % (ref 0.0–0.2)

## 2021-03-01 LAB — BILIRUBIN, DIRECT: Bilirubin, Direct: 0.4 mg/dL — ABNORMAL HIGH (ref 0.0–0.2)

## 2021-03-01 LAB — GLUCOSE, CAPILLARY: Glucose-Capillary: 77 mg/dL (ref 70–99)

## 2021-03-01 LAB — PROTIME-INR
INR: 1.5 — ABNORMAL HIGH (ref 0.8–1.2)
Prothrombin Time: 17.7 seconds — ABNORMAL HIGH (ref 11.4–15.2)

## 2021-03-01 SURGERY — EGD (ESOPHAGOGASTRODUODENOSCOPY)
Anesthesia: General

## 2021-03-01 MED ORDER — PROPOFOL 10 MG/ML IV BOLUS
INTRAVENOUS | Status: DC | PRN
  Administered 2021-03-01: 80 mg via INTRAVENOUS

## 2021-03-01 MED ORDER — SUCCINYLCHOLINE CHLORIDE 20 MG/ML IJ SOLN
INTRAMUSCULAR | Status: DC | PRN
  Administered 2021-03-01: 160 mg via INTRAVENOUS

## 2021-03-01 MED ORDER — METOPROLOL TARTRATE 5 MG/5ML IV SOLN
2.5000 mg | Freq: Four times a day (QID) | INTRAVENOUS | Status: DC
  Administered 2021-03-01: 2.5 mg via INTRAVENOUS
  Filled 2021-03-01: qty 5

## 2021-03-01 MED ORDER — LACTATED RINGERS IV SOLN: INTRAVENOUS | Status: DC | PRN

## 2021-03-01 MED ORDER — ONDANSETRON HCL 4 MG/2ML IJ SOLN
INTRAMUSCULAR | Status: DC | PRN
  Administered 2021-03-01: 4 mg via INTRAVENOUS

## 2021-03-01 MED ORDER — LIDOCAINE HCL (CARDIAC) PF 100 MG/5ML IV SOSY
PREFILLED_SYRINGE | INTRAVENOUS | Status: DC | PRN
  Administered 2021-03-01: 80 mg via INTRAVENOUS

## 2021-03-01 MED ORDER — DILTIAZEM HCL-DEXTROSE 125-5 MG/125ML-% IV SOLN (PREMIX)
5.0000 mg/h | INTRAVENOUS | Status: DC
  Administered 2021-03-01: 15 mg/h via INTRAVENOUS
  Administered 2021-03-01: 5 mg/h via INTRAVENOUS
  Administered 2021-03-02 (×2): 15 mg/h via INTRAVENOUS
  Filled 2021-03-01 (×5): qty 125

## 2021-03-01 NOTE — Anesthesia Preprocedure Evaluation (Addendum)
Anesthesia Evaluation  Patient identified by MRN, date of birth, ID band Patient awake    Reviewed: Allergy & Precautions, NPO status , Patient's Chart, lab work & pertinent test results  History of Anesthesia Complications (+) history of anesthetic complications  Airway Mallampati: III  TM Distance: <3 FB Neck ROM: limited    Dental  (+) Missing   Pulmonary asthma , COPD, former smoker,    Pulmonary exam normal        Cardiovascular hypertension, + CAD, + Peripheral Vascular Disease and +CHF  + dysrhythmias Atrial Fibrillation + pacemaker  Rhythm:irregular Rate:Tachycardia     Neuro/Psych negative neurological ROS  negative psych ROS   GI/Hepatic negative GI ROS, Neg liver ROS,   Endo/Other  negative endocrine ROS  Renal/GU Renal disease     Musculoskeletal   Abdominal   Peds  Hematology negative hematology ROS (+)   Anesthesia Other Findings SBO w NG tube  Past Medical History: No date: Anemia No date: Arrhythmia     Comment:  atrial fibrillation No date: Asthma 1978: Breast cancer ([place])     Comment:  left No date: CAD (coronary artery disease) No date: CHF (congestive heart failure) (HCC) No date: COPD (chronic obstructive pulmonary disease) (HCC) No date: DVT (deep venous thrombosis) (HCC) No date: Esophageal stricture No date: Hemorrhoids No date: History of knee replacement, total No date: Hyperlipidemia No date: Hypertension No date: Ischemic cardiomyopathy No date: Osteoporosis No date: Paroxysmal atrial fibrillation (HCC) No date: Peripheral arterial disease (HCC) No date: Sick sinus syndrome ([place])  Past Surgical History: 1985: ABDOMINAL HYSTERECTOMY No date: HEMORRHOID SURGERY No date: ILIAC ARTERY STENT No date: INTRAOPERATIVE ARTERIOGRAM; Right 1978: MASTECTOMY; Left 01/11/2020: PACEMAKER INSERTION; Left     Comment:  Procedure: PACEMAKER GENERATOR CHANGE OUT;  Surgeon:                Isaias Cowman, MD;  Location: [facility] ORS;  Service:               Cardiovascular;  Laterality: Left; 1985: REDUCTION MAMMAPLASTY; Right     Comment:  impant placed then removed  BMI    Body Mass Index: 30.11 kg/m      Reproductive/Obstetrics negative OB ROS                            Anesthesia Physical Anesthesia Plan  ASA: 4  Anesthesia Plan: General ETT, Rapid Sequence and Cricoid Pressure   Post-op Pain Management:    Induction: Intravenous  PONV Risk Score and Plan: Ondansetron, Dexamethasone, Midazolam and Treatment may vary due to age or medical condition  Airway Management Planned: Oral ETT  Additional Equipment:   Intra-op Plan:   Post-operative Plan: Extubation in OR  Informed Consent: I have reviewed the patients History and Physical, chart, labs and discussed the procedure including the risks, benefits and alternatives for the proposed anesthesia with the patient or authorized representative who has indicated his/her understanding and acceptance.   Patient has DNR.  Discussed DNR with patient, Discussed DNR with power of attorney and Suspend DNR.   Dental Advisory Given  Plan Discussed with: Anesthesiologist, CRNA and Surgeon  Anesthesia Plan Comments: (Patient and daughter consented for risks of anesthesia including but not limited to:  - adverse reactions to medications - damage to eyes, teeth, lips or other oral mucosa - nerve damage due to positioning  - sore throat or hoarseness - Damage to heart, brain, nerves, lungs, other parts  of body or loss of life  They voiced understanding.)        Anesthesia Quick Evaluation

## 2021-03-01 NOTE — Progress Notes (Signed)
Pt arrived to PACU with a slight cough. Simple mask was removed and NG tube was noted to be coiled in pt's mouth. NG tube removed at this time. Dr. Bonna Gains notified. Waiting for orders.

## 2021-03-01 NOTE — Progress Notes (Signed)
[name] Antigua, MD [address] [place], [place], [place], [address] [address], [place], [place], [place], [address] Phone: [phone]  Fax: [phone]   Subjective: Patient's HR increased to above 130 today and she had to be transferred to a different floor for cardizem drip. Denies any abdominal pain.  NG tube in place.  Output 600 cc in last 24 hours.  INR 1.5 today   Objective: Exam: Vital signs in last 24 hours: Vitals:   03/01/21 0412 03/01/21 0426 03/01/21 0449 03/01/21 0838  BP: 139/88   (!) 122/92  Pulse: 94 (!) 138 (!) 108 (!) 131  Resp: 20   18  Temp: 98.1 F (36.7 C)   98.6 F (37 C)  TempSrc: Oral   Oral  SpO2: 96%   94%  Weight:      Height:       Weight change:   Intake/Output Summary (Last 24 hours) at 03/01/2021 4166 Last data filed at 03/01/2021 0532 Gross per 24 hour  Intake 1240.88 ml  Output 600 ml  Net 640.88 ml    General: No acute distress, AAO x3 Abd: Soft, NT/ND, No HSM Skin: Warm, no rashes Neck: Supple, Trachea midline Eyes: PERRLA, No scleral icterus Pulm: Normal respiratory effort  Lab Results: Lab Results  Component Value Date   WBC 8.7 03/01/2021   HGB 14.4 03/01/2021   HCT 43.8 03/01/2021   MCV 96.5 03/01/2021   PLT 162 03/01/2021   Micro Results: Recent Results (from the past 240 hour(s))  SARS CORONAVIRUS 2 (TAT 6-24 HRS) Nasopharyngeal Nasopharyngeal Swab     Status: None   Collection Time: 02/26/21  2:32 PM   Specimen: Nasopharyngeal Swab  Result Value Ref Range Status   SARS Coronavirus 2 NEGATIVE NEGATIVE Final    Comment: (NOTE) SARS-CoV-2 target nucleic acids are NOT DETECTED.  The SARS-CoV-2 RNA is generally detectable in upper and lower respiratory specimens during the acute phase of infection. Negative results do not preclude SARS-CoV-2 infection, do not rule out co-infections with other pathogens, and should not be used as the sole basis for treatment or other patient management  decisions. Negative results must be combined with clinical observations, patient history, and epidemiological information. The expected result is Negative.  Fact Sheet for Patients: SugarRoll.be  Fact Sheet for Healthcare Providers: https://www.woods-mathews.com/  This test is not yet approved or cleared by the [place] FDA and  has been authorized for detection and/or diagnosis of SARS-CoV-2 by FDA under an Emergency Use Authorization (EUA). This EUA will remain  in effect (meaning this test can be used) for the duration of the COVID-19 declaration under Se ction 564(b)(1) of the Act, 21 U.S.C. section 360bbb-3(b)(1), unless the authorization is terminated or revoked sooner.  Performed at [facility], [place] [address], [place], [place] [address]    Studies/Results: DG Abd 2 Views  Result Date: 02/28/2021 CLINICAL DATA:  Gastric outlet obstruction, nausea, vomiting EXAM: ABDOMEN - 2 VIEW COMPARISON:  02/27/2021 FINDINGS: Gaseous distention of the stomach. Remainder bowel decompressed. No organomegaly or free air. Aortic calcifications. No visible aneurysm. Cardiomegaly. No confluent opacity in the visualized lungs. IMPRESSION: Gaseous distention of the stomach. Electronically Signed   By: Rolm Baptise M.D.   On: 02/28/2021 09:51   DG Abd Portable 2V  Result Date: 03/01/2021 CLINICAL DATA:  Constipation EXAM: PORTABLE ABDOMEN - 2 VIEW COMPARISON:  February 28, 2021. FINDINGS: Supine and upright images obtained. Nasogastric tube tip and side port in stomach. There is  moderate air in stomach. There is moderate stool in colon. There is no bowel dilatation or air-fluid level to suggest bowel obstruction. No evident free air. Status post total hip replacement on the left. Pacemaker leads attached to right atrium and right ventricle. There is aortic atherosclerosis. There is mild bibasilar lung atelectasis. IMPRESSION: Nasogastric tube tip and side  port in stomach. No bowel obstruction or free air evident. Moderate air in stomach. Bibasilar atelectasis.  Aortic Atherosclerosis (ICD10-I70.0). Electronically Signed   By: Lowella Grip III M.D.   On: 03/01/2021 09:05   DG Loyce Dys Tube Plc W/Fl W/Rad  Result Date: 02/28/2021 CLINICAL DATA:  NG tube placement. EXAM: NASO G TUBE PLACEMENT WITH FL AND WITH RAD CONTRAST:  None. FLUOROSCOPY TIME:  Fluoroscopy Time:  0 minutes 48 seconds Radiation Exposure Index (if provided by the fluoroscopic device): 5.4 mGy Number of Acquired Spot Images: 2 COMPARISON:  Abdomen 02/28/2021. FINDINGS: NG tube successfully placed under fluoroscopic guidance. Tube tip and side hole placed are in the stomach. Gastric distention noted. Cardiac pacer noted. IMPRESSION: NG tube successfully placed under fluoroscopic guidance. Gastric distention noted. Electronically Signed   By: Marcello Moores  Register   On: 02/28/2021 08:48   Medications:  Scheduled Meds:  metoprolol tartrate  2.5 mg Intravenous Q6H   mometasone-formoterol  2 puff Inhalation BID   nitroGLYCERIN  0.4 mg Sublingual UD   Continuous Infusions:  diltiazem (CARDIZEM) infusion     lactated ringers 75 mL/hr at 03/01/21 3474   promethazine (PHENERGAN) injection (IM or IVPB)     PRN Meds:.acetaminophen (TYLENOL) oral liquid 160 mg/5 mL, albuterol, ALPRAZolam, ipratropium-albuterol, morphine injection, ondansetron (ZOFRAN) IV, promethazine (PHENERGAN) injection (IM or IVPB)   Assessment: Principal Problem:   Abdominal pain Active Problems:   Essential (primary) hypertension   Hyperlipidemia, unspecified   Pacemaker   Atrial fibrillation (HCC)   COPD (chronic obstructive pulmonary disease) (HCC)   DVT (deep venous thrombosis) (HCC)   CAD (coronary artery disease)   Chronic diastolic CHF (congestive heart failure) (HCC)   Elevated lactic acid level   CKD (chronic kidney disease), stage IIIa   Nausea vomiting and diarrhea    Plan: Bilirubin evaluated  again this morning and total bilirubin did increase to 2.1 compared to 1.8 yesterday.   I have obtained further fractionation, and direct bilirubin is still as the same level as yesterday, which is 0.4.  Per this lab value, the calculated indirect bilirubin would be 1.7.  Therefore, given primary elevation is an indirect bilirubin, and alkaline phosphatase remains normal, elevation in bilirubin is unlikely from biliary obstruction  In addition, INR has decreased to 1.5 this morning  However, patient's HR is now elevated and she is on a cardizem drip. Discussed with anesthesia and once HR improves can proceed with EGD  Given the above, possible EGD today vs tomorrow depending on clinical status and improvement in elevated heart rate  I have discussed alternative options, risks & benefits,  which include, but are not limited to, bleeding, infection, perforation,respiratory complication & drug reaction.  The patient agrees with this plan & written consent will be obtained.      LOS: 3 days   [name] Antigua, MD 03/01/2021, 9:58 AM

## 2021-03-01 NOTE — Anesthesia Postprocedure Evaluation (Signed)
Anesthesia Post Note  Patient: [name] [name]  Procedure(s) Performed: ESOPHAGOGASTRODUODENOSCOPY (EGD)  Patient location during evaluation: Endoscopy Anesthesia Type: General Level of consciousness: awake and alert Pain management: pain level controlled Vital Signs Assessment: post-procedure vital signs reviewed and stable Respiratory status: spontaneous breathing, nonlabored ventilation, respiratory function stable and patient connected to nasal cannula oxygen Cardiovascular status: blood pressure returned to baseline and stable Postop Assessment: no apparent nausea or vomiting Anesthetic complications: no   No notable events documented.   Last Vitals:  Vitals:   03/01/21 1645 03/01/21 1700  BP: 137/87 (!) 134/91  Pulse: (!) 104 88  Resp: (!) 21 (!) 22  Temp:  37.2 C  SpO2: 91% 97%    Last Pain:  Vitals:   03/01/21 1700  TempSrc:   PainSc: 0-No pain                 Precious Haws Jeliyah Middlebrooks

## 2021-03-01 NOTE — Op Note (Addendum)
[facility] Gastroenterology Patient Name: [name] [name] Procedure Date: 03/01/2021 3:44 PM MRN: [id number] Account #: [id number] Date of Birth: 03/21/1927 Admit Type: Inpatient Age: 87 Room: [facility] ENDO ROOM 4 Gender: Female Note Status: Finalized Procedure:             Upper GI endoscopy Indications:           Abnormal CT of the GI tract, Nausea with vomiting Providers:             Orah Sonnen B. Bonna Gains MD, MD Referring MD:          Forest Gleason Md, MD (Referring MD) Medicines:             Monitored Anesthesia Care Complications:         No immediate complications. Procedure:             Pre-Anesthesia Assessment:                        - The risks and benefits of the procedure and the                         sedation options and risks were discussed with the                         patient. All questions were answered and informed                         consent was obtained.                        - Patient identification and proposed procedure were                         verified prior to the procedure.                        - ASA Grade Assessment: III - A patient with severe                         systemic disease.                        After obtaining informed consent, the endoscope was                         passed under direct vision. Throughout the procedure,                         the patient's blood pressure, pulse, and oxygen                         saturations were monitored continuously. The Endoscope                         was introduced through the mouth, and advanced to the                         third part of duodenum. The upper GI endoscopy was  accomplished with ease. The patient tolerated the                         procedure well. Findings:      The examined esophagus was normal.      A varix with no bleeding was found in the gastric antrum. It had no       stigmata of recent bleeding.      Patchy mild mucosal  changes characterized by thickened folds were found       in the gastric antrum. Biopsies were taken with a cold forceps for       histology. Biopsies were carefully done away from the gastrix varix.      The exam of the stomach was otherwise normal.      The duodenal bulb was normal.      The area of the papilla was normal.      An extrinsic deformity was found in the second portion of the duodenum.       Biopsies were taken with a cold forceps for histology. This area       appeared to have thickened folds that were not able to be insufflated       with air like usual. There was no resistance to passing the endoscope       through this area and the lumen distal to this area was normal and       insufflated well. There was no obvious mass in this area. The biopsies       were done well away from the papilla as marked in the images.      The exam of the duodenum was otherwise normal.      Please note that NG tube was left in place and this was documented with       a picture. However, when patient was in PACU the NG was found coiled in       the patient's mouth and the nurses in PACU removed it (I was not       informed before it was removed). Impression:            - Normal esophagus.                        - Gastric varices, without bleeding.                        - Thickened folds mucosa in the antrum. Biopsied.                        - Normal duodenal bulb.                        - Normal area of the papilla.                        - Duodenal deformity. Biopsied. Recommendation:        - Await pathology results.                        - Ideally an MRI/MRCP could be done next to evaluate                         for any biliary duct abnormalities or mass causing the  extrinsic compression. However, patient has a                         pacemaker and thus this is not possible at [facility], but                         the equipment to be able to do this would be  available                         at [facility] or another tertiary center. EUS would be                         another modality for evaluation of this area and this                         is not available at [facility] as well.                        - If pt improves clinically with improvement in                         nausea/vomiting, above may be able to be set up as an                         outpatient. However, if symptoms continue patient may                         need to be transferred as an inpatient.                        - The findings and recommendations were discussed with                         the patient.                        - The findings and recommendations were discussed with                         the patient's family.                        - Return patient to hospital ward for ongoing care.                        - The findings and recommendations were discussed with                         the patient's primary physician.                        - Follow up in GI clinic for workup of isolated                         gastric varix Procedure Code(s):     --- Professional ---                        [phone],  Esophagogastroduodenoscopy, flexible,                         transoral; with biopsy, single or multiple Diagnosis Code(s):     --- Professional ---                        I86.4, Gastric varices                        K31.89, Other diseases of stomach and duodenum                        R11.2, Nausea with vomiting, unspecified                        R93.3, Abnormal findings on diagnostic imaging of                         other parts of digestive tract CPT copyright 2019 American Medical Association. All rights reserved. The codes documented in this report are preliminary and upon coder review may  be revised to meet current compliance requirements.  Vonda Antigua, MD Margretta Sidle B. Bonna Gains MD, MD 03/01/2021 4:50:48 PM This report has been signed  electronically. Number of Addenda: 0 Note Initiated On: 03/01/2021 3:44 PM Estimated Blood Loss:  Estimated blood loss: none.      [facility]

## 2021-03-01 NOTE — Progress Notes (Signed)
EGD results were discussed in detail with surgery team and hospitalist and based on this discussion best course of action has determined to be transfer to [facility] or tertiary center. This is because etiology of extrinsic compression is not clear on CT. MRI/MRCP may help evaluate this further vs EUS. Patients pacemaker does not allow for MRI at [facility] but this is available at [facility]. I discussed this pt with patient and daughter as well and they are agreeable with transfer. Option of workup outpatient discussed as well if N/V improves but surgery team recommends inpatient workup which is reasonable. Dr. Kurtis Bushman would be working on transferring the patient tomorrow morning.   Dr. Allen Norris will be following the patient over the weekend and is aware about the above discussion.

## 2021-03-01 NOTE — Transfer of Care (Signed)
Immediate Anesthesia Transfer of Care Note  Patient: [name] [name]  Procedure(s) Performed: ESOPHAGOGASTRODUODENOSCOPY (EGD)  Patient Location: PACU  Anesthesia Type:General  Level of Consciousness: awake  Airway & Oxygen Therapy: Patient Spontanous Breathing and Patient connected to face mask oxygen  Post-op Assessment: Report given to RN  Post vital signs: stable  Last Vitals:  Vitals Value Taken Time  BP 137/77 03/01/21 1634  Temp    Pulse 98 03/01/21 1639  Resp 19 03/01/21 1639  SpO2 94 % 03/01/21 1639  Vitals shown include unvalidated device data.  Last Pain:  Vitals:   03/01/21 1531  TempSrc: Temporal  PainSc: 0-No pain         Complications: No notable events documented.

## 2021-03-01 NOTE — Progress Notes (Signed)
[facility] SURGICAL PROGRESS NOTE (cpt [phone])  Hospital Day(s): 3.   Interval History: Patient seen and examined, no acute events or new complaints overnight. Patient reports she is doing well this morning. Patient's daughter reports she has felt better since NGT placement. She denies fever, chills, nausea, emesis. She remains without leukocytosis; Renal function remains at her baseline; UO - unmeasured. INR down to 1.5 this morning. She did have NGT placed via fluoroscopy yesterday. Output 600 ccs in last 24 hours.   Review of Systems:  Constitutional: denies fever, chills  HEENT: denies cough or congestion  Respiratory: denies any shortness of breath  Cardiovascular: denies chest pain or palpitations  Gastrointestinal: denies abdominal pain, N/V, or diarrhea/and bowel function as per interval history Genitourinary: denies burning with urination or urinary frequency   Vital signs in last 24 hours: [min-max] current  Temp:  [97.6 F (36.4 C)-98.1 F (36.7 C)] 98.1 F (36.7 C) (06/10 0412) Pulse Rate:  [62-138] 108 (06/10 0449) Resp:  [14-20] 20 (06/10 0412) BP: (100-139)/(75-92) 139/88 (06/10 0412) SpO2:  [93 %-99 %] 96 % (06/10 0412)     Height: 5\' 3"  (160 cm) Weight: 77.1 kg BMI (Calculated): 30.12   Intake/Output last 2 shifts:  06/09 0701 - 06/10 0700 In: 1240.9 [I.V.:1187.2; IV Piggyback:53.7] Out: 600 [Emesis/NG output:600]   Physical Exam:  Constitutional: alert, cooperative and no distress HENT: normocephalic without obvious abnormality. NGT in place; output bilious Eyes: PERRL, EOM's grossly intact and symmetric Respiratory: breathing non-labored at rest Cardiovascular: regular rate and sinus rhythm Gastrointestinal: soft, non-tender, and non-distended, no rebound/guarding Musculoskeletal: no edema or wounds, motor and sensation grossly intact, NT     Labs:  CBC Latest Ref Rng & Units 03/01/2021 02/28/2021 02/27/2021  WBC 4.0 - 10.5 K/uL 8.7 9.0 7.7   Hemoglobin 12.0 - 15.0 g/dL 14.4 14.2 13.6  Hematocrit 36.0 - 46.0 % 43.8 43.0 40.4  Platelets 150 - 400 K/uL 162 169 178   CMP Latest Ref Rng & Units 03/01/2021 02/28/2021 02/28/2021  Glucose 70 - 99 mg/dL 79 - 95  BUN 8 - 23 mg/dL 43(H) - 38(H)  Creatinine 0.44 - 1.00 mg/dL 1.51(H) - 1.52(H)  Sodium 135 - 145 mmol/L 146(H) - 143  Potassium 3.5 - 5.1 mmol/L 3.9 - 4.3  Chloride 98 - 111 mmol/L 103 - 103  CO2 22 - 32 mmol/L 29 - 29  Calcium 8.9 - 10.3 mg/dL 9.7 - 9.9  Total Protein 6.5 - 8.1 g/dL 6.9 - 7.4  Total Bilirubin 0.3 - 1.2 mg/dL 2.1(H) 1.8(H) 1.8(H)  Alkaline Phos 38 - 126 U/L 59 - 75  AST 15 - 41 U/L 28 - 27  ALT 0 - 44 U/L 16 - 15     Imaging studies:   KUB (03/01/2021) personally reviewed with persistent gastric distension, and radiologist report pending   Assessment/Plan: (ICD-10's: K31.89) 87 y.o. 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    - Appreciate GI assistance; plan for EGD for formal diagnosis; timing per their service               - Again, this is a rather challenging situation given her age and comorbid conditions. There are potential surgical options (ex; biliary stent; gastrojejunostomy); however, these are rather extensive and family is not keen on pursuing surgery. Will determine timing following EGD results.   -  Continue NGT to LIS: monitor and record output             - NPO + IVF resuscitation             - Monitor abdominal examination             - Pain control prn; antiemetics prn             - Further management per primary service; we will of course follow along    All of the above findings and recommendations were discussed with the patient, patient's family (daughter at bedside), and the medical team, and all of patient's and family's questions were  answered to their expressed satisfaction.  -- Edison Simon, PA-C [facility] 03/01/2021, 7:05 AM [phone] M-F: 7am - 4pm

## 2021-03-01 NOTE — Anesthesia Procedure Notes (Signed)
Procedure Name: Intubation Date/Time: 03/01/2021 3:50 PM Performed by: Lerry Liner, CRNA Pre-anesthesia Checklist: Patient identified, Emergency Drugs available, Suction available and Patient being monitored Patient Re-evaluated:Patient Re-evaluated prior to induction Oxygen Delivery Method: Circle system utilized Preoxygenation: Pre-oxygenation with 100% oxygen Induction Type: IV induction Laryngoscope Size: McGraph and 3 Grade View: Grade I Tube type: Oral Tube size: 6.5 mm Number of attempts: 1 Airway Equipment and Method: Stylet and Oral airway Placement Confirmation: ETT inserted through vocal cords under direct vision, positive ETCO2 and breath sounds checked- equal and bilateral Secured at: 21 cm Tube secured with: Tape Dental Injury: Teeth and Oropharynx as per pre-operative assessment  Comments: RSI no attempt to ventilate.  NGT decompressed prior to induction.

## 2021-03-01 NOTE — Progress Notes (Signed)
PROGRESS NOTE    [name] [name]  PRF:[id number] DOB: 1927/05/21 DOA: 02/26/2021 PCP: Tracie Harrier, MD    Brief Narrative:  87 year old F with PMH of COPD, CKD-3A, SSS s/p PPM, DVT on warfarin, CAD, diastolic CHF, HTN, HLD, GERD, PVD and remote left breast cancer presenting with abdominal pain, intermittent nausea, vomiting and diarrhea for about 2 weeks.  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  GI and general surgery consulted.  NGT insertion was unsuccessful.  Patient was kept n.p.o. overnight.   Patient was evaluated by GI and general surgery.  Abdominal film with nonobstructive bowel gas pattern.  GI planning EGD once INR less than 1.5.  6/9- As I walked in to see pt, daughter at bedside, very angry and rude with me. Angry about multiple things about yesterday. Wanted to know why pt was not told about NGT, and called, and multiple other complaints. Explained to the daughter, I cannot answer for yesterdays events pertaining to things I do not know about as I am taking over her moms care today. D/w daughter about why NGT was placed today by surgery , discussed why cant do EGD today due to her INR. Daughter again is angry asking the same questions over despite me explaining things. She is upset with yesterdays MD about not getting called and not being told about NGT. Explained to daughter gsx made the decision today as pt still symptomatic and distended. Unfortunetly nothing I said could make daughter calm down and be less rude and aggressive with me.  6/10- daughter at bedside. Pt more interactive today. Denies abd pain, sob, cp, nv. NGT in place.INR 1.5. later pt went into afib rvr.   Consultants:  GI, surgery  Procedures:   Antimicrobials:      Subjective: Still with ngt.denies palpitations  Objective: Vitals:   03/01/21 0000 03/01/21 0412  03/01/21 0426 03/01/21 0449  BP: 128/86 139/88    Pulse: (!) 105 94 (!) 138 (!) 108  Resp:  20    Temp: 98.1 F (36.7 C) 98.1 F (36.7 C)    TempSrc: Oral Oral    SpO2: 98% 96%    Weight:      Height:        Intake/Output Summary (Last 24 hours) at 03/01/2021 0805 Last data filed at 03/01/2021 0532 Gross per 24 hour  Intake 1240.88 ml  Output 600 ml  Net 640.88 ml   Filed Weights   02/26/21 1301  Weight: 77.1 kg    Examination: Lying flat in bed, NAD, NG tube in place Anteriorly clear to auscultation no wheezing Irregular, mildly tacky, S1-S2 no gallops Soft benign positive bowel sounds No edema Awake and alert Mood and affect appropriate in current setting    Data Reviewed: I have personally reviewed following labs and imaging studies  CBC: Recent Labs  Lab 02/26/21 1305 02/27/21 0429 02/28/21 0404 03/01/21 0500  WBC 6.4 7.7 9.0 8.7  HGB 13.7 13.6 14.2 14.4  HCT 42.5 40.4 43.0 43.8  MCV 95.7 93.1 95.8 96.5  PLT 185 178 169 935   Basic Metabolic Panel: Recent Labs  Lab 02/26/21 1305 02/27/21 0429 02/28/21 0404 03/01/21 0500  NA 138 140 143 146*  K 4.8 4.5 4.3 3.9  CL 105 102 103 103  CO2 24 26 29 29   GLUCOSE 107* 119* 95 79  BUN 37* 35* 38* 43*  CREATININE  1.33* 1.46* 1.52* 1.51*  CALCIUM 9.8 9.8 9.9 9.7  MG  --  2.2 2.2  --   PHOS  --   --  4.9*  --    GFR: Estimated Creatinine Clearance: 23.9 mL/min (A) (by C-G formula based on SCr of 1.51 mg/dL (H)). Liver Function Tests: Recent Labs  Lab 02/26/21 1305 02/27/21 0429 02/28/21 0404 03/01/21 0500  AST 28  --  27 28  ALT 16  --  15 16  ALKPHOS 75  --  75 59  BILITOT 1.4* 1.3* 1.8*  1.8* 2.1*  PROT 7.2  --  7.4 6.9  ALBUMIN 4.0  --  4.2 3.7   Recent Labs  Lab 02/26/21 1305  LIPASE 44   No results for input(s): AMMONIA in the last 168 hours. Coagulation Profile: Recent Labs  Lab 02/26/21 1305 02/27/21 0429 02/28/21 0404 03/01/21 0500  INR 2.6* 2.4* 2.3* 1.5*   Cardiac  Enzymes: No results for input(s): CKTOTAL, CKMB, CKMBINDEX, TROPONINI in the last 168 hours. BNP (last 3 results) No results for input(s): PROBNP in the last 8760 hours. HbA1C: No results for input(s): HGBA1C in the last 72 hours. CBG: Recent Labs  Lab 02/27/21 0755 02/28/21 0725 03/01/21 0743  GLUCAP 120* 93 77   Lipid Profile: No results for input(s): CHOL, HDL, LDLCALC, TRIG, CHOLHDL, LDLDIRECT in the last 72 hours. Thyroid Function Tests: No results for input(s): TSH, T4TOTAL, FREET4, T3FREE, THYROIDAB in the last 72 hours. Anemia Panel: No results for input(s): VITAMINB12, FOLATE, FERRITIN, TIBC, IRON, RETICCTPCT in the last 72 hours. Sepsis Labs: No results for input(s): PROCALCITON, LATICACIDVEN in the last 168 hours.  Recent Results (from the past 240 hour(s))  SARS CORONAVIRUS 2 (TAT 6-24 HRS) Nasopharyngeal Nasopharyngeal Swab     Status: None   Collection Time: 02/26/21  2:32 PM   Specimen: Nasopharyngeal Swab  Result Value Ref Range Status   SARS Coronavirus 2 NEGATIVE NEGATIVE Final    Comment: (NOTE) SARS-CoV-2 target nucleic acids are NOT DETECTED.  The SARS-CoV-2 RNA is generally detectable in upper and lower respiratory specimens during the acute phase of infection. Negative results do not preclude SARS-CoV-2 infection, do not rule out co-infections with other pathogens, and should not be used as the sole basis for treatment or other patient management decisions. Negative results must be combined with clinical observations, patient history, and epidemiological information. The expected result is Negative.  Fact Sheet for Patients: SugarRoll.be  Fact Sheet for Healthcare Providers: https://www.woods-mathews.com/  This test is not yet approved or cleared by the [place] FDA and  has been authorized for detection and/or diagnosis of SARS-CoV-2 by FDA under an Emergency Use Authorization (EUA). This EUA will  remain  in effect (meaning this test can be used) for the duration of the COVID-19 declaration under Se ction 564(b)(1) of the Act, 21 U.S.C. section 360bbb-3(b)(1), unless the authorization is terminated or revoked sooner.  Performed at [facility], [place] [address], [place], [place] [address]          Radiology Studies: DG Abd 2 Views  Result Date: 02/28/2021 CLINICAL DATA:  Gastric outlet obstruction, nausea, vomiting EXAM: ABDOMEN - 2 VIEW COMPARISON:  02/27/2021 FINDINGS: Gaseous distention of the stomach. Remainder bowel decompressed. No organomegaly or free air. Aortic calcifications. No visible aneurysm. Cardiomegaly. No confluent opacity in the visualized lungs. IMPRESSION: Gaseous distention of the stomach. Electronically Signed   By: Rolm Baptise M.D.   On: 02/28/2021 09:51   DG Abd Portable  1V  Result Date: 02/27/2021 CLINICAL DATA:  Abdominal distension EXAM: PORTABLE ABDOMEN - 1 VIEW COMPARISON:  Portable exam 0902 hours without priors for comparison FINDINGS: Contrast material within kidneys, renal collecting systems, and bladder. Nonobstructive bowel gas pattern. No bowel dilatation or bowel wall thickening. Osseous demineralization with multilevel degenerative disc and facet disease changes of thoracolumbar spine. Atherosclerotic calcification aorta. LEFT hip prosthesis. IMPRESSION: Nonobstructive bowel gas pattern. Aortic Atherosclerosis (ICD10-I70.0). Electronically Signed   By: Lavonia Dana M.D.   On: 02/27/2021 11:00   DG Loyce Dys Tube Plc W/Fl W/Rad  Result Date: 02/28/2021 CLINICAL DATA:  NG tube placement. EXAM: NASO G TUBE PLACEMENT WITH FL AND WITH RAD CONTRAST:  None. FLUOROSCOPY TIME:  Fluoroscopy Time:  0 minutes 48 seconds Radiation Exposure Index (if provided by the fluoroscopic device): 5.4 mGy Number of Acquired Spot Images: 2 COMPARISON:  Abdomen 02/28/2021. FINDINGS: NG tube successfully placed under fluoroscopic guidance. Tube tip and side hole placed are  in the stomach. Gastric distention noted. Cardiac pacer noted. IMPRESSION: NG tube successfully placed under fluoroscopic guidance. Gastric distention noted. Electronically Signed   By: Marcello Moores  Register   On: 02/28/2021 08:48        Scheduled Meds:  metoprolol tartrate  5 mg Intravenous Q8H   mometasone-formoterol  2 puff Inhalation BID   nitroGLYCERIN  0.4 mg Sublingual UD   Continuous Infusions:  lactated ringers 75 mL/hr at 02/28/21 2248   promethazine (PHENERGAN) injection (IM or IVPB)      Assessment & Plan:   Principal Problem:   Abdominal pain Active Problems:   Essential (primary) hypertension   Hyperlipidemia, unspecified   Pacemaker   Atrial fibrillation (HCC)   COPD (chronic obstructive pulmonary disease) (HCC)   DVT (deep venous thrombosis) (HCC)   CAD (coronary artery disease)   Chronic diastolic CHF (congestive heart failure) (HCC)   Elevated lactic acid level   CKD (chronic kidney disease), stage IIIa   Nausea vomiting and diarrhea   Abdominal pain, nausea, vomiting and diarrhea in the setting of duodenal mass: CT shows possible duodenal mass at the junction of second and third part causing some degree of obstructive change proximally.   6/9-status post NG tube placement after KUB with gastric distension.  I 6/10 INR 1.5, plan to do EGD today if heart rate better controlled  Was given p.o. and IV vitamin K yesterday  Continue to hold Coumadin for possible EGD  Decrease IV fluids to 50 mL/hour since on Cardizem drip       Persistent A. fib:  Currently heart rate elevated Started her on Cardizem drip increase as tolerated Coumadin on hold for above Cardiology consulted Hold iv metoprolol since on starting cardizem gtt      Essential (primary) hypertension: low -normotensive Dc beta blk iv, on cardizem gtt    DVT:  On warfarin at home.on hold currently as above Will ask GI if ok and when to stop heparin gtt if we start for egd    COPD  (chronic obstructive pulmonary disease) ([place]):  Without exacerbation Stable -Bronchodilators   CAD (coronary artery disease): Stable. -As needed nitro   Chronic diastolic CHF (congestive heart failure) ([place]): TTE in 05/2020 with LVEF> 55% and moderate TVR.  Overall, patient appears euvolemic. -Continue to hold diuretics Lasix and metolazone as she is n.p.o.  Continue gentle hydration  Monitor I's and O's         CKD-3A: Cr slightly higher than baseline.  Close to baseline Continue holding diuretics  Hyperlipidemia, unspecified Hold statin   DVT prophylaxis: coumadin, scd Code Status: DNR Family Communication: Daughter at bedside Disposition Plan:  Status is: Inpatient  Remains inpatient appropriate because:Inpatient level of care appropriate due to severity of illness  Dispo: The patient is from: Home              Anticipated d/c is to: Home              Patient currently is not medically stable to d/c.   Difficult to place patient No            LOS: 3 days   Time spent: 35 minutes with more than 50% on [place], MD [facility] Pager [phone]  If 7PM-7AM, please contact night-coverage 03/01/2021, 8:05 AM

## 2021-03-02 ENCOUNTER — Inpatient Hospital Stay (HOSPITAL_COMMUNITY)
Admission: AD | Admit: 2021-03-02 | Discharge: 2021-03-09 | DRG: 380 | Disposition: A | Discharge status: Hospice | Payer: PPO | Source: Other Acute Inpatient Hospital | Attending: Family Medicine | Admitting: Family Medicine

## 2021-03-02 DIAGNOSIS — N183 Chronic kidney disease, stage 3 unspecified: Secondary | ICD-10-CM | POA: Diagnosis present

## 2021-03-02 DIAGNOSIS — Z87891 Personal history of nicotine dependence: Secondary | ICD-10-CM

## 2021-03-02 DIAGNOSIS — K259 Gastric ulcer, unspecified as acute or chronic, without hemorrhage or perforation: Secondary | ICD-10-CM | POA: Diagnosis not present

## 2021-03-02 DIAGNOSIS — K3189 Other diseases of stomach and duodenum: Secondary | ICD-10-CM | POA: Diagnosis not present

## 2021-03-02 DIAGNOSIS — N1831 Chronic kidney disease, stage 3a: Secondary | ICD-10-CM | POA: Diagnosis not present

## 2021-03-02 DIAGNOSIS — K315 Obstruction of duodenum: Secondary | ICD-10-CM | POA: Diagnosis present

## 2021-03-02 DIAGNOSIS — Z7901 Long term (current) use of anticoagulants: Secondary | ICD-10-CM

## 2021-03-02 DIAGNOSIS — Z7401 Bed confinement status: Secondary | ICD-10-CM | POA: Diagnosis not present

## 2021-03-02 DIAGNOSIS — I4891 Unspecified atrial fibrillation: Secondary | ICD-10-CM | POA: Diagnosis present

## 2021-03-02 DIAGNOSIS — K269 Duodenal ulcer, unspecified as acute or chronic, without hemorrhage or perforation: Secondary | ICD-10-CM | POA: Diagnosis not present

## 2021-03-02 DIAGNOSIS — K869 Disease of pancreas, unspecified: Secondary | ICD-10-CM | POA: Diagnosis not present

## 2021-03-02 DIAGNOSIS — I2510 Atherosclerotic heart disease of native coronary artery without angina pectoris: Secondary | ICD-10-CM | POA: Diagnosis present

## 2021-03-02 DIAGNOSIS — Z86718 Personal history of other venous thrombosis and embolism: Secondary | ICD-10-CM

## 2021-03-02 DIAGNOSIS — Z7189 Other specified counseling: Secondary | ICD-10-CM

## 2021-03-02 DIAGNOSIS — Z515 Encounter for palliative care: Secondary | ICD-10-CM | POA: Diagnosis not present

## 2021-03-02 DIAGNOSIS — Z95828 Presence of other vascular implants and grafts: Secondary | ICD-10-CM

## 2021-03-02 DIAGNOSIS — I10 Essential (primary) hypertension: Secondary | ICD-10-CM | POA: Diagnosis present

## 2021-03-02 DIAGNOSIS — I700 Atherosclerosis of aorta: Secondary | ICD-10-CM | POA: Diagnosis not present

## 2021-03-02 DIAGNOSIS — Z20822 Contact with and (suspected) exposure to covid-19: Secondary | ICD-10-CM | POA: Diagnosis present

## 2021-03-02 DIAGNOSIS — I480 Paroxysmal atrial fibrillation: Secondary | ICD-10-CM | POA: Diagnosis not present

## 2021-03-02 DIAGNOSIS — K5710 Diverticulosis of small intestine without perforation or abscess without bleeding: Secondary | ICD-10-CM | POA: Diagnosis not present

## 2021-03-02 DIAGNOSIS — E785 Hyperlipidemia, unspecified: Secondary | ICD-10-CM | POA: Diagnosis present

## 2021-03-02 DIAGNOSIS — Z79899 Other long term (current) drug therapy: Secondary | ICD-10-CM

## 2021-03-02 DIAGNOSIS — Z853 Personal history of malignant neoplasm of breast: Secondary | ICD-10-CM

## 2021-03-02 DIAGNOSIS — Z803 Family history of malignant neoplasm of breast: Secondary | ICD-10-CM

## 2021-03-02 DIAGNOSIS — I5032 Chronic diastolic (congestive) heart failure: Secondary | ICD-10-CM | POA: Diagnosis present

## 2021-03-02 DIAGNOSIS — M2550 Pain in unspecified joint: Secondary | ICD-10-CM | POA: Diagnosis not present

## 2021-03-02 DIAGNOSIS — K5712 Diverticulitis of small intestine without perforation or abscess without bleeding: Secondary | ICD-10-CM | POA: Diagnosis present

## 2021-03-02 DIAGNOSIS — K831 Obstruction of bile duct: Secondary | ICD-10-CM | POA: Diagnosis present

## 2021-03-02 DIAGNOSIS — D490 Neoplasm of unspecified behavior of digestive system: Secondary | ICD-10-CM | POA: Diagnosis present

## 2021-03-02 DIAGNOSIS — Z9981 Dependence on supplemental oxygen: Secondary | ICD-10-CM

## 2021-03-02 DIAGNOSIS — J9611 Chronic respiratory failure with hypoxia: Secondary | ICD-10-CM | POA: Diagnosis not present

## 2021-03-02 DIAGNOSIS — Z880 Allergy status to penicillin: Secondary | ICD-10-CM

## 2021-03-02 DIAGNOSIS — R933 Abnormal findings on diagnostic imaging of other parts of digestive tract: Secondary | ICD-10-CM | POA: Diagnosis not present

## 2021-03-02 DIAGNOSIS — Z4659 Encounter for fitting and adjustment of other gastrointestinal appliance and device: Secondary | ICD-10-CM

## 2021-03-02 DIAGNOSIS — Z86010 Personal history of colonic polyps: Secondary | ICD-10-CM

## 2021-03-02 DIAGNOSIS — Z8249 Family history of ischemic heart disease and other diseases of the circulatory system: Secondary | ICD-10-CM

## 2021-03-02 DIAGNOSIS — Z833 Family history of diabetes mellitus: Secondary | ICD-10-CM

## 2021-03-02 DIAGNOSIS — J449 Chronic obstructive pulmonary disease, unspecified: Secondary | ICD-10-CM | POA: Diagnosis present

## 2021-03-02 DIAGNOSIS — K311 Adult hypertrophic pyloric stenosis: Principal | ICD-10-CM | POA: Diagnosis present

## 2021-03-02 DIAGNOSIS — R7989 Other specified abnormal findings of blood chemistry: Secondary | ICD-10-CM

## 2021-03-02 DIAGNOSIS — Z8349 Family history of other endocrine, nutritional and metabolic diseases: Secondary | ICD-10-CM

## 2021-03-02 DIAGNOSIS — I130 Hypertensive heart and chronic kidney disease with heart failure and stage 1 through stage 4 chronic kidney disease, or unspecified chronic kidney disease: Secondary | ICD-10-CM | POA: Diagnosis present

## 2021-03-02 DIAGNOSIS — Z950 Presence of cardiac pacemaker: Secondary | ICD-10-CM

## 2021-03-02 DIAGNOSIS — J9811 Atelectasis: Secondary | ICD-10-CM | POA: Diagnosis not present

## 2021-03-02 DIAGNOSIS — K2980 Duodenitis without bleeding: Secondary | ICD-10-CM | POA: Diagnosis not present

## 2021-03-02 DIAGNOSIS — I959 Hypotension, unspecified: Secondary | ICD-10-CM | POA: Diagnosis not present

## 2021-03-02 DIAGNOSIS — K219 Gastro-esophageal reflux disease without esophagitis: Secondary | ICD-10-CM | POA: Diagnosis present

## 2021-03-02 DIAGNOSIS — Z90710 Acquired absence of both cervix and uterus: Secondary | ICD-10-CM

## 2021-03-02 DIAGNOSIS — I739 Peripheral vascular disease, unspecified: Secondary | ICD-10-CM | POA: Diagnosis not present

## 2021-03-02 DIAGNOSIS — I4821 Permanent atrial fibrillation: Secondary | ICD-10-CM | POA: Diagnosis not present

## 2021-03-02 DIAGNOSIS — R109 Unspecified abdominal pain: Secondary | ICD-10-CM | POA: Diagnosis present

## 2021-03-02 DIAGNOSIS — R9431 Abnormal electrocardiogram [ECG] [EKG]: Secondary | ICD-10-CM | POA: Diagnosis not present

## 2021-03-02 DIAGNOSIS — D689 Coagulation defect, unspecified: Secondary | ICD-10-CM | POA: Diagnosis present

## 2021-03-02 DIAGNOSIS — Z4682 Encounter for fitting and adjustment of non-vascular catheter: Secondary | ICD-10-CM | POA: Diagnosis not present

## 2021-03-02 DIAGNOSIS — Z9012 Acquired absence of left breast and nipple: Secondary | ICD-10-CM

## 2021-03-02 DIAGNOSIS — Z9049 Acquired absence of other specified parts of digestive tract: Secondary | ICD-10-CM

## 2021-03-02 DIAGNOSIS — Z66 Do not resuscitate: Secondary | ICD-10-CM | POA: Diagnosis not present

## 2021-03-02 DIAGNOSIS — Z888 Allergy status to other drugs, medicaments and biological substances status: Secondary | ICD-10-CM

## 2021-03-02 DIAGNOSIS — Z978 Presence of other specified devices: Secondary | ICD-10-CM

## 2021-03-02 DIAGNOSIS — I495 Sick sinus syndrome: Secondary | ICD-10-CM | POA: Diagnosis present

## 2021-03-02 DIAGNOSIS — I4892 Unspecified atrial flutter: Secondary | ICD-10-CM | POA: Diagnosis present

## 2021-03-02 DIAGNOSIS — D696 Thrombocytopenia, unspecified: Secondary | ICD-10-CM | POA: Diagnosis present

## 2021-03-02 DIAGNOSIS — R112 Nausea with vomiting, unspecified: Secondary | ICD-10-CM | POA: Diagnosis not present

## 2021-03-02 DIAGNOSIS — I899 Noninfective disorder of lymphatic vessels and lymph nodes, unspecified: Secondary | ICD-10-CM | POA: Diagnosis not present

## 2021-03-02 DIAGNOSIS — K319 Disease of stomach and duodenum, unspecified: Secondary | ICD-10-CM | POA: Diagnosis not present

## 2021-03-02 DIAGNOSIS — Z832 Family history of diseases of the blood and blood-forming organs and certain disorders involving the immune mechanism: Secondary | ICD-10-CM

## 2021-03-02 DIAGNOSIS — R1084 Generalized abdominal pain: Secondary | ICD-10-CM | POA: Diagnosis not present

## 2021-03-02 LAB — COMPREHENSIVE METABOLIC PANEL
ALT: 13 U/L (ref 0–44)
AST: 28 U/L (ref 15–41)
Albumin: 3 g/dL — ABNORMAL LOW (ref 3.5–5.0)
Alkaline Phosphatase: 61 U/L (ref 38–126)
Anion gap: 11 (ref 5–15)
BUN: 35 mg/dL — ABNORMAL HIGH (ref 8–23)
CO2: 29 mmol/L (ref 22–32)
Calcium: 9 mg/dL (ref 8.9–10.3)
Chloride: 103 mmol/L (ref 98–111)
Creatinine, Ser: 1.42 mg/dL — ABNORMAL HIGH (ref 0.44–1.00)
GFR, Estimated: 35 mL/min — ABNORMAL LOW (ref >60)
Glucose, Bld: 130 mg/dL — ABNORMAL HIGH (ref 70–99)
Potassium: 3.5 mmol/L (ref 3.5–5.1)
Sodium: 143 mmol/L (ref 135–145)
Total Bilirubin: 2.2 mg/dL — ABNORMAL HIGH (ref 0.3–1.2)
Total Protein: 5.9 g/dL — ABNORMAL LOW (ref 6.5–8.1)

## 2021-03-02 LAB — GLUCOSE, CAPILLARY
Glucose-Capillary: 132 mg/dL — ABNORMAL HIGH (ref 70–99)
Glucose-Capillary: 93 mg/dL (ref 70–99)

## 2021-03-02 LAB — CBC
HCT: 41.8 % (ref 36.0–46.0)
Hemoglobin: 13.8 g/dL (ref 12.0–15.0)
MCH: 31.5 pg (ref 26.0–34.0)
MCHC: 33 g/dL (ref 30.0–36.0)
MCV: 95.4 fL (ref 80.0–100.0)
Platelets: 161 10*3/uL (ref 150–400)
RBC: 4.38 MIL/uL (ref 3.87–5.11)
RDW: 14.6 % (ref 11.5–15.5)
WBC: 9.9 10*3/uL (ref 4.0–10.5)
nRBC: 0 % (ref 0.0–0.2)

## 2021-03-02 LAB — PROTIME-INR
INR: 1.4 — ABNORMAL HIGH (ref 0.8–1.2)
INR: 1.6 — ABNORMAL HIGH (ref 0.8–1.2)
Prothrombin Time: 17.5 seconds — ABNORMAL HIGH (ref 11.4–15.2)
Prothrombin Time: 19.2 seconds — ABNORMAL HIGH (ref 11.4–15.2)

## 2021-03-02 MED ORDER — ONDANSETRON HCL 4 MG/2ML IJ SOLN
4.0000 mg | Freq: Four times a day (QID) | INTRAMUSCULAR | Status: DC | PRN
  Administered 2021-03-06 (×2): 4 mg via INTRAVENOUS
  Filled 2021-03-02 (×2): qty 2

## 2021-03-02 MED ORDER — ALPRAZOLAM 0.25 MG PO TABS
0.2500 mg | ORAL_TABLET | Freq: Three times a day (TID) | ORAL | Status: DC | PRN
  Administered 2021-03-03 – 2021-03-06 (×2): 0.25 mg via ORAL
  Filled 2021-03-02 (×2): qty 1

## 2021-03-02 MED ORDER — MORPHINE SULFATE (PF) 2 MG/ML IV SOLN: 0.5000 mg | INTRAVENOUS | 0 refills | Status: DC | PRN

## 2021-03-02 MED ORDER — MOMETASONE FURO-FORMOTEROL FUM 200-5 MCG/ACT IN AERO: 2.0000 | INHALATION_SPRAY | Freq: Two times a day (BID) | RESPIRATORY_TRACT | Status: AC

## 2021-03-02 MED ORDER — ALBUTEROL SULFATE HFA 108 (90 BASE) MCG/ACT IN AERS: 2.0000 | INHALATION_SPRAY | RESPIRATORY_TRACT | Status: DC | PRN

## 2021-03-02 MED ORDER — DEXTROSE-NACL 5-0.45 % IV SOLN: INTRAVENOUS | Status: AC

## 2021-03-02 MED ORDER — NITROGLYCERIN 0.4 MG SL SUBL: 0.4000 mg | SUBLINGUAL_TABLET | SUBLINGUAL | 12 refills | Status: AC

## 2021-03-02 MED ORDER — IPRATROPIUM-ALBUTEROL 0.5-2.5 (3) MG/3ML IN SOLN: 3.0000 mL | Freq: Four times a day (QID) | RESPIRATORY_TRACT | Status: AC | PRN

## 2021-03-02 MED ORDER — IPRATROPIUM-ALBUTEROL 0.5-2.5 (3) MG/3ML IN SOLN: 3.0000 mL | Freq: Four times a day (QID) | RESPIRATORY_TRACT | Status: DC | PRN

## 2021-03-02 MED ORDER — DILTIAZEM HCL-DEXTROSE 125-5 MG/125ML-% IV SOLN (PREMIX)
5.0000 mg/h | INTRAVENOUS | Status: DC
  Administered 2021-03-02 (×2): 15 mg/h via INTRAVENOUS
  Filled 2021-03-02 (×3): qty 125

## 2021-03-02 MED ORDER — UMECLIDINIUM BROMIDE 62.5 MCG/INH IN AEPB: 1.0000 | INHALATION_SPRAY | Freq: Every day | RESPIRATORY_TRACT | Status: DC

## 2021-03-02 MED ORDER — UMECLIDINIUM BROMIDE 62.5 MCG/INH IN AEPB
1.0000 | INHALATION_SPRAY | Freq: Every day | RESPIRATORY_TRACT | Status: DC
  Administered 2021-03-04 – 2021-03-09 (×6): 1 via RESPIRATORY_TRACT
  Filled 2021-03-02: qty 7

## 2021-03-02 MED ORDER — LACTATED RINGERS IV SOLN: 50.0000 mL | INTRAVENOUS | 0 refills | Status: DC

## 2021-03-02 MED ORDER — ACETAMINOPHEN 160 MG/5ML PO SOLN
650.0000 mg | Freq: Four times a day (QID) | ORAL | 0 refills | Status: AC | PRN
Start: 2021-03-02 — End: ?

## 2021-03-02 MED ORDER — ALPRAZOLAM 0.25 MG PO TABS: 0.2500 mg | ORAL_TABLET | Freq: Three times a day (TID) | ORAL | 0 refills | Status: DC | PRN

## 2021-03-02 MED ORDER — ENOXAPARIN SODIUM 30 MG/0.3ML IJ SOSY: 30.0000 mg | PREFILLED_SYRINGE | INTRAMUSCULAR | Status: DC

## 2021-03-02 MED ORDER — DILTIAZEM HCL-DEXTROSE 125-5 MG/125ML-% IV SOLN (PREMIX): 5.0000 mg/h | INTRAVENOUS | Status: DC

## 2021-03-02 MED ORDER — MOMETASONE FURO-FORMOTEROL FUM 200-5 MCG/ACT IN AERO
2.0000 | INHALATION_SPRAY | Freq: Two times a day (BID) | RESPIRATORY_TRACT | Status: DC
  Administered 2021-03-03 – 2021-03-09 (×13): 2 via RESPIRATORY_TRACT
  Filled 2021-03-02: qty 8.8

## 2021-03-02 MED ORDER — ONDANSETRON HCL 4 MG PO TABS: 4.0000 mg | ORAL_TABLET | Freq: Four times a day (QID) | ORAL | Status: DC | PRN

## 2021-03-02 MED ORDER — METOPROLOL TARTRATE 5 MG/5ML IV SOLN
5.0000 mg | Freq: Four times a day (QID) | INTRAVENOUS | Status: DC
  Administered 2021-03-02 – 2021-03-05 (×9): 5 mg via INTRAVENOUS
  Filled 2021-03-02 (×12): qty 5

## 2021-03-02 NOTE — TOC Transition Note (Signed)
Transition of Care [facility]) - CM/SW Discharge Note   Patient Details  Name: [name] [name] MRN: [id number] Date of Birth: 1927/08/18  Transition of Care [facility]) CM/SW Contact:  Izola Price, RN Phone Number: 03/02/2021, 9:23 AM   Clinical Narrative:  6/11: To be discharged today to [facility] with discharge instructions and issues to call MD for. Simmie Davies RN CM     Final next level of care: Home/Self Care Barriers to Discharge: Barriers Resolved   Patient Goals and CMS Choice        Discharge Placement                       Discharge Plan and Services                DME Arranged: N/A DME Agency: NA       HH Arranged: NA HH Agency: NA        Social Determinants of Health (SDOH) Interventions     Readmission Risk Interventions No flowsheet data found.

## 2021-03-02 NOTE — Progress Notes (Signed)
Patient transferred at this time to [facility] transported via stretcher by [facility]. Cardizem drip infusing at 15mg  per MAR. NG tube intact and patent. NAD upon departure. Patient given bath prior to departure by CNA. [facility] personnel given report and provided with transfer packet. Writer attempted to call [facility] hospital RN for report awaiting callback. Daughters at bedside and aware of transfer.

## 2021-03-02 NOTE — Progress Notes (Signed)
[facility] PRO[facility]ESS NOTE (cpt [phone])  Hospital Day(s): 4.   Interval History: Patient still present, pending transfer.  Complains of thirst, NG tube in place patent and functioning.  Output still appears bilious.  Otherwise no complaints of nausea, vomiting or abdominal pain.  Reports some dysuria.  Yesterday's evaluation and GI recommendations noted.   Review of Systems:  Constitutional: denies fever, chills  HEENT: denies cough or congestion  Respiratory: denies any shortness of breath  Cardiovascular: denies chest pain or palpitations  Gastrointestinal: denies abdominal pain, N/V, or diarrhea/and bowel function as per interval history Genitourinary: denies burning with urination or urinary frequency   Vital signs in last 24 hours: [min-max] current  Temp:  [97.3 F (36.3 C)-98.9 F (37.2 C)] 98.9 F (37.2 C) (06/11 1130) Pulse Rate:  [88-110] 103 (06/11 1130) Resp:  [16-25] 19 (06/11 1130) BP: (108-137)/(49-91) 131/64 (06/11 1130) SpO2:  [91 %-100 %] 100 % (06/11 1130) Weight:  [77.1 kg] 77.1 kg (06/10 1531)     Height: 5\' 3"  (160 cm) Weight: 77.1 kg BMI (Calculated): 30.12   Intake/Output last 2 shifts:  06/10 0701 - 06/11 0700 In: 320 [P.O.:120; I.V.:200] Out: 450 [Urine:250; Emesis/NG output:200]   Physical Exam:  Constitutional: alert, cooperative and no distress HENT: normocephalic without obvious abnormality. NGT in place; output bilious Eyes: PERRL, EOM's grossly intact and symmetric Respiratory: breathing non-labored at rest Cardiovascular: regular rate and sinus rhythm Gastrointestinal: soft, non-tender, and non-distended, no rebound/guarding Musculoskeletal: no edema or wounds, motor and sensation grossly intact, NT     Labs:  CBC Latest Ref Rng & Units 03/01/2021 02/28/2021 02/27/2021  WBC 4.0 - 10.5 K/uL 8.7 9.0 7.7  Hemoglobin 12.0 - 15.0 g/dL 14.4 14.2 13.6  Hematocrit 36.0 - 46.0 % 43.8 43.0 40.4  Platelets 150 - 400 K/uL 162  169 178   CMP Latest Ref Rng & Units 03/01/2021 02/28/2021 02/28/2021  Glucose 70 - 99 mg/dL 79 - 95  BUN 8 - 23 mg/dL 43(H) - 38(H)  Creatinine 0.44 - 1.00 mg/dL 1.51(H) - 1.52(H)  Sodium 135 - 145 mmol/L 146(H) - 143  Potassium 3.5 - 5.1 mmol/L 3.9 - 4.3  Chloride 98 - 111 mmol/L 103 - 103  CO2 22 - 32 mmol/L 29 - 29  Calcium 8.9 - 10.3 mg/dL 9.7 - 9.9  Total Protein 6.5 - 8.1 g/dL 6.9 - 7.4  Total Bilirubin 0.3 - 1.2 mg/dL 2.1(H) 1.8(H) 1.8(H)  Alkaline Phos 38 - 126 U/L 59 - 75  AST 15 - 41 U/L 28 - 27  ALT 0 - 44 U/L 16 - 15     Assessment/Plan: (ICD-10's: K31.89) 87 y.o. 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    - Appreciate GI assistance; anticipating transfer for further evaluation via MRI/EUS.               - Again, this is a rather challenging situation given her age and comorbid conditions. There are potential surgical options (ex; biliary stent; gastrojejunostomy); however, these are rather extensive and family is not keen on pursuing surgery.  - Continue NGT to LIS: monitor and record output             -May have ice chips + IVF resuscitation             - Monitor abdominal examination             -  Pain control prn; antiemetics prn             - Further management per primary service; we will otherwise follow along should she remain at [facility].  All of the above findings and recommendations were discussed with the patient, patient's family (daughter at bedside), and the medical team, and all of patient's and family's questions were answered to their expressed satisfaction.  -- Ronny Bacon, MD [facility] 03/02/2021, 1:12 PM

## 2021-03-02 NOTE — Consult Note (Signed)
[facility] Consultation Note  Patient ID: [name] [name], MRN: [id number], DOB/AGE: [birth year] 87 y.o. Admit date: 02/26/2021   Date of Consult: 03/02/2021 Primary Physician: Tracie Harrier, MD Primary Cardiologist: Paraschos  Chief Complaint:  Chief Complaint  Patient presents with   Nausea   Diarrhea   Chest Pain   Reason for Consult:  Atrial fibrillation  HPI: 87 y.o. female with chronic kidney disease stage III with a glomerular filtration rate of 32 and chronic nonvalvular atrial fibrillation now with rapid rate due to significant abdominal illness and weakness.  The patient has had evaluation with this duodenal mass which should have may need significant treatment.  Currently the patient has had reasonable heart rate control with diltiazem drip due to inability to take any oral medication management.  There appears not to be any anginal symptoms or congestive heart failure at this time  Past Medical History:  Diagnosis Date   Anemia    Arrhythmia    atrial fibrillation   Asthma    Breast cancer ([place]) 1978   left   CAD (coronary artery disease)    CHF (congestive heart failure) (HCC)    COPD (chronic obstructive pulmonary disease) (HCC)    DVT (deep venous thrombosis) (HCC)    Esophageal stricture    Hemorrhoids    History of knee replacement, total    Hyperlipidemia    Hypertension    Ischemic cardiomyopathy    Osteoporosis    Paroxysmal atrial fibrillation (HCC)    Peripheral arterial disease (HCC)    Sick sinus syndrome ([place])       Surgical History:  Past Surgical History:  Procedure Laterality Date   ABDOMINAL HYSTERECTOMY  1985   HEMORRHOID SURGERY     ILIAC ARTERY STENT     INTRAOPERATIVE ARTERIOGRAM Right    MASTECTOMY Left 1978   PACEMAKER INSERTION Left 01/11/2020   Procedure: PACEMAKER GENERATOR CHANGE OUT;  Surgeon: Isaias Cowman, MD;  Location: [facility] ORS;  Service: Cardiovascular;  Laterality: Left;   REDUCTION MAMMAPLASTY  Right 1985   impant placed then removed     Home Meds: Prior to Admission medications   Medication Sig Start Date End Date Taking? Authorizing Provider  atorvastatin (LIPITOR) 40 MG tablet Take 40 mg by mouth every evening.   Yes [provider]  budesonide-formoterol (SYMBICORT) 160-4.5 MCG/ACT inhaler Inhale 2 puffs into the lungs 2 (two) times daily. 07/27/18  Yes Wilhelmina Mcardle, MD  DALIRESP 250 MCG TABS Take 250 mcg by mouth daily. 02/11/21  Yes [provider]  diltiazem (CARDIZEM CD) 180 MG 24 hr capsule Take 180 mg by mouth daily.   Yes [provider]  escitalopram (LEXAPRO) 5 MG tablet Take 5 mg by mouth daily. 02/07/21  Yes [provider]  furosemide (LASIX) 40 MG tablet Take 40 mg by mouth daily. 02/13/21  Yes [provider]  ipratropium-albuterol (DUONEB) 0.5-2.5 (3) MG/3ML SOLN Take 3 mLs by nebulization every 4 (four) hours as needed.   Yes [provider]  metolazone (ZAROXOLYN) 2.5 MG tablet Take 2.5 mg by mouth as directed. Pt takes every Monday. 09/21/18  Yes [provider]  metoprolol succinate (TOPROL-XL) 50 MG 24 hr tablet Take 50 mg by mouth daily. 01/20/21  Yes [provider]  nitroGLYCERIN (NITROSTAT) 0.4 MG SL tablet Place 0.4 mg under the tongue as directed. 11/01/20  Yes [provider]  oxybutynin (DITROPAN) 5 MG tablet Take 5 mg by mouth 3 (three) times daily. 01/06/17  Yes [provider]  Potassium Chloride ER 20 MEQ TBCR Take 20 tablets by mouth 2 (two) times daily. 01/20/21  Yes [provider]  quinapril (ACCUPRIL) 40 MG tablet Take 40 mg by mouth daily.   Yes [provider]  tiotropium (SPIRIVA) 18 MCG inhalation capsule Place 18 mcg into inhaler and inhale daily.   Yes [provider]  warfarin (COUMADIN) 5 MG tablet Take 5 mg by mouth daily.   Yes [provider]  albuterol (PROVENTIL HFA;VENTOLIN HFA) 108 (90 BASE) MCG/ACT inhaler  Inhale 2 puffs into the lungs every 6 (six) hours as needed for wheezing or shortness of breath.    [provider]    Inpatient Medications:   mometasone-formoterol  2 puff Inhalation BID   nitroGLYCERIN  0.4 mg Sublingual UD    diltiazem (CARDIZEM) infusion 15 mg/hr (03/02/21 0349)   lactated ringers 50 mL/hr at 03/02/21 0349   promethazine (PHENERGAN) injection (IM or IVPB)      Allergies:  Allergies  Allergen Reactions   Penicillins Anaphylaxis   Amoxicillin    Beta Adrenergic Blockers    Meloxicam     Other reaction(s): Unknown   Naproxen     Other reaction(s): Unknown   Nsaids     Other reaction(s): Unknown    Social History   Socioeconomic History   Marital status: Widowed    Spouse name: Not on file   Number of children: Not on file   Years of education: Not on file   Highest education level: Not on file  Occupational History   Not on file  Tobacco Use   Smoking status: Former    Pack years: 0.00    Types: Cigarettes    Quit date: 12/05/1986    Years since quitting: 34.2   Smokeless tobacco: Never  Vaping Use   Vaping Use: Never used  Substance and Sexual Activity   Alcohol use: No    Alcohol/week: 0.0 standard drinks   Drug use: No   Sexual activity: Not on file  Other Topics Concern   Not on file  Social History Narrative   Not on file   Social Determinants of Health   Financial Resource Strain: Not on file  Food Insecurity: Not on file  Transportation Needs: Not on file  Physical Activity: Not on file  Stress: Not on file  Social Connections: Not on file  Intimate Partner Violence: Not on file     Family History  Problem Relation Age of Onset   Hypertension Father    Heart disease Father    Clotting disorder Father    Cancer Mother        breast   Heart disease Sister    Thyroid disease Sister    Diabetes Sister    Breast cancer Daughter        55's, twice diagnosed, double mastectomy   Breast cancer Daughter         11's     Review of Systems    Labs: No results for input(s): CKTOTAL, CKMB, TROPONINI in the last 72 hours. Lab Results  Component Value Date   WBC 8.7 03/01/2021   HGB 14.4 03/01/2021   HCT 43.8 03/01/2021   MCV 96.5 03/01/2021   PLT 162 03/01/2021    Recent Labs  Lab 03/01/21 0500  NA 146*  K 3.9  CL 103  CO2 29  BUN 43*  CREATININE 1.51*  CALCIUM 9.7  PROT 6.9  BILITOT 2.1*  ALKPHOS 59  ALT 16  AST 28  GLUCOSE 79   No results found for: CHOL, HDL, LDLCALC, TRIG No results found for: DDIMER  Radiology/Studies:  CT Abdomen Pelvis Wo Contrast  Result Date: 02/26/2021 CLINICAL DATA:  Acute, non localized abdominal pain. Nausea and diarrhea. EXAM: CT ABDOMEN AND PELVIS WITHOUT CONTRAST TECHNIQUE: Multidetector CT imaging of the abdomen and pelvis was performed following the standard protocol without IV contrast. COMPARISON:  None. FINDINGS: Lower chest: Enlarged heart. Dense atheromatous coronary artery and aortic calcifications. Unremarkable lung bases. Hepatobiliary: Surgically absent gallbladder. Dilated common duct with a maximum diameter of 16.7 mm. No significant intrahepatic ductal dilatation. No visible intraductal stones. Pancreas: Unremarkable. No pancreatic ductal dilatation or surrounding inflammatory changes. Spleen: Normal in size without focal abnormality. Adrenals/Urinary Tract: Adrenal glands are unremarkable. Kidneys are normal, without renal calculi, focal lesion, or hydronephrosis. Bladder is unremarkable. Stomach/Bowel: Dilated stomach and proximal duodenum to the level of the junction of the 2nd and 3rd portions of the duodenum. There is soft tissue thickening at that location, including the region of the ampulla. Minimal sigmoid colon diverticulosis.  No evidence of appendicitis. Vascular/Lymphatic: Extensive atheromatous arterial calcifications, including the origins of both renal arteries, superior mesenteric artery and celiac axis. Reproductive: Status  post hysterectomy. No adnexal masses. Other: Small umbilical hernia containing fat. Musculoskeletal: Lumbar and lower thoracic spine degenerative changes. Left hip total prosthesis. IMPRESSION: 1. Dilated stomach and proximal duodenum to the level of the junction of the 2nd and 3rd portions of the duodenum, possibly due to a duodenal mass at that location. The possible mass could also be obstructing the distal common duct at the ampulla. 2. Extensive calcific coronary artery and aortic atherosclerosis. Electronically Signed   By: Claudie Revering M.D.   On: 02/26/2021 14:48   DG Chest 2 View  Result Date: 02/26/2021 CLINICAL DATA:  Chest pain EXAM: CHEST - 2 VIEW COMPARISON:  12/18/2020 FINDINGS: Chronic interstitial prominence. No new consolidation or edema. Stable elevation of the right hemidiaphragm. No pleural effusion. No pneumothorax. Stable cardiomediastinal contours. Left chest wall dual lead pacemaker. Diffusely decreased osseous mineralization with poor visualization of vertebral bodies. Right shoulder arthroplasty. Left shoulder osteoarthritis. IMPRESSION: No acute process in the chest. Electronically Signed   By: Macy Mis M.D.   On: 02/26/2021 13:57   DG Abd 1 View  Result Date: 03/01/2021 CLINICAL DATA:  Check gastric catheter placement EXAM: ABDOMEN - 1 VIEW COMPARISON:  Film from earlier in the same day. FINDINGS: Gastric catheter is again noted within the stomach. Proximal side port lies near the gastroesophageal junction. This could be advanced a few more cm deeper into the stomach. IMPRESSION: Gastric catheter as described which could be advanced further into the stomach. Electronically Signed   By: Inez Catalina M.D.   On: 03/01/2021 18:39   CT CHEST ABDOMEN PELVIS W CONTRAST  Result Date: 02/26/2021 CLINICAL DATA:  Nausea and left-sided chest pain. EXAM: CT CHEST, ABDOMEN, AND PELVIS WITH CONTRAST TECHNIQUE: Multidetector CT imaging of the chest, abdomen and pelvis was performed  following the standard protocol during bolus administration of intravenous contrast. CONTRAST:  35mL OMNIPAQUE IOHEXOL 300 MG/ML  SOLN COMPARISON:  February 26, 2021 FINDINGS: CT CHEST FINDINGS Cardiovascular: There is marked severity calcification of the thoracic aorta. There is mild cardiomegaly with marked severity coronary artery calcification. No pericardial effusion. Mediastinum/Nodes: No enlarged mediastinal, hilar, or axillary lymph nodes. Thyroid gland, trachea, and esophagus demonstrate no significant findings. Lungs/Pleura: Very mild atelectasis is seen within the right lower lobe.  There is no evidence of acute infiltrate, pleural effusion or pneumothorax. Musculoskeletal: A right shoulder replacement is seen. Multilevel degenerative changes seen throughout the thoracic spine. CT ABDOMEN PELVIS FINDINGS Hepatobiliary: No focal liver abnormality is seen. Status post cholecystectomy. The common bile duct is dilated (1.3 cm). Pancreas: Unremarkable. No pancreatic ductal dilatation or surrounding inflammatory changes. Spleen: Normal in size without focal abnormality. Adrenals/Urinary Tract: Adrenal glands are unremarkable. Kidneys are normal, without renal calculi, focal lesion, or hydronephrosis. The urinary bladder is limited in evaluation secondary to overlying streak artifact. Stomach/Bowel: Stomach is within normal limits. Appendix appears normal. Persistent dilatation of the proximal duodenum is seen to the level of the junction of the second and third portions. Persistent soft tissue thickening is also seen at this level. Noninflamed diverticula are seen within the sigmoid colon. Vascular/Lymphatic: Aortic atherosclerosis. No enlarged abdominal or pelvic lymph nodes. Reproductive: Status post hysterectomy. No adnexal masses. Other: No abdominal wall hernia or abnormality. No abdominopelvic ascites. Musculoskeletal: Multilevel degenerative changes are noted throughout the lumbar spine. IMPRESSION: 1. No  acute cardiopulmonary disease. 2. Persistent dilatation of the proximal duodenum which may represent sequelae associated with a duodenal mass. Further evaluation with a GI consult and subsequent endoscopy is recommended. 3. Sigmoid diverticulosis. Electronically Signed   By: Virgina Norfolk M.D.   On: 02/26/2021 16:35   DG Abd 2 Views  Result Date: 02/28/2021 CLINICAL DATA:  Gastric outlet obstruction, nausea, vomiting EXAM: ABDOMEN - 2 VIEW COMPARISON:  02/27/2021 FINDINGS: Gaseous distention of the stomach. Remainder bowel decompressed. No organomegaly or free air. Aortic calcifications. No visible aneurysm. Cardiomegaly. No confluent opacity in the visualized lungs. IMPRESSION: Gaseous distention of the stomach. Electronically Signed   By: Rolm Baptise M.D.   On: 02/28/2021 09:51   DG Abd Portable 1V  Result Date: 02/27/2021 CLINICAL DATA:  Abdominal distension EXAM: PORTABLE ABDOMEN - 1 VIEW COMPARISON:  Portable exam 0902 hours without priors for comparison FINDINGS: Contrast material within kidneys, renal collecting systems, and bladder. Nonobstructive bowel gas pattern. No bowel dilatation or bowel wall thickening. Osseous demineralization with multilevel degenerative disc and facet disease changes of thoracolumbar spine. Atherosclerotic calcification aorta. LEFT hip prosthesis. IMPRESSION: Nonobstructive bowel gas pattern. Aortic Atherosclerosis (ICD10-I70.0). Electronically Signed   By: Lavonia Dana M.D.   On: 02/27/2021 11:00   DG Abd Portable 2V  Result Date: 03/01/2021 CLINICAL DATA:  Constipation EXAM: PORTABLE ABDOMEN - 2 VIEW COMPARISON:  February 28, 2021. FINDINGS: Supine and upright images obtained. Nasogastric tube tip and side port in stomach. There is moderate air in stomach. There is moderate stool in colon. There is no bowel dilatation or air-fluid level to suggest bowel obstruction. No evident free air. Status post total hip replacement on the left. Pacemaker leads attached to right  atrium and right ventricle. There is aortic atherosclerosis. There is mild bibasilar lung atelectasis. IMPRESSION: Nasogastric tube tip and side port in stomach. No bowel obstruction or free air evident. Moderate air in stomach. Bibasilar atelectasis.  Aortic Atherosclerosis (ICD10-I70.0). Electronically Signed   By: Lowella Grip III M.D.   On: 03/01/2021 09:05   DG [place] Dys Tube Plc W/Fl W/Rad  Result Date: 02/28/2021 CLINICAL DATA:  NG tube placement. EXAM: NASO G TUBE PLACEMENT WITH FL AND WITH RAD CONTRAST:  None. FLUOROSCOPY TIME:  Fluoroscopy Time:  0 minutes 48 seconds Radiation Exposure Index (if provided by the fluoroscopic device): 5.4 mGy Number of Acquired Spot Images: 2 COMPARISON:  Abdomen 02/28/2021. FINDINGS: NG tube successfully placed under fluoroscopic  guidance. Tube tip and side hole placed are in the stomach. Gastric distention noted. Cardiac pacer noted. IMPRESSION: NG tube successfully placed under fluoroscopic guidance. Gastric distention noted. Electronically Signed   By: Marcello Moores  Register   On: 02/28/2021 08:48    EKG: Atrial fibrillation with left anterior fascicular block with heart rate control at 98 bpm  Weights: Filed Weights   02/26/21 1301 03/01/21 1531  Weight: 77.1 kg 77.1 kg     Physical Exam: Blood pressure 134/75, pulse 95, temperature 98.1 F (36.7 C), temperature source Oral, resp. rate (!) 21, height 5\' 3"  (1.6 m), weight 77.1 kg, SpO2 96 %. Body mass index is 30.11 kg/m. General: Well developed, well nourished, in no acute distress. Head eyes ears nose throat: Normocephalic, atraumatic, sclera non-icteric, no xanthomas, nares are without discharge. No apparent thyromegaly and/or mass  Lungs: Normal respiratory effort.  no wheezes, no rales, no rhonchi.  Heart: Irregular with normal S1 S2. no murmur gallop, no rub, PMI is normal size and placement, carotid upstroke normal without bruit, jugular venous pressure is normal Abdomen: Soft, non-tender,  non-distended with normoactive bowel sounds. No hepatomegaly. No rebound/guarding. No obvious abdominal masses. Abdominal aorta is normal size without bruit Extremities: Trace edema. no cyanosis, no clubbing, no ulcers  Peripheral : 2+ bilateral upper extremity pulses, 2+ bilateral femoral pulses, 2+ bilateral dorsal pedal pulse Neuro: Alert and oriented. No facial asymmetry. No focal deficit. Moves all extremities spontaneously. Musculoskeletal: Normal muscle tone without kyphosis Psych:  Responds to questions appropriately with a normal affect.    Assessment: 87 year old female with duodenal mass chronic kidney disease stage III chronic nonvalvular atrial fibrillation currently with good heart rate control with intravenous diltiazem and no current evidence of acute coronary syndrome or myocardial infarction  Plan: 1.  Continue treatment atrial fibrillation with intravenous diltiazem at current dose for heart rate control below 100 bpm 2.  No further cardiac diagnostics and/or treatment at this time due to no evidence of angina and or congestive heart failure 3.  Proceed to invasive procedures without restriction due to no significant cardiovascular concerns and good control of heart rate with diltiazem drip  Signed, Corey Skains M.D. [facility] 03/02/2021, 6:46 AM

## 2021-03-02 NOTE — Progress Notes (Signed)
Pt arrived to unit from  [facility], A/O x 4,  CCMD called ,CHG given, pt oriented to unit, Pt currently in Afib HR 105-120's. NG tube in right nare on intermitted suction.Pace maker on  left upper chest. Pt arrived to unit with  Cardizem drip @ 15ML to Right FA  this assess was very red and painful to touch. IV Was D/C on admission at [facility]. Will continue to monitor.   Albin Felling Misty Rago, RN      03/02/21 1609  Vitals  Temp 98.4 F (36.9 C)  Temp Source Oral  BP (!) 156/85  MAP (mmHg) 98  BP Location Right Arm  BP Method Automatic  Patient Position (if appropriate) Lying  Pulse Rate (!) 114  Pulse Rate Source Monitor  ECG Heart Rate (!) 128  Resp 19  Level of Consciousness  Level of Consciousness Alert  Oxygen Therapy  SpO2 95 %  O2 Device Room Air  O2 Flow Rate (L/min) 0 L/min  Pain Assessment  Pain Scale 0-10  Pain Score 0  MEWS Score  MEWS Temp 0  MEWS Systolic 0  MEWS Pulse 2  MEWS RR 0  MEWS LOC 0  MEWS Score 2  MEWS Score Color Yellow

## 2021-03-02 NOTE — Discharge Summary (Signed)
[name] [name] QQI:[id number] DOB: 1926-11-11 DOA: 02/26/2021  PCP: Tracie Harrier, MD  Admit date: 02/26/2021 Discharge date: 03/02/2021  Admitted From: home Disposition:  Transfer to [facility]    Discharge Condition:Stable CODE STATUS:Full  Diet recommendation: NPO  Brief/Interim Summary: Per HPI: 87 year old F with PMH of COPD, CKD-3A, SSS s/p PPM, DVT on warfarin, CAD, diastolic CHF, HTN, HLD, GERD, PVD and remote left breast cancer presenting with abdominal pain, intermittent nausea, vomiting and diarrhea for about 2 weeks.  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  GI and general surgery were consulted. She had NGT placed. She has hx/o persistent afib, her HR increased yesterday to 130's. Cardiology was consulted. Patient was started on cardizem gtt, HR improved enough for her to undergo EGD, found with thickened folds mucosa in the antrum which was biopsied and duodenal deformit, also biopsies. Since patient needed MRI/MRCP  to evaluate for any bily duct abnormalities or mass causing the extrinsic compression. Since patient has pacemaker , patient has to be transferred to [facility] for this.    Abdominal pain, nausea, vomiting and diarrhea in the setting of duodenal mass: CT shows possible duodenal mass at the junction of second and third part causing some degree of obstructive change proximally.   6/9-status post NG tube placement after KUB with gastric distension. INR 1.5, proceeded with EGD on 6/10, results above. Because etiology of extrinsic compression is not clear on CT, MRI/MRCP may help evaluate this further vs EUS. Pt with pacemaker will need to transfer to [place] to have this done Consult GI at [facility] on arrival Gsx following NPO, with NGT       Persistent A. fib:  Currently heart rate controlled on cardizem gtt Coumadin on hold.  Was given vit K to reverse INR If no plans for procedure soon, can start heparin gtt..>discuss with GI Cardiology was following        Essential (primary) hypertension: normotensive Dc 'd beta blk iv, on cardizem gtt     DVT:  On warfarin at home.on hold currently as above Will need heparin gtt if no procedures planned, discuss with GI     COPD (chronic obstructive pulmonary disease) ([place]): Without exacerbation Stable -continue Bronchodilators   CAD (coronary artery disease): Stable. -As needed nitro   Chronic diastolic CHF (congestive heart failure) ([place]): TTE in 05/2020 with LVEF> 55% and moderate TVR.  Overall, patient appears euvolemic. -Continue to hold diuretics Lasix and metolazone as she is n.p.o. Continue gentle hydration Monitor I's and O's           CKD-3A: Cr slightly higher than baseline.  Close to baseline Continue holding diuretics     Hyperlipidemia, unspecified Hold statin since npo  Discharge Diagnoses:  Principal Problem:   Abdominal pain Active Problems:   Essential (primary) hypertension   Hyperlipidemia, unspecified   Pacemaker   Atrial fibrillation (HCC)   COPD (chronic obstructive pulmonary disease) (HCC)   DVT (deep venous thrombosis) (HCC)   CAD (coronary artery disease)   Chronic diastolic CHF (congestive heart failure) (HCC)   Elevated lactic acid level   CKD (chronic kidney disease), stage IIIa   Nausea vomiting and diarrhea    Discharge Instructions  Discharge Instructions     Diet - low sodium heart healthy   Complete by: As directed    Increase activity slowly   Complete by:  As directed       Allergies as of 03/02/2021       Reactions   Penicillins Anaphylaxis   Amoxicillin    Beta Adrenergic Blockers    Meloxicam    Other reaction(s): Unknown   Naproxen    Other reaction(s): Unknown   Nsaids    Other reaction(s): Unknown        Medication List     STOP taking these medications    atorvastatin 40  MG tablet Commonly known as: LIPITOR   budesonide-formoterol 160-4.5 MCG/ACT inhaler Commonly known as: SYMBICORT Replaced by: mometasone-formoterol 200-5 MCG/ACT Aero   Daliresp 250 MCG Tabs Generic drug: Roflumilast   diltiazem 180 MG 24 hr capsule Commonly known as: CARDIZEM CD   escitalopram 5 MG tablet Commonly known as: LEXAPRO   furosemide 40 MG tablet Commonly known as: LASIX   metolazone 2.5 MG tablet Commonly known as: ZAROXOLYN   metoprolol succinate 50 MG 24 hr tablet Commonly known as: TOPROL-XL   oxybutynin 5 MG tablet Commonly known as: DITROPAN   Potassium Chloride ER 20 MEQ Tbcr   quinapril 40 MG tablet Commonly known as: ACCUPRIL   tiotropium 18 MCG inhalation capsule Commonly known as: SPIRIVA   warfarin 5 MG tablet Commonly known as: COUMADIN       TAKE these medications    acetaminophen 160 MG/5ML solution Commonly known as: TYLENOL Take 20.3 mLs (650 mg total) by mouth every 6 (six) hours as needed for mild pain.   albuterol 108 (90 Base) MCG/ACT inhaler Commonly known as: VENTOLIN HFA Inhale 2 puffs into the lungs every 4 (four) hours as needed for wheezing or shortness of breath. What changed: when to take this   ALPRAZolam 0.25 MG tablet Commonly known as: XANAX Take 1 tablet (0.25 mg total) by mouth 3 (three) times daily as needed for anxiety.   dilTIAZem HCl-Dextrose 125-5 MG/125ML-% Soln infusion Inject 5-15 mg/hr into the vein continuous.   ipratropium-albuterol 0.5-2.5 (3) MG/3ML Soln Commonly known as: DUONEB Take 3 mLs by nebulization every 6 (six) hours as needed. What changed: when to take this   lactated ringers infusion Inject 50 mLs into the vein continuous.   mometasone-formoterol 200-5 MCG/ACT Aero Commonly known as: DULERA Inhale 2 puffs into the lungs 2 (two) times daily. Replaces: budesonide-formoterol 160-4.5 MCG/ACT inhaler   morphine 2 MG/ML injection Inject 0.25 mLs (0.5 mg total) into the vein  every 3 (three) hours as needed.   nitroGLYCERIN 0.4 MG SL tablet Commonly known as: NITROSTAT Place 1 tablet (0.4 mg total) under the tongue as directed.        Allergies  Allergen Reactions   Penicillins Anaphylaxis   Amoxicillin    Beta Adrenergic Blockers    Meloxicam     Other reaction(s): Unknown   Naproxen     Other reaction(s): Unknown   Nsaids     Other reaction(s): Unknown    Consultations: GI, cardiology general surgery   Procedures/Studies: CT Abdomen Pelvis Wo Contrast  Result Date: 02/26/2021 CLINICAL DATA:  Acute, non localized abdominal pain. Nausea and diarrhea. EXAM: CT ABDOMEN AND PELVIS WITHOUT CONTRAST TECHNIQUE: Multidetector CT imaging of the abdomen and pelvis was performed following the standard protocol without IV contrast. COMPARISON:  None. FINDINGS: Lower chest: Enlarged heart. Dense atheromatous coronary artery and aortic calcifications. Unremarkable lung bases. Hepatobiliary: Surgically absent gallbladder. Dilated common duct with a maximum diameter of 16.7 mm. No significant intrahepatic ductal dilatation. No visible intraductal stones. Pancreas: Unremarkable. No pancreatic ductal  dilatation or surrounding inflammatory changes. Spleen: Normal in size without focal abnormality. Adrenals/Urinary Tract: Adrenal glands are unremarkable. Kidneys are normal, without renal calculi, focal lesion, or hydronephrosis. Bladder is unremarkable. Stomach/Bowel: Dilated stomach and proximal duodenum to the level of the junction of the 2nd and 3rd portions of the duodenum. There is soft tissue thickening at that location, including the region of the ampulla. Minimal sigmoid colon diverticulosis.  No evidence of appendicitis. Vascular/Lymphatic: Extensive atheromatous arterial calcifications, including the origins of both renal arteries, superior mesenteric artery and celiac axis. Reproductive: Status post hysterectomy. No adnexal masses. Other: Small umbilical hernia  containing fat. Musculoskeletal: Lumbar and lower thoracic spine degenerative changes. Left hip total prosthesis. IMPRESSION: 1. Dilated stomach and proximal duodenum to the level of the junction of the 2nd and 3rd portions of the duodenum, possibly due to a duodenal mass at that location. The possible mass could also be obstructing the distal common duct at the ampulla. 2. Extensive calcific coronary artery and aortic atherosclerosis. Electronically Signed   By: Claudie Revering M.D.   On: 02/26/2021 14:48   DG Chest 2 View  Result Date: 02/26/2021 CLINICAL DATA:  Chest pain EXAM: CHEST - 2 VIEW COMPARISON:  12/18/2020 FINDINGS: Chronic interstitial prominence. No new consolidation or edema. Stable elevation of the right hemidiaphragm. No pleural effusion. No pneumothorax. Stable cardiomediastinal contours. Left chest wall dual lead pacemaker. Diffusely decreased osseous mineralization with poor visualization of vertebral bodies. Right shoulder arthroplasty. Left shoulder osteoarthritis. IMPRESSION: No acute process in the chest. Electronically Signed   By: Macy Mis M.D.   On: 02/26/2021 13:57   DG Abd 1 View  Result Date: 03/01/2021 CLINICAL DATA:  Check gastric catheter placement EXAM: ABDOMEN - 1 VIEW COMPARISON:  Film from earlier in the same day. FINDINGS: Gastric catheter is again noted within the stomach. Proximal side port lies near the gastroesophageal junction. This could be advanced a few more cm deeper into the stomach. IMPRESSION: Gastric catheter as described which could be advanced further into the stomach. Electronically Signed   By: Inez Catalina M.D.   On: 03/01/2021 18:39   CT CHEST ABDOMEN PELVIS W CONTRAST  Result Date: 02/26/2021 CLINICAL DATA:  Nausea and left-sided chest pain. EXAM: CT CHEST, ABDOMEN, AND PELVIS WITH CONTRAST TECHNIQUE: Multidetector CT imaging of the chest, abdomen and pelvis was performed following the standard protocol during bolus administration of intravenous  contrast. CONTRAST:  69mL OMNIPAQUE IOHEXOL 300 MG/ML  SOLN COMPARISON:  February 26, 2021 FINDINGS: CT CHEST FINDINGS Cardiovascular: There is marked severity calcification of the thoracic aorta. There is mild cardiomegaly with marked severity coronary artery calcification. No pericardial effusion. Mediastinum/Nodes: No enlarged mediastinal, hilar, or axillary lymph nodes. Thyroid gland, trachea, and esophagus demonstrate no significant findings. Lungs/Pleura: Very mild atelectasis is seen within the right lower lobe. There is no evidence of acute infiltrate, pleural effusion or pneumothorax. Musculoskeletal: A right shoulder replacement is seen. Multilevel degenerative changes seen throughout the thoracic spine. CT ABDOMEN PELVIS FINDINGS Hepatobiliary: No focal liver abnormality is seen. Status post cholecystectomy. The common bile duct is dilated (1.3 cm). Pancreas: Unremarkable. No pancreatic ductal dilatation or surrounding inflammatory changes. Spleen: Normal in size without focal abnormality. Adrenals/Urinary Tract: Adrenal glands are unremarkable. Kidneys are normal, without renal calculi, focal lesion, or hydronephrosis. The urinary bladder is limited in evaluation secondary to overlying streak artifact. Stomach/Bowel: Stomach is within normal limits. Appendix appears normal. Persistent dilatation of the proximal duodenum is seen to the level of the junction of  the second and third portions. Persistent soft tissue thickening is also seen at this level. Noninflamed diverticula are seen within the sigmoid colon. Vascular/Lymphatic: Aortic atherosclerosis. No enlarged abdominal or pelvic lymph nodes. Reproductive: Status post hysterectomy. No adnexal masses. Other: No abdominal wall hernia or abnormality. No abdominopelvic ascites. Musculoskeletal: Multilevel degenerative changes are noted throughout the lumbar spine. IMPRESSION: 1. No acute cardiopulmonary disease. 2. Persistent dilatation of the proximal  duodenum which may represent sequelae associated with a duodenal mass. Further evaluation with a GI consult and subsequent endoscopy is recommended. 3. Sigmoid diverticulosis. Electronically Signed   By: Virgina Norfolk M.D.   On: 02/26/2021 16:35   DG Abd 2 Views  Result Date: 02/28/2021 CLINICAL DATA:  Gastric outlet obstruction, nausea, vomiting EXAM: ABDOMEN - 2 VIEW COMPARISON:  02/27/2021 FINDINGS: Gaseous distention of the stomach. Remainder bowel decompressed. No organomegaly or free air. Aortic calcifications. No visible aneurysm. Cardiomegaly. No confluent opacity in the visualized lungs. IMPRESSION: Gaseous distention of the stomach. Electronically Signed   By: Rolm Baptise M.D.   On: 02/28/2021 09:51   DG Abd Portable 1V  Result Date: 02/27/2021 CLINICAL DATA:  Abdominal distension EXAM: PORTABLE ABDOMEN - 1 VIEW COMPARISON:  Portable exam 0902 hours without priors for comparison FINDINGS: Contrast material within kidneys, renal collecting systems, and bladder. Nonobstructive bowel gas pattern. No bowel dilatation or bowel wall thickening. Osseous demineralization with multilevel degenerative disc and facet disease changes of thoracolumbar spine. Atherosclerotic calcification aorta. LEFT hip prosthesis. IMPRESSION: Nonobstructive bowel gas pattern. Aortic Atherosclerosis (ICD10-I70.0). Electronically Signed   By: Lavonia Dana M.D.   On: 02/27/2021 11:00   DG Abd Portable 2V  Result Date: 03/01/2021 CLINICAL DATA:  Constipation EXAM: PORTABLE ABDOMEN - 2 VIEW COMPARISON:  February 28, 2021. FINDINGS: Supine and upright images obtained. Nasogastric tube tip and side port in stomach. There is moderate air in stomach. There is moderate stool in colon. There is no bowel dilatation or air-fluid level to suggest bowel obstruction. No evident free air. Status post total hip replacement on the left. Pacemaker leads attached to right atrium and right ventricle. There is aortic atherosclerosis. There is mild  bibasilar lung atelectasis. IMPRESSION: Nasogastric tube tip and side port in stomach. No bowel obstruction or free air evident. Moderate air in stomach. Bibasilar atelectasis.  Aortic Atherosclerosis (ICD10-I70.0). Electronically Signed   By: Lowella Grip III M.D.   On: 03/01/2021 09:05   DG [place] Dys Tube Plc W/Fl W/Rad  Result Date: 02/28/2021 CLINICAL DATA:  NG tube placement. EXAM: NASO G TUBE PLACEMENT WITH FL AND WITH RAD CONTRAST:  None. FLUOROSCOPY TIME:  Fluoroscopy Time:  0 minutes 48 seconds Radiation Exposure Index (if provided by the fluoroscopic device): 5.4 mGy Number of Acquired Spot Images: 2 COMPARISON:  Abdomen 02/28/2021. FINDINGS: NG tube successfully placed under fluoroscopic guidance. Tube tip and side hole placed are in the stomach. Gastric distention noted. Cardiac pacer noted. IMPRESSION: NG tube successfully placed under fluoroscopic guidance. Gastric distention noted. Electronically Signed   By: Marcello Moores  Register   On: 02/28/2021 08:48      Subjective: Has no abd pain, sob, n/v  Discharge Exam: Vitals:   03/02/21 0500 03/02/21 0730  BP: 134/75 134/66  Pulse: 95 93  Resp: (!) 21 17  Temp: 98.1 F (36.7 C) 97.9 F (36.6 C)  SpO2: 96% 98%   Vitals:   03/02/21 0000 03/02/21 0439 03/02/21 0500 03/02/21 0730  BP: (!) 135/49  134/75 134/66  Pulse: 98  95 93  Resp: 17  (!) 21 17  Temp: 98 F (36.7 C) 98.1 F (36.7 C) 98.1 F (36.7 C) 97.9 F (36.6 C)  TempSrc: Oral Oral Oral Oral  SpO2: 95%  96% 98%  Weight:      Height:        General: Pt is alert, awake, not in acute distress, NGT in place daughter at bedside Cardiovascular: RRR, S1/S2 +, no rubs, no gallops Respiratory: CTA bilaterally, no wheezing, no rhonchi Abdominal: Soft, NT, ND, bowel sounds + Extremities: no edema    The results of significant diagnostics from this hospitalization (including imaging, microbiology, ancillary and laboratory) are listed below for reference.      Microbiology: Recent Results (from the past 240 hour(s))  SARS CORONAVIRUS 2 (TAT 6-24 HRS) Nasopharyngeal Nasopharyngeal Swab     Status: None   Collection Time: 02/26/21  2:32 PM   Specimen: Nasopharyngeal Swab  Result Value Ref Range Status   SARS Coronavirus 2 NEGATIVE NEGATIVE Final    Comment: (NOTE) SARS-CoV-2 target nucleic acids are NOT DETECTED.  The SARS-CoV-2 RNA is generally detectable in upper and lower respiratory specimens during the acute phase of infection. Negative results do not preclude SARS-CoV-2 infection, do not rule out co-infections with other pathogens, and should not be used as the sole basis for treatment or other patient management decisions. Negative results must be combined with clinical observations, patient history, and epidemiological information. The expected result is Negative.  Fact Sheet for Patients: SugarRoll.be  Fact Sheet for Healthcare Providers: https://www.woods-mathews.com/  This test is not yet approved or cleared by the [place] FDA and  has been authorized for detection and/or diagnosis of SARS-CoV-2 by FDA under an Emergency Use Authorization (EUA). This EUA will remain  in effect (meaning this test can be used) for the duration of the COVID-19 declaration under Se ction 564(b)(1) of the Act, 21 U.S.C. section 360bbb-3(b)(1), unless the authorization is terminated or revoked sooner.  Performed at [facility], [place] [address] [place], [place] [address]      Labs: BNP (last 3 results) Recent Labs    12/18/20 1257 02/26/21 1305  BNP 493.6* 656.8*   Basic Metabolic Panel: Recent Labs  Lab 02/26/21 1305 02/27/21 0429 02/28/21 0404 03/01/21 0500  NA 138 140 143 146*  K 4.8 4.5 4.3 3.9  CL 105 102 103 103  CO2 24 26 29 29   GLUCOSE 107* 119* 95 79  BUN 37* 35* 38* 43*  CREATININE 1.33* 1.46* 1.52* 1.51*  CALCIUM 9.8 9.8 9.9 9.7  MG  --  2.2 2.2  --   PHOS   --   --  4.9*  --    Liver Function Tests: Recent Labs  Lab 02/26/21 1305 02/27/21 0429 02/28/21 0404 03/01/21 0500  AST 28  --  27 28  ALT 16  --  15 16  ALKPHOS 75  --  75 59  BILITOT 1.4* 1.3* 1.8*  1.8* 2.1*  PROT 7.2  --  7.4 6.9  ALBUMIN 4.0  --  4.2 3.7   Recent Labs  Lab 02/26/21 1305  LIPASE 44   No results for input(s): AMMONIA in the last 168 hours. CBC: Recent Labs  Lab 02/26/21 1305 02/27/21 0429 02/28/21 0404 03/01/21 0500  WBC 6.4 7.7 9.0 8.7  HGB 13.7 13.6 14.2 14.4  HCT 42.5 40.4 43.0 43.8  MCV 95.7 93.1 95.8 96.5  PLT 185 178 169 162   Cardiac Enzymes: No results for input(s): CKTOTAL, CKMB,  CKMBINDEX, TROPONINI in the last 168 hours. BNP: Invalid input(s): POCBNP CBG: Recent Labs  Lab 02/27/21 0755 02/28/21 0725 03/01/21 0743 03/02/21 0751  GLUCAP 120* 93 77 132*   D-Dimer No results for input(s): DDIMER in the last 72 hours. Hgb A1c No results for input(s): HGBA1C in the last 72 hours. Lipid Profile No results for input(s): CHOL, HDL, LDLCALC, TRIG, CHOLHDL, LDLDIRECT in the last 72 hours. Thyroid function studies No results for input(s): TSH, T4TOTAL, T3FREE, THYROIDAB in the last 72 hours.  Invalid input(s): FREET3 Anemia work up No results for input(s): VITAMINB12, FOLATE, FERRITIN, TIBC, IRON, RETICCTPCT in the last 72 hours. Urinalysis    Component Value Date/Time   COLORURINE YELLOW (A) 02/26/2021 1651   APPEARANCEUR HAZY (A) 02/26/2021 1651   LABSPEC 1.040 (H) 02/26/2021 1651   PHURINE 5.0 02/26/2021 1651   GLUCOSEU NEGATIVE 02/26/2021 1651   HGBUR NEGATIVE 02/26/2021 1651   BILIRUBINUR NEGATIVE 02/26/2021 1651   KETONESUR NEGATIVE 02/26/2021 1651   PROTEINUR NEGATIVE 02/26/2021 1651   NITRITE NEGATIVE 02/26/2021 1651   LEUKOCYTESUR TRACE (A) 02/26/2021 1651   Sepsis Labs Invalid input(s): PROCALCITONIN,  WBC,  LACTICIDVEN Microbiology Recent Results (from the past 240 hour(s))  SARS CORONAVIRUS 2 (TAT 6-24  HRS) Nasopharyngeal Nasopharyngeal Swab     Status: None   Collection Time: 02/26/21  2:32 PM   Specimen: Nasopharyngeal Swab  Result Value Ref Range Status   SARS Coronavirus 2 NEGATIVE NEGATIVE Final    Comment: (NOTE) SARS-CoV-2 target nucleic acids are NOT DETECTED.  The SARS-CoV-2 RNA is generally detectable in upper and lower respiratory specimens during the acute phase of infection. Negative results do not preclude SARS-CoV-2 infection, do not rule out co-infections with other pathogens, and should not be used as the sole basis for treatment or other patient management decisions. Negative results must be combined with clinical observations, patient history, and epidemiological information. The expected result is Negative.  Fact Sheet for Patients: SugarRoll.be  Fact Sheet for Healthcare Providers: https://www.woods-mathews.com/  This test is not yet approved or cleared by the [place] FDA and  has been authorized for detection and/or diagnosis of SARS-CoV-2 by FDA under an Emergency Use Authorization (EUA). This EUA will remain  in effect (meaning this test can be used) for the duration of the COVID-19 declaration under Se ction 564(b)(1) of the Act, 21 U.S.C. section 360bbb-3(b)(1), unless the authorization is terminated or revoked sooner.  Performed at [facility], [place] [address], [place], [place] [address]      Time coordinating discharge: Over 30 minutes  SIGNED:   Nolberto Hanlon, MD  [facility] 03/02/2021, 8:55 AM Pager   If 7PM-7AM, please contact night-coverage [email or web address] Password [email or web address]

## 2021-03-02 NOTE — H&P (Addendum)
History and Physical    [name] [name] JQZ:[id number] DOB: 03-31-1927 DOA: 03/02/2021  PCP: Tracie Harrier, MD  Patient coming from: Transfer from [facility]/Home  I have personally briefly reviewed patient's old medical records in [place]  Chief Complaint:   HPI: [name] [name] is a 87 y.o. female with medical history significant of essential hypertension, COPD, CKD stage IIIa, SSS s/p PPM, paroxysmal atrial fibrillation on Coumadin, CAD, chronic diastolic congestive heart failure, hyperlipidemia, GERD, peripheral vascular disease, distant history of breast cancer who presents as a transfer from [facility] for partial gastric outlet obstruction and duodenal mass.  HPI obtained from patient and patient's daughter who is present at bedside.  Apparently, patient was experiencing nausea/vomiting and abdominal pain with associated distention.  Symptoms apparently progressed over 2-week, and subsequently sought care at [facility] ED on 02/26/2021.  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  NG tube was placed to low wall intermittent suction.  Gastroenterology and general surgery were consulted and patient underwent EGD with findings of thickened folds mucosa in the antrum, extrinsic deformity second portion of duodenum s/p biopsy, no obvious mass.  General surgery recommended potential surgical options with biliary stent versus gastrojejunostomy. GI recommending further evaluation with MRI/MRCP, given patient with pacemaker present, GI recommended transfer to [facility] where MRI can be performed safely with pacemaker in place.    Review of Systems:   Constitutional - No Fatigue, No Weight Loss Vision - No impaired vision, decreased visual acuity Ear/Nose/Mouth/Throat - No decreased hearing, no congestion Respiratory -  No shortness of breath, no exertional dyspnea, chronic cough Cardiovascular - No chest pain, no palpitations, no peripheral edema Gastrointestinal - No current nausea, no diarrhea, no constipation, + mild RUQ abd pain and distention Genitourinary - No excessive urination, no urinary incontinence Integumentary - No rashes or concerning skin lesions Neurologic - No numbness, no tingling, no dizziness, no headaches, no confusion or memory loss   Past Medical History:  Diagnosis Date   Anemia    Arrhythmia    atrial fibrillation   Asthma    Breast cancer ([place]) 1978   left   CAD (coronary artery disease)    CHF (congestive heart failure) (HCC)    COPD (chronic obstructive pulmonary disease) (HCC)    DVT (deep venous thrombosis) (HCC)    Esophageal stricture    Hemorrhoids    History of knee replacement, total    Hyperlipidemia    Hypertension    Ischemic cardiomyopathy    Osteoporosis    Paroxysmal atrial fibrillation (HCC)    Peripheral arterial disease (HCC)    Sick sinus syndrome ([place])     Past Surgical History:  Procedure Laterality Date   ABDOMINAL HYSTERECTOMY  1985   HEMORRHOID SURGERY     ILIAC ARTERY STENT     INTRAOPERATIVE ARTERIOGRAM Right    MASTECTOMY Left 1978   PACEMAKER INSERTION Left 01/11/2020   Procedure: PACEMAKER GENERATOR CHANGE OUT;  Surgeon: Isaias Cowman, MD;  Location: [facility] ORS;  Service: Cardiovascular;  Laterality: Left;   REDUCTION MAMMAPLASTY Right 1985   impant placed then removed     reports that she quit smoking about 34 years ago. Her smoking use included cigarettes. She has never used smokeless tobacco. She reports that she does not drink alcohol and does not use drugs.  Allergies  Allergen  Reactions   Penicillins Anaphylaxis   Amoxicillin    Beta Adrenergic Blockers    Meloxicam     Other reaction(s): Unknown   Naproxen     Other reaction(s): Unknown   Nsaids     Other reaction(s): Unknown    Family History  Problem  Relation Age of Onset   Hypertension Father    Heart disease Father    Clotting disorder Father    Cancer Mother        breast   Heart disease Sister    Thyroid disease Sister    Diabetes Sister    Breast cancer Daughter        64's, twice diagnosed, double mastectomy   Breast cancer Daughter        20's    Family history reviewed and not pertinent   Prior to Admission medications   Medication Sig Start Date End Date Taking? Authorizing Provider  acetaminophen (TYLENOL) 160 MG/5ML solution Take 20.3 mLs (650 mg total) by mouth every 6 (six) hours as needed for mild pain. 03/02/21   Nolberto Hanlon, MD  albuterol (VENTOLIN HFA) 108 (90 Base) MCG/ACT inhaler Inhale 2 puffs into the lungs every 4 (four) hours as needed for wheezing or shortness of breath. 03/02/21   Nolberto Hanlon, MD  ALPRAZolam Duanne Moron) 0.25 MG tablet Take 1 tablet (0.25 mg total) by mouth 3 (three) times daily as needed for anxiety. 03/02/21   Nolberto Hanlon, MD  dilTIAZem HCl-Dextrose 125-5 MG/125ML-% SOLN infusion Inject 5-15 mg/hr into the vein continuous. 03/02/21   Nolberto Hanlon, MD  ipratropium-albuterol (DUONEB) 0.5-2.5 (3) MG/3ML SOLN Take 3 mLs by nebulization every 6 (six) hours as needed. 03/02/21   Nolberto Hanlon, MD  lactated ringers infusion Inject 50 mLs into the vein continuous. 03/02/21   Nolberto Hanlon, MD  mometasone-formoterol (DULERA) 200-5 MCG/ACT AERO Inhale 2 puffs into the lungs 2 (two) times daily. 03/02/21   Nolberto Hanlon, MD  morphine 2 MG/ML injection Inject 0.25 mLs (0.5 mg total) into the vein every 3 (three) hours as needed. 03/02/21   Nolberto Hanlon, MD  nitroGLYCERIN (NITROSTAT) 0.4 MG SL tablet Place 1 tablet (0.4 mg total) under the tongue as directed. 03/02/21   Nolberto Hanlon, MD    Physical Exam: Vitals:   03/02/21 1609  BP: (!) 156/85  Pulse: (!) 114  Resp: 19  Temp: 98.4 F (36.9 C)  TempSrc: Oral  SpO2: 95%    Constitutional: NAD, calm, comfortable Eyes: PERRL, lids and conjunctivae  normal ENMT: Mucous membranes are moist. Posterior pharynx clear of any exudate or lesions.Normal dentition.  NG tube noted in place to low wall suction with bilious drainage Neck: normal, supple, no masses, no thyromegaly Respiratory: clear to auscultation bilaterally, no wheezing, no crackles. Normal respiratory effort. No accessory muscle use.  On 2 L nasal cannula with SPO2 100% Cardiovascular: Regular rate and rhythm, no murmurs / rubs / gallops. No extremity edema. 2+ pedal pulses. No carotid bruits.  Abdomen: Mild right upper quadrant tenderness, no masses palpated. No hepatosplenomegaly.  Faint bowel sounds. Musculoskeletal: no clubbing / cyanosis. No joint deformity upper and lower extremities. Good ROM, no contractures. Normal muscle tone.  Skin: no rashes, lesions, ulcers. No induration Neurologic: CN 2-12 grossly intact. Sensation intact, DTR normal. Strength 5/5 in all 4.  Psychiatric: Normal judgment and insight. Alert and oriented x 3. Normal mood.    Labs on Admission: I have personally reviewed following labs and imaging studies  CBC: Recent Labs  Lab 02/26/21  1305 02/27/21 0429 02/28/21 0404 03/01/21 0500 03/02/21 1748  WBC 6.4 7.7 9.0 8.7 9.9  HGB 13.7 13.6 14.2 14.4 13.8  HCT 42.5 40.4 43.0 43.8 41.8  MCV 95.7 93.1 95.8 96.5 95.4  PLT 185 178 169 162 161   Basic Metabolic Panel: Recent Labs  Lab 02/26/21 1305 02/27/21 0429 02/28/21 0404 03/01/21 0500 03/02/21 1748  NA 138 140 143 146* 143  K 4.8 4.5 4.3 3.9 3.5  CL 105 102 103 103 103  CO2 24 26 29 29 29   GLUCOSE 107* 119* 95 79 130*  BUN 37* 35* 38* 43* 35*  CREATININE 1.33* 1.46* 1.52* 1.51* 1.42*  CALCIUM 9.8 9.8 9.9 9.7 9.0  MG  --  2.2 2.2  --   --   PHOS  --   --  4.9*  --   --    GFR: Estimated Creatinine Clearance: 25.4 mL/min (A) (by C-G formula based on SCr of 1.42 mg/dL (H)). Liver Function Tests: Recent Labs  Lab 02/26/21 1305 02/27/21 0429 02/28/21 0404 03/01/21 0500  03/02/21 1748  AST 28  --  27 28 28   ALT 16  --  15 16 13   ALKPHOS 75  --  75 59 61  BILITOT 1.4* 1.3* 1.8*  1.8* 2.1* 2.2*  PROT 7.2  --  7.4 6.9 5.9*  ALBUMIN 4.0  --  4.2 3.7 3.0*   Recent Labs  Lab 02/26/21 1305  LIPASE 44   No results for input(s): AMMONIA in the last 168 hours. Coagulation Profile: Recent Labs  Lab 02/27/21 0429 02/28/21 0404 03/01/21 0500 03/02/21 0514 03/02/21 1748  INR 2.4* 2.3* 1.5* 1.4* 1.6*   Cardiac Enzymes: No results for input(s): CKTOTAL, CKMB, CKMBINDEX, TROPONINI in the last 168 hours. BNP (last 3 results) No results for input(s): PROBNP in the last 8760 hours. HbA1C: No results for input(s): HGBA1C in the last 72 hours. CBG: Recent Labs  Lab 02/27/21 0755 02/28/21 0725 03/01/21 0743 03/02/21 0751 03/02/21 1450  GLUCAP 120* 93 77 132* 93   Lipid Profile: No results for input(s): CHOL, HDL, LDLCALC, TRIG, CHOLHDL, LDLDIRECT in the last 72 hours. Thyroid Function Tests: No results for input(s): TSH, T4TOTAL, FREET4, T3FREE, THYROIDAB in the last 72 hours. Anemia Panel: No results for input(s): VITAMINB12, FOLATE, FERRITIN, TIBC, IRON, RETICCTPCT in the last 72 hours. Urine analysis:    Component Value Date/Time   COLORURINE YELLOW (A) 02/26/2021 1651   APPEARANCEUR HAZY (A) 02/26/2021 1651   LABSPEC 1.040 (H) 02/26/2021 1651   PHURINE 5.0 02/26/2021 1651   GLUCOSEU NEGATIVE 02/26/2021 1651   HGBUR NEGATIVE 02/26/2021 1651   BILIRUBINUR NEGATIVE 02/26/2021 1651   KETONESUR NEGATIVE 02/26/2021 1651   PROTEINUR NEGATIVE 02/26/2021 1651   NITRITE NEGATIVE 02/26/2021 1651   LEUKOCYTESUR TRACE (A) 02/26/2021 1651    Radiological Exams on Admission: DG Abd 1 View  Result Date: 03/01/2021 CLINICAL DATA:  Check gastric catheter placement EXAM: ABDOMEN - 1 VIEW COMPARISON:  Film from earlier in the same day. FINDINGS: Gastric catheter is again noted within the stomach. Proximal side port lies near the gastroesophageal junction.  This could be advanced a few more cm deeper into the stomach. IMPRESSION: Gastric catheter as described which could be advanced further into the stomach. Electronically Signed   By: Inez Catalina M.D.   On: 03/01/2021 18:39   DG Abd Portable 2V  Result Date: 03/01/2021 CLINICAL DATA:  Constipation EXAM: PORTABLE ABDOMEN - 2 VIEW COMPARISON:  February 28, 2021. FINDINGS: Supine and  upright images obtained. Nasogastric tube tip and side port in stomach. There is moderate air in stomach. There is moderate stool in colon. There is no bowel dilatation or air-fluid level to suggest bowel obstruction. No evident free air. Status post total hip replacement on the left. Pacemaker leads attached to right atrium and right ventricle. There is aortic atherosclerosis. There is mild bibasilar lung atelectasis. IMPRESSION: Nasogastric tube tip and side port in stomach. No bowel obstruction or free air evident. Moderate air in stomach. Bibasilar atelectasis.  Aortic Atherosclerosis (ICD10-I70.0). Electronically Signed   By: Lowella Grip III M.D.   On: 03/01/2021 09:05    EKG: Independently reviewed.   Assessment/Plan Principal Problem:   Partial gastric outlet obstruction Active Problems:   Essential (primary) hypertension   Pacemaker   Atrial fibrillation with RVR (HCC)   COPD (chronic obstructive pulmonary disease) (HCC)   Abdominal pain   CKD (chronic kidney disease), stage IIIa   Duodenal mass    Partial gastric outlet obstruction Biliary obstruction Ampullary vs duodenal mass Patient presenting as a transfer from [facility] with 2-week history of progressive nausea/vomiting and abdominal pain with associated distention.  Total bilirubin elevated 2.1.  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 EGD with findings of thickened folds mucosa in the antrum, extrinsic  deformity second portion of duodenum s/p biopsy, no obvious mass.  GI and general surgery recommending MRI/MRCP, which cannot be formed at [facility] apparently. --MRI tech states confirmed with [employer] her pacemaker leads placed in 2009 are not compatible with MRI; so unable to obtain MRI/MRCP --N.p.o., okay for ice chips --NG tube to low wall intermittent suction --D5 half NS at 50 mL/hr per hour --GI consulted for evaluation of ERCP/EUS and biliary stent given patient's pacemaker leads not compatible with MRI --General surgery evaluation for consideration of gastrojejunostomy --CMP daily  Atrial fibrillation with RVR Prior to EGD at [facility], patient's heart rate became elevated.  Given her n.p.o. status, she was placed on a Cardizem drip.  Home medication includes warfarin and metoprolol succinate 50 mg p.o. daily. --Cardizem drip --Metoprolol 5 mg IV every 6 hours; hold for HR <60 or SBP <100 --Holding warfarin for possible need of surgical/endoscopic intervention --Monitor on telemetry  COPD On Symbicort, Singulair, Spiriva and albuterol MDI as needed at home. --Dulera 2 puffs twice daily as hospital substitution --Incruse Ellipta 1 puff daily as hospital substitution --DuoNebs every 6 hours as needed for shortness of breath/wheezing  Chronic diastolic congestive heart failure, compensated On metoprolol succinate 50 mg p.o. daily and furosemide 40 mg p.o. daily at baseline. --Holding oral medications while n.p.o. --Strict I's and O's Daily weights  SSS s/p PPM Patient underwent recent pacemaker generator change on 01/11/2020.  Current generator is listed as aPACE AZURE XT DR MRI, her pacemaker leads were placed in 2009 which were not MRI compatible, per MRI tech. --Continue monitor on telemetry  GERD: Holding home PPI while n.p.o.  CKD stage IIIa: Creatinine baseline 1.3.  --Cr 1.46>1.52>1.51 --Avoid nephrotoxins, renally dose all medications --Monitor function closely  daily   DVT prophylaxis: SCDs Code Status: DNR Family Communication: Updated daughter present at bedside Disposition Plan: Anticipate discharge home when medically stable Consults called: Epic message sent to General Surgery and [facility] Admission status: Inpatient Level of care: Telemetry Medical   Severity of Illness: The appropriate patient status for this patient  is INPATIENT. Inpatient status is judged to be reasonable and necessary in order to provide the required intensity of service to ensure the patient's safety. The patient's presenting symptoms, physical exam findings, and initial radiographic and laboratory data in the context of their chronic comorbidities is felt to place them at high risk for further clinical deterioration. Furthermore, it is not anticipated that the patient will be medically stable for discharge from the hospital within 2 midnights of admission. The following factors support the patient status of inpatient.   " The patient's presenting symptoms include nausea/vomiting, abdominal pain with distention " The worrisome physical exam findings include abdominal distention " The initial radiographic and laboratory data are worrisome because of CT abdomen/pelvis with gastric outlet obstruction, partial with questionable duodenal mass with obstruction of the distal common bile duct on CT scan elevated bilirubin " The chronic co-morbidities include HTN, COPD, CKD stage IIIa, SSS s/p PPM, CAD, diastolic congestive heart failure, HLD, GERD, peripheral vascular disease, history of breast cancer   * I certify that at the point of admission it is my clinical judgment that the patient will require inpatient hospital care spanning beyond 2 midnights from the point of admission due to high intensity of service, high risk for further deterioration and high frequency of surveillance required.*    Kristell Wooding J [place] DO [facility] Available via Epic secure chat 7am-7pm After  these hours, please refer to coverage provider listed on [email or web address] 03/02/2021, 7:11 PM

## 2021-03-02 NOTE — Progress Notes (Signed)
Pt had a Pacemaker revision in 2021. Her original Adapta (ADDR01) Pacer was placed in 10/26/2007. During her revision, the original leads were used and are not MR compatible. The lead information was gathered from [place]. She has model [id number] in the right ventricle and 5592-45 in the right atrium. Dr. [place] has been made aware. OK to cancel order.

## 2021-03-03 ENCOUNTER — Inpatient Hospital Stay (HOSPITAL_COMMUNITY): Payer: PPO

## 2021-03-03 ENCOUNTER — Inpatient Hospital Stay: Payer: Self-pay

## 2021-03-03 DIAGNOSIS — K319 Disease of stomach and duodenum, unspecified: Secondary | ICD-10-CM

## 2021-03-03 DIAGNOSIS — R112 Nausea with vomiting, unspecified: Secondary | ICD-10-CM

## 2021-03-03 LAB — COMPREHENSIVE METABOLIC PANEL
ALT: 10 U/L (ref 0–44)
AST: 26 U/L (ref 15–41)
Albumin: 2.8 g/dL — ABNORMAL LOW (ref 3.5–5.0)
Alkaline Phosphatase: 58 U/L (ref 38–126)
Anion gap: 14 (ref 5–15)
BUN: 34 mg/dL — ABNORMAL HIGH (ref 8–23)
CO2: 25 mmol/L (ref 22–32)
Calcium: 9.1 mg/dL (ref 8.9–10.3)
Chloride: 105 mmol/L (ref 98–111)
Creatinine, Ser: 1.35 mg/dL — ABNORMAL HIGH (ref 0.44–1.00)
GFR, Estimated: 37 mL/min — ABNORMAL LOW (ref >60)
Glucose, Bld: 121 mg/dL — ABNORMAL HIGH (ref 70–99)
Potassium: 3.6 mmol/L (ref 3.5–5.1)
Sodium: 144 mmol/L (ref 135–145)
Total Bilirubin: 1.8 mg/dL — ABNORMAL HIGH (ref 0.3–1.2)
Total Protein: 5.4 g/dL — ABNORMAL LOW (ref 6.5–8.1)

## 2021-03-03 LAB — PHOSPHORUS: Phosphorus: 3.1 mg/dL (ref 2.5–4.6)

## 2021-03-03 LAB — GLUCOSE, CAPILLARY
Glucose-Capillary: 140 mg/dL — ABNORMAL HIGH (ref 70–99)
Glucose-Capillary: 124 mg/dL — ABNORMAL HIGH (ref 70–99)
Glucose-Capillary: 121 mg/dL — ABNORMAL HIGH (ref 70–99)

## 2021-03-03 LAB — MAGNESIUM: Magnesium: 2 mg/dL (ref 1.7–2.4)

## 2021-03-03 LAB — PREALBUMIN: Prealbumin: 10.6 mg/dL — ABNORMAL LOW (ref 18–38)

## 2021-03-03 LAB — TRIGLYCERIDES: Triglycerides: 100 mg/dL (ref <150)

## 2021-03-03 MED ORDER — CHLORHEXIDINE GLUCONATE CLOTH 2 % EX PADS
6.0000 | MEDICATED_PAD | Freq: Every day | CUTANEOUS | Status: DC
  Administered 2021-03-03 – 2021-03-09 (×7): 6 via TOPICAL

## 2021-03-03 MED ORDER — POTASSIUM CHLORIDE 10 MEQ/100ML IV SOLN
10.0000 meq | INTRAVENOUS | Status: AC
  Administered 2021-03-03 (×4): 10 meq via INTRAVENOUS
  Filled 2021-03-03 (×4): qty 100

## 2021-03-03 MED ORDER — SODIUM CHLORIDE 0.9% FLUSH
10.0000 mL | Freq: Two times a day (BID) | INTRAVENOUS | Status: DC
  Administered 2021-03-03 – 2021-03-04 (×4): 10 mL
  Administered 2021-03-05: 20 mL
  Administered 2021-03-05 – 2021-03-06 (×2): 10 mL
  Administered 2021-03-06: 30 mL
  Administered 2021-03-07: 10 mL
  Administered 2021-03-07: 30 mL
  Administered 2021-03-08 (×2): 10 mL
  Administered 2021-03-09: 20 mL

## 2021-03-03 MED ORDER — SODIUM CHLORIDE 0.9% FLUSH
10.0000 mL | INTRAVENOUS | Status: DC | PRN
  Administered 2021-03-08: 10 mL

## 2021-03-03 MED ORDER — INSULIN ASPART 100 UNIT/ML IJ SOLN
0.0000 [IU] | Freq: Four times a day (QID) | INTRAMUSCULAR | Status: DC
  Administered 2021-03-04: 1 [IU] via SUBCUTANEOUS
  Administered 2021-03-04 (×2): 2 [IU] via SUBCUTANEOUS
  Administered 2021-03-04 – 2021-03-05 (×3): 1 [IU] via SUBCUTANEOUS

## 2021-03-03 MED ORDER — TRAVASOL 10 % IV SOLN
INTRAVENOUS | Status: AC
  Filled 2021-03-03: qty 374.4

## 2021-03-03 MED ORDER — ACETAMINOPHEN 325 MG PO TABS
650.0000 mg | ORAL_TABLET | Freq: Four times a day (QID) | ORAL | Status: DC | PRN
  Filled 2021-03-03: qty 2

## 2021-03-03 MED ORDER — PANTOPRAZOLE SODIUM 40 MG IV SOLR
40.0000 mg | INTRAVENOUS | Status: DC
  Administered 2021-03-03 – 2021-03-04 (×2): 40 mg via INTRAVENOUS
  Filled 2021-03-03 (×2): qty 40

## 2021-03-03 NOTE — Progress Notes (Signed)
Peripherally Inserted Central Catheter Placement  The IV Nurse has discussed with the patient and/or persons authorized to consent for the patient, the purpose of this procedure and the potential benefits and risks involved with this procedure.  The benefits include less needle sticks, lab draws from the catheter, and the patient may be discharged home with the catheter. Risks include, but not limited to, infection, bleeding, blood clot (thrombus formation), and puncture of an artery; nerve damage and irregular heartbeat and possibility to perform a PICC exchange if needed/ordered by physician.  Alternatives to this procedure were also discussed.  Bard Power PICC patient education guide, fact sheet on infection prevention and patient information card has been provided to patient /or left at bedside.    Consent obtained with daughter at bedside  PICC Placement Documentation  PICC Triple Lumen 03/03/21 PICC Right Brachial 38 cm 0 cm (Active)  Indication for Insertion or Continuance of Line Administration of hyperosmolar/irritating solutions (i.e. TPN, Vancomycin, etc.) 03/03/21 1100  Exposed Catheter (cm) 0 cm 03/03/21 1100  Site Assessment Clean;Dry;Intact 03/03/21 1100  Lumen #1 Status Flushed;Saline locked;Blood return noted 03/03/21 1100  Lumen #2 Status Flushed;Saline locked;Blood return noted 03/03/21 1100  Lumen #3 Status Flushed;Saline locked;Blood return noted 03/03/21 1100  Dressing Type Transparent;Securing device 03/03/21 1100  Dressing Status Clean;Dry;Intact 03/03/21 1100  Antimicrobial disc in place? Yes 03/03/21 1100  Safety Lock Not Applicable 14/43/15 4008  Line Care Connections checked and tightened 03/03/21 1100  Dressing Intervention New dressing 03/03/21 1100  Dressing Change Due 03/10/21 03/03/21 [place], [place] 03/03/2021, 11:48 AM

## 2021-03-03 NOTE — Anesthesia Preprocedure Evaluation (Addendum)
Anesthesia Evaluation  Patient identified by MRN, date of birth, ID band Patient awake    Reviewed: Allergy & Precautions, NPO status , Patient's Chart, lab work & pertinent test results  Airway Mallampati: II  TM Distance: >3 FB Neck ROM: Full    Dental no notable dental hx. (+) Upper Dentures, Lower Dentures   Pulmonary COPD,  COPD inhaler, former smoker,  02/26/2021 SARS coronavirus NEG   Pulmonary exam normal breath sounds clear to auscultation       Cardiovascular hypertension, + Peripheral Vascular Disease, +CHF and + DVT  Normal cardiovascular exam+ dysrhythmias Atrial Fibrillation + pacemaker + Valvular Problems/Murmurs MR and AI  Rhythm:Regular Rate:Normal  05/2020 ECHO: normal LVF, normal RVF,  Aortic: MILD AR, No AS       175.3 cm/sec peak vel, 12.3 mmHg peak grad        7.4 mmHg mean grad     AVA 1.1 cm^2   Mitral: MILD MR, No MS  Tricuspid: MODERATE TR, No TS        329.8 cm/sec peak TR vel, 46.5 mmHg peak RV  Pulmonary: MILD PR, No PS     Neuro/Psych negative neurological ROS  negative psych ROS   GI/Hepatic negative GI ROS, Neg liver ROS, N/V with Gastric outlet obstruction:  duodenal mass at the junction of the second and third part of the duodenum causing dilation proximally and possibly obstructing the distal common bile duct   Endo/Other  negative endocrine ROS  Renal/GU Renal InsufficiencyRenal disease (creat 1.27)     Musculoskeletal negative musculoskeletal ROS (+)   Abdominal (+) + obese,   Peds  Hematology negative hematology ROS (+)   Anesthesia Other Findings   Reproductive/Obstetrics                          Anesthesia Physical Anesthesia Plan  ASA: 3  Anesthesia Plan: General   Post-op Pain Management:    Induction: Intravenous  PONV Risk Score and Plan: 3 and Ondansetron, Dexamethasone and Treatment may vary due to age or medical  condition  Airway Management Planned: Oral ETT  Additional Equipment: None  Intra-op Plan:   Post-operative Plan: Extubation in OR  Informed Consent: I have reviewed the patients History and Physical, chart, labs and discussed the procedure including the risks, benefits and alternatives for the proposed anesthesia with the patient or authorized representative who has indicated his/her understanding and acceptance.   Patient has DNR.  Discussed DNR with power of attorney and Suspend DNR.   Dental advisory given and Consent reviewed with POA  Plan Discussed with: CRNA and Surgeon  Anesthesia Plan Comments: (Pt somnolent, discussed DNR and intubation for aspiration protection with daughter, Mechele Claude, by telephone. She agrees, suspend DNR perioperatively for procedure under GETA)     Anesthesia Quick Evaluation

## 2021-03-03 NOTE — Consult Note (Addendum)
Referring Provider: Dr. Eric [place] Primary Care Physician:  Tracie Harrier, MD Primary Gastroenterologist: Dr. Bonna Gains / unassigned  Reason for Consultation: Partial gastric outlet obstruction  HPI: [name] [name] is a 87 y.o. female with a past medical history of asthma, COPD, hypertension, coronary artery disease, CHF, sick sinus syndrome status post pacemaker placement 01/11/2020, atrial fibrillation on Coumadin and  breast cancer.  Past cholecystectomy and hysterectomy.  She presented to [facility] ED on 02/26/2021 with nausea, vomiting and diarrhea.  She stated having urgent nonbloody loose stools for the past month.  She developed nausea with vomiting Monday night into Tuesday 6/7 am.  She describes her emesis as flakes of brown matter, coffee-ground type emesis.  No frank red hematemesis.  Labs in the ED showed a WBC 6.4. Hg 13.7. Lactic Acid 2.6. INR 2.6.  Total bili 1.4.  Lipase 44. CTAP without contrast showed a dilated stomach and proximal duodenum to the level of the junction of the second and third portions of the duodenum, possibly due to a duodenal mass. An NG tube  was placed. An EGD was done 03/01/2021 which showed gastric varices without bleeding, thickened folds in the antrum and a duodenal deformity was found no obvious mass was identified.   Biopsies obtained.  An abdominal MRI/MRCP was considered to rule out any biliary ductal abnormalities resulting in extrinsic compression, ever, she has a pacemaker which is not compatible with MRI imaging.  She had recurrent atrial fibrillation with heart rates in the 130s which required da Cardizem drip.  She was transferred to [facility] for further evaluation.  Currently, she denies having any nausea or further vomiting.  She is tolerating ice chips.  She denies having any upper or lower abdominal pain she has not passed any gas per the rectum today.  No bowel movement since 2 the hospital on 6/7.  No dysphagia or  heartburn.  No prior history of GI bleeding.  She reported undergoing multiple colonoscopies in the past which showed colon polyps.  Her last colonoscopy was about 20 years ago.  No known family history of esophageal, gastric or colon cancer.  EGD 03/01/2021: - Normal esophagus. - Gastric varices, without bleeding. - Thickened folds mucosa in the antrum. Biopsied. - Normal duodenal bulb. - Normal area of the papilla. - Duodenal deformity. Biopsied An extrinsic deformity was found in the second portion of the duodenum. Biopsies were taken with a cold forceps for histology. This area appeared to have thickened folds that were not able to be insufflated with air like usual. There was no resistance to passing the endoscope through this area and the lumen distal to this area was normal and insufflated well. There was no obvious mass in this area. The biopsies were done well away from the papilla as marked in the images.    CT CHEST, ABDOMEN, AND PELVIS WITH CONTRAST 02/26/2021:   TECHNIQUE: Multidetector CT imaging of the chest, abdomen and pelvis was performed following the standard protocol during bolus administration of intravenous contrast.   CONTRAST:  5m OMNIPAQUE IOHEXOL 300 MG/ML  SOLN   COMPARISON:  February 26, 2021   FINDINGS: CT CHEST FINDINGS   Cardiovascular: There is marked severity calcification of the thoracic aorta. There is mild cardiomegaly with marked severity coronary artery calcification. No pericardial effusion.   Mediastinum/Nodes: No enlarged mediastinal, hilar, or axillary lymph nodes. Thyroid gland, trachea, and esophagus demonstrate no significant findings.   Lungs/Pleura: Very mild atelectasis is seen within the right  lower lobe. There is no evidence of acute infiltrate, pleural effusion or pneumothorax.   Musculoskeletal: A right shoulder replacement is seen.   Multilevel degenerative changes seen throughout the thoracic spine.   CT ABDOMEN PELVIS  FINDINGS   Hepatobiliary: No focal liver abnormality is seen. Status post cholecystectomy. The common bile duct is dilated (1.3 cm).   Pancreas: Unremarkable. No pancreatic ductal dilatation or surrounding inflammatory changes.   Spleen: Normal in size without focal abnormality.   Adrenals/Urinary Tract: Adrenal glands are unremarkable. Kidneys are normal, without renal calculi, focal lesion, or hydronephrosis. The urinary bladder is limited in evaluation secondary to overlying streak artifact.   Stomach/Bowel: Stomach is within normal limits. Appendix appears normal. Persistent dilatation of the proximal duodenum is seen to the level of the junction of the second and third portions. Persistent soft tissue thickening is also seen at this level. Noninflamed diverticula are seen within the sigmoid colon.   Vascular/Lymphatic: Aortic atherosclerosis. No enlarged abdominal or pelvic lymph nodes.   Reproductive: Status post hysterectomy. No adnexal masses.   Other: No abdominal wall hernia or abnormality. No abdominopelvic ascites.   Musculoskeletal: Multilevel degenerative changes are noted throughout the lumbar spine.   IMPRESSION: 1. No acute cardiopulmonary disease. 2. Persistent dilatation of the proximal duodenum which may represent sequelae associated with a duodenal mass. Further evaluation with a GI consult and subsequent endoscopy is recommended. 3. Sigmoid diverticulosis.  Past Medical History:  Diagnosis Date   Anemia    Arrhythmia    atrial fibrillation   Asthma    Breast cancer ([place]) 1978   left   CAD (coronary artery disease)    CHF (congestive heart failure) (HCC)    COPD (chronic obstructive pulmonary disease) (HCC)    DVT (deep venous thrombosis) (HCC)    Esophageal stricture    Hemorrhoids    History of knee replacement, total    Hyperlipidemia    Hypertension    Ischemic cardiomyopathy    Osteoporosis    Paroxysmal atrial fibrillation (HCC)     Peripheral arterial disease (HCC)    Sick sinus syndrome ([place])     Past Surgical History:  Procedure Laterality Date   ABDOMINAL HYSTERECTOMY  1985   HEMORRHOID SURGERY     ILIAC ARTERY STENT     INTRAOPERATIVE ARTERIOGRAM Right    MASTECTOMY Left 1978   PACEMAKER INSERTION Left 01/11/2020   Procedure: PACEMAKER GENERATOR CHANGE OUT;  Surgeon: Isaias Cowman, MD;  Location: [facility] ORS;  Service: Cardiovascular;  Laterality: Left;   REDUCTION MAMMAPLASTY Right 1985   impant placed then removed    Prior to Admission medications   Medication Sig Start Date End Date Taking? Authorizing Provider  acetaminophen (TYLENOL) 160 MG/5ML solution Take 20.3 mLs (650 mg total) by mouth every 6 (six) hours as needed for mild pain. 03/02/21   Nolberto Hanlon, MD  albuterol (VENTOLIN HFA) 108 (90 Base) MCG/ACT inhaler Inhale 2 puffs into the lungs every 4 (four) hours as needed for wheezing or shortness of breath. 03/02/21   Nolberto Hanlon, MD  ALPRAZolam Duanne Moron) 0.25 MG tablet Take 1 tablet (0.25 mg total) by mouth 3 (three) times daily as needed for anxiety. 03/02/21   Nolberto Hanlon, MD  dilTIAZem HCl-Dextrose 125-5 MG/125ML-% SOLN infusion Inject 5-15 mg/hr into the vein continuous. 03/02/21   Nolberto Hanlon, MD  ipratropium-albuterol (DUONEB) 0.5-2.5 (3) MG/3ML SOLN Take 3 mLs by nebulization every 6 (six) hours as needed. 03/02/21   Nolberto Hanlon, MD  lactated ringers infusion Inject 50  mLs into the vein continuous. 03/02/21   Nolberto Hanlon, MD  mometasone-formoterol (DULERA) 200-5 MCG/ACT AERO Inhale 2 puffs into the lungs 2 (two) times daily. 03/02/21   Nolberto Hanlon, MD  morphine 2 MG/ML injection Inject 0.25 mLs (0.5 mg total) into the vein every 3 (three) hours as needed. 03/02/21   Nolberto Hanlon, MD  nitroGLYCERIN (NITROSTAT) 0.4 MG SL tablet Place 1 tablet (0.4 mg total) under the tongue as directed. 03/02/21   Nolberto Hanlon, MD    Current Facility-Administered Medications  Medication Dose Route Frequency  Provider Last Rate Last Admin   ALPRAZolam Duanne Moron) tablet 0.25 mg  0.25 mg Oral TID PRN Shela Leff, MD       dextrose 5 %-0.45 % sodium chloride infusion   Intravenous Continuous [place] Eric J, DO 50 mL/hr at 03/02/21 2032 New Bag at 03/02/21 2032   diltiazem (CARDIZEM) 125 mg in dextrose 5% 125 mL (1 mg/mL) infusion  5-15 mg/hr Intravenous Titrated [place] Eric J, DO 10 mL/hr at 03/03/21 0114 10 mg/hr at 03/03/21 0114   ipratropium-albuterol (DUONEB) 0.5-2.5 (3) MG/3ML nebulizer solution 3 mL  3 mL Nebulization Q6H PRN [place] Eric J, DO       metoprolol tartrate (LOPRESSOR) injection 5 mg  5 mg Intravenous Q6H [place] Donnamarie Poag, DO   5 mg at 03/02/21 2202   mometasone-formoterol (DULERA) 200-5 MCG/ACT inhaler 2 puff  2 puff Inhalation BID [place] Eric J, DO   2 puff at 03/03/21 0830   ondansetron (ZOFRAN) tablet 4 mg  4 mg Oral Q6H PRN [place] Donnamarie Poag, DO       Or   ondansetron [facility]) injection 4 mg  4 mg Intravenous Q6H PRN [place] Eric J, DO       umeclidinium bromide (INCRUSE ELLIPTA) 62.5 MCG/INH 1 puff  1 puff Inhalation Daily [place] Donnamarie Poag, DO        Allergies as of 03/02/2021 - Review Complete 03/02/2021  Allergen Reaction Noted   Penicillins Anaphylaxis 11/21/2014   Amoxicillin  08/29/2020   Beta adrenergic blockers  05/03/2020   Meloxicam  12/29/2013   Naproxen  12/29/2013   Nsaids  12/29/2013    Family History  Problem Relation Age of Onset   Hypertension Father    Heart disease Father    Clotting disorder Father    Cancer Mother        breast   Heart disease Sister    Thyroid disease Sister    Diabetes Sister    Breast cancer Daughter        18's, twice diagnosed, double mastectomy   Breast cancer Daughter        24's    Social History   Socioeconomic History   Marital status: Widowed    Spouse name: Not on file   Number of children: Not on file   Years of education: Not on file   Highest education level: Not on file  Occupational History    Not on file  Tobacco Use   Smoking status: Former    Pack years: 0.00    Types: Cigarettes    Quit date: 12/05/1986    Years since quitting: 34.2   Smokeless tobacco: Never  Vaping Use   Vaping Use: Never used  Substance and Sexual Activity   Alcohol use: No    Alcohol/week: 0.0 standard drinks   Drug use: No   Sexual activity: Not on file  Other Topics Concern   Not on file  Social History Narrative  Not on file   Social Determinants of Health   Financial Resource Strain: Not on file  Food Insecurity: Not on file  Transportation Needs: Not on file  Physical Activity: Not on file  Stress: Not on file  Social Connections: Not on file  Intimate Partner Violence: Not on file    Review of Systems: See HPI, all other systems reviewed and are negative  Physical Exam: Vital signs in last 24 hours: Temp:  [97.6 F (36.4 C)-99.3 F (37.4 C)] 99 F (37.2 C) (06/12 0800) Pulse Rate:  [60-114] 112 (06/12 0800) Resp:  [15-21] 21 (06/12 0800) BP: (96-156)/(31-91) 108/78 (06/12 0800) SpO2:  [93 %-100 %] 98 % (06/12 0833) Weight:  [77.9 kg] 77.9 kg (06/12 0345)   General:  Alert 87 year old female in no acute distress. Head:  Normocephalic and atraumatic. Eyes:  No scleral icterus. Conjunctiva pink. Ears:  Normal auditory acuity. Nose:  No deformity, discharge or lesions. Mouth: Upper dentures.  No ulcers or lesions.  Dry coating on tongue. Neck:  Supple. No lymphadenopathy or thyromegaly.  Lungs: Breath sounds clear with few bibasilar crackles. Heart: Regular rhythm, systolic murmur. Abdomen: Soft, nontender.  Hypoactive bowel sounds to all 4 quadrants when NG tube clamped clamped.  Approximately 300 cc of dark green bilious drainage in the NG tube container. Rectal: Deferred. Musculoskeletal:  Symmetrical without gross deformities.  Pulses:  Normal pulses noted. Extremities:  Without clubbing or edema. Neurologic:  Alert and  oriented x4. No focal deficits.  Skin:   Intact without significant lesions or rashes. Psych:  Alert and cooperative. Normal mood and affect.  Intake/Output from previous day: 06/11 0701 - 06/12 0700 In: 551.9 [I.V.:551.9] Out: 300 [Urine:300] Intake/Output this shift: No intake/output data recorded.  Lab Results: Recent Labs    03/01/21 0500 03/02/21 1748  WBC 8.7 9.9  HGB 14.4 13.8  HCT 43.8 41.8  PLT 162 161   BMET Recent Labs    03/01/21 0500 03/02/21 1748 03/03/21 0057  NA 146* 143 144  K 3.9 3.5 3.6  CL 103 103 105  CO2 '29 29 25  ' GLUCOSE 79 130* 121*  BUN 43* 35* 34*  CREATININE 1.51* 1.42* 1.35*  CALCIUM 9.7 9.0 9.1   LFT Recent Labs    03/01/21 0500 03/02/21 1748 03/03/21 0057  PROT 6.9   < > 5.4*  ALBUMIN 3.7   < > 2.8*  AST 28   < > 26  ALT 16   < > 10  ALKPHOS 59   < > 58  BILITOT 2.1*   < > 1.8*  BILIDIR 0.4*  --   --    < > = values in this interval not displayed.   PT/INR Recent Labs    03/02/21 0514 03/02/21 1748  LABPROT 17.5* 19.2*  INR 1.4* 1.6*   Hepatitis Panel No results for input(s): HEPBSAG, HCVAB, HEPAIGM, HEPBIGM in the last 72 hours.    Studies/Results: DG Abd 1 View  Result Date: 03/01/2021 CLINICAL DATA:  Check gastric catheter placement EXAM: ABDOMEN - 1 VIEW COMPARISON:  Film from earlier in the same day. FINDINGS: Gastric catheter is again noted within the stomach. Proximal side port lies near the gastroesophageal junction. This could be advanced a few more cm deeper into the stomach. IMPRESSION: Gastric catheter as described which could be advanced further into the stomach. Electronically Signed   By: Inez Catalina M.D.   On: 03/01/2021 18:39   [place] EKG SITE RITE  Result Date: 03/03/2021 If Site  Rite image not attached, placement could not be confirmed due to current cardiac rhythm.   IMPRESSION/PLAN:  74.  87 year old female with abdominal pain, N/V and elevated t. bili levels. CTAP 02/26/2021 dilatation of the proximal duodenum concerning for gastric  outlet obstruction, duodenal mass could not be excluded. Query ampullary vs duodenal mass. S/P EGD 03/01/2021 identified thickened folds to the antral mucosa and a duodenal deformity without evidence of a mass.  She seen by general surgery, conservative management recommended at this juncture. Labs 6/12 showed alk phos level 58.  AST 26.  ALT 10.  Total bili 2.2 -> 1.8. Indirect bili > direct (possible Gilbert's syndrome).  -Trend LFTs -Tentatively plan for repeat EGD  +/- EUS with Dr. Rush Landmark next 1 to 2 days, await further recommendations from Dr. Fuller Plan -NPO -Ondansetron 4 mg IV every 6 hours. -IV fluids per the hospitalist  -Pantoprazole 14m IV Q 24 hours  -General surgery following   2.  CAD. CHF, Atrial fibrillation. Coumadin on hold.  Cardizem drip on hold.  3.  Past pacemaker placement, not compatible for MRI imaging  4.  History of COPD  5.  Remote history of breast cancer   6.  History of colon polyps   CNoralyn Pick 03/03/2021, 9:41 AM     Attending Physician Note   I have taken a history, examined the patient and reviewed the chart. I agree with the Advanced Practitioner's note, impression and recommendations.  *Abdominal pain, N/V with duodenal dilation. EGD with descending duodenum deformity and an abnormal gastric antral fold - query varix. [facility] precludes MRI.   *CBD dilation S/P cholecystectomy without intrahepatic ductal dilation and with normal LFTs (except for a minimally elevated t bili which is primarily indirect c/w Gilbert's syndrome).  Continue NG tube to LIS  Continue pantoprazole 40 mg IV q12h General surgery following Consider repeat EGD with standard endoscope and with a duodenoscope Consider EUS - will review with Dr. MRush Landmark Dr. MRush Landmarkis covering the hospital service starting Monday and will develop further plans.   MLucio Edward MD FACG [phone]

## 2021-03-03 NOTE — Consult Note (Signed)
[facility] Surgery Consult Note  [name] [name] [birth year]  [id number].    Requesting MD: Eric [place] Chief Complaint/Reason for Consult: partial gastric outlet obstruction  HPI:  [name] [name] is a 87yo female PMH atrial fibrillation on coumadin, HTN, COPD, CKD, SSS s/p PPM, CHF who was transferred from [facility] to [facility] with partial gastric outlet obstruction and duodenal mass. Daughter at bedside. States that she developed abdominal pain, nausea, vomiting, and constipation nearly 2 weeks ago. Symptoms became progressive worse 02/26/21 and she went to the ED. 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  NG tube was placed to low wall intermittent suction.  Gastroenterology and general surgery were consulted and patient underwent EGD with findings of thickened folds mucosa in the antrum, extrinsic deformity second portion of duodenum s/p biopsy, no obvious mass.  GI recommended further evaluation with MRI/MRCP so patient was transferred to [facility] for this. Unfortunately when she got here they realized the leads of her pacemaker are not compatible with MRI so this cannot be performed. General surgery asked to see. Patient states that over she feels better after NG tube placement. Denies abdominal pain, nausea, vomiting. Passing some flatus, no BM since admission. CEA and Ca19-9 are pending. Tbili elevated at 1.8.  Abdominal surgical history: hysterectomy, cholecystectomy Lives at home independently, has family that helps daily and someone that stays over night with her Ambulates with cane PRN  Review of Systems  Constitutional: Negative.   Respiratory: Negative.    Cardiovascular:  Negative for chest pain.  Gastrointestinal:  Positive for abdominal pain, constipation, nausea and vomiting.  Musculoskeletal:  Positive for joint pain.   All systems  reviewed and otherwise negative except for as above  Family History  Problem Relation Age of Onset   Hypertension Father    Heart disease Father    Clotting disorder Father    Cancer Mother        breast   Heart disease Sister    Thyroid disease Sister    Diabetes Sister    Breast cancer Daughter        22's, twice diagnosed, double mastectomy   Breast cancer Daughter        3's    Past Medical History:  Diagnosis Date   Anemia    Arrhythmia    atrial fibrillation   Asthma    Breast cancer ([place]) 1978   left   CAD (coronary artery disease)    CHF (congestive heart failure) (HCC)    COPD (chronic obstructive pulmonary disease) (HCC)    DVT (deep venous thrombosis) (HCC)    Esophageal stricture    Hemorrhoids    History of knee replacement, total    Hyperlipidemia    Hypertension    Ischemic cardiomyopathy    Osteoporosis    Paroxysmal atrial fibrillation (HCC)    Peripheral arterial disease (HCC)    Sick sinus syndrome ([place])     Past Surgical History:  Procedure Laterality Date   ABDOMINAL HYSTERECTOMY  1985   HEMORRHOID SURGERY     ILIAC ARTERY STENT     INTRAOPERATIVE ARTERIOGRAM Right    MASTECTOMY Left 1978   PACEMAKER INSERTION Left 01/11/2020   Procedure: PACEMAKER GENERATOR CHANGE OUT;  Surgeon: Isaias Cowman, MD;  Location: [facility] ORS;  Service: Cardiovascular;  Laterality: Left;   REDUCTION MAMMAPLASTY Right 1985   impant placed  then removed    Social History:  reports that she quit smoking about 34 years ago. Her smoking use included cigarettes. She has never used smokeless tobacco. She reports that she does not drink alcohol and does not use drugs.  Allergies:  Allergies  Allergen Reactions   Penicillins Anaphylaxis   Amoxicillin    Beta Adrenergic Blockers    Meloxicam     Other reaction(s): Unknown   Naproxen     Other reaction(s): Unknown   Nsaids     Other reaction(s): Unknown    Medications Prior to Admission  Medication Sig  Dispense Refill   acetaminophen (TYLENOL) 160 MG/5ML solution Take 20.3 mLs (650 mg total) by mouth every 6 (six) hours as needed for mild pain. 120 mL 0   albuterol (VENTOLIN HFA) 108 (90 Base) MCG/ACT inhaler Inhale 2 puffs into the lungs every 4 (four) hours as needed for wheezing or shortness of breath.     ALPRAZolam (XANAX) 0.25 MG tablet Take 1 tablet (0.25 mg total) by mouth 3 (three) times daily as needed for anxiety. 30 tablet 0   dilTIAZem HCl-Dextrose 125-5 MG/125ML-% SOLN infusion Inject 5-15 mg/hr into the vein continuous.     ipratropium-albuterol (DUONEB) 0.5-2.5 (3) MG/3ML SOLN Take 3 mLs by nebulization every 6 (six) hours as needed. 360 mL    lactated ringers infusion Inject 50 mLs into the vein continuous. 250 mL 0   mometasone-formoterol (DULERA) 200-5 MCG/ACT AERO Inhale 2 puffs into the lungs 2 (two) times daily. 1 each    morphine 2 MG/ML injection Inject 0.25 mLs (0.5 mg total) into the vein every 3 (three) hours as needed. 1 mL 0   nitroGLYCERIN (NITROSTAT) 0.4 MG SL tablet Place 1 tablet (0.4 mg total) under the tongue as directed.  12    Prior to Admission medications   Medication Sig Start Date End Date Taking? Authorizing Provider  acetaminophen (TYLENOL) 160 MG/5ML solution Take 20.3 mLs (650 mg total) by mouth every 6 (six) hours as needed for mild pain. 03/02/21   Nolberto Hanlon, MD  albuterol (VENTOLIN HFA) 108 (90 Base) MCG/ACT inhaler Inhale 2 puffs into the lungs every 4 (four) hours as needed for wheezing or shortness of breath. 03/02/21   Nolberto Hanlon, MD  ALPRAZolam Duanne Moron) 0.25 MG tablet Take 1 tablet (0.25 mg total) by mouth 3 (three) times daily as needed for anxiety. 03/02/21   Nolberto Hanlon, MD  dilTIAZem HCl-Dextrose 125-5 MG/125ML-% SOLN infusion Inject 5-15 mg/hr into the vein continuous. 03/02/21   Nolberto Hanlon, MD  ipratropium-albuterol (DUONEB) 0.5-2.5 (3) MG/3ML SOLN Take 3 mLs by nebulization every 6 (six) hours as needed. 03/02/21   Nolberto Hanlon, MD   lactated ringers infusion Inject 50 mLs into the vein continuous. 03/02/21   Nolberto Hanlon, MD  mometasone-formoterol (DULERA) 200-5 MCG/ACT AERO Inhale 2 puffs into the lungs 2 (two) times daily. 03/02/21   Nolberto Hanlon, MD  morphine 2 MG/ML injection Inject 0.25 mLs (0.5 mg total) into the vein every 3 (three) hours as needed. 03/02/21   Nolberto Hanlon, MD  nitroGLYCERIN (NITROSTAT) 0.4 MG SL tablet Place 1 tablet (0.4 mg total) under the tongue as directed. 03/02/21   Nolberto Hanlon, MD    Blood pressure 108/78, pulse (!) 112, temperature 99 F (37.2 C), temperature source Oral, resp. rate (!) 21, weight 77.9 kg, SpO2 98 %. Physical Exam: General: pleasant, elderly female who is laying in bed in NAD HEENT: head is normocephalic, atraumatic.  Sclera are noninjected.  Pupils  equal and round.  Ears and nose without any masses or lesions.  Mouth is pink and moist. Dentition fair Heart: tachycardic 110s, irregular. No obvious murmurs, gallops, or rubs noted.  Palpable pedal pulses bilaterally  Lungs: CTAB, no wheezes, rhonchi, or rales noted.  Respiratory effort nonlabored Abd: soft, NT/ND, +BS, no masses, hernias, or organomegaly MS: no BUE/BLE edema, calves soft and nontender Skin: warm and dry with no masses, lesions, or rashes Psych: A&Ox4 with an appropriate affect Neuro: cranial nerves grossly intact, equal strength in BUE/BLE bilaterally, normal speech, thought process intact  Results for orders placed or performed during the hospital encounter of 03/02/21 (from the past 48 hour(s))  CBC     Status: None   Collection Time: 03/02/21  5:48 PM  Result Value Ref Range   WBC 9.9 4.0 - 10.5 K/uL   RBC 4.38 3.87 - 5.11 MIL/uL   Hemoglobin 13.8 12.0 - 15.0 g/dL   HCT 41.8 36.0 - 46.0 %   MCV 95.4 80.0 - 100.0 fL   MCH 31.5 26.0 - 34.0 pg   MCHC 33.0 30.0 - 36.0 g/dL   RDW 14.6 11.5 - 15.5 %   Platelets 161 150 - 400 K/uL   nRBC 0.0 0.0 - 0.2 %    Comment: Performed at [facility],  [place] [address], [place], [place] [address]  Comprehensive metabolic panel     Status: Abnormal   Collection Time: 03/02/21  5:48 PM  Result Value Ref Range   Sodium 143 135 - 145 mmol/L   Potassium 3.5 3.5 - 5.1 mmol/L   Chloride 103 98 - 111 mmol/L   CO2 29 22 - 32 mmol/L   Glucose, Bld 130 (H) 70 - 99 mg/dL    Comment: Glucose reference range applies only to samples taken after fasting for at least 8 hours.   BUN 35 (H) 8 - 23 mg/dL   Creatinine, Ser 1.42 (H) 0.44 - 1.00 mg/dL   Calcium 9.0 8.9 - 10.3 mg/dL   Total Protein 5.9 (L) 6.5 - 8.1 g/dL   Albumin 3.0 (L) 3.5 - 5.0 g/dL   AST 28 15 - 41 U/L   ALT 13 0 - 44 U/L   Alkaline Phosphatase 61 38 - 126 U/L   Total Bilirubin 2.2 (H) 0.3 - 1.2 mg/dL   GFR, Estimated 35 (L) >60 mL/min    Comment: (NOTE) Calculated using the CKD-EPI Creatinine Equation (2021)    Anion gap 11 5 - 15    Comment: Performed at [facility], [place] [address] [place], [place] [address]  Protime-INR     Status: Abnormal   Collection Time: 03/02/21  5:48 PM  Result Value Ref Range   Prothrombin Time 19.2 (H) 11.4 - 15.2 seconds   INR 1.6 (H) 0.8 - 1.2    Comment: (NOTE) INR goal varies based on device and disease states. Performed at [facility], [place] [address], [place],  [address]   Comprehensive metabolic panel     Status: Abnormal   Collection Time: 03/03/21 12:57 AM  Result Value Ref Range   Sodium 144 135 - 145 mmol/L   Potassium 3.6 3.5 - 5.1 mmol/L   Chloride 105 98 - 111 mmol/L   CO2 25 22 - 32 mmol/L   Glucose, Bld 121 (H) 70 - 99 mg/dL    Comment: Glucose reference range applies only to samples taken after fasting for at least 8 hours.   BUN 34 (H)  8 - 23 mg/dL   Creatinine, Ser 1.35 (H) 0.44 - 1.00 mg/dL   Calcium 9.1 8.9 - 10.3 mg/dL   Total Protein 5.4 (L) 6.5 - 8.1 g/dL   Albumin 2.8 (L) 3.5 - 5.0 g/dL   AST 26 15 - 41 U/L   ALT 10 0 - 44 U/L   Alkaline Phosphatase 58 38 - 126 U/L   Total Bilirubin 1.8 (H) 0.3 -  1.2 mg/dL   GFR, Estimated 37 (L) >60 mL/min    Comment: (NOTE) Calculated using the CKD-EPI Creatinine Equation (2021)    Anion gap 14 5 - 15    Comment: Performed at [facility], [place] [address], [place], [place] [address]  Glucose, capillary     Status: Abnormal   Collection Time: 03/03/21  6:08 AM  Result Value Ref Range   Glucose-Capillary 140 (H) 70 - 99 mg/dL    Comment: Glucose reference range applies only to samples taken after fasting for at least 8 hours.   DG Abd 1 View  Result Date: 03/01/2021 CLINICAL DATA:  Check gastric catheter placement EXAM: ABDOMEN - 1 VIEW COMPARISON:  Film from earlier in the same day. FINDINGS: Gastric catheter is again noted within the stomach. Proximal side port lies near the gastroesophageal junction. This could be advanced a few more cm deeper into the stomach. IMPRESSION: Gastric catheter as described which could be advanced further into the stomach. Electronically Signed   By: Inez Catalina M.D.   On: 03/01/2021 18:39   [place] EKG SITE RITE  Result Date: 03/03/2021 If Site Rite image not attached, placement could not be confirmed due to current cardiac rhythm.   Anti-infectives (From admission, onward)    None        Assessment/Plan Partial gastric outlet obstruction Biliary obstruction Hyperbilirubinemia - TB 1.8 Ampullary vs duodenal mass - CT abdomen/pelvis was notable for a duodenal mass at the junction of the second and third part of the duodenum causing dilation proximally and possibly obstructing the distal common bile duct - unable to obtain MRCP due to pacemaker. GI consult at [facility] pending. Will follow up on their recommendations for further work up (?ERCP/EUS, likely needs biliary stent placement) - Recommend palliative care consult to initiate goals of care discussions. Patient states "she needs to talk to family" and is not sure if she would want to undergo any surgery - Check prealbumin, need to consider starting TPN -  We will follow  ID - none VTE - hold coumadin, ok for IV heparin or chemical dvt prophylaxis per primary FEN - IVF, NPO/NGT to LIWS Foley - none  Atrial fibrillation with RVR COPD Chronic diastolic CHF CAD HTN HLD SSS s/p PPM GERD CKD stage IIIa Code status DNR  [place]ellington [place], [facility] Surgery 03/03/2021, 8:49 AM Please see Amion for pager number during day hours 7:00am-4:30pm

## 2021-03-03 NOTE — Progress Notes (Signed)
Shanon Brow RN and [place] notified PICC is ready to use per CXR results.

## 2021-03-03 NOTE — Progress Notes (Signed)
PROGRESS NOTE    [name] [name]  FFM:[id number] DOB: 1927/07/17 DOA: 03/02/2021 PCP: Tracie Harrier, MD    Brief Narrative:  [name] [name] is a 87 y.o. female with medical history significant of essential hypertension, COPD, CKD stage IIIa, SSS s/p PPM, paroxysmal atrial fibrillation on Coumadin, CAD, chronic diastolic congestive heart failure, hyperlipidemia, GERD, peripheral vascular disease, distant history of breast cancer who presents as a transfer from [facility] for partial gastric outlet obstruction and duodenal mass.   HPI obtained from patient and patient's daughter who is present at bedside.  Apparently, patient was experiencing nausea/vomiting and abdominal pain with associated distention.  Symptoms apparently progressed over 2-week, and subsequently sought care at [facility] ED on 02/26/2021.  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  NG tube was placed to low wall intermittent suction.  Gastroenterology and general surgery were consulted and patient underwent EGD with findings of thickened folds mucosa in the antrum, extrinsic deformity second portion of duodenum s/p biopsy, no obvious mass.  General surgery recommended potential surgical options with biliary stent versus gastrojejunostomy. GI recommending further evaluation with MRI/MRCP, given patient with pacemaker present, GI recommended transfer to [facility] where MRI can be performed safely with pacemaker in place.  At baseline, patient is independent of ADLs, uses a cane occasionally for ambulation and lives by herself with a family member staying with her at night.   Assessment & Plan:   Principal Problem:   Partial gastric outlet obstruction Active Problems:   Essential (primary) hypertension   Pacemaker   Atrial fibrillation with RVR (HCC)    COPD (chronic obstructive pulmonary disease) (HCC)   Abdominal pain   CKD (chronic kidney disease), stage IIIa   Duodenal mass   Partial gastric outlet obstruction Biliary obstruction Ampullary vs duodenal mass Patient presenting as a transfer from [facility] with 2-week history of progressive nausea/vomiting and abdominal pain with associated distention.  Total bilirubin elevated 2.1.  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 EGD with findings of thickened folds mucosa in the antrum, extrinsic deformity second portion of duodenum s/p biopsy, no obvious mass.  GI and general surgery recommending MRI/MRCP, which cannot be formed at [facility] apparently. [facility] MRI tech states confirmed with [employer] her pacemaker leads placed in 2009 are not compatible with MRI; so unable to obtain MRI/MRCP --[facility]/General surgery following, appreciate assistance --CEA and CA 19-9 pending --N.p.o., okay for ice chips --NG tube to low wall intermittent suction --D5 half NS at 50 mL/hr per hour --PICC line ordered to initiate TPN today --Palliative care consultation for assistance with goals of care discussions --Continue monitor NG output --CMP daily   Atrial fibrillation with RVR Prior to EGD at [facility], patient's heart rate became elevated.  Given her n.p.o. status, she was placed on a Cardizem drip.  Home medication includes warfarin and metoprolol succinate 50 mg p.o. daily. --Cardizem drip, now off --Metoprolol 5 mg IV every 6 hours; hold for HR <60 or SBP <100 --Holding warfarin for possible need of surgical/endoscopic intervention --Monitor on telemetry   COPD On Symbicort, Singulair, Spiriva and albuterol MDI as needed at home. --Dulera 2 puffs twice daily as hospital substitution --Incruse Ellipta 1 puff daily as  hospital substitution --DuoNebs every 6  hours as needed for shortness of breath/wheezing   Chronic diastolic congestive heart failure, compensated On metoprolol succinate 50 mg p.o. daily and furosemide 40 mg p.o. daily at baseline. --Holding oral medications while n.p.o. --Strict I's and O's Daily weights   SSS s/p PPM Patient underwent recent pacemaker generator change on 01/11/2020.  Current generator is listed as aPACE AZURE XT DR MRI, her pacemaker leads were placed in 2009 which were not MRI compatible, per MRI tech. --Continue monitor on telemetry   GERD: Holding home PPI while n.p.o.   CKD stage IIIa: Creatinine baseline 1.3. --Cr 1.46>1.52>1.51>1.35 --Avoid nephrotoxins, renally dose all medications --IV fluid hydration as above, starting TPN --Monitor function closely daily   DVT prophylaxis: Place and maintain sequential compression device Start: 03/02/21 1804    Code Status: DNR Family Communication: Updated family member present at bedside this morning  Disposition Plan:  Level of care: Telemetry Medical Status is: Inpatient  Remains inpatient appropriate because:Ongoing diagnostic testing needed not appropriate for outpatient work up, Unsafe d/c plan, IV treatments appropriate due to intensity of illness or inability to take PO, and Inpatient level of care appropriate due to severity of illness  Dispo: The patient is from: Home              Anticipated d/c is to: Home              Patient currently is not medically stable to d/c.   Difficult to place patient No   Consultants:  [facility], Dr. Fuller Plan General Surgery Palliative care  Procedures:  EGD - performed at [facility]  Antimicrobials:  None   Subjective: Patient seen examined at bedside, resting comfortably.  Family present.  Reports continued mild abdominal discomfort and irritation from NG tube.  CCS PA and RN present at bedside.  Patient wishes she could go home.  Continues with NG tube in place to low wall intermittent suction with bilious  drainage.  Awaiting GI evaluation for consideration of ERCP/EUS, possible biliary stent placement.  Will consult palliative care for assistance with goals of care, as patient does not know if she would want any type of surgery.  Given 7 days n.p.o., ordered PICC line with TPN to initiate today.  Patient and family with no other questions or concerns at this time.  Patient denies headache, no visual changes, no fever/chills/night sweats, no nausea/vomiting/diarrhea, no chest pain, no palpitations, no shortness of breath, no current abdominal distention, no weakness, no fatigue, no paresthesias.  No acute events overnight per nursing staff.  Objective: Vitals:   03/03/21 0345 03/03/21 0632 03/03/21 0800 03/03/21 0833  BP: (!) 110/31 96/76 108/78   Pulse: 60 (!) 104 (!) 112   Resp: 15 20 (!) 21   Temp: 99.2 F (37.3 C)  99 F (37.2 C)   TempSrc: Oral  Oral   SpO2: 100% 98% 97% 98%  Weight: 77.9 kg       Intake/Output Summary (Last 24 hours) at 03/03/2021 1024 Last data filed at 03/03/2021 0500 Gross per 24 hour  Intake 551.87 ml  Output 300 ml  Net 251.87 ml   Filed Weights   03/03/21 0345  Weight: 77.9 kg    Examination:  General exam: Appears calm and comfortable  Respiratory system: Clear to auscultation. Respiratory effort normal.  On room air Cardiovascular system: S1 & S2 heard, tachycardic, irregularly irregular rhythm no JVD, murmurs, rubs, gallops or clicks. No pedal edema. Gastrointestinal system: Abdomen is nondistended,  soft, and nontender. No organomegaly or masses felt. Normal bowel sounds heard.  NG tube noted with bilious drainage Central nervous system: Alert and oriented. No focal neurological deficits. Extremities: Symmetric 5 x 5 power. Skin: No rashes, lesions or ulcers Psychiatry: Judgement and insight appear normal. Mood & affect appropriate.     Data Reviewed: I have personally reviewed following labs and imaging studies  CBC: Recent Labs  Lab  02/26/21 1305 02/27/21 0429 02/28/21 0404 03/01/21 0500 03/02/21 1748  WBC 6.4 7.7 9.0 8.7 9.9  HGB 13.7 13.6 14.2 14.4 13.8  HCT 42.5 40.4 43.0 43.8 41.8  MCV 95.7 93.1 95.8 96.5 95.4  PLT 185 178 169 162 726   Basic Metabolic Panel: Recent Labs  Lab 02/27/21 0429 02/28/21 0404 03/01/21 0500 03/02/21 1748 03/03/21 0057  NA 140 143 146* 143 144  K 4.5 4.3 3.9 3.5 3.6  CL 102 103 103 103 105  CO2 26 29 29 29 25   GLUCOSE 119* 95 79 130* 121*  BUN 35* 38* 43* 35* 34*  CREATININE 1.46* 1.52* 1.51* 1.42* 1.35*  CALCIUM 9.8 9.9 9.7 9.0 9.1  MG 2.2 2.2  --   --   --   PHOS  --  4.9*  --   --   --    GFR: Estimated Creatinine Clearance: 26.8 mL/min (A) (by C-G formula based on SCr of 1.35 mg/dL (H)). Liver Function Tests: Recent Labs  Lab 02/26/21 1305 02/27/21 0429 02/28/21 0404 03/01/21 0500 03/02/21 1748 03/03/21 0057  AST 28  --  27 28 28 26   ALT 16  --  15 16 13 10   ALKPHOS 75  --  75 59 61 58  BILITOT 1.4* 1.3* 1.8*  1.8* 2.1* 2.2* 1.8*  PROT 7.2  --  7.4 6.9 5.9* 5.4*  ALBUMIN 4.0  --  4.2 3.7 3.0* 2.8*   Recent Labs  Lab 02/26/21 1305  LIPASE 44   No results for input(s): AMMONIA in the last 168 hours. Coagulation Profile: Recent Labs  Lab 02/27/21 0429 02/28/21 0404 03/01/21 0500 03/02/21 0514 03/02/21 1748  INR 2.4* 2.3* 1.5* 1.4* 1.6*   Cardiac Enzymes: No results for input(s): CKTOTAL, CKMB, CKMBINDEX, TROPONINI in the last 168 hours. BNP (last 3 results) No results for input(s): PROBNP in the last 8760 hours. HbA1C: No results for input(s): HGBA1C in the last 72 hours. CBG: Recent Labs  Lab 02/28/21 0725 03/01/21 0743 03/02/21 0751 03/02/21 1450 03/03/21 0608  GLUCAP 93 77 132* 93 140*   Lipid Profile: No results for input(s): CHOL, HDL, LDLCALC, TRIG, CHOLHDL, LDLDIRECT in the last 72 hours. Thyroid Function Tests: No results for input(s): TSH, T4TOTAL, FREET4, T3FREE, THYROIDAB in the last 72 hours. Anemia Panel: No results  for input(s): VITAMINB12, FOLATE, FERRITIN, TIBC, IRON, RETICCTPCT in the last 72 hours. Sepsis Labs: No results for input(s): PROCALCITON, LATICACIDVEN in the last 168 hours.  Recent Results (from the past 240 hour(s))  SARS CORONAVIRUS 2 (TAT 6-24 HRS) Nasopharyngeal Nasopharyngeal Swab     Status: None   Collection Time: 02/26/21  2:32 PM   Specimen: Nasopharyngeal Swab  Result Value Ref Range Status   SARS Coronavirus 2 NEGATIVE NEGATIVE Final    Comment: (NOTE) SARS-CoV-2 target nucleic acids are NOT DETECTED.  The SARS-CoV-2 RNA is generally detectable in upper and lower respiratory specimens during the acute phase of infection. Negative results do not preclude SARS-CoV-2 infection, do not rule out co-infections with other pathogens, and should not be used as the  sole basis for treatment or other patient management decisions. Negative results must be combined with clinical observations, patient history, and epidemiological information. The expected result is Negative.  Fact Sheet for Patients: SugarRoll.be  Fact Sheet for Healthcare Providers: https://www.woods-mathews.com/  This test is not yet approved or cleared by the [place] FDA and  has been authorized for detection and/or diagnosis of SARS-CoV-2 by FDA under an Emergency Use Authorization (EUA). This EUA will remain  in effect (meaning this test can be used) for the duration of the COVID-19 declaration under Se ction 564(b)(1) of the Act, 21 U.S.C. section 360bbb-3(b)(1), unless the authorization is terminated or revoked sooner.  Performed at [facility], [place] [address], [place], [place] [address]          Radiology Studies: DG Abd 1 View  Result Date: 03/01/2021 CLINICAL DATA:  Check gastric catheter placement EXAM: ABDOMEN - 1 VIEW COMPARISON:  Film from earlier in the same day. FINDINGS: Gastric catheter is again noted within the stomach. Proximal  side port lies near the gastroesophageal junction. This could be advanced a few more cm deeper into the stomach. IMPRESSION: Gastric catheter as described which could be advanced further into the stomach. Electronically Signed   By: Inez Catalina M.D.   On: 03/01/2021 18:39   [place] EKG SITE RITE  Result Date: 03/03/2021 If Site Rite image not attached, placement could not be confirmed due to current cardiac rhythm.       Scheduled Meds:  metoprolol tartrate  5 mg Intravenous Q6H   mometasone-formoterol  2 puff Inhalation BID   umeclidinium bromide  1 puff Inhalation Daily   Continuous Infusions:  dextrose 5 % and 0.45% NaCl 50 mL/hr at 03/02/21 2032   diltiazem (CARDIZEM) infusion 10 mg/hr (03/03/21 0114)     LOS: 1 day    Time spent: 45 minutes spent on chart review, discussion with nursing staff, consultants, updating family and interview/physical exam; more than 50% of that time was spent in counseling and/or coordination of care.    Nassir Neidert J [place] DO [facility] Available via Epic secure chat 7am-7pm After these hours, please refer to coverage provider listed on [email or web address] 03/03/2021, 10:24 AM

## 2021-03-03 NOTE — Progress Notes (Addendum)
PHARMACY - TOTAL PARENTERAL NUTRITION CONSULT NOTE   Indication:  partial gastric outlet obstruction  and duodenal mass  Patient Measurements: Weight: 77.9 kg (171 lb 11.8 oz)   Body mass index is 30.42 kg/m. Usual Weight: unknown  Assessment:  82 yof experiencing N/V and abdominal pain with distention progressing over two week period who presented to [facility] ED on 6/7. 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  NGT placed on LIWS. Surgery and GI consulted. EGD with findings of thickened folds mucosa in the antrum, extrinsic deformity second portion of duodenum s/p biopsy, no obvious mass. MRCP recommended but unable to be done due to PPM. Transferred to [facility] for continued work-up.  Spoke with daughter who reports patient has had some unintentional weight loss but this has not been a drastic decrease in weight. She did not know usual weight. She reports patient does not eat much but enjoys the following: a piece of toast, hot dog, meat and cheese sandwich , banana sandwich. She confirmed patient has not eaten much over last 3 weeks or so due to sickness.   Glucose / Insulin: no hx DM Electrolytes: Na 144, K 3.6, Phos pending, CoCa 10.1 Renal: CKD, SCr 1.35 (Baseline 1.3), BUN 34 Hepatic: LFTs WNL, TG pending, Tbili down 1.8, albumin 2.8, prealbumin pending Intake / Output; MIVF: D5-0.45NS at 50 ml/hr GI Imaging: 6/10 Abd XR: no bowel obstruction or free air, mod air in stomach GI Surgeries / Procedures:   Central access: triple lumen PICC placed 6/12 TPN start date: 03/03/21  Nutritional Goals (per RD recommendation TBD): kCal: 1950-2150, Protein: 75-95 g, Fluid: ~2L Goal TPN rate is 85 mL/hr (provides 79 g of protein and 2069 kcals per day)  Current Nutrition:  NPO and TPN  Plan:  Start TPN at 47mL/hr at 1800 Electrolytes in TPN: Na  49mEq/L, K 46mEq/L, Ca 54mEq/L, Mg 58mEq/L, and Phos 63mmol/L. Cl:Ac 1:1 Add standard MVI and trace elements to TPN Initiate Sensitive q6h SSI and adjust as needed  Discontinue MIVF at 1800 per Dr [place] Watch lytes closely due to refeeding risk - labs unavailable for review Monitor TPN labs on Mon/Thurs  KCl 10 meq IV q1h x4  Thank you for involving pharmacy in this patient's care.  Renold Genta, PharmD, BCPS Clinical Pharmacist Clinical phone for 03/03/2021 until 3p is [phone] 03/03/2021 9:33 AM  **Pharmacist phone directory can be found on [email or web address] listed under [place]**

## 2021-03-04 ENCOUNTER — Inpatient Hospital Stay (HOSPITAL_COMMUNITY): Payer: PPO | Admitting: Anesthesiology

## 2021-03-04 ENCOUNTER — Encounter (HOSPITAL_COMMUNITY)
Admission: AD | Disposition: A | Discharge status: Hospice | Payer: Self-pay | Source: Other Acute Inpatient Hospital | Attending: Internal Medicine

## 2021-03-04 ENCOUNTER — Encounter: Payer: Self-pay | Admitting: Gastroenterology

## 2021-03-04 DIAGNOSIS — K259 Gastric ulcer, unspecified as acute or chronic, without hemorrhage or perforation: Secondary | ICD-10-CM

## 2021-03-04 DIAGNOSIS — K315 Obstruction of duodenum: Secondary | ICD-10-CM

## 2021-03-04 HISTORY — PX: ESOPHAGOGASTRODUODENOSCOPY (EGD) WITH PROPOFOL: SHX5813

## 2021-03-04 HISTORY — PX: UPPER ESOPHAGEAL ENDOSCOPIC ULTRASOUND (EUS): SHX6562

## 2021-03-04 HISTORY — PX: BIOPSY: SHX5522

## 2021-03-04 LAB — COMPREHENSIVE METABOLIC PANEL
ALT: 10 U/L (ref 0–44)
AST: 20 U/L (ref 15–41)
Albumin: 2.6 g/dL — ABNORMAL LOW (ref 3.5–5.0)
Alkaline Phosphatase: 58 U/L (ref 38–126)
Anion gap: 8 (ref 5–15)
BUN: 29 mg/dL — ABNORMAL HIGH (ref 8–23)
CO2: 32 mmol/L (ref 22–32)
Calcium: 9.2 mg/dL (ref 8.9–10.3)
Chloride: 103 mmol/L (ref 98–111)
Creatinine, Ser: 1.27 mg/dL — ABNORMAL HIGH (ref 0.44–1.00)
GFR, Estimated: 40 mL/min — ABNORMAL LOW (ref >60)
Glucose, Bld: 144 mg/dL — ABNORMAL HIGH (ref 70–99)
Potassium: 3.5 mmol/L (ref 3.5–5.1)
Sodium: 143 mmol/L (ref 135–145)
Total Bilirubin: 1.8 mg/dL — ABNORMAL HIGH (ref 0.3–1.2)
Total Protein: 5.7 g/dL — ABNORMAL LOW (ref 6.5–8.1)

## 2021-03-04 LAB — DIFFERENTIAL
Abs Immature Granulocytes: 0.07 10*3/uL (ref 0.00–0.07)
Basophils Absolute: 0 10*3/uL (ref 0.0–0.1)
Basophils Relative: 0 %
Eosinophils Absolute: 0 10*3/uL (ref 0.0–0.5)
Eosinophils Relative: 0 %
Immature Granulocytes: 1 %
Lymphocytes Relative: 13 %
Lymphs Abs: 1.4 10*3/uL (ref 0.7–4.0)
Monocytes Absolute: 1.1 10*3/uL — ABNORMAL HIGH (ref 0.1–1.0)
Monocytes Relative: 10 %
Neutro Abs: 8 10*3/uL — ABNORMAL HIGH (ref 1.7–7.7)
Neutrophils Relative %: 76 %

## 2021-03-04 LAB — GLUCOSE, CAPILLARY
Glucose-Capillary: 121 mg/dL — ABNORMAL HIGH (ref 70–99)
Glucose-Capillary: 132 mg/dL — ABNORMAL HIGH (ref 70–99)
Glucose-Capillary: 152 mg/dL — ABNORMAL HIGH (ref 70–99)
Glucose-Capillary: 153 mg/dL — ABNORMAL HIGH (ref 70–99)

## 2021-03-04 LAB — CBC
HCT: 40 % (ref 36.0–46.0)
Hemoglobin: 12.9 g/dL (ref 12.0–15.0)
MCH: 31.5 pg (ref 26.0–34.0)
MCHC: 32.3 g/dL (ref 30.0–36.0)
MCV: 97.8 fL (ref 80.0–100.0)
Platelets: 142 10*3/uL — ABNORMAL LOW (ref 150–400)
RBC: 4.09 MIL/uL (ref 3.87–5.11)
RDW: 15 % (ref 11.5–15.5)
WBC: 10.6 10*3/uL — ABNORMAL HIGH (ref 4.0–10.5)
nRBC: 0 % (ref 0.0–0.2)

## 2021-03-04 LAB — PHOSPHORUS: Phosphorus: 2.2 mg/dL — ABNORMAL LOW (ref 2.5–4.6)

## 2021-03-04 LAB — TRIGLYCERIDES: Triglycerides: 107 mg/dL (ref <150)

## 2021-03-04 LAB — CEA: CEA: 5.8 ng/mL — ABNORMAL HIGH (ref 0.0–4.7)

## 2021-03-04 LAB — MAGNESIUM: Magnesium: 2 mg/dL (ref 1.7–2.4)

## 2021-03-04 LAB — CANCER ANTIGEN 19-9: CA 19-9: <2 U/mL (ref 0–35)

## 2021-03-04 LAB — PREALBUMIN: Prealbumin: 9.1 mg/dL — ABNORMAL LOW (ref 18–38)

## 2021-03-04 SURGERY — ESOPHAGOGASTRODUODENOSCOPY (EGD) WITH PROPOFOL
Anesthesia: Monitor Anesthesia Care

## 2021-03-04 MED ORDER — PHENYLEPHRINE HCL (PRESSORS) 10 MG/ML IV SOLN
INTRAVENOUS | Status: DC | PRN
  Administered 2021-03-04 (×2): 80 ug via INTRAVENOUS

## 2021-03-04 MED ORDER — PANTOPRAZOLE SODIUM 40 MG IV SOLR
40.0000 mg | Freq: Two times a day (BID) | INTRAVENOUS | Status: DC
  Administered 2021-03-04 – 2021-03-09 (×10): 40 mg via INTRAVENOUS
  Filled 2021-03-04 (×10): qty 40

## 2021-03-04 MED ORDER — FENTANYL CITRATE (PF) 100 MCG/2ML IJ SOLN
INTRAMUSCULAR | Status: AC
  Filled 2021-03-04: qty 2

## 2021-03-04 MED ORDER — METRONIDAZOLE 500 MG/100ML IV SOLN
500.0000 mg | Freq: Two times a day (BID) | INTRAVENOUS | Status: DC
  Administered 2021-03-04 – 2021-03-09 (×10): 500 mg via INTRAVENOUS
  Filled 2021-03-04 (×10): qty 100

## 2021-03-04 MED ORDER — POTASSIUM PHOSPHATES 15 MMOLE/5ML IV SOLN
10.0000 mmol | Freq: Once | INTRAVENOUS | Status: AC
  Administered 2021-03-04: 10 mmol via INTRAVENOUS
  Filled 2021-03-04: qty 3.33

## 2021-03-04 MED ORDER — FENTANYL CITRATE (PF) 100 MCG/2ML IJ SOLN
INTRAMUSCULAR | Status: DC | PRN
  Administered 2021-03-04: 25 ug via INTRAVENOUS

## 2021-03-04 MED ORDER — SODIUM CHLORIDE 0.9 % IV SOLN: INTRAVENOUS | Status: DC | PRN

## 2021-03-04 MED ORDER — ONDANSETRON HCL 4 MG/2ML IJ SOLN
INTRAMUSCULAR | Status: DC | PRN
  Administered 2021-03-04: 4 mg via INTRAVENOUS

## 2021-03-04 MED ORDER — POTASSIUM CHLORIDE 10 MEQ/50ML IV SOLN
10.0000 meq | INTRAVENOUS | Status: AC
  Administered 2021-03-04 (×2): 10 meq via INTRAVENOUS
  Filled 2021-03-04 (×2): qty 50

## 2021-03-04 MED ORDER — CIPROFLOXACIN IN D5W 400 MG/200ML IV SOLN
400.0000 mg | INTRAVENOUS | Status: DC
  Administered 2021-03-04 – 2021-03-08 (×5): 400 mg via INTRAVENOUS
  Filled 2021-03-04 (×8): qty 200

## 2021-03-04 MED ORDER — SUCCINYLCHOLINE CHLORIDE 200 MG/10ML IV SOSY
PREFILLED_SYRINGE | INTRAVENOUS | Status: DC | PRN
  Administered 2021-03-04: 120 mg via INTRAVENOUS

## 2021-03-04 MED ORDER — CIPROFLOXACIN IN D5W 400 MG/200ML IV SOLN: 400.0000 mg | Freq: Two times a day (BID) | INTRAVENOUS | Status: DC

## 2021-03-04 MED ORDER — ROCURONIUM BROMIDE 10 MG/ML (PF) SYRINGE
PREFILLED_SYRINGE | INTRAVENOUS | Status: DC | PRN
  Administered 2021-03-04: 40 mg via INTRAVENOUS

## 2021-03-04 MED ORDER — PHENYLEPHRINE HCL-NACL 10-0.9 MG/250ML-% IV SOLN
INTRAVENOUS | Status: DC | PRN
  Administered 2021-03-04: 75 ug/min via INTRAVENOUS

## 2021-03-04 MED ORDER — TRAVASOL 10 % IV SOLN
INTRAVENOUS | Status: AC
  Filled 2021-03-04: qty 561.6

## 2021-03-04 MED ORDER — ETOMIDATE 2 MG/ML IV SOLN
INTRAVENOUS | Status: DC | PRN
  Administered 2021-03-04: 14 mg via INTRAVENOUS

## 2021-03-04 MED ORDER — SUGAMMADEX SODIUM 200 MG/2ML IV SOLN
INTRAVENOUS | Status: DC | PRN
  Administered 2021-03-04: 200 mg via INTRAVENOUS

## 2021-03-04 SURGICAL SUPPLY — 14 items

## 2021-03-04 NOTE — Progress Notes (Signed)
Initial Nutrition Assessment  DOCUMENTATION CODES:   Not applicable  INTERVENTION:   -TPN management per pharmacy  NUTRITION DIAGNOSIS:   Inadequate oral intake related to altered GI function as evidenced by NPO status.  GOAL:   Patient will meet greater than or equal to 90% of their needs  MONITOR:   Diet advancement, Labs, Weight trends, Skin, I & O's  REASON FOR ASSESSMENT:   Consult New TPN/TNA  ASSESSMENT:   [name] [name] is a 87 y.o. female with medical history significant of essential hypertension, COPD, CKD stage IIIa, SSS s/p PPM, paroxysmal atrial fibrillation on Coumadin, CAD, chronic diastolic congestive heart failure, hyperlipidemia, GERD, peripheral vascular disease, distant history of breast cancer who presents as a transfer from [facility] for partial gastric outlet obstruction and duodenal mass.  Pt admitted with partial gastric outlet obstruction, biliary obstruction, and ampullary vs duodenal mass.   6/12- TPN initiated, PICC placed   Reviewed I/O's: -1.6 L x 24 hours and -1.4 L since admission  UOP: 800 ml x 24 hours  NGT output: 2.1 L x 24 hours  Pt very somnolent at time of visit. History obtained from daughter at bedside. Pt usually consumes 2-3 meals per day (Breakfast: cereal; Lunch: sandwich; Dinner: meat, starch, and vegetable).   Per daughter, pt is not very active- usually sits in her chair most of the day with her compression socks.   Per daughter, pt UBW is around 183#. She estimates pt has lost about 30 pounds within the past 3  months. Pt daughter reports pt was told to lose weight and was not concerned about weight loss. Per wt hx, pt has experienced a 15.6% wt loss over the past 6 months, which is significant for time frame.   Per general surgery notes, plan for EGD today. Pt may require a GJ tube, but pt and family hesitant to pursue surgical interventions.   Pt with NGT for decompression. TPN was started  yesterday- currently receiving @ 40 ml/hr, which provides 973 kcals and 37 grams protein, meeting 56% of estimated kcal needs and 41% of estimated protein needs. Per pharmacy note, plan to increase TPN to 60 ml/hr at 1800, which provides 1461 kcals and 56 grams protein, meeting 83% of estimated kcal needs and 62% of estimated protein needs.   Medications reviewed and include cardizem.   No results found for: HGBA1C PTA DM medications are none.   Labs reviewed: CBGS: 131-153 (inpatient orders for glycemic control are 0-9 units insulin aspart every 6 hours).    NUTRITION - FOCUSED PHYSICAL EXAM:  Flowsheet Row Most Recent Value  Orbital Region No depletion  Upper Arm Region No depletion  Thoracic and Lumbar Region No depletion  Buccal Region No depletion  Temple Region No depletion  Clavicle Bone Region Mild depletion  Clavicle and Acromion Bone Region Mild depletion  Scapular Bone Region Mild depletion  Dorsal Hand No depletion  Patellar Region No depletion  Anterior Thigh Region No depletion  Posterior Calf Region No depletion  Edema (RD Assessment) Mild  Hair Reviewed  Eyes Reviewed  Mouth Reviewed  Skin Reviewed  Nails Reviewed       Diet Order:   Diet Order             Diet NPO time specified Except for: Ice Chips  Diet effective now                   EDUCATION NEEDS:   Education needs have been  addressed  Skin:  Skin Assessment: Reviewed RN Assessment  Last BM:  02/26/21  Height:   Ht Readings from Last 1 Encounters:  03/01/21 5\' 3"  (1.6 m)    Weight:   Wt Readings from Last 1 Encounters:  03/04/21 70.5 kg    Ideal Body Weight:  52.3 kg  BMI:  Body mass index is 27.53 kg/m.  Estimated Nutritional Needs:   Kcal:  9536-9223  Protein:  90-105 grams  Fluid:  > 1.7 L    Loistine Chance, RD, LDN, [place] Registered Dietitian II Certified Diabetes Care and Education Specialist Please refer to [facility] for RD and/or RD on-call/weekend/after hours  pager

## 2021-03-04 NOTE — Progress Notes (Addendum)
PHARMACY - TOTAL PARENTERAL NUTRITION CONSULT NOTE   Indication:  partial gastric outlet obstruction  and duodenal mass  Patient Measurements: Weight: 70.5 kg (155 lb 6.8 oz)   Body mass index is 27.53 kg/m. Usual Weight: unknown  Assessment:  71 yof experiencing N/V and abdominal pain with distention progressing over two week period who presented to [facility] ED on 6/7. 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  NGT placed on LIWS. Surgery and GI consulted. EGD with findings of thickened folds mucosa in the antrum, extrinsic deformity second portion of duodenum s/p biopsy, no obvious mass. MRCP recommended but unable to be done due to PPM. Transferred to [facility] for continued work-up.  Daughter reports patient has had some unintentional weight loss but this has not been a drastic decrease in weight. She did not know usual weight. She reports patient does not eat much but enjoys the following: a piece of toast, hot dog, meat and cheese sandwich , banana sandwich. She confirmed patient has not eaten much over last 3 weeks or so due to sickness.   Glucose / Insulin: no hx DM Electrolytes: Na 143, K 3.5, Phos 2.2, Mg 2, CoCa 10.1 Renal: CKD, SCr 1.27 (Baseline 1.3), BUN 29 Hepatic: LFTs WNL, TG 100, Tbili down 1.8, albumin 2.6, prealbumin 9.1 Intake / Output; MIVF: UOP 800 ml, NG output 1300 ml, MIVF d/c'd GI Imaging: 6/7 CT A/P: dilation of the proximal duodenum which may represent duodenal mass  6/10 Abd XR: no bowel obstruction or free air, mod air in stomach GI Surgeries / Procedures:  6/10 EGD: biopsied thickened folds mucosa and duodenal deformity however no obvious mass   Central access: triple lumen PICC placed 6/12 TPN start date: 03/03/21  Nutritional Goals (per RD recommendation TBD): kCal: 1950-2150, Protein: 75-95 g, Fluid: ~2L Goal TPN  rate is 85 mL/hr (provides 79 g of protein and 2069 kcals per day)  Current Nutrition:  NPO and TPN  Plan:  Increase TPN to 26mL/hr at 1800 (goal 85 ml/hr) Electrolytes in TPN: Na 70mEq/L, K 91mEq/L, Ca 85mEq/L, Mg 60mEq/L, and Phos 16mmol/L. Cl:Ac 1:1 Add standard MVI and trace elements to TPN Initiate Sensitive q6h SSI and adjust as needed  Give Kphos 10 mmol IV x1 Give Kcl IV x 2 runs F/u CMET, Mg, Phos on 6/14 Monitor TPN labs on Mon/Thurs  Thank you for involving pharmacy in this patient's care.  Dimple Nanas, PharmD 03/04/2021 7:38 AM  **Pharmacist phone directory can be found on [email or web address] listed under [place]**

## 2021-03-04 NOTE — Op Note (Addendum)
[facility] Patient Name: [name] [name] Procedure Date : 03/04/2021 MRN: [id number] Attending MD: Justice Britain , MD Date of Birth: 06/04/1927 CSN: [id number] Age: 87 Admit Type: Inpatient Procedure:                Upper EUS Indications:              Suspected mass in duodenum on CT scan, Nausea with                            vomiting, Abnormal thickening of the duodenum with                            concern for extrinsic impression Providers:                Justice Britain, MD, Kary Kos RN, RN,                            Lesia Sago, Technician Referring MD:             [facility], Varnita B. Bonna Gains MD, MD,                            Pricilla Riffle. Fuller Plan, MD Medicines:                Monitored Anesthesia Care Complications:            No immediate complications. Estimated Blood Loss:     Estimated blood loss was minimal. Procedure:                Pre-Anesthesia Assessment:                           - Prior to the procedure, a History and Physical                            was performed, and patient medications and                            allergies were reviewed. The patient's tolerance of                            previous anesthesia was also reviewed. The risks                            and benefits of the procedure and the sedation                            options and risks were discussed with the patient.                            All questions were answered, and informed consent                            was obtained. Prior Anticoagulants: The patient has  taken no previous anticoagulant or antiplatelet                            agents. ASA Grade Assessment: III - A patient with                            severe systemic disease. After reviewing the risks                            and benefits, the patient was deemed in                            satisfactory condition to undergo the procedure.                            After obtaining informed consent, the endoscope was                            passed under direct vision. Throughout the                            procedure, the patient's blood pressure, pulse, and                            oxygen saturations were monitored continuously. The                            PCF-PH190L ([id number]) Olympus ultra slim colonoscope                            was introduced through the mouth, and advanced to                            the proximal jejunum. The TJF- Q180V ([id number])                            Olympus duodenoscope was introduced through the                            mouth, and advanced to the second part of duodenum.                            The [id number] ([id number]) Olympus Linear EUS was                            introduced through the mouth, and advanced to the                            duodenum for ultrasound examination from the                            stomach and duodenum. After obtaining informed  consent, the endoscope was passed under direct                            vision. Throughout the procedure, the patient's                            blood pressure, pulse, and oxygen saturations were                            monitored continuously. The TGF-[id number] ([id number])                            Olympus forward view EUS scope was introduced                            through the mouth, and advanced to the duodenum for                            ultrasound examination. The upper EUS was                            accomplished without difficulty. The patient                            tolerated the procedure. Scope In: Scope Out: Findings:      ENDOSCOPIC FINDING: :      No gross lesions were noted in the entire esophagus.      The Z-line was regular and was found 38 cm from the incisors.      A distension deformity was found in the entire examined stomach. No       significant amount of foodstuffs  noted.      Few non-bleeding linear gastric ulcers with a clean ulcer base (Forrest       Class III) were found in the cardia and on the greater curvature of the       stomach. The largest lesion was 15 mm in largest dimension. These look       to be related to NG trauma.      Patchy mildly erythematous mucosa without bleeding was found in the       entire examined stomach.      No gross lesions were noted in the duodenal bulb.      The major papilla and minor papilla were normal.      An acquired moderate stenosis was found in the second/third portion of       the duodenum and was able to be traversed. This areas mucosa was       erythematous and inflammatory in appearance. Biopsies were taken with a       cold forceps for histology.      A small non-bleeding diverticulum was found in the second/third portion       of the duodenum within this region noted above of stenosis. It did not       look particularly inflammed however.      No gross lesions were noted in the third portion of the duodenum and in       the fourth portion of the duodenum.  The examined proximal jejunum was normal.      ENDOSONOGRAPHIC FINDING: :      Pancreatic parenchymal abnormalities were noted in the entire pancreas.       These consisted of hyperechoic strands.      The pancreatic duct had a normal endosonographic appearance in the       pancreatic head (1.2 mm -> 2.6 mm), genu of the pancreas (2.5 mm ->1.5       mm), body of the pancreas (1.5 mm) and tail of the pancreas (1.0 mm).      No evidence of a pancreatic mass noted in regions evaluated but due to       the narrowing noted above uncinate process was not able to be visualized.      Endosonographic imaging of the ampulla showed no extrinsic compression,       intramural (subepithelial) lesion, mass, varices or wall thickening.      Localized wall thickening was visualized endosonographically in the       second/third portion of the duodenum. This  appeared to primarily be due       to thickening within the luminal interface/superficial mucosa (Layer 1),       deep mucosa (Layer 2) and submucosa (Layer 3). The thickness of the       abnormal layers. The duodenal wall measured up to 2 mm in thickness.      No malignant-appearing lymph nodes were visualized in the celiac region       (level 20), peripancreatic region and porta hepatis region.      Endosonographic imaging in the visualized portion of the liver showed no       mass.      The celiac region was visualized.      At conclusion of procedure, an 18 Fr nasogastric tube was placed through       the nares into the esophagus. Under endoscopic guidance, the tube was       advanced into the stomach. Placement was confirmed by scope       visualization. Impression:               EGD Impression:                           - No gross lesions in esophagus. Z-line regular, 38                            cm from the incisors.                           - Distention deformity in the entire stomach.                            Non-bleeding gastric ulcers with a clean ulcer base                            (Forrest Class III) - likely NGT trauma.                            Erythematous mucosa in the stomach.                           -  No gross lesions in the duodenal bulb. Normal                            major papilla and minor papilla.                           - Acquired duodenal stenosis with abnormal                            inflammed mucosa at D2/D3 region. Biopsied.                           - Non-bleeding duodenal diverticulum noted in D2/D3                            region of inflammation.                           - No gross lesions in the third portion of the                            duodenum and in the fourth portion of the duodenum.                           - Normal examined proximal jejunum.                           EUS Impression:                           - Pancreatic  parenchymal abnormalities consisting                            of hyperechoic strands were noted in the entire                            pancreas.                           - The pancreatic duct had a normal endosonographic                            appearance in the pancreatic head, genu of the                            pancreas, body of the pancreas and tail of the                            pancreas.                           - Wall thickening was seen in the second portion of                            the duodenum and in the third portion of the  duodenum. It appeared to primarily be within the                            luminal interface/superficial mucosa (Layer 1),                            deep mucosa (Layer 2) and submucosa (Layer 3).                           - No malignant-appearing lymph nodes were                            visualized in the celiac region (level 20),                            peripancreatic region and porta hepatis region.                           - At completion of procedure, a 52F NG feeding tube                            placement was successfully performed. Recommendation:           - The patient will be observed post-procedure,                            until all discharge criteria are met.                           - Return patient to hospital ward for ongoing care.                           - NPO today.                           - Observe patient's clinical course.                           - If NGT output is minimal into tomorrow may trial                            clear liquid diet.                           - Query possibility of underlying Small Bowel                            Diverticulitis. Will start Cipro/Flagyl for                            treatment and see how she does.                           - Suspect, patient will need Surgical evaluation,  if this does not improve with  antibiotic therapy,                            so depending on how things are into the beginning                            of this week would consider consultation.                           - Await path results.                           - The findings and recommendations were discussed                            with the patient.                           - The findings and recommendations were discussed                            with the patient's family.                           - The findings and recommendations were discussed                            with the referring physician. Procedure Code(s):        --- Professional ---                           [phone], Esophagogastroduodenoscopy, flexible,                            transoral; with endoscopic ultrasound examination                            limited to the esophagus, stomach or duodenum, and                            adjacent structures                           43241, Esophagogastroduodenoscopy, flexible,                            transoral; with insertion of intraluminal tube or                            catheter                           43239, Esophagogastroduodenoscopy, flexible,                            transoral; with biopsy, single or multiple Diagnosis Code(s):        --- Professional ---  K31.89, Other diseases of stomach and duodenum                           K25.9, Gastric ulcer, unspecified as acute or                            chronic, without hemorrhage or perforation                           K31.5, Obstruction of duodenum                           K86.9, Disease of pancreas, unspecified                           I89.9, Noninfective disorder of lymphatic vessels                            and lymph nodes, unspecified                           R11.2, Nausea with vomiting, unspecified                           K57.10, Diverticulosis of small intestine without                             perforation or abscess without bleeding                           R93.3, Abnormal findings on diagnostic imaging of                            other parts of digestive tract CPT copyright 2019 [employer]. All rights reserved. The codes documented in this report are preliminary and upon coder review may  be revised to meet current compliance requirements. Justice Britain, MD 03/04/2021 4:06:44 PM Number of Addenda: 0

## 2021-03-04 NOTE — Progress Notes (Signed)
[facility] Progress Note     Subjective: CC:   NAEO. NG in place. Patient denies pain. Denies flatus/BM. Patients daughter is at bedside. The patient states she does not think she will want surgery for this. Confirms she was previously living in her home independently and had some help around the house when needed from her family.  Objective: Vital signs in last 24 hours: Temp:  [97.7 F (36.5 C)-98 F (36.7 C)] 97.7 F (36.5 C) (06/13 0835) Pulse Rate:  [68-128] 119 (06/13 0835) Resp:  [18-23] 20 (06/13 0835) BP: (99-139)/(40-102) 121/40 (06/13 0835) SpO2:  [94 %-100 %] 100 % (06/13 0835) Weight:  [70.5 kg] 70.5 kg (06/13 0343)    Intake/Output from previous day: 06/12 0701 - 06/13 0700 In: 480 [I.V.:480] Out: 2100 [Urine:800; Emesis/NG output:1300] Intake/Output this shift: No intake/output data recorded.  PE: Gen:  Alert, NAD, pleasant Card:  Regular rate and rhythm, pedal pulses 2+ BL Pulm:  Normal effort, clear to auscultation bilaterally Abd: Soft, non-tender, mild distention, NG in place with bilious output in cannister (1300 cc/24h) Skin: warm and dry, no rashes  Psych: A&Ox3   Lab Results:  Recent Labs    03/02/21 1748 03/04/21 0500  WBC 9.9 10.6*  HGB 13.8 12.9  HCT 41.8 40.0  PLT 161 142*   BMET Recent Labs    03/03/21 0057 03/04/21 0500  NA 144 143  K 3.6 3.5  CL 105 103  CO2 25 32  GLUCOSE 121* 144*  BUN 34* 29*  CREATININE 1.35* 1.27*  CALCIUM 9.1 9.2   PT/INR Recent Labs    03/02/21 0514 03/02/21 1748  LABPROT 17.5* 19.2*  INR 1.4* 1.6*   CMP     Component Value Date/Time   NA 143 03/04/2021 0500   K 3.5 03/04/2021 0500   CL 103 03/04/2021 0500   CO2 32 03/04/2021 0500   GLUCOSE 144 (H) 03/04/2021 0500   BUN 29 (H) 03/04/2021 0500   CREATININE 1.27 (H) 03/04/2021 0500   CALCIUM 9.2 03/04/2021 0500   PROT 5.7 (L) 03/04/2021 0500   ALBUMIN 2.6 (L) 03/04/2021 0500   AST 20 03/04/2021 0500   ALT 10 03/04/2021  0500   ALKPHOS 58 03/04/2021 0500   BILITOT 1.8 (H) 03/04/2021 0500   GFRNONAA 40 (L) 03/04/2021 0500   GFRAA 60 (L) 08/10/2018 1049   Lipase     Component Value Date/Time   LIPASE 44 02/26/2021 1305       Studies/Results: DG Chest Port 1 View  Result Date: 03/03/2021 CLINICAL DATA:  Peripherally inserted central catheter (PICC) central line placement EXAM: PORTABLE CHEST 1 VIEW COMPARISON:  None. FINDINGS: There is a right upper extremity approach PICC with tip overlying the superior cavoatrial junction. Unchanged dual chamber pacemaker leads and enlarged cardiomediastinal silhouette with tortuous thoracic aorta. Aortic calcifications. Right basilar subsegmental atelectasis. No other new focal airspace disease. There is no large pleural effusion or visible pneumothorax. There is an anatomic right shoulder arthroplasty. No acute osseous abnormality. IMPRESSION: Right upper extremity PICC tip overlies the superior cavoatrial junction. Right basilar subsegmental atelectasis. Electronically Signed   By: Maurine Simmering   On: 03/03/2021 12:32   [place] EKG SITE RITE  Result Date: 03/03/2021 If Site Rite image not attached, placement could not be confirmed due to current cardiac rhythm.   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 GI consulted 6/12 and tentatively planning repeat EGD +/- EUS in next 1-2 days. - palliative care consulted to initiate goals of care discussions - will follow their discussions. Patient states is not sure if she would want to undergo any surgery, per her daughter they would  likely opt for non-operative management. - Pre-albumin is 9.1. continue PICC/TPN. - We will follow, surgical intervention would likely be an open palliative gastrojejunostomy.   ID - none VTE - hold coumadin, ok for IV heparin or chemical dvt prophylaxis per primary FEN - IVF, NPO/NGT to LIWS Foley - none   Atrial fibrillation with RVR COPD Chronic diastolic CHF CAD HTN HLD SSS s/p PPM GERD CKD stage IIIa Code status DNR   LOS: 2 days    Obie Dredge, [facility] Please see Amion for pager number during day hours 7:00am-4:30pm

## 2021-03-04 NOTE — Transfer of Care (Signed)
Immediate Anesthesia Transfer of Care Note  Patient: [name] [name]  Procedure(s) Performed: ESOPHAGOGASTRODUODENOSCOPY (EGD) WITH PROPOFOL UPPER ESOPHAGEAL ENDOSCOPIC ULTRASOUND (EUS) BIOPSY  Patient Location: PACU  Anesthesia Type:General  Level of Consciousness: drowsy and confused  Airway & Oxygen Therapy: Patient Spontanous Breathing  Post-op Assessment: Report given to RN and Post -op Vital signs reviewed and stable  Post vital signs: Reviewed and stable  Last Vitals:  Vitals Value Taken Time  BP 169/62 03/04/21 1548  Temp    Pulse 100 03/04/21 1553  Resp 16 03/04/21 1553  SpO2 99 % 03/04/21 1553  Vitals shown include unvalidated device data.  Last Pain:  Vitals:   03/04/21 1324  TempSrc:   PainSc: 0-No pain         Complications: No notable events documented.

## 2021-03-04 NOTE — Progress Notes (Signed)
PROGRESS NOTE    [name] [name]  JSH:[id number] DOB: 10-23-1926 DOA: 03/02/2021 PCP: Tracie Harrier, MD    Brief Narrative:  [name] [name] is a 87 y.o. female with medical history significant of essential hypertension, COPD, CKD stage IIIa, SSS s/p PPM, paroxysmal atrial fibrillation on Coumadin, CAD, chronic diastolic congestive heart failure, hyperlipidemia, GERD, peripheral vascular disease, distant history of breast cancer who presents as a transfer from [facility] for partial gastric outlet obstruction and duodenal mass.   HPI obtained from patient and patient's daughter who is present at bedside.  Apparently, patient was experiencing nausea/vomiting and abdominal pain with associated distention.  Symptoms apparently progressed over 2-week, and subsequently sought care at [facility] ED on 02/26/2021.  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  NG tube was placed to low wall intermittent suction.  Gastroenterology and general surgery were consulted and patient underwent EGD with findings of thickened folds mucosa in the antrum, extrinsic deformity second portion of duodenum s/p biopsy, no obvious mass.  General surgery recommended potential surgical options with biliary stent versus gastrojejunostomy. GI recommending further evaluation with MRI/MRCP, given patient with pacemaker present, GI recommended transfer to [facility] where MRI can be performed safely with pacemaker in place.  At baseline, patient is independent of ADLs, uses a cane occasionally for ambulation and lives by herself with a family member staying with her at night.   Assessment & Plan:   Principal Problem:   Partial gastric outlet obstruction Active Problems:   Essential (primary) hypertension   Pacemaker   Atrial fibrillation with RVR (HCC)    COPD (chronic obstructive pulmonary disease) (HCC)   Abdominal pain   CKD (chronic kidney disease), stage IIIa   Duodenal mass   Partial gastric outlet obstruction Biliary obstruction Ampullary vs duodenal mass Patient presenting as a transfer from [facility] with 2-week history of progressive nausea/vomiting and abdominal pain with associated distention.  Total bilirubin elevated 2.1.  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 EGD with findings of thickened folds mucosa in the antrum, extrinsic deformity second portion of duodenum s/p biopsy, no obvious mass.  GI and general surgery recommending MRI/MRCP, which cannot be formed at [facility] apparently. [facility] MRI tech states confirmed with [employer] her pacemaker leads placed in 2009 are not compatible with MRI; so unable to obtain MRI/MRCP --[facility]/General surgery following, appreciate assistance --Palliative care consultation for assistance with goals of care discussions --CEA and CA 19-9 pending --N.p.o., okay for ice chips --TPN started 6/12 --NG tube to low wall intermittent suction, monitor output --CMP daily --Pending EGD w/ possible EUS today --Per general surgery, if needs surgical procedure, would be an open palliative gastrojejunostomy   Atrial fibrillation with RVR Prior to EGD at [facility], patient's heart rate became elevated.  Given her n.p.o. status, she was placed on a Cardizem drip.  Home medication includes warfarin and metoprolol succinate 50 mg p.o. daily. --Cardizem drip, now off --Metoprolol 5 mg IV every 6 hours; hold for HR <60 or SBP <100 --Holding warfarin for possible need of surgical/endoscopic intervention --Monitor on telemetry   COPD On Symbicort, Singulair, Spiriva and albuterol MDI as needed at home. --Dulera 2 puffs twice daily as hospital substitution --Incruse  Ellipta 1 puff daily as hospital substitution --DuoNebs every 6 hours as needed for shortness of breath/wheezing   Chronic diastolic congestive heart failure, compensated On metoprolol succinate 50 mg p.o. daily and furosemide 40 mg p.o. daily at baseline. --Holding oral medications while n.p.o. --Strict I's and O's Daily weights   SSS s/p PPM Patient underwent recent pacemaker generator change on 01/11/2020.  Current generator is listed as aPACE AZURE XT DR MRI, her pacemaker leads were placed in 2009 which were not MRI compatible, per MRI tech. --Continue monitor on telemetry   GERD: Holding home PPI while n.p.o.   CKD stage IIIa: Creatinine baseline 1.3. --Cr 1.46>1.52>1.51>1.35>1.27 --Avoid nephrotoxins, renally dose all medications --IV fluid hydration as above, starting TPN --Monitor function closely daily   DVT prophylaxis: Place and maintain sequential compression device Start: 03/02/21 1804    Code Status: DNR Family Communication: Updated daughter present at bedside this morning  Disposition Plan:  Level of care: Telemetry Medical Status is: Inpatient  Remains inpatient appropriate because:Ongoing diagnostic testing needed not appropriate for outpatient work up, Unsafe d/c plan, IV treatments appropriate due to intensity of illness or inability to take PO, and Inpatient level of care appropriate due to severity of illness  Dispo: The patient is from: Home              Anticipated d/c is to: Home              Patient currently is not medically stable to d/c.   Difficult to place patient No   Consultants:  [facility], Dr. Fuller Plan, Dr. [facility] Palliative care  Procedures:  EGD - performed [facility]  Antimicrobials:  None   Subjective: Patient seen examined at bedside, resting comfortably.  Daughter present.  Patient feels tired and fatigued.  Started on TPN yesterday.  Awaiting EGD later today.  Continues with bilious drainage from NG tube.  No  other questions or concerns at this time. Patient denies headache, no visual changes, no fever/chills/night sweats, no nausea/vomiting/diarrhea, no chest pain, no palpitations, no shortness of breath, no current abdominal distention, no weakness, no fatigue, no paresthesias.  No acute events overnight per nursing staff.  Objective: Vitals:   03/04/21 0343 03/04/21 0447 03/04/21 0832 03/04/21 0835  BP: 104/64   (!) 121/40  Pulse: 88 (!) 120  (!) 119  Resp: 20 20  20   Temp: 97.9 F (36.6 C) 98 F (36.7 C)  97.7 F (36.5 C)  TempSrc: Oral Oral  Oral  SpO2: 96% 98% 98% 100%  Weight: 70.5 kg       Intake/Output Summary (Last 24 hours) at 03/04/2021 0941 Last data filed at 03/04/2021 0659 Gross per 24 hour  Intake 480 ml  Output 2100 ml  Net -1620 ml   Filed Weights   03/03/21 0345 03/04/21 0343  Weight: 77.9 kg 70.5 kg    Examination:  General exam: Appears calm and comfortable  Respiratory system: Clear to auscultation. Respiratory effort normal.  On room air Cardiovascular system: S1 & S2 heard, tachycardic, irregularly irregular rhythm no JVD, murmurs, rubs, gallops or clicks. No pedal edema. Gastrointestinal system: Abdomen is nondistended, soft, and nontender. No organomegaly or masses felt. Normal bowel sounds heard.  NG tube noted with bilious drainage Central nervous system: Alert and oriented. No focal neurological deficits. Extremities: Symmetric 5 x 5 power. Skin: No rashes, lesions or ulcers Psychiatry: Judgement and insight appear normal. Mood & affect appropriate.     Data Reviewed: I have personally  reviewed following labs and imaging studies  CBC: Recent Labs  Lab 02/27/21 0429 02/28/21 0404 03/01/21 0500 03/02/21 1748 03/04/21 0500  WBC 7.7 9.0 8.7 9.9 10.6*  NEUTROABS  --   --   --   --  8.0*  HGB 13.6 14.2 14.4 13.8 12.9  HCT 40.4 43.0 43.8 41.8 40.0  MCV 93.1 95.8 96.5 95.4 97.8  PLT 178 169 162 161 440*   Basic Metabolic Panel: Recent Labs   Lab 02/27/21 0429 02/28/21 0404 03/01/21 0500 03/02/21 1748 03/03/21 0057 03/03/21 1004 03/04/21 0500  NA 140 143 146* 143 144  --  143  K 4.5 4.3 3.9 3.5 3.6  --  3.5  CL 102 103 103 103 105  --  103  CO2 26 29 29 29 25   --  32  GLUCOSE 119* 95 79 130* 121*  --  144*  BUN 35* 38* 43* 35* 34*  --  29*  CREATININE 1.46* 1.52* 1.51* 1.42* 1.35*  --  1.27*  CALCIUM 9.8 9.9 9.7 9.0 9.1  --  9.2  MG 2.2 2.2  --   --   --  2.0 2.0  PHOS  --  4.9*  --   --   --  3.1 2.2*   GFR: Estimated Creatinine Clearance: 27.1 mL/min (A) (by C-G formula based on SCr of 1.27 mg/dL (H)). Liver Function Tests: Recent Labs  Lab 02/28/21 0404 03/01/21 0500 03/02/21 1748 03/03/21 0057 03/04/21 0500  AST 27 28 28 26 20   ALT 15 16 13 10 10   ALKPHOS 75 59 61 58 58  BILITOT 1.8*  1.8* 2.1* 2.2* 1.8* 1.8*  PROT 7.4 6.9 5.9* 5.4* 5.7*  ALBUMIN 4.2 3.7 3.0* 2.8* 2.6*   Recent Labs  Lab 02/26/21 1305  LIPASE 44   No results for input(s): AMMONIA in the last 168 hours. Coagulation Profile: Recent Labs  Lab 02/27/21 0429 02/28/21 0404 03/01/21 0500 03/02/21 0514 03/02/21 1748  INR 2.4* 2.3* 1.5* 1.4* 1.6*   Cardiac Enzymes: No results for input(s): CKTOTAL, CKMB, CKMBINDEX, TROPONINI in the last 168 hours. BNP (last 3 results) No results for input(s): PROBNP in the last 8760 hours. HbA1C: No results for input(s): HGBA1C in the last 72 hours. CBG: Recent Labs  Lab 03/03/21 0608 03/03/21 1245 03/03/21 1801 03/04/21 0015 03/04/21 0611  GLUCAP 140* 124* 121* 132* 153*   Lipid Profile: Recent Labs    03/03/21 1004  TRIG 100   Thyroid Function Tests: No results for input(s): TSH, T4TOTAL, FREET4, T3FREE, THYROIDAB in the last 72 hours. Anemia Panel: No results for input(s): VITAMINB12, FOLATE, FERRITIN, TIBC, IRON, RETICCTPCT in the last 72 hours. Sepsis Labs: No results for input(s): PROCALCITON, LATICACIDVEN in the last 168 hours.  Recent Results (from the past 240  hour(s))  SARS CORONAVIRUS 2 (TAT 6-24 HRS) Nasopharyngeal Nasopharyngeal Swab     Status: None   Collection Time: 02/26/21  2:32 PM   Specimen: Nasopharyngeal Swab  Result Value Ref Range Status   SARS Coronavirus 2 NEGATIVE NEGATIVE Final    Comment: (NOTE) SARS-CoV-2 target nucleic acids are NOT DETECTED.  The SARS-CoV-2 RNA is generally detectable in upper and lower respiratory specimens during the acute phase of infection. Negative results do not preclude SARS-CoV-2 infection, do not rule out co-infections with other pathogens, and should not be used as the sole basis for treatment or other patient management decisions. Negative results must be combined with clinical observations, patient history, and epidemiological information. The expected result is  Negative.  Fact Sheet for Patients: SugarRoll.be  Fact Sheet for Healthcare Providers: https://www.woods-mathews.com/  This test is not yet approved or cleared by the [place] FDA and  has been authorized for detection and/or diagnosis of SARS-CoV-2 by FDA under an Emergency Use Authorization (EUA). This EUA will remain  in effect (meaning this test can be used) for the duration of the COVID-19 declaration under Se ction 564(b)(1) of the Act, 21 U.S.C. section 360bbb-3(b)(1), unless the authorization is terminated or revoked sooner.  Performed at [facility], [place] [address], [place], [place] [address]          Radiology Studies: DG Chest Port 1 View  Result Date: 03/03/2021 CLINICAL DATA:  Peripherally inserted central catheter (PICC) central line placement EXAM: PORTABLE CHEST 1 VIEW COMPARISON:  None. FINDINGS: There is a right upper extremity approach PICC with tip overlying the superior cavoatrial junction. Unchanged dual chamber pacemaker leads and enlarged cardiomediastinal silhouette with tortuous thoracic aorta. Aortic calcifications. Right basilar subsegmental  atelectasis. No other new focal airspace disease. There is no large pleural effusion or visible pneumothorax. There is an anatomic right shoulder arthroplasty. No acute osseous abnormality. IMPRESSION: Right upper extremity PICC tip overlies the superior cavoatrial junction. Right basilar subsegmental atelectasis. Electronically Signed   By: Maurine Simmering   On: 03/03/2021 12:32   [place] EKG SITE RITE  Result Date: 03/03/2021 If Site Rite image not attached, placement could not be confirmed due to current cardiac rhythm.       Scheduled Meds:  Chlorhexidine Gluconate Cloth  6 each Topical Daily   insulin aspart  0-9 Units Subcutaneous Q6H   metoprolol tartrate  5 mg Intravenous Q6H   mometasone-formoterol  2 puff Inhalation BID   pantoprazole (PROTONIX) IV  40 mg Intravenous Q24H   sodium chloride flush  10-40 mL Intracatheter Q12H   umeclidinium bromide  1 puff Inhalation Daily   Continuous Infusions:  diltiazem (CARDIZEM) infusion Stopped (03/03/21 1900)   TPN ADULT (ION) 40 mL/hr at 03/04/21 0659     LOS: 2 days    Time spent: 41 minutes spent on chart review, discussion with nursing staff, consultants, updating family and interview/physical exam; more than 50% of that time was spent in counseling and/or coordination of care.    [name] Nappier J [place] DO [facility] Available via Epic secure chat 7am-7pm After these hours, please refer to coverage provider listed on [email or web address] 03/04/2021, 9:41 AM

## 2021-03-04 NOTE — Anesthesia Procedure Notes (Signed)
Procedure Name: Intubation Date/Time: 03/04/2021 2:24 PM Performed by: Georgia Duff, CRNA Pre-anesthesia Checklist: Patient identified, Emergency Drugs available, Suction available and Patient being monitored Patient Re-evaluated:Patient Re-evaluated prior to induction Oxygen Delivery Method: Circle System Utilized Preoxygenation: Pre-oxygenation with 100% oxygen Induction Type: IV induction Ventilation: Mask ventilation without difficulty Laryngoscope Size: Miller and 2 Tube type: Oral Tube size: 7.0 mm Number of attempts: 1 Airway Equipment and Method: Stylet and Oral airway Placement Confirmation: ETT inserted through vocal cords under direct vision, positive ETCO2 and breath sounds checked- equal and bilateral Secured at: 21 cm Tube secured with: Tape Dental Injury: Teeth and Oropharynx as per pre-operative assessment

## 2021-03-04 NOTE — Anesthesia Postprocedure Evaluation (Signed)
Anesthesia Post Note  Patient: [name] [name]  Procedure(s) Performed: ESOPHAGOGASTRODUODENOSCOPY (EGD) WITH PROPOFOL UPPER ESOPHAGEAL ENDOSCOPIC ULTRASOUND (EUS) BIOPSY     Patient location during evaluation: Endoscopy Anesthesia Type: MAC Level of consciousness: awake and alert and patient cooperative Pain management: pain level controlled Vital Signs Assessment: post-procedure vital signs reviewed and stable Respiratory status: spontaneous breathing, nonlabored ventilation, respiratory function stable and patient connected to nasal cannula oxygen Cardiovascular status: stable and blood pressure returned to baseline Postop Assessment: no apparent nausea or vomiting Anesthetic complications: no   No notable events documented.  Last Vitals:  Vitals:   03/04/21 1548 03/04/21 1600  BP: (!) 169/62 (!) 115/40  Pulse: (!) 130 69  Resp: (!) 23 19  Temp: 36.7 C   SpO2: 100% 93%    Last Pain:  Vitals:   03/04/21 1600  TempSrc:   PainSc: 0-No pain                 Cyrena Kuchenbecker,E. Shaakira Borrero

## 2021-03-05 ENCOUNTER — Encounter: Payer: Self-pay | Admitting: Gastroenterology

## 2021-03-05 DIAGNOSIS — K5712 Diverticulitis of small intestine without perforation or abscess without bleeding: Secondary | ICD-10-CM

## 2021-03-05 DIAGNOSIS — Z7189 Other specified counseling: Secondary | ICD-10-CM

## 2021-03-05 LAB — COMPREHENSIVE METABOLIC PANEL
ALT: 11 U/L (ref 0–44)
AST: 17 U/L (ref 15–41)
Albumin: 2.3 g/dL — ABNORMAL LOW (ref 3.5–5.0)
Alkaline Phosphatase: 47 U/L (ref 38–126)
Anion gap: 7 (ref 5–15)
BUN: 30 mg/dL — ABNORMAL HIGH (ref 8–23)
CO2: 26 mmol/L (ref 22–32)
Calcium: 8.6 mg/dL — ABNORMAL LOW (ref 8.9–10.3)
Chloride: 108 mmol/L (ref 98–111)
Creatinine, Ser: 1.14 mg/dL — ABNORMAL HIGH (ref 0.44–1.00)
GFR, Estimated: 45 mL/min — ABNORMAL LOW (ref >60)
Glucose, Bld: 115 mg/dL — ABNORMAL HIGH (ref 70–99)
Potassium: 3.9 mmol/L (ref 3.5–5.1)
Sodium: 141 mmol/L (ref 135–145)
Total Bilirubin: 0.8 mg/dL (ref 0.3–1.2)
Total Protein: 5.1 g/dL — ABNORMAL LOW (ref 6.5–8.1)

## 2021-03-05 LAB — GLUCOSE, CAPILLARY
Glucose-Capillary: 101 mg/dL — ABNORMAL HIGH (ref 70–99)
Glucose-Capillary: 105 mg/dL — ABNORMAL HIGH (ref 70–99)
Glucose-Capillary: 117 mg/dL — ABNORMAL HIGH (ref 70–99)
Glucose-Capillary: 123 mg/dL — ABNORMAL HIGH (ref 70–99)

## 2021-03-05 LAB — MAGNESIUM: Magnesium: 1.9 mg/dL (ref 1.7–2.4)

## 2021-03-05 LAB — PHOSPHORUS: Phosphorus: 3 mg/dL (ref 2.5–4.6)

## 2021-03-05 MED ORDER — MORPHINE SULFATE (PF) 2 MG/ML IV SOLN: 1.0000 mg | INTRAVENOUS | Status: DC | PRN

## 2021-03-05 MED ORDER — TRAVASOL 10 % IV SOLN: INTRAVENOUS | Status: DC

## 2021-03-05 MED ORDER — TRAVASOL 10 % IV SOLN
INTRAVENOUS | Status: DC
  Filled 2021-03-05: qty 960

## 2021-03-05 MED ORDER — SODIUM CHLORIDE 0.9 % IV BOLUS
1000.0000 mL | Freq: Once | INTRAVENOUS | Status: AC
  Administered 2021-03-05: 1000 mL via INTRAVENOUS

## 2021-03-05 MED ORDER — KETOROLAC TROMETHAMINE 15 MG/ML IJ SOLN
15.0000 mg | Freq: Four times a day (QID) | INTRAMUSCULAR | Status: DC | PRN
  Administered 2021-03-05 – 2021-03-09 (×5): 15 mg via INTRAVENOUS
  Filled 2021-03-05 (×6): qty 1

## 2021-03-05 NOTE — Plan of Care (Signed)

## 2021-03-05 NOTE — Consult Note (Signed)
Consultation Note Date: 03/05/2021   Patient Name: [name] [name]  DOB: May 13, 1927  MRN: [id number]  Age / Sex: 87 y.o., female  PCP: [name] Harrier, MD Referring Physician: [place] [name] J, DO  Reason for Consultation: Establishing goals of care and Psychosocial/spiritual support  HPI/Patient Profile: 87 y.o. female  with past medical history of essential hypertension, COPD, CKD stage IIIa, SSS s/p PPM, paroxysmal atrial fibrillation on Coumadin, CAD, chronic diastolic congestive heart failure, hyperlipidemia, GERD, peripheral vascular disease, distant history of breast cancer admitted on 03/02/2021 from [facility] with partial gastric outlet obstruction and duodenal mass.  NG tube was placed to low wall intermittent suction.  Gastroenterology and general surgery were consulted and patient underwent EGD and biopsy of duodenum. Palliative care has been consulted to provide support and assist with goals of care discussions as the patient and family are faced with potential treatment options and difficult decision making.  Clinical Assessment and Goals of Care:  I have reviewed medical records including EPIC notes, labs and imaging, received report from RN, assessed the patient and then met at the bedside along with her daughter [name] [name] to discuss diagnosis, prognosis, GOC, EOL wishes, disposition and options.  I introduced Palliative Medicine as specialized medical care for people living with serious illness. It focuses on providing relief from the symptoms and stress of a serious illness. The goal is to improve quality of life for both the patient and the family.  We discussed a brief life review of the patient and then focused on her current illness. [name] [name] has three daughters who alternate caring for her 10 days at a time. Her two local daughters provide evening/overnight care, while [name] [name] travels from [facility] monthly and  provides 24/7 care. [name] [name] also has a friend/caregiver who visits every morning and assists with housework/companionship. 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 She has enjoyed staying active throughout retirement with puzzles, painting, although [name] [name] states she has been "mostly sitting and lying around" recently. She ambulates with a cane at times. Her children, grandchildren, and great-grandchildren in [place] are very important to her.   I attempted to elicit values and goals of care important to the patient.   [name] [name] highly values her independence and her primary goal is to return home to care for herself as much as possible. She values humor and feels that her daughters spend too much time caring for her. She feels that has enough support from her friend. She hopes to improve enough to avoid SNF. [name] [name] and [name] [name] understand that this will depend on upcoming test results and additional input from GI/surgery.   The difference between aggressive medical intervention and comfort care was considered in light of the patient's goals of care.   Advanced directives, concepts specific to code status, artifical feeding and hydration, and rehospitalization were considered and discussed.  Hospice and Palliative Care services outpatient were explained and offered. [name] [name] finds the mentioning of hospice to be frightening, as "it makes you think of  death." She has not thought much about this in the past.    Discussed the importance of continued conversation with family and the medical providers regarding overall plan of care and treatment options, ensuring decisions are within the context of the patient's values and GOCs.    Questions and concerns were addressed.  Hard Choices booklet left for review. The family was encouraged to call with questions or concerns.  PMT will continue to support holistically.   PATIENT is primary Media planner. Next of kin are her 3 daughters.     SUMMARY OF RECOMMENDATIONS   -Continue current interventions, awaiting biopsy results and recommendations from specialists for further interventions -Daughter will obtain copy of patient's Living Will documentation. Discussed availability of notary/volunteer services for HCPOA -Ongoing discussions, psychosocial and emotional support from PMT  Code Status/Advance Care Planning: DNR  Palliative Prophylaxis:  Bowel Regimen and Delirium Protocol  Additional Recommendations (Limitations, Scope, Preferences): Full Scope Treatment  Psycho-social/Spiritual:  Desire for further Chaplaincy support: TBD Additional Recommendations: Referral to Community Resources   Prognosis:  Unable to determine  Discharge Planning: To Be Determined      Primary Diagnoses: Present on Admission:  Partial gastric outlet obstruction  Essential (primary) hypertension  Pacemaker  Atrial fibrillation with RVR (HCC)  Abdominal pain  CKD (chronic kidney disease), stage IIIa  COPD (chronic obstructive pulmonary disease) (HCC)  Duodenal mass   I have reviewed the medical record, interviewed the patient and family, and examined the patient. The following aspects are pertinent.  Past Medical History:  Diagnosis Date   Anemia    Arrhythmia    atrial fibrillation   Asthma    Breast cancer ([employer]) 1978   left   CAD (coronary artery disease)    CHF (congestive heart failure) (HCC)    COPD (chronic obstructive pulmonary disease) (HCC)    DVT (deep venous thrombosis) (HCC)    Esophageal stricture    Hemorrhoids    History of knee replacement, total    Hyperlipidemia    Hypertension    Ischemic cardiomyopathy    Osteoporosis    Paroxysmal atrial fibrillation (HCC)    Peripheral arterial disease (HCC)    Sick sinus syndrome ([place])    Social History   Socioeconomic History   Marital status: Widowed    Spouse name: Not on file   Number of children: Not on file   Years of education: Not on file    Highest education level: Not on file  Occupational History   Not on file  Tobacco Use   Smoking status: Former    Pack years: 0.00    Types: Cigarettes    Quit date: 12/05/1986    Years since quitting: 34.2   Smokeless tobacco: Never  Vaping Use   Vaping Use: Never used  Substance and Sexual Activity   Alcohol use: No    Alcohol/week: 0.0 standard drinks   Drug use: No   Sexual activity: Not on file  Other Topics Concern   Not on file  Social History Narrative   Not on file   Social Determinants of Health   Financial Resource Strain: Not on file  Food Insecurity: Not on file  Transportation Needs: Not on file  Physical Activity: Not on file  Stress: Not on file  Social Connections: Not on file   Family History  Problem Relation Age of Onset   Hypertension Father    Heart disease Father    Clotting disorder Father    Cancer Mother  breast   Heart disease Sister    Thyroid disease Sister    Diabetes Sister    Breast cancer Daughter        33's, twice diagnosed, double mastectomy   Breast cancer Daughter        54's   Scheduled Meds:  Chlorhexidine Gluconate Cloth  6 each Topical Daily   insulin aspart  0-9 Units Subcutaneous Q6H   metoprolol tartrate  5 mg Intravenous Q6H   mometasone-formoterol  2 puff Inhalation BID   pantoprazole (PROTONIX) IV  40 mg Intravenous Q12H   sodium chloride flush  10-40 mL Intracatheter Q12H   umeclidinium bromide  1 puff Inhalation Daily   Continuous Infusions:  ciprofloxacin 400 mg (03/04/21 1847)   diltiazem (CARDIZEM) infusion Stopped (03/03/21 0554)   metronidazole 500 mg (03/05/21 0452)   TPN ADULT (ION) 60 mL/hr at 03/04/21 1847   PRN Meds:.acetaminophen, ALPRAZolam, ipratropium-albuterol, ondansetron **OR** ondansetron (ZOFRAN) IV, sodium chloride flush Medications Prior to Admission:  Prior to Admission medications   Medication Sig Start Date End Date Taking? Authorizing Provider  acetaminophen (TYLENOL) 160  MG/5ML solution Take 20.3 mLs (650 mg total) by mouth every 6 (six) hours as needed for mild pain. 03/02/21   Nolberto Hanlon, MD  albuterol (VENTOLIN HFA) 108 (90 Base) MCG/ACT inhaler Inhale 2 puffs into the lungs every 4 (four) hours as needed for wheezing or shortness of breath. 03/02/21   Nolberto Hanlon, MD  ALPRAZolam Duanne Moron) 0.25 MG tablet Take 1 tablet (0.25 mg total) by mouth 3 (three) times daily as needed for anxiety. 03/02/21   Nolberto Hanlon, MD  dilTIAZem HCl-Dextrose 125-5 MG/125ML-% SOLN infusion Inject 5-15 mg/hr into the vein continuous. 03/02/21   Nolberto Hanlon, MD  ipratropium-albuterol (DUONEB) 0.5-2.5 (3) MG/3ML SOLN Take 3 mLs by nebulization every 6 (six) hours as needed. 03/02/21   Nolberto Hanlon, MD  lactated ringers infusion Inject 50 mLs into the vein continuous. 03/02/21   Nolberto Hanlon, MD  mometasone-formoterol (DULERA) 200-5 MCG/ACT AERO Inhale 2 puffs into the lungs 2 (two) times daily. 03/02/21   Nolberto Hanlon, MD  morphine 2 MG/ML injection Inject 0.25 mLs (0.5 mg total) into the vein every 3 (three) hours as needed. 03/02/21   Nolberto Hanlon, MD  nitroGLYCERIN (NITROSTAT) 0.4 MG SL tablet Place 1 tablet (0.4 mg total) under the tongue as directed. 03/02/21   Nolberto Hanlon, MD   Allergies  Allergen Reactions   Penicillins Anaphylaxis   Amoxicillin    Beta Adrenergic Blockers    Meloxicam     Other reaction(s): Unknown   Naproxen     Other reaction(s): Unknown   Nsaids     Other reaction(s): Unknown   Review of Systems  Constitutional:  Positive for appetite change (would like to eat/drink).  All other systems reviewed and are negative.  Physical Exam Vitals and nursing note reviewed.  Constitutional:      General: She is awake. She is not in acute distress.    Comments: NGT in place.  Cardiovascular:     Rate and Rhythm: Tachycardia present.  Pulmonary:     Effort: Pulmonary effort is normal.  Skin:    General: Skin is warm and dry.  Neurological:     Mental Status:  She is alert, oriented to person, place, and time and easily aroused.  Psychiatric:        Behavior: Behavior is cooperative.    Vital Signs: BP 93/60 (BP Location: Right Leg)   Pulse (!) 108  Temp 98 F (36.7 C) (Oral)   Resp 19   Wt 72.1 kg   SpO2 100%   BMI 28.16 kg/m  Pain Scale: 0-10   Pain Score: 0-No pain   SpO2: SpO2: 100 % O2 Device:SpO2: 100 % O2 Flow Rate: .O2 Flow Rate (L/min): 0 L/min  IO: Intake/output summary:  Intake/Output Summary (Last 24 hours) at 03/05/2021 1025 Last data filed at 03/05/2021 5573 Gross per 24 hour  Intake 1146.37 ml  Output 150 ml  Net 996.37 ml    LBM: Last BM Date: 02/26/21 Baseline Weight: Weight: 77.9 kg Most recent weight: Weight: 72.1 kg     Palliative Assessment/Data: 40%     Time In: 9:20am Time Out: 10:35am Time Total: 75 minutes Greater than 50% of this time was spent in counseling and coordinating care related to the above assessment and plan.  Dorthy Cooler, PA-C Palliative Medicine Team Team phone # [phone]  Thank you for allowing the Palliative Medicine Team to assist in the care of this patient. Please utilize secure chat with additional questions, if there is no response within 30 minutes please call the above phone number.  Palliative Medicine Team providers are available by phone from 7am to 7pm daily and can be reached through the team cell phone.  Should this patient require assistance outside of these hours, please call the patient's attending physician.

## 2021-03-05 NOTE — Progress Notes (Signed)
Daily Rounding Note  03/05/2021, 10:36 AM  LOS: 3 days   SUBJECTIVE:   Chief complaint:   GOO, duodenal diverticulitis?   NGT output: nothing recorded since 6/12 measure of 1.3 liters.  The dayshift RN today marked the canister at 500 mL and currently there are 600 mL measurable of dark bilious material in NG canister.  Reports passing flatus today.  No nausea.  No abdominal pain.  No BMs.  She complains that she has not had anything by mouth since her admission.  OBJECTIVE:         Vital signs in last 24 hours:    Temp:  [98 F (36.7 C)-99 F (37.2 C)] 98 F (36.7 C) (06/14 0747) Pulse Rate:  [66-130] 108 (06/14 1000) Resp:  [16-26] 19 (06/14 1000) BP: (90-169)/(35-96) 93/60 (06/14 1000) SpO2:  [92 %-100 %] 100 % (06/14 1000) Weight:  [72.1 kg] 72.1 kg (06/14 0347) Last BM Date: 02/26/21 Filed Weights   03/03/21 0345 03/04/21 0343 03/05/21 0347  Weight: 77.9 kg 70.5 kg 72.1 kg   General: Looks well for age and considering her medical issues.  Comfortable.  Not toxic appearing alert. Heart: RRR. Chest: Clear bilaterally without labored breathing or cough Abdomen: Soft.  Not tender.  Active bowel sounds.  No distention.  NG tube in place.  Canister contains deep brown bilious material. Extremities: No CCE. Neuro/Psych: Appropriate.  Alert and oriented x3.  Moves all 4 limbs.  Sitting at the side of the bed working with PT.  Intake/Output from previous day: 06/13 0701 - 06/14 0700 In: 1136.4 [I.V.:856.9; IV Piggyback:279.5] Out: 150 [Urine:150]  Intake/Output this shift: Total I/O In: 10 [I.V.:10] Out: -   Lab Results: Recent Labs    03/02/21 1748 03/04/21 0500  WBC 9.9 10.6*  HGB 13.8 12.9  HCT 41.8 40.0  PLT 161 142*   BMET Recent Labs    03/02/21 1748 03/03/21 0057 03/04/21 0500  NA 143 144 143  K 3.5 3.6 3.5  CL 103 105 103  CO2 29 25 32  GLUCOSE 130* 121* 144*  BUN 35* 34* 29*  CREATININE  1.42* 1.35* 1.27*  CALCIUM 9.0 9.1 9.2   LFT Recent Labs    03/02/21 1748 03/03/21 0057 03/04/21 0500  PROT 5.9* 5.4* 5.7*  ALBUMIN 3.0* 2.8* 2.6*  AST 28 26 20   ALT 13 10 10   ALKPHOS 61 58 58  BILITOT 2.2* 1.8* 1.8*   PT/INR Recent Labs    03/02/21 1748  LABPROT 19.2*  INR 1.6*   Hepatitis Panel No results for input(s): HEPBSAG, HCVAB, HEPAIGM, HEPBIGM in the last 72 hours.  Studies/Results: DG Chest Port 1 View  Result Date: 03/03/2021 CLINICAL DATA:  Peripherally inserted central catheter (PICC) central line placement EXAM: PORTABLE CHEST 1 VIEW COMPARISON:  None. FINDINGS: There is a right upper extremity approach PICC with tip overlying the superior cavoatrial junction. Unchanged dual chamber pacemaker leads and enlarged cardiomediastinal silhouette with tortuous thoracic aorta. Aortic calcifications. Right basilar subsegmental atelectasis. No other new focal airspace disease. There is no large pleural effusion or visible pneumothorax. There is an anatomic right shoulder arthroplasty. No acute osseous abnormality. IMPRESSION: Right upper extremity PICC tip overlies the superior cavoatrial junction. Right basilar subsegmental atelectasis. Electronically Signed   By: Maurine Simmering   On: 03/03/2021 12:32     Scheduled Meds:  Chlorhexidine Gluconate Cloth  6 each Topical Daily   insulin aspart  0-9 Units Subcutaneous Q6H  metoprolol tartrate  5 mg Intravenous Q6H   mometasone-formoterol  2 puff Inhalation BID   pantoprazole (PROTONIX) IV  40 mg Intravenous Q12H   sodium chloride flush  10-40 mL Intracatheter Q12H   umeclidinium bromide  1 puff Inhalation Daily   Continuous Infusions:  ciprofloxacin 400 mg (03/04/21 1847)   diltiazem (CARDIZEM) infusion Stopped (03/03/21 0554)   metronidazole 500 mg (03/05/21 0452)   TPN ADULT (ION) 60 mL/hr at 03/04/21 1847   PRN Meds:.acetaminophen, ALPRAZolam, ipratropium-albuterol, ondansetron **OR** ondansetron (ZOFRAN) IV, sodium  chloride flush  ASSESMENT:     GOO.  Duodenal mass per CT.   6/13 EGD/EUS w gastric ulcers (NGT trauma).  Duodenal stenosis with associated inflammation/erythema, biopsied..  No bleeding duodenal tic.  Hyperechoic strands within pancreas, O/w normal pancreas and pancreatic duct.  Thickened duodenal wall at D3.  No adenopathy. ? Underlying SB diverticulitis?L[place] Ham of cipro, flagyl added 6/13.  TPN as of 6/12.        Thrombocytopenia, mild, platelets 142.      Coagulopathy.  INR 1.6 overall improved from 2.6 one week ago.      Elevation T bili but otherwise normal LFTs: 2.2 >> 1.9      AKI, improved.   PLAN       Awaiting pathology from the duodenal biopsies.  Continue Cipro/Flagyl.  Reminded/reordered staff to strictly recorded ins and out so we can determine if and when NG tube can be clamped.    [name] [name]  03/05/2021, 10:36 AM Phone [phone]

## 2021-03-05 NOTE — Progress Notes (Signed)
PHARMACY - TOTAL PARENTERAL NUTRITION CONSULT NOTE   Indication:  partial gastric outlet obstruction  and duodenal mass  Patient Measurements: Weight: 72.1 kg (158 lb 15.2 oz)   Body mass index is 28.16 kg/m. Usual Weight: unknown  Assessment:  38 yof experiencing N/V and abdominal pain with distention progressing over two week period who presented to [facility] ED on 6/7. 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  NGT placed on LIWS. Surgery and GI consulted. EGD with findings of thickened folds mucosa in the antrum, extrinsic deformity second portion of duodenum s/p biopsy, no obvious mass. MRCP recommended but unable to be done due to PPM. Transferred to [facility] for continued work-up.  Spoke with patient and daughter, denies nausea and has been getting ice chips.    Glucose / Insulin: no hx DM, CBGs controlled Electrolytes: Na 141, K 3.9 (s/p Kcl IV x2 runs), Phos 3 (s/p Kphos 55mmol IV x1), Mg 1.9, CoCa 10.1 Renal: CKD, SCr 1.14 (Baseline 1.3), BUN 30 Hepatic: LFTs WNL, TG 100, Tbili normalized, albumin 2.3, prealbumin 9.1 Intake / Output; MIVF: UOP 800 ml, NG output 1300 ml, MIVF d/c'd GI Imaging: 6/7 CT A/P: dilation of the proximal duodenum which may represent duodenal mass  6/10 Abd XR: no bowel obstruction or free air, mod air in stomach GI Surgeries / Procedures:  6/10 EGD: biopsied thickened folds mucosa and duodenal deformity however no obvious mass  6/13 EGD: no lesions in esophagus; found non-bleeding gastric ulcers, duodenal stenosis w/ inflamed mucosa, and likely small bowel diverticulitis  Central access: triple lumen PICC placed 6/12 TPN start date: 03/03/21  Nutritional Goals (per RD recommendation 6/13): kCal: 1750-1950, Protein: 90-105 g, Fluid: >1.7L Goal TPN rate is 80 mL/hr (provides 95 g of protein and ~1900 kcals per  day)  Current Nutrition:  NPO and TPN  Plan:  Increase TPN to goal at 7mL/hr at 1800  Electrolytes in TPN: Na 58mEq/L, K 24mEq/L, Ca 36mEq/L, Mg 52mEq/L, and Phos 98mmol/L. Cl:Ac 1:1 Add standard MVI and trace elements to TPN Initiate Sensitive q6h SSI and adjust as needed  F/u BMET, Mg, and Phos 6/15 to ensure electrolytes are stable Monitor TPN labs on Mon/Thurs  Thank you for involving pharmacy in this patient's care.  Dimple Nanas, PharmD 03/05/2021 7:18 AM  **Pharmacist phone directory can be found on [email or web address] listed under [place]**

## 2021-03-05 NOTE — Evaluation (Signed)
Occupational Therapy Evaluation Patient Details Name: [name] [name] MRN: [id number] DOB: 03/18/1927 Today's Date: 03/05/2021    History of Present Illness Pt is a 87 y.o. female admitted 6/7 to [facility] with N/V abdominal pain and partial gastric outlet obstruction with duodenal mass. NG tube placed and pt underwent EGD with noted deformity of duodenum w/ biopsy though no obvious mass. 6/11 pt transferred to [facility], 6/13 endoscopy. PMhx: HTN, COPD, CKD, SSS s/p PPM, a fib, CAD, CHF, HLD, GERD, PVD, breast cancer.   Clinical Impression   PTA, pt lives alone and reports Modified Independence with ADLs and mobility. Pt has family that stays at night and sometimes in the morning to assist with IADLs/transportation as needed. Pt presents now with deficits in standing balance, endurance, and strength. Pt pleasant and witty, perseverating on desire for food/drink. Pt overall Min A for basic transfers, Setup for UB ADLs and up to Max A for LB ADLs due to deficits noted below. Pt fatigued with minimal activities with HR up to 150bpm during transfer. Daughter at bedside and supportive. Recommend SNF for short term rehab at this time but pt hopeful to progress to discharge home. Will progress endurance and educate on energy conservation strategies with ADLs, as well as possible trial of Rollator for implementation of these strategies.     Follow Up Recommendations  SNF;Supervision/Assistance - 24 hour    Equipment Recommendations  3 in 1 bedside commode    Recommendations for Other Services       Precautions / Restrictions Precautions Precautions: Fall;Other (comment) Precaution Comments: NG suction Restrictions Weight Bearing Restrictions: No      Mobility Bed Mobility Overal bed mobility: Needs Assistance Bed Mobility: Supine to Sit     Supine to sit: Supervision;HOB elevated     General bed mobility comments: supervision for line mgmt    Transfers Overall  transfer level: Needs assistance Equipment used: 1 person hand held assist Transfers: Sit to/from [employer] Sit to Stand: Min assist Stand pivot transfers: Min assist       General transfer comment: No RW in room so guided pt in taking steps to recliner chair via handheld assist, Min A for maintaining balance and cues for sequencing - difficulty taking steps    Balance Overall balance assessment: Needs assistance Sitting-balance support: No upper extremity supported;Feet supported Sitting balance-Leahy Scale: Fair     Standing balance support: Bilateral upper extremity supported;During functional activity Standing balance-Leahy Scale: Poor Standing balance comment: reliant on external support                           ADL either performed or assessed with clinical judgement   ADL Overall ADL's : Needs assistance/impaired Eating/Feeding: Set up;Sitting Eating/Feeding Details (indicate cue type and reason): ice chips only at time of eval Grooming: Set up;Sitting   Upper Body Bathing: Set up;Sitting   Lower Body Bathing: Sit to/from stand;Moderate assistance   Upper Body Dressing : Set up;Sitting   Lower Body Dressing: Maximal assistance;Sit to/from stand   Toilet Transfer: Minimal Scientist, forensic Details (indicate cue type and reason): simulated to recliner Toileting- Clothing Manipulation and Hygiene: Moderate assistance;Sit to/from stand         General ADL Comments: Limited by decreased endurance/strength, decreased PO intake and difficulty maintaining balance without external support     Vision Baseline Vision/History: Wears glasses Patient Visual Report: No change from baseline Vision Assessment?: No apparent visual deficits  Perception     Praxis      Pertinent Vitals/Pain Pain Assessment: No/denies pain     Hand Dominance Right   Extremity/Trunk Assessment Upper Extremity Assessment Upper  Extremity Assessment: Generalized weakness   Lower Extremity Assessment Lower Extremity Assessment: Defer to PT evaluation   Cervical / Trunk Assessment Cervical / Trunk Assessment: Kyphotic   Communication Communication Communication: No difficulties   Cognition Arousal/Alertness: Awake/alert Behavior During Therapy: WFL for tasks assessed/performed Overall Cognitive Status: Within Functional Limits for tasks assessed                                 General Comments: WFL, pleasant and joking. perseverating on desire for food, drink, etc but understands reasoning for current diet   General Comments  SpO2 WFL on RA. HR up to 140 sitting EOB, 150 during transfer. Daughter at bedside and supportive. BP 105/56 after transfer    Exercises     Shoulder Instructions      Home Living Family/patient expects to be discharged to:: Private residence Living Arrangements: Alone Available Help at Discharge: Family;Available PRN/intermittently Type of Home: House Home Access: Stairs to enter [employer] of Steps: 2 at back, but 4 through garage Entrance Stairs-Rails: Right;Left Home Layout: One level     Bathroom Shower/Tub: Tub/shower unit;Walk-in shower   Bathroom Toilet: Standard     Home Equipment: Environmental consultant - 2 wheels;Shower seat;Hand held shower head;Grab bars - tub/shower;Cane - single point   Additional Comments: Per daughter/pt, someone stays at night with pt and present during AM. Pt may be alone approx 5 hours during the day      Prior Functioning/Environment Level of Independence: Needs assistance  Gait / Transfers Assistance Needed: typically ambulatory without AD vs cane in the home, used RW at MD office recently and became fatigued/SOB with long distance mobility ADL's / Homemaking Assistance Needed: Typically modified Independent with ADLs, assist with IADLs - daughter/family assist with cooking, laundry, transportation and grocery shopping             OT Problem List: Decreased strength;Decreased activity tolerance;Impaired balance (sitting and/or standing);Decreased knowledge of use of DME or AE      OT Treatment/Interventions: Self-care/ADL training;Therapeutic exercise;Energy conservation;DME and/or AE instruction;Therapeutic activities;Patient/family education;Balance training    OT Goals(Current goals can be found in the care plan section) Acute Rehab OT Goals Patient Stated Goal: have some food/drink, feel better and go home OT Goal Formulation: With patient/family Time For Goal Achievement: 03/19/21 Potential to Achieve Goals: Good ADL Goals Pt Will Perform Grooming: with set-up;standing Pt Will Perform Lower Body Dressing: with min guard assist;sit to/from stand;sitting/lateral leans Pt Will Transfer to Toilet: with min guard assist;ambulating Pt Will Perform Toileting - Clothing Manipulation and hygiene: with min assist;sitting/lateral leans;sit to/from stand Pt/caregiver will Perform Home Exercise Program: Increased strength;Both right and left upper extremity;With theraband;With Supervision;With written HEP provided Additional ADL Goal #1: Pt to verbalize at least 3 energy conservation strategies to implement during ADLs/mobility  OT Frequency: Min 2X/week   Barriers to D/C:            Co-evaluation              AM-PAC OT "6 Clicks" Daily Activity     Outcome Measure Help from another person eating meals?: A Little Help from another person taking care of personal grooming?: A Little Help from another person toileting, which includes using toliet, bedpan, or urinal?: A  Lot Help from another person bathing (including washing, rinsing, drying)?: A Lot Help from another person to put on and taking off regular upper body clothing?: A Little Help from another person to put on and taking off regular lower body clothing?: A Lot 6 Click Score: 15   End of Session Equipment Utilized During Treatment: Gait  belt Nurse Communication: Mobility status;Other (comment) (HR, BP)  Activity Tolerance: Patient tolerated treatment well Patient left: in chair;with call bell/phone within reach;with chair alarm set;with family/visitor present  OT Visit Diagnosis: Unsteadiness on feet (R26.81);Other abnormalities of gait and mobility (R26.89);Muscle weakness (generalized) (M62.81)                Time: 1030-1107 OT Time Calculation (min): 37 min Charges:  OT General Charges $OT Visit: 1 Visit OT Evaluation $OT Eval Moderate Complexity: 1 Mod OT Treatments $Therapeutic Activity: 8-22 mins  Malachy Chamber, OTR/L Acute Rehab Services Office: [phone]   Layla Maw 03/05/2021, 11:32 AM

## 2021-03-05 NOTE — Evaluation (Signed)
Physical Therapy Evaluation Patient Details Name: [name] [name] MRN: [id number] DOB: 13-Apr-1927 Today's Date: 03/05/2021   History of Present Illness  Pt is a 87 y.o. female admitted 6/7 to [facility] with N/V abdominal pain and partial gastric outlet obstruction with duodenal mass. NG tube placed and pt underwent EGD with noted deformity of duodenum w/ biopsy though no obvious mass. 6/11 pt transferred to [facility], 6/13 endoscopy. PMhx: HTN, COPD, CKD, SSS s/p PPM, a fib, CAD, CHF, HLD, GERD, PVD, breast cancer.  Clinical Impression  Pt in chair on arrival and asking for drinks but informed still unable to have. Pt normally lives alone with support of family but is alone for several hours a day. Pt currently very limited by fatigue and weakness struggling to walk 10' with RW and assist. Pt with decreased strength, balance, transfers and gait who will benefit from acute therapy to maximize mobility, safety and function to decrease burden of care. Pt encouraged to be OOB for meals and up to [facility] instead of constant purewick use.   HR 110-126 SpO2 98% on RA    Follow Up Recommendations SNF;Supervision for mobility/OOB    Equipment Recommendations  None recommended by PT    Recommendations for Other Services       Precautions / Restrictions Precautions Precautions: Fall;Other (comment) Precaution Comments: NG tube Restrictions Weight Bearing Restrictions: No      Mobility  Bed Mobility Overal bed mobility: Needs Assistance Bed Mobility: Supine to Sit     Supine to sit: Supervision;HOB elevated     General bed mobility comments: in chair on arrival    Transfers Overall transfer level: Needs assistance Equipment used: 1 person hand held assist Transfers: Sit to/from [employer] Transfers Sit to Stand: Min assist Stand pivot transfers: Min assist       General transfer comment: cues for hand placement with assist to rise from recliner and BSC, hand  over hand assist to direct hand placement for stand pivot from recliner to [facility] with min assist to control balance and complete transfer. Pt able to stand with single UE support for pericare in standing with guarding  Ambulation/Gait Ambulation/Gait assistance: Min assist Gait Distance (Feet): 10 Feet Assistive device: Rolling walker (2 wheeled) Gait Pattern/deviations: Step-through pattern;Decreased stride length;Trunk flexed   Gait velocity interpretation: <1.8 ft/sec, indicate of risk for recurrent falls General Gait Details: cues for posture, safety and proximity to RW with min assist to direct and manage lines. pt fatigues quickly with gait  Stairs            Wheelchair Mobility    Modified Rankin (Stroke Patients Only)       Balance Overall balance assessment: Needs assistance Sitting-balance support: No upper extremity supported;Feet supported Sitting balance-Leahy Scale: Fair     Standing balance support: Bilateral upper extremity supported;During functional activity;Single extremity supported Standing balance-Leahy Scale: Poor Standing balance comment: reliant on external support                             Pertinent Vitals/Pain Pain Assessment: No/denies pain    Home Living Family/patient expects to be discharged to:: Private residence Living Arrangements: Alone Available Help at Discharge: Family;Available PRN/intermittently Type of Home: House Home Access: Stairs to enter Entrance Stairs-Rails: Right;Left Entrance Stairs-Number of Steps: 2 at back, 4 through garage Home Layout: One level Home Equipment: [place] - 2 wheels;Shower seat;Hand held shower head;Grab bars - tub/shower;Cane - single point  Additional Comments: Per daughter/pt, someone stays at night with pt and daughters alternate 10 day shifts of checking on her vs being present all day. Pt may be alone approx 5 hours during the day    Prior Function Level of Independence: Needs  assistance   Gait / Transfers Assistance Needed: typically ambulatory without AD vs cane in the home, used RW at MD office recently and became fatigued/SOB with long distance mobility  ADL's / Homemaking Assistance Needed: Typically modified Independent with ADLs, assist with IADLs - daughter/family assist with cooking, laundry, transportation and grocery shopping        Hand Dominance   Dominant Hand: Right    Extremity/Trunk Assessment   Upper Extremity Assessment Upper Extremity Assessment: Generalized weakness    Lower Extremity Assessment Lower Extremity Assessment: Generalized weakness    Cervical / Trunk Assessment Cervical / Trunk Assessment: Kyphotic  Communication   Communication: No difficulties  Cognition Arousal/Alertness: Awake/alert Behavior During Therapy: WFL for tasks assessed/performed Overall Cognitive Status: Impaired/Different from baseline Area of Impairment: Safety/judgement                         Safety/Judgement: Decreased awareness of deficits;Decreased awareness of safety     General Comments: pt with decreased awareness of assist at present and risk for being home alone      General Comments General comments (skin integrity, edema, etc.): SpO2 WFL on RA. HR up to 140 sitting EOB, 150 during transfer. Daughter at bedside and supportive. BP 105/56 after transfer    Exercises General Exercises - Lower Extremity Long Arc Quad: AROM;Both;Seated;10 reps Hip Flexion/Marching: AROM;Both;Seated;10 reps   Assessment/Plan    PT Assessment Patient needs continued PT services  PT Problem List Decreased strength;Decreased mobility;Decreased safety awareness;Decreased activity tolerance;Decreased balance;Decreased knowledge of use of DME;Decreased cognition       PT Treatment Interventions Gait training;Balance training;Functional mobility training;Therapeutic activities;Patient/family education;Stair training;DME instruction;Therapeutic  exercise    PT Goals (Current goals can be found in the Care Plan section)  Acute Rehab PT Goals Patient Stated Goal: drink something, go home PT Goal Formulation: With patient Time For Goal Achievement: 03/19/21 Potential to Achieve Goals: Fair    Frequency Min 3X/week   Barriers to discharge Decreased caregiver support      Co-evaluation               AM-PAC PT "6 Clicks" Mobility  Outcome Measure Help needed turning from your back to your side while in a flat bed without using bedrails?: A Little Help needed moving from lying on your back to sitting on the side of a flat bed without using bedrails?: A Little Help needed moving to and from a bed to a chair (including a wheelchair)?: A Little Help needed standing up from a chair using your arms (e.g., wheelchair or bedside chair)?: A Little Help needed to walk in hospital room?: A Little Help needed climbing 3-5 steps with a railing? : A Lot 6 Click Score: 17    End of Session   Activity Tolerance: Patient tolerated treatment well Patient left: in chair;with call bell/phone within reach;with family/visitor present;with chair alarm set Nurse Communication: Mobility status PT Visit Diagnosis: Other abnormalities of gait and mobility (R26.89);Difficulty in walking, not elsewhere classified (R26.2);Muscle weakness (generalized) (M62.81)    Time: 6712-4580 PT Time Calculation (min) (ACUTE ONLY): 26 min   Charges:   PT Evaluation $PT Eval Moderate Complexity: 1 Mod PT Treatments $Therapeutic Activity: 8-22 mins  [place] Pager: [phone] Office: [place] 03/05/2021, 1:41 PM

## 2021-03-05 NOTE — Progress Notes (Signed)
PROGRESS NOTE    [name] [name]  IEP:[id number] DOB: 09-Jul-1927 DOA: 03/02/2021 PCP: Tracie Harrier, MD    Brief Narrative:  [name] [name] is a 87 y.o. female with medical history significant of essential hypertension, COPD, CKD stage IIIa, SSS s/p PPM, paroxysmal atrial fibrillation on Coumadin, CAD, chronic diastolic congestive heart failure, hyperlipidemia, GERD, peripheral vascular disease, distant history of breast cancer who presents as a transfer from [facility] for partial gastric outlet obstruction and duodenal mass.   HPI obtained from patient and patient's daughter who is present at bedside.  Apparently, patient was experiencing nausea/vomiting and abdominal pain with associated distention.  Symptoms apparently progressed over 2-week, and subsequently sought care at [facility] ED on 02/26/2021.  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  NG tube was placed to low wall intermittent suction.  Gastroenterology and general surgery were consulted and patient underwent EGD with findings of thickened folds mucosa in the antrum, extrinsic deformity second portion of duodenum s/p biopsy, no obvious mass.  General surgery recommended potential surgical options with biliary stent versus gastrojejunostomy. GI recommending further evaluation with MRI/MRCP, given patient with pacemaker present, GI recommended transfer to [facility] where MRI can be performed safely with pacemaker in place.  At baseline, patient is independent of ADLs, uses a cane occasionally for ambulation and lives by herself with a family member staying with her at night.   Assessment & Plan:   Principal Problem:   Partial gastric outlet obstruction Active Problems:   Essential (primary) hypertension   Pacemaker   Atrial fibrillation with RVR (HCC)    COPD (chronic obstructive pulmonary disease) (HCC)   Abdominal pain   CKD (chronic kidney disease), stage IIIa   Duodenal mass   Partial gastric outlet obstruction Biliary obstruction Ampullary vs duodenal mass Small bowel diverticulitis Patient presenting as a transfer from [facility] with 2-week history of progressive nausea/vomiting and abdominal pain with associated distention.  Total bilirubin elevated 2.1.  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 EGD with findings of thickened folds mucosa in the antrum, extrinsic deformity second portion of duodenum s/p biopsy, no obvious mass.  GI and general surgery recommending MRI/MRCP, which cannot be formed at [facility] apparently. [facility] MRI tech states confirmed with [employer] her pacemaker leads placed in 2009 are not compatible with MRI; so unable to obtain MRI/MRCP. CEA elevated 5.8, CA 19-9 within normal limits.  Underwent repeat EGD by Dr. Rush Landmark on 03/04/2021, with no discrete mass/lesion appreciated, suspicion for small bowel diverticulitis. --[facility]/General surgery following, appreciate assistance --Palliative care consultation for assistance with goals of care discussions --N.p.o., okay for ice chips --TPN started 6/12 --NG tube to low wall intermittent suction, monitor output --Ciprofloxacin 400 mg IV every 24 hours --Metronidazole 500 mg IV every 12 hours --CMP daily   Atrial fibrillation with RVR Prior to EGD at [facility], patient's heart rate became elevated.  Given her n.p.o. status, she was placed on a Cardizem drip.  Home medication includes warfarin and metoprolol succinate 50 mg p.o. daily. --Cardizem drip, now off --Metoprolol 5 mg IV every 6 hours; hold for HR <60 or SBP <100 --Holding warfarin for possible need of surgical/endoscopic intervention --Monitor on telemetry   COPD  On  Symbicort, Singulair, Spiriva and albuterol MDI as needed at home. --Dulera 2 puffs twice daily as hospital substitution --Incruse Ellipta 1 puff daily as hospital substitution --DuoNebs every 6 hours as needed for shortness of breath/wheezing   Chronic diastolic congestive heart failure, compensated On metoprolol succinate 50 mg p.o. daily and furosemide 40 mg p.o. daily at baseline. --Holding oral medications while n.p.o. --Strict I's and O's Daily weights   SSS s/p PPM Patient underwent recent pacemaker generator change on 01/11/2020.  Current generator is listed as aPACE AZURE XT DR MRI, her pacemaker leads were placed in 2009 which were not MRI compatible, per MRI tech. --Continue monitor on telemetry   GERD: Holding home PPI while n.p.o.   CKD stage IIIa: Creatinine baseline 1.3. --Cr 1.46>1.52>1.51>1.35>1.27> labs pending today --Avoid nephrotoxins, renally dose all medications --IV fluid hydration as above, starting TPN --Monitor function closely daily  Weakness/fatigue/deconditioning: --PT/OT evaluation today   DVT prophylaxis: Place and maintain sequential compression device Start: 03/02/21 1804    Code Status: DNR Family Communication: Updated daughter, [name] present at bedside this morning  Disposition Plan:  Level of care: Telemetry Medical Status is: Inpatient  Remains inpatient appropriate because:Ongoing diagnostic testing needed not appropriate for outpatient work up, Unsafe d/c plan, IV treatments appropriate due to intensity of illness or inability to take PO, and Inpatient level of care appropriate due to severity of illness  Dispo: The patient is from: Home              Anticipated d/c is to: Home              Patient currently is not medically stable to d/c.   Difficult to place patient No   Consultants:  [facility], Dr. Fuller Plan, Dr. Rush Landmark General Surgery Palliative care  Procedures:  EGD - performed at [facility] EGD - 6/13, Dr.  Rush Landmark  Antimicrobials:  Ciprofloxacin 6/13>> Metronidazole 6/13>>   Subjective: Patient seen examined at bedside, resting comfortably.  Daughter present.  Continues with fatigue and weakness.  Continues with bilious drainage from NG tube.  Would like something to eat or drink this morning, especially "diet Coke".  No other questions or concerns at this time.  Labs pending for this morning.  Awaiting further recommendations per GI and general surgery.  Palliative care consult also pending.  No other questions or concerns from patient or daughter at bedside. Patient denies headache, no visual changes, no fever/chills/night sweats, no nausea/vomiting/diarrhea, no chest pain, no palpitations, no shortness of breath, no current abdominal distention, no weakness, no fatigue, no paresthesias.  No acute events overnight per nursing staff.  Objective: Vitals:   03/05/21 0600 03/05/21 0747 03/05/21 0837 03/05/21 0838  BP: 90/76 (!) 96/37    Pulse: 80 (!) 112    Resp: 20 17    Temp: 98.4 F (36.9 C) 98 F (36.7 C)    TempSrc: Oral Oral    SpO2: 92% 97% 96% 100%  Weight:        Intake/Output Summary (Last 24 hours) at 03/05/2021 0938 Last data filed at 03/05/2021 0906 Gross per 24 hour  Intake 1146.37 ml  Output 150 ml  Net 996.37 ml   Filed Weights   03/03/21 0345 03/04/21 0343 03/05/21 0347  Weight: 77.9 kg 70.5 kg 72.1 kg    Examination:  General exam: Appears calm and comfortable  Respiratory system: Clear to auscultation. Respiratory effort normal.  On room air Cardiovascular system: S1 & S2 heard, tachycardic, irregularly irregular rhythm no JVD,  murmurs, rubs, gallops or clicks. No pedal edema. Gastrointestinal system: Abdomen is nondistended, soft, and nontender. No organomegaly or masses felt. Normal bowel sounds heard.  NG tube noted with bilious drainage Central nervous system: Alert and oriented. No focal neurological deficits. Extremities: Symmetric 5 x 5 power. Skin:  No rashes, lesions or ulcers Psychiatry: Judgement and insight appear normal. Mood & affect appropriate.     Data Reviewed: I have personally reviewed following labs and imaging studies  CBC: Recent Labs  Lab 02/27/21 0429 02/28/21 0404 03/01/21 0500 03/02/21 1748 03/04/21 0500  WBC 7.7 9.0 8.7 9.9 10.6*  NEUTROABS  --   --   --   --  8.0*  HGB 13.6 14.2 14.4 13.8 12.9  HCT 40.4 43.0 43.8 41.8 40.0  MCV 93.1 95.8 96.5 95.4 97.8  PLT 178 169 162 161 628*   Basic Metabolic Panel: Recent Labs  Lab 02/27/21 0429 02/28/21 0404 03/01/21 0500 03/02/21 1748 03/03/21 0057 03/03/21 1004 03/04/21 0500  NA 140 143 146* 143 144  --  143  K 4.5 4.3 3.9 3.5 3.6  --  3.5  CL 102 103 103 103 105  --  103  CO2 26 29 29 29 25   --  32  GLUCOSE 119* 95 79 130* 121*  --  144*  BUN 35* 38* 43* 35* 34*  --  29*  CREATININE 1.46* 1.52* 1.51* 1.42* 1.35*  --  1.27*  CALCIUM 9.8 9.9 9.7 9.0 9.1  --  9.2  MG 2.2 2.2  --   --   --  2.0 2.0  PHOS  --  4.9*  --   --   --  3.1 2.2*   GFR: Estimated Creatinine Clearance: 27.5 mL/min (A) (by C-G formula based on SCr of 1.27 mg/dL (H)). Liver Function Tests: Recent Labs  Lab 02/28/21 0404 03/01/21 0500 03/02/21 1748 03/03/21 0057 03/04/21 0500  AST 27 28 28 26 20   ALT 15 16 13 10 10   ALKPHOS 75 59 61 58 58  BILITOT 1.8*  1.8* 2.1* 2.2* 1.8* 1.8*  PROT 7.4 6.9 5.9* 5.4* 5.7*  ALBUMIN 4.2 3.7 3.0* 2.8* 2.6*   Recent Labs  Lab 02/26/21 1305  LIPASE 44   No results for input(s): AMMONIA in the last 168 hours. Coagulation Profile: Recent Labs  Lab 02/27/21 0429 02/28/21 0404 03/01/21 0500 03/02/21 0514 03/02/21 1748  INR 2.4* 2.3* 1.5* 1.4* 1.6*   Cardiac Enzymes: No results for input(s): CKTOTAL, CKMB, CKMBINDEX, TROPONINI in the last 168 hours. BNP (last 3 results) No results for input(s): PROBNP in the last 8760 hours. HbA1C: No results for input(s): HGBA1C in the last 72 hours. CBG: Recent Labs  Lab 03/04/21 0015  03/04/21 0611 03/04/21 1653 03/04/21 2328 03/05/21 0627  GLUCAP 132* 153* 152* 121* 123*   Lipid Profile: Recent Labs    03/03/21 1004 03/04/21 0500  TRIG 100 107   Thyroid Function Tests: No results for input(s): TSH, T4TOTAL, FREET4, T3FREE, THYROIDAB in the last 72 hours. Anemia Panel: No results for input(s): VITAMINB12, FOLATE, FERRITIN, TIBC, IRON, RETICCTPCT in the last 72 hours. Sepsis Labs: No results for input(s): PROCALCITON, LATICACIDVEN in the last 168 hours.  Recent Results (from the past 240 hour(s))  SARS CORONAVIRUS 2 (TAT 6-24 HRS) Nasopharyngeal Nasopharyngeal Swab     Status: None   Collection Time: 02/26/21  2:32 PM   Specimen: Nasopharyngeal Swab  Result Value Ref Range Status   SARS Coronavirus 2 NEGATIVE NEGATIVE Final  Comment: (NOTE) SARS-CoV-2 target nucleic acids are NOT DETECTED.  The SARS-CoV-2 RNA is generally detectable in upper and lower respiratory specimens during the acute phase of infection. Negative results do not preclude SARS-CoV-2 infection, do not rule out co-infections with other pathogens, and should not be used as the sole basis for treatment or other patient management decisions. Negative results must be combined with clinical observations, patient history, and epidemiological information. The expected result is Negative.  Fact Sheet for Patients: SugarRoll.be  Fact Sheet for Healthcare Providers: https://www.woods-mathews.com/  This test is not yet approved or cleared by the [place] FDA and  has been authorized for detection and/or diagnosis of SARS-CoV-2 by FDA under an Emergency Use Authorization (EUA). This EUA will remain  in effect (meaning this test can be used) for the duration of the COVID-19 declaration under Se ction 564(b)(1) of the Act, 21 U.S.C. section 360bbb-3(b)(1), unless the authorization is terminated or revoked sooner.  Performed at [facility], [place] [address], [place], [place] [address]          Radiology Studies: DG Chest Port 1 View  Result Date: 03/03/2021 CLINICAL DATA:  Peripherally inserted central catheter (PICC) central line placement EXAM: PORTABLE CHEST 1 VIEW COMPARISON:  None. FINDINGS: There is a right upper extremity approach PICC with tip overlying the superior cavoatrial junction. Unchanged dual chamber pacemaker leads and enlarged cardiomediastinal silhouette with tortuous thoracic aorta. Aortic calcifications. Right basilar subsegmental atelectasis. No other new focal airspace disease. There is no large pleural effusion or visible pneumothorax. There is an anatomic right shoulder arthroplasty. No acute osseous abnormality. IMPRESSION: Right upper extremity PICC tip overlies the superior cavoatrial junction. Right basilar subsegmental atelectasis. Electronically Signed   By: Maurine Simmering   On: 03/03/2021 12:32        Scheduled Meds:  Chlorhexidine Gluconate Cloth  6 each Topical Daily   insulin aspart  0-9 Units Subcutaneous Q6H   metoprolol tartrate  5 mg Intravenous Q6H   mometasone-formoterol  2 puff Inhalation BID   pantoprazole (PROTONIX) IV  40 mg Intravenous Q12H   sodium chloride flush  10-40 mL Intracatheter Q12H   umeclidinium bromide  1 puff Inhalation Daily   Continuous Infusions:  ciprofloxacin 400 mg (03/04/21 1847)   diltiazem (CARDIZEM) infusion Stopped (03/03/21 0554)   metronidazole 500 mg (03/05/21 0452)   TPN ADULT (ION) 60 mL/hr at 03/04/21 1847     LOS: 3 days    Time spent: 38 minutes spent on chart review, discussion with nursing staff, consultants, updating family and interview/physical exam; more than 50% of that time was spent in counseling and/or coordination of care.    Erikka Follmer J [place] DO [facility] Available via Epic secure chat 7am-7pm After these hours, please refer to coverage provider listed on [email or web address] 03/05/2021, 9:38 AM

## 2021-03-06 ENCOUNTER — Inpatient Hospital Stay (HOSPITAL_COMMUNITY): Payer: PPO

## 2021-03-06 ENCOUNTER — Other Ambulatory Visit: Payer: Self-pay

## 2021-03-06 ENCOUNTER — Ambulatory Visit: Payer: PPO | Admitting: Internal Medicine

## 2021-03-06 ENCOUNTER — Encounter (HOSPITAL_COMMUNITY): Payer: Self-pay | Admitting: Gastroenterology

## 2021-03-06 DIAGNOSIS — I4891 Unspecified atrial fibrillation: Secondary | ICD-10-CM

## 2021-03-06 DIAGNOSIS — K3189 Other diseases of stomach and duodenum: Secondary | ICD-10-CM

## 2021-03-06 DIAGNOSIS — Z7189 Other specified counseling: Secondary | ICD-10-CM

## 2021-03-06 LAB — COMPREHENSIVE METABOLIC PANEL
ALT: 10 U/L (ref 0–44)
AST: 16 U/L (ref 15–41)
Albumin: 2.1 g/dL — ABNORMAL LOW (ref 3.5–5.0)
Alkaline Phosphatase: 38 U/L (ref 38–126)
Anion gap: 7 (ref 5–15)
BUN: 40 mg/dL — ABNORMAL HIGH (ref 8–23)
CO2: 26 mmol/L (ref 22–32)
Calcium: 8.6 mg/dL — ABNORMAL LOW (ref 8.9–10.3)
Chloride: 109 mmol/L (ref 98–111)
Creatinine, Ser: 1.25 mg/dL — ABNORMAL HIGH (ref 0.44–1.00)
GFR, Estimated: 41 mL/min — ABNORMAL LOW (ref >60)
Glucose, Bld: 115 mg/dL — ABNORMAL HIGH (ref 70–99)
Potassium: 4.1 mmol/L (ref 3.5–5.1)
Sodium: 142 mmol/L (ref 135–145)
Total Bilirubin: 0.9 mg/dL (ref 0.3–1.2)
Total Protein: 4.7 g/dL — ABNORMAL LOW (ref 6.5–8.1)

## 2021-03-06 LAB — MAGNESIUM: Magnesium: 1.8 mg/dL (ref 1.7–2.4)

## 2021-03-06 LAB — PHOSPHORUS: Phosphorus: 3.8 mg/dL (ref 2.5–4.6)

## 2021-03-06 LAB — SURGICAL PATHOLOGY

## 2021-03-06 LAB — GLUCOSE, CAPILLARY: Glucose-Capillary: 120 mg/dL — ABNORMAL HIGH (ref 70–99)

## 2021-03-06 MED ORDER — METOPROLOL TARTRATE 5 MG/5ML IV SOLN
2.5000 mg | Freq: Once | INTRAVENOUS | Status: AC
  Administered 2021-03-06: 2.5 mg via INTRAVENOUS
  Filled 2021-03-06: qty 5

## 2021-03-06 MED ORDER — TRAVASOL 10 % IV SOLN
INTRAVENOUS | Status: AC
  Filled 2021-03-06: qty 960

## 2021-03-06 MED ORDER — BOOST / RESOURCE BREEZE PO LIQD CUSTOM: 1.0000 | Freq: Three times a day (TID) | ORAL | Status: DC

## 2021-03-06 MED ORDER — SODIUM CHLORIDE 0.9 % IV BOLUS
500.0000 mL | Freq: Once | INTRAVENOUS | Status: AC
  Administered 2021-03-06: 500 mL via INTRAVENOUS

## 2021-03-06 MED ORDER — METOPROLOL TARTRATE 5 MG/5ML IV SOLN
2.5000 mg | Freq: Four times a day (QID) | INTRAVENOUS | Status: DC
  Administered 2021-03-06 – 2021-03-07 (×3): 2.5 mg via INTRAVENOUS
  Filled 2021-03-06 (×2): qty 5

## 2021-03-06 NOTE — Progress Notes (Signed)
Cross-coverage note:   Notified of recurrent N/V and a fib RVR with rate 130.   Patient with gastric outlet obstruction had NGT removed this evening and is now having recurrent episodes of N/V. Plan to replace NG tube for now and give IV Lopressor.

## 2021-03-06 NOTE — Progress Notes (Signed)
[facility] Progress Note  2 Days Post-Op  Subjective: CC:   NAEO. NG in place and has been clamped for 24 hours. Paitent denies nausea or vomiting but does endorse abdominal fullness and does not feel hungry. Feels her abdomen may be slightly more full/distended in the last 24 hours. She is having flatus but denies known BM this admission.  Objective: Vital signs in last 24 hours: Temp:  [97.7 F (36.5 C)-98.8 F (37.1 C)] 97.8 F (36.6 C) (06/15 0734) Pulse Rate:  [81-127] 112 (06/15 1208) Resp:  [17-24] 17 (06/15 0734) BP: (90-184)/(41-171) 112/61 (06/15 1208) SpO2:  [100 %] 100 % (06/15 0855) Weight:  [69.8 kg] 69.8 kg (06/15 0610) Last BM Date: 02/26/21  Intake/Output from previous day: 06/14 0701 - 06/15 0700 In: 3535.2 [I.V.:2364.2; IV Piggyback:1171] Out: 1000 [Urine:700; Emesis/NG output:300] Intake/Output this shift: Total I/O In: 10 [I.V.:10] Out: -   PE: Gen:  Alert, NAD, pleasant Card:  Regular rate and rhythm, pedal pulses 2+ BL Pulm:  Normal effort, clear to auscultation bilaterally Abd: Soft, non-tender, mild distention, NG in place, clamped. Skin: warm and dry, no rashes  Psych: A&Ox3   Lab Results:  Recent Labs    03/04/21 0500  WBC 10.6*  HGB 12.9  HCT 40.0  PLT 142*   BMET Recent Labs    03/05/21 0910 03/06/21 0321  NA 141 142  K 3.9 4.1  CL 108 109  CO2 26 26  GLUCOSE 115* 115*  BUN 30* 40*  CREATININE 1.14* 1.25*  CALCIUM 8.6* 8.6*   PT/INR No results for input(s): LABPROT, INR in the last 72 hours.  CMP     Component Value Date/Time   NA 142 03/06/2021 0321   K 4.1 03/06/2021 0321   CL 109 03/06/2021 0321   CO2 26 03/06/2021 0321   GLUCOSE 115 (H) 03/06/2021 0321   BUN 40 (H) 03/06/2021 0321   CREATININE 1.25 (H) 03/06/2021 0321   CALCIUM 8.6 (L) 03/06/2021 0321   PROT 4.7 (L) 03/06/2021 0321   ALBUMIN 2.1 (L) 03/06/2021 0321   AST 16 03/06/2021 0321   ALT 10 03/06/2021 0321   ALKPHOS 38 03/06/2021  0321   BILITOT 0.9 03/06/2021 0321   GFRNONAA 41 (L) 03/06/2021 0321   GFRAA 60 (L) 08/10/2018 1049   Lipase     Component Value Date/Time   LIPASE 44 02/26/2021 1305       Studies/Results: No results found.  Anti-infectives: Anti-infectives (From admission, onward)    Start     Dose/Rate Route Frequency Ordered Stop   03/04/21 1700  ciprofloxacin (CIPRO) IVPB 400 mg        400 mg 200 mL/hr over 60 Minutes Intravenous Every 24 hours 03/04/21 1600     03/04/21 1600  metroNIDAZOLE (FLAGYL) IVPB 500 mg        500 mg 100 mL/hr over 60 Minutes Intravenous Every 12 hours 03/04/21 1556     03/04/21 1600  ciprofloxacin (CIPRO) IVPB 400 mg  Status:  Discontinued        400 mg 200 mL/hr over 60 Minutes Intravenous Every 12 hours 03/04/21 1556 03/04/21 1600       Assessment/Plan  Partial gastric outlet obstruction Biliary obstruction Hyperbilirubinemia - TB 1.8 - CT abdomen/pelvis was notable for a duodenal mass at the junction of the second and third part of the duodenum causing dilation proximally and possibly obstructing the distal common bile duct - unable to obtain MRCP due to pacemaker. GI consulted 6/12.  Repeat EGD and EUS done 6/13, Bx taken from D2/D3 and path pending, follow. GI started abx for possible duodenal diverticulitis.  - palliative care consulted to initiate goals of care discussions - will follow their discussions.  - Pre-albumin is 9.1. continue PICC/TPN.  - We will follow, surgical intervention would likely be an open palliative gastrojejunostomy.   ID - none VTE - hold coumadin, ok for IV heparin or chemical dvt prophylaxis per primary FEN - IVF, NPO/NGT clamped. Trial clear liquids - encouraged patient to go VERY slow and only take in small amounts at a time. Foley - none   Atrial fibrillation with RVR COPD Chronic diastolic CHF CAD HTN HLD SSS s/p PPM GERD CKD stage IIIa Code status DNR   LOS: 4 days    Obie Dredge, [facility] Please see Amion for pager number during day hours 7:00am-4:30pm

## 2021-03-06 NOTE — Progress Notes (Addendum)
Nutrition Follow-up  DOCUMENTATION CODES:   Not applicable  INTERVENTION:   -TPN management per pharmacy -Boost Breeze po TID, each supplement provides 250 kcal and 9 grams of protein  -RD will follow for diet advancement and adjust supplement regimen as appropriate  NUTRITION DIAGNOSIS:   Inadequate oral intake related to altered GI function as evidenced by NPO status.  Ongoing  GOAL:   Patient will meet greater than or equal to 90% of their needs  Met with TPN  MONITOR:   Diet advancement, Labs, Weight trends, Skin, I & O's  REASON FOR ASSESSMENT:   Consult New TPN/TNA  ASSESSMENT:   [name] [name] is a 87 y.o. female with medical history significant of essential hypertension, COPD, CKD stage IIIa, SSS s/p PPM, paroxysmal atrial fibrillation on Coumadin, CAD, chronic diastolic congestive heart failure, hyperlipidemia, GERD, peripheral vascular disease, distant history of breast cancer who presents as a transfer from [facility] for partial gastric outlet obstruction and duodenal mass.  6/12- TPN initiated, PICC placed 6/13- s/p EGD/ EUS- revealed gastric ulcers from NGT trauma, duodenal stenosis with associated inflammation/ erythema (biopsied)  6/15- NGT removed, advanced to clear liquid diet  Reviewed I/O's: +2.5 L x 24 hours and +2.2 L since admission  UOP: 700 ml x 24 hours  NGT output: 300 ml x 24 hours   Per GI notes, awaiting results of duodenal biopsies. Pt has been passing flatus. NGT has been removed and pt has been advanced top a clear liquid diet.   Per general surgery notes, plan for potential gastrojejunostomy placement pending biopsy results and goals of care. Palliative continues to follow.   Pt receiving TPN at 80 ml/hr, which provides 1906 kcals and 96 grams protein, meeting 100% of estimated kcal needs.   Labs reviewed: K, Mg, and Phos WDL. CBGS: 101-120 (inpatient orders for glycemic control are 0-9 units insulin aspart  every 6 hours).    Diet Order:   Diet Order             Diet NPO time specified Except for: Ice Chips  Diet effective now                   EDUCATION NEEDS:   Education needs have been addressed  Skin:  Skin Assessment: Reviewed RN Assessment  Last BM:  02/26/21  Height:   Ht Readings from Last 1 Encounters:  03/05/21 '5\' 3"'  (1.6 m)    Weight:   Wt Readings from Last 1 Encounters:  03/06/21 69.8 kg    Ideal Body Weight:  52.3 kg  BMI:  Body mass index is 27.26 kg/m.  Estimated Nutritional Needs:   Kcal:  1658-0063  Protein:  90-105 grams  Fluid:  > 1.7 L    Loistine Chance, RD, LDN, [place] Registered Dietitian II Certified Diabetes Care and Education Specialist Please refer to [facility] for RD and/or RD on-call/weekend/after hours pager

## 2021-03-06 NOTE — Progress Notes (Signed)
PHARMACY - TOTAL PARENTERAL NUTRITION CONSULT NOTE   Indication:  partial gastric outlet obstruction  and duodenal mass  Patient Measurements: Height: 5\' 3"  (160 cm) Weight: 69.8 kg (153 lb 14.1 oz) IBW/kg (Calculated) : 52.4   Body mass index is 27.26 kg/m. Usual Weight: unknown  Assessment:  35 yof experiencing N/V and abdominal pain with distention progressing over two week period who presented to [facility] ED on 6/7. 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  NGT placed on LIWS. Surgery and GI consulted. EGD with findings of thickened folds mucosa in the antrum, extrinsic deformity second portion of duodenum s/p biopsy, no obvious mass. MRCP recommended but unable to be done due to PPM. Transferred to [facility] for continued work-up.  Glucose / Insulin: no hx DM, CBGs controlled, used 1 unit sSSI Electrolytes: all wnl Renal: CKD, SCr 1.25 (Baseline 1.3), BUN 40 (trending up) Hepatic: LFTs WNL, TG 100, Tbili normalized, albumin 2.1, prealbumin 9.1 Intake / Output; MIVF: UOP 700 ml, NG output 300 ml, MIVF d/c'd GI Imaging: 6/7 CT A/P: dilation of the proximal duodenum which may represent duodenal mass  6/10 Abd XR: no bowel obstruction or free air, mod air in stomach GI Surgeries / Procedures:  6/10 EGD: biopsied thickened folds mucosa and duodenal deformity however no obvious mass  6/13 EGD: no lesions in esophagus; found non-bleeding gastric ulcers, duodenal stenosis w/ inflamed mucosa, and likely small bowel diverticulitis  Central access: triple lumen PICC placed 6/12 TPN start date: 03/03/21  Nutritional Goals (per RD recommendation 6/13): kCal: 1750-1950, Protein: 90-105 g, Fluid: >1.7L Goal TPN rate is 80 mL/hr (provides 95 g of protein and ~1900 kcals per day)  Current Nutrition:  NPO and TPN  Plan:  Continue TPN at goal of 76mL/hr  at 1800  Electrolytes in TPN: Na 30mEq/L, K 29mEq/L, Ca 71mEq/L, Mg 27mEq/L, and Phos 31mmol/L. Cl:Ac 1:1 Add standard MVI and trace elements to TPN D/c SSI and CBGs checks as CBGs are well controlled  Monitor TPN labs on Mon/Thurs  Thank you for involving pharmacy in this patient's care.  Dimple Nanas, PharmD 03/06/2021 7:29 AM  **Pharmacist phone directory can be found on [email or web address] listed under [place]**

## 2021-03-06 NOTE — Progress Notes (Signed)
NG tube removed by PA about 1 hour ago. Pt had an icee around 3pm, per family. Pt reported nausea and PRN zofran given. Pt then vomited 50cc of bile/greenish colored emesis. MD notified.

## 2021-03-06 NOTE — Care Management Important Message (Signed)
Important Message  Patient Details  Name: [name] [name] MRN: [id number] Date of Birth: Mar 15, 1927   Medicare Important Message Given:  Yes     Tesla Bochicchio 03/06/2021, 11:59 AM

## 2021-03-06 NOTE — Progress Notes (Signed)
Daily Progress Note   Patient Name: [name] [name]       Date: 03/06/2021 DOB: 07/11/1927  Age: 87 y.o. MRN#: [id number] Attending Physician: Edwin Dada, * Primary Care Physician: Tracie Harrier, MD Admit Date: 03/02/2021  Reason for Consultation/Follow-up: Establishing goals of care and Psychosocial/spiritual support  Subjective: Medical records reviewed. Assessed patient at the bedside and met with her daughter [name] [name]. [name] [name] is alert but fatigued. Reports feeling worse today due to weakness and inability to have food or water. She dislikes being mostly in bed and feels concerned that her nausea has returned, although not nearly as bad as before. [name] [name] is also worried that she may not tolerate the trial of fluids or regain her energy. She continues to express that she would prefer to avoid surgery.  [name] [name] shares that the family is still awaiting biopsy results from EGD before determining next steps and goals. She states "mom wants to go home but she can't do that right now." Provided clarification and education that [name] [name]'s desire to go home can be prioritized if she desires a comfort-focused path. This would include management of any symptoms that may cause discomfort, pain, shortness of breath, cough, nausea, agitation, anxiety, and/or secretions etc. She may also attempt comfort feeds if the goal is comfort and the risks are acceptable.    Created space and opportunity for [name] [name]'s thoughts and feelings on continuing cautious medical management or alternatively transitioning to a comfort path. She is willing to continue with current interventions and the slow introduction of oral intake, understanding the importance of ongoing discussions.   Questions and concerns addressed. PMT will  continue to support holistically.  Length of Stay: 4  Current Medications: Scheduled Meds:   Chlorhexidine Gluconate Cloth  6 each Topical Daily   metoprolol tartrate  2.5 mg Intravenous Once   mometasone-formoterol  2 puff Inhalation BID   pantoprazole (PROTONIX) IV  40 mg Intravenous Q12H   sodium chloride flush  10-40 mL Intracatheter Q12H   umeclidinium bromide  1 puff Inhalation Daily    Continuous Infusions:  ciprofloxacin 400 mg (03/05/21 1702)   metronidazole 500 mg (03/06/21 0459)   TPN ADULT (ION) 80 mL/hr at 03/05/21 1710   TPN ADULT (ION)      PRN Meds: acetaminophen, ALPRAZolam, ipratropium-albuterol, ketorolac, morphine injection, ondansetron **OR** ondansetron (  ZOFRAN) IV, sodium chloride flush  Physical Exam Vitals and nursing note reviewed.  Constitutional:      Appearance: She is ill-appearing.     Comments: NGT secured in place.  Cardiovascular:     Rate and Rhythm: Tachycardia present.  Pulmonary:     Effort: Pulmonary effort is normal.  Skin:    General: Skin is warm and dry.  Neurological:     Mental Status: She is alert and oriented to person, place, and time.             Vital Signs: BP (!) 128/41 (BP Location: Right Leg)   Pulse (!) 127   Temp 97.8 F (36.6 C) (Oral)   Resp 17   Ht '5\' 3"'  (1.6 m)   Wt 69.8 kg   SpO2 100%   BMI 27.26 kg/m  SpO2: SpO2: 100 % O2 Device: O2 Device: Room Air O2 Flow Rate: O2 Flow Rate (L/min): 0 L/min  Intake/output summary:  Intake/Output Summary (Last 24 hours) at 03/06/2021 1105 Last data filed at 03/06/2021 0820 Gross per 24 hour  Intake 3535.19 ml  Output 1000 ml  Net 2535.19 ml   LBM: Last BM Date: 02/26/21 Baseline Weight: Weight: 77.9 kg Most recent weight: Weight: 69.8 kg       Palliative Assessment/Data: 40%     Patient Active Problem List   Diagnosis Date Noted   Goals of care, counseling/discussion    Partial gastric outlet obstruction 03/02/2021   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    Abdominal pain 02/26/2021   Nausea vomiting and diarrhea 02/26/2021   COPD (chronic obstructive pulmonary disease) (HCC)    DVT (deep venous thrombosis) (HCC)    CAD (coronary artery disease)    Chronic diastolic CHF (congestive heart failure) (HCC)    Elevated lactic acid level    CKD (chronic kidney disease), stage IIIa    Esophageal stricture 07/12/2018   Osteoporosis 07/12/2018   Urge urinary incontinence 07/12/2018   Hypersensitivity pneumonitis due to unspecified organic dust ([place]) 05/18/2018   VHD (valvular heart disease) 05/18/2018   Chest discomfort 03/16/2017   Atrial fibrillation with RVR ([place]) 03/23/2015   SOB (shortness of breath) 03/06/2014   Essential (primary) hypertension 12/29/2013   Hyperlipidemia, unspecified 12/29/2013   Pacemaker 10/26/2007    Palliative Care Assessment & Plan   Patient Profile: 87 y.o. female  with past medical history of essential hypertension, COPD, CKD stage IIIa, SSS s/p PPM, paroxysmal atrial fibrillation on Coumadin, CAD, chronic diastolic congestive heart failure, hyperlipidemia, GERD, peripheral vascular disease, distant history of breast cancer admitted on 03/02/2021 from [facility] with partial gastric outlet obstruction and duodenal mass.   NG tube was placed to low wall intermittent suction.  Gastroenterology and general surgery were consulted and patient underwent EGD and biopsy of duodenum. Palliative care has been consulted to provide support and assist with goals of care discussions as the patient and family are faced with potential treatment options and difficult decision making.  Assessment: Partial gastric outlet obstruction Duodenal mass Small bowel diverticulitis Afib with RVR Deconditioning Goals of care discussion  Recommendations/Plan: Continue medical management per GI and hospitalist Patient would likely not desire surgical intervention Psychosocial and emotional support provided PMT will continue to support and  facilitate ongoing goals of care discussions pending clinical course  Goals of Care and Additional Recommendations: Limitations on Scope of Treatment: Likely No Surgical Procedures  Prognosis:  Unable to determine  Discharge Planning: To Be Determined  Care plan was discussed with patient, patient's daughter  [name] [name]  Total time: 25 minutes Greater than 50% of this time was spent in counseling and coordinating care related to the above assessment and plan.  Dorthy Cooler, PA-C Palliative Medicine Team Team phone # [phone]  Thank you for allowing the Palliative Medicine Team to assist in the care of this patient. Please utilize secure chat with additional questions, if there is no response within 30 minutes please call the above phone number.  Palliative Medicine Team providers are available by phone from 7am to 7pm daily and can be reached through the team cell phone.  Should this patient require assistance outside of these hours, please call the patient's attending physician.

## 2021-03-06 NOTE — Progress Notes (Signed)
[facility] PROGRESS NOTE    [name] [name]  EXH:[id number] DOB: 1926-10-25 DOA: 03/02/2021 PCP: [name] Harrier, MD      Brief Narrative:  [name] [name] is a 87 y.o. F with HTN, COPD on home O2, SSS s/p PPM, pAF on warfarin, CAD, dCHF, CKD IIIa, PVD and breast cancer remote who presented with partial gastric outlet obstruction and duodenal mass.  Patient first developed about 2 weeks of abdominal distention, pain, and nausea vomiting.  In the ER at [facility], CT showed duodenal mass causing proximal dilation and apparently obstructing the distal common bile duct and gastric outlet.  Gastroenterology and general surgery were consulted, EGD showed thickened folds of mucosa in the antrum, and an extrinsic deformity in the second portion of the duodenum which was biopsied.  MRCP was recommended so the patient was transferred to [facility].            Assessment & Plan:  Partial gastric outlet obstruction and biliary obstruction Due to ampullary versus duodenal mass, possible malignancy versus small bowel diverticulitis  -Trial clears today - Continue tube feeds - Continue Cipro Flagyl - Consult gastroenterology General surgery, IR appreciate expertise - Continue palliative care -Continue PPI   Atrial fibrillation with RVR  At home patient is on metoprolol and diltiazem, held here due to NG tube. -Resume metoprolol q6  COPD  Chronic hypoxic respiratory failure On Home O2. -Continue ICS/LAMA - Continue Incruse - As needed bronchodilators  SSS with pacer  Chronic diastolic CHF Appears euvolemic  CKD stage IIIa AKI ruled out           Disposition: Status is: Inpatient  Remains inpatient appropriate because:IV treatments appropriate due to intensity of illness or inability to take PO  Dispo: The patient is from: Home              Anticipated d/c is to: SNF              Patient currently is not medically stable to d/c.   Difficult to  place patient No       Level of care: Telemetry Medical       MDM: The below labs and imaging reports were reviewed and summarized above.  Medication management as above.     DVT prophylaxis: Place and maintain sequential compression device Start: 03/02/21 1804  Code Status: DNR Family Communication: Daughter at the bedside            Subjective: Nausea.  She has some back and abdominal pain, these are mild to moderate.  No confusion, chest pain, dyspnea.  Objective: Vitals:   03/06/21 0855 03/06/21 1135 03/06/21 1208 03/06/21 1627  BP:  (!) 147/46 112/61 (!) 112/56  Pulse:  (!) 122 (!) 112 (!) 123  Resp:    18  Temp:    99.1 F (37.3 C)  TempSrc:    Oral  SpO2: 100%   100%  Weight:      Height:        Intake/Output Summary (Last 24 hours) at 03/06/2021 1738 Last data filed at 03/06/2021 1705 Gross per 24 hour  Intake 2954.93 ml  Output 50 ml  Net 2904.93 ml   Filed Weights   03/04/21 0343 03/05/21 0347 03/06/21 0610  Weight: 70.5 kg 72.1 kg 69.8 kg    Examination: General appearance: Elderly adult female, alert and in mild distress.  Appears tired and uncomfortable HEENT: Anicteric, conjunctiva pink, lids and lashes normal. No nasal deformity, discharge, epistaxis.  Lips  moist, NG tube in place, oropharynx tacky dry, no oral lesions.   Skin: Warm and dry.  no jaundice.  No suspicious rashes or lesions. Cardiac: Tachycardic, irregular, nl S1-S2, no murmurs appreciated.  Capillary refill is brisk.  JVP normal.  No LE edema.  Radial pulses 2+ and symmetric. Respiratory: Normal respiratory rate and rhythm.  CTAB without rales or wheezes. Abdomen: Abdomen soft.  There is tenderness to palpation, no guarding or rebound or rigidity. No ascites, distension, hepatosplenomegaly.   MSK: No deformities or effusions. Neuro: Awake and alert.  EOMI, moves all extremities with generalized weakness. Speech fluent.    Psych: Sensorium intact and responding to  questions, attention normal. Affect normal.  Judgment and insight appear normal.    Data Reviewed: I have personally reviewed following labs and imaging studies:  CBC: Recent Labs  Lab 02/28/21 0404 03/01/21 0500 03/02/21 1748 03/04/21 0500  WBC 9.0 8.7 9.9 10.6*  NEUTROABS  --   --   --  8.0*  HGB 14.2 14.4 13.8 12.9  HCT 43.0 43.8 41.8 40.0  MCV 95.8 96.5 95.4 97.8  PLT 169 162 161 017*   Basic Metabolic Panel: Recent Labs  Lab 02/28/21 0404 03/01/21 0500 03/02/21 1748 03/03/21 0057 03/03/21 1004 03/04/21 0500 03/05/21 0910 03/06/21 0321  NA 143   < > 143 144  --  143 141 142  K 4.3   < > 3.5 3.6  --  3.5 3.9 4.1  CL 103   < > 103 105  --  103 108 109  CO2 29   < > 29 25  --  32 26 26  GLUCOSE 95   < > 130* 121*  --  144* 115* 115*  BUN 38*   < > 35* 34*  --  29* 30* 40*  CREATININE 1.52*   < > 1.42* 1.35*  --  1.27* 1.14* 1.25*  CALCIUM 9.9   < > 9.0 9.1  --  9.2 8.6* 8.6*  MG 2.2  --   --   --  2.0 2.0 1.9 1.8  PHOS 4.9*  --   --   --  3.1 2.2* 3.0 3.8   < > = values in this interval not displayed.   GFR: Estimated Creatinine Clearance: 27.5 mL/min (A) (by C-G formula based on SCr of 1.25 mg/dL (H)). Liver Function Tests: Recent Labs  Lab 03/02/21 1748 03/03/21 0057 03/04/21 0500 03/05/21 0910 03/06/21 0321  AST 28 26 20 17 16   ALT 13 10 10 11 10   ALKPHOS 61 58 58 47 38  BILITOT 2.2* 1.8* 1.8* 0.8 0.9  PROT 5.9* 5.4* 5.7* 5.1* 4.7*  ALBUMIN 3.0* 2.8* 2.6* 2.3* 2.1*   No results for input(s): LIPASE, AMYLASE in the last 168 hours. No results for input(s): AMMONIA in the last 168 hours. Coagulation Profile: Recent Labs  Lab 02/28/21 0404 03/01/21 0500 03/02/21 0514 03/02/21 1748  INR 2.3* 1.5* 1.4* 1.6*   Cardiac Enzymes: No results for input(s): CKTOTAL, CKMB, CKMBINDEX, TROPONINI in the last 168 hours. BNP (last 3 results) No results for input(s): PROBNP in the last 8760 hours. HbA1C: No results for input(s): HGBA1C in the last 72  hours. CBG: Recent Labs  Lab 03/05/21 0627 03/05/21 1328 03/05/21 1843 03/05/21 2350 03/06/21 0605  GLUCAP 123* 105* 101* 117* 120*   Lipid Profile: Recent Labs    03/04/21 0500  TRIG 107   Thyroid Function Tests: No results for input(s): TSH, T4TOTAL, FREET4, T3FREE, THYROIDAB in the last  72 hours. Anemia Panel: No results for input(s): VITAMINB12, FOLATE, FERRITIN, TIBC, IRON, RETICCTPCT in the last 72 hours. Urine analysis:    Component Value Date/Time   COLORURINE YELLOW (A) 02/26/2021 1651   APPEARANCEUR HAZY (A) 02/26/2021 1651   LABSPEC 1.040 (H) 02/26/2021 1651   PHURINE 5.0 02/26/2021 1651   GLUCOSEU NEGATIVE 02/26/2021 1651   HGBUR NEGATIVE 02/26/2021 1651   BILIRUBINUR NEGATIVE 02/26/2021 1651   KETONESUR NEGATIVE 02/26/2021 1651   PROTEINUR NEGATIVE 02/26/2021 1651   NITRITE NEGATIVE 02/26/2021 1651   LEUKOCYTESUR TRACE (A) 02/26/2021 1651   Sepsis Labs: @LABRCNTIP (procalcitonin:4,lacticacidven:4)  ) Recent Results (from the past 240 hour(s))  SARS CORONAVIRUS 2 (TAT 6-24 HRS) Nasopharyngeal Nasopharyngeal Swab     Status: None   Collection Time: 02/26/21  2:32 PM   Specimen: Nasopharyngeal Swab  Result Value Ref Range Status   SARS Coronavirus 2 NEGATIVE NEGATIVE Final    Comment: (NOTE) SARS-CoV-2 target nucleic acids are NOT DETECTED.  The SARS-CoV-2 RNA is generally detectable in upper and lower respiratory specimens during the acute phase of infection. Negative results do not preclude SARS-CoV-2 infection, do not rule out co-infections with other pathogens, and should not be used as the sole basis for treatment or other patient management decisions. Negative results must be combined with clinical observations, patient history, and epidemiological information. The expected result is Negative.  Fact Sheet for Patients: SugarRoll.be  Fact Sheet for Healthcare  Providers: https://www.woods-mathews.com/  This test is not yet approved or cleared by the [place] FDA and  has been authorized for detection and/or diagnosis of SARS-CoV-2 by FDA under an Emergency Use Authorization (EUA). This EUA will remain  in effect (meaning this test can be used) for the duration of the COVID-19 declaration under Se ction 564(b)(1) of the Act, 21 U.S.C. section 360bbb-3(b)(1), unless the authorization is terminated or revoked sooner.  Performed at [facility], [place] [address] [place] [address]          Radiology Studies: No results found.      Scheduled Meds:  Chlorhexidine Gluconate Cloth  6 each Topical Daily   feeding supplement  1 Container Oral TID BM   metoprolol tartrate  2.5 mg Intravenous Q6H   mometasone-formoterol  2 puff Inhalation BID   pantoprazole (PROTONIX) IV  40 mg Intravenous Q12H   sodium chloride flush  10-40 mL Intracatheter Q12H   umeclidinium bromide  1 puff Inhalation Daily   Continuous Infusions:  ciprofloxacin 400 mg (03/05/21 1702)   metronidazole 500 mg (03/06/21 1626)   TPN ADULT (ION) 80 mL/hr at 03/06/21 1703     LOS: 4 days    Time spent: 25 minutes    Edwin Dada, MD [facility] 03/06/2021, 5:38 PM     Please page though Miguel Barrera or Epic secure chat:  For [employer], Adult nurse

## 2021-03-06 NOTE — Progress Notes (Signed)
          Daily Rounding Note  03/06/2021, 3:22 PM  LOS: 4 days   SUBJECTIVE:   Chief complaint:    GMO, duodenal diverticulitis suspected.  NGT clamped around 4 PM yesterday but remains in place.  Patient denies nausea.  No abdominal pain.  Tolerated some [place] ice a few hours ago  OBJECTIVE:         Vital signs in last 24 hours:    Temp:  [97.7 F (36.5 C)-98.8 F (37.1 C)] 97.8 F (36.6 C) (06/15 0734) Pulse Rate:  [81-127] 112 (06/15 1208) Resp:  [17-22] 17 (06/15 0734) BP: (90-184)/(41-171) 112/61 (06/15 1208) SpO2:  [100 %] 100 % (06/15 0855) Weight:  [69.8 kg] 69.8 kg (06/15 0610) Last BM Date: 02/26/21 Filed Weights   03/04/21 0343 03/05/21 0347 03/06/21 0610  Weight: 70.5 kg 72.1 kg 69.8 kg   General: Looks tired but comfortable.  NGT clamped. Heart: RRR Chest: Rhonchi on the right base.  Crackles on the left base.  No labored breathing. Abdomen: Soft, nondistended.  Active bowel sounds.  No tenderness. Extremities: No CCE Neuro/Psych: Pleasant, appropriate.  Not confused.  Moves all 4 limbs.  I removed the NG tube after finishing the physical exam  Intake/Output from previous day: 06/14 0701 - 06/15 0700 In: 3535.2 [I.V.:2364.2; IV Piggyback:1171] Out: 1000 [Urine:700; Emesis/NG output:300]  Intake/Output this shift: Total I/O In: 10 [I.V.:10] Out: -   Lab Results: Recent Labs    03/04/21 0500  WBC 10.6*  HGB 12.9  HCT 40.0  PLT 142*   BMET Recent Labs    03/04/21 0500 03/05/21 0910 03/06/21 0321  NA 143 141 142  K 3.5 3.9 4.1  CL 103 108 109  CO2 32 26 26  GLUCOSE 144* 115* 115*  BUN 29* 30* 40*  CREATININE 1.27* 1.14* 1.25*  CALCIUM 9.2 8.6* 8.6*   LFT Recent Labs    03/04/21 0500 03/05/21 0910 03/06/21 0321  PROT 5.7* 5.1* 4.7*  ALBUMIN 2.6* 2.3* 2.1*  AST $Re'20 17 16  'ANe$ ALT $R'10 11 10  'nV$ ALKPHOS 58 47 38  BILITOT 1.8* 0.8 0.9   PT/INR No results for input(s): LABPROT, INR in  the last 72 hours. Hepatitis Panel No results for input(s): HEPBSAG, HCVAB, HEPAIGM, HEPBIGM in the last 72 hours.  Studies/Results: No results found.  ASSESMENT:      GOO.  Suspect duodenal diverticulosis.  Day 3 Cipro/Flagyl. Clamped NG tube afternoon 6/14, just now removed.  Tolerated frozen [place] ice within the last few hours.      AKI/CKD   PLAN      Observe tolerance to clear liquids overnight.      Recheck CBC and INR in a.m. along with c-Met.       leave TNA in place until proven patient can meet most nutritional needs through oral intake.  Continue Protonix 40 IV bid.     Azucena Freed  03/06/2021, 3:22 PM Phone [phone]

## 2021-03-06 NOTE — Progress Notes (Signed)
Pt has persistent nausea and vomiting with green bile emesis. We are able to record emesis out put > 300 ml, but unable to measure the significant amount on her blanket and on her gown.  Zofran was administered RN day shift. Symptom has not been relieved. Pt complained having abdominal discomfort and abdominal distention. Her HR has been 125-130, Afib with RVR. Pt appeared restless and diaphoresis.RR 20, Temp  98.3 F, BP 104/44 -108/50 mmHg.   I notified Dr. Myna Hidalgo, the on call provider.  Order received for Metoprolol 25 g IV once and NG tube placement.   NG tube 14 Fr was inserted on right side without any difficulties. Confirmed marking by auscultation and x-ray. We connected with low intermittent suction.  We will continue to monitor.  Kennyth Lose, RN

## 2021-03-06 NOTE — [place] FL2 (Signed)
[place] MEDICAID FL2 LEVEL OF CARE SCREENING TOOL     IDENTIFICATION  Patient Name: [name] [name] Birthdate: 02-23-1927 Sex: female Admission Date (Current Location): 03/02/2021  [facility] and [place] Number:  Herbalist and Address:  [facility] [place], [place] [address] [place], [place] [address]      Provider Number: [id number]  Attending Physician Name and Address:  Edwin Dada, *  Relative Name and Phone Number:  Lauris Poag Daughter     [phone]    Current Level of Care: Hospital Recommended Level of Care: [place] Prior Approval Number: [id number]  Date Approved/Denied:   PASRR Number: [id number]  Discharge Plan: SNF    Current Diagnoses: Patient Active Problem List   Diagnosis Date Noted   Goals of care, counseling/discussion    Partial gastric outlet obstruction 03/02/2021   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   Abdominal pain 02/26/2021   Nausea vomiting and diarrhea 02/26/2021   COPD (chronic obstructive pulmonary disease) (HCC)    DVT (deep venous thrombosis) (HCC)    CAD (coronary artery disease)    Chronic diastolic CHF (congestive heart failure) (HCC)    Elevated lactic acid level    CKD (chronic kidney disease), stage IIIa    Esophageal stricture 07/12/2018   Osteoporosis 07/12/2018   Urge urinary incontinence 07/12/2018   Hypersensitivity pneumonitis due to unspecified organic dust ([place]) 05/18/2018   VHD (valvular heart disease) 05/18/2018   Chest discomfort 03/16/2017   Atrial fibrillation with RVR ([place]) 03/23/2015   SOB (shortness of breath) 03/06/2014   Essential (primary) hypertension 12/29/2013   Hyperlipidemia, unspecified 12/29/2013   Pacemaker 10/26/2007    Orientation RESPIRATION BLADDER Height & Weight     Self, Time, Situation, Place  Normal Incontinent Weight: 153 lb 14.1 oz (69.8 kg) Height:  5\' 3"  (160 cm)  BEHAVIORAL SYMPTOMS/MOOD NEUROLOGICAL BOWEL NUTRITION STATUS       Continent Diet (See d/c summary)  AMBULATORY STATUS COMMUNICATION OF NEEDS Skin   Extensive Assist Verbally Normal                       Personal Care Assistance Level of Assistance  Bathing, Feeding, Dressing Bathing Assistance: Limited assistance Feeding assistance: Independent Dressing Assistance: Limited assistance     Functional Limitations Info  Sight, Hearing, Speech Sight Info: Impaired Hearing Info: Adequate Speech Info: Adequate    SPECIAL CARE FACTORS FREQUENCY  PT (By licensed PT), OT (By licensed OT)     PT Frequency: 5x/week OT Frequency: 5x/week            Contractures Contractures Info: Not present    Additional Factors Info  Code Status, Allergies Code Status Info: DNR Allergies Info: Beta Adrenergic Blockers, penicillins, amoxicillin, Meloxicam, naproxon, nsaids           Current Medications (03/06/2021):  This is the current hospital active medication list Current Facility-Administered Medications  Medication Dose Route Frequency Provider Last Rate Last Admin   acetaminophen (TYLENOL) tablet 650 mg  650 mg Oral Q6H PRN [place] Donnamarie Poag, DO       ALPRAZolam Duanne Moron) tablet 0.25 mg  0.25 mg Oral TID PRN Shela Leff, MD   0.25 mg at 03/03/21 2120   Chlorhexidine Gluconate Cloth 2 % PADS 6 each  6 each Topical Daily [place] Eric J, DO   6 each at 03/06/21 0819   ciprofloxacin (CIPRO) IVPB 400 mg  400 mg Intravenous Q24H Vena Rua, PA-C 200 mL/hr at 03/05/21  1702 400 mg at 03/05/21 1702   ipratropium-albuterol (DUONEB) 0.5-2.5 (3) MG/3ML nebulizer solution 3 mL  3 mL Nebulization Q6H PRN [place] Eric J, DO       ketorolac (TORADOL) 15 MG/ML injection 15 mg  15 mg Intravenous Q6H PRN [place] Donnamarie Poag, DO   15 mg at 03/06/21 0819   metroNIDAZOLE (FLAGYL) IVPB 500 mg  500 mg Intravenous Q12H Vena Rua, PA-C 100 mL/hr at 03/06/21 0459 500 mg at 03/06/21 0459   mometasone-formoterol (DULERA) 200-5 MCG/ACT inhaler 2 puff  2 puff  Inhalation BID [place] Eric J, DO   2 puff at 03/06/21 0855   morphine 2 MG/ML injection 1 mg  1 mg Intravenous Q4H PRN [place] Donnamarie Poag, DO       ondansetron [facility]) tablet 4 mg  4 mg Oral Q6H PRN [place] Eric J, DO       Or   ondansetron [facility]) injection 4 mg  4 mg Intravenous Q6H PRN [place] Donnamarie Poag, DO   4 mg at 03/06/21 0834   pantoprazole (PROTONIX) injection 40 mg  40 mg Intravenous Q12H Mansouraty, Telford Nab., MD   40 mg at 03/06/21 0819   sodium chloride flush (NS) 0.9 % injection 10-40 mL  10-40 mL Intracatheter Q12H [place] Donnamarie Poag, DO   10 mL at 03/06/21 0820   sodium chloride flush (NS) 0.9 % injection 10-40 mL  10-40 mL Intracatheter PRN [place] Donnamarie Poag, DO       TPN ADULT (ION)   Intravenous Continuous TPN Dimple Nanas, RPH 80 mL/hr at 03/05/21 1710 New Bag at 03/05/21 1710   TPN ADULT (ION)   Intravenous Continuous TPN Dimple Nanas, RPH       umeclidinium bromide (INCRUSE ELLIPTA) 62.5 MCG/INH 1 puff  1 puff Inhalation Daily [place] Donnamarie Poag, DO   1 puff at 03/06/21 7408     Discharge Medications: Please see discharge summary for a list of discharge medications.  Relevant Imaging Results:  Relevant Lab Results:   Additional Information SSN [id number] Pt is covid vaccinated x3  [employer], LCSW

## 2021-03-06 NOTE — TOC Initial Note (Addendum)
Transition of Care [facility]) - Initial/Assessment Note    Patient Details  Name: [name] [name] MRN: [id number] Date of Birth: 06/22/1927  Transition of Care [facility]) CM/SW Contact:    Bethann Berkshire, [place] Phone Number: 03/06/2021, 12:06 PM  Clinical Narrative:                  CSW met with pt and pt daughter Vicky bedside to discuss PT recommendation for SNF. Pt and daughter are agreeable to SNF workup. Pt lives at home alone but with support from family. She has 3 daughters that alternate every 10 days sleeping at her house. If daughters are not there during the day, they have a friend that assists with supervision. Pt has DME including cane and a walker. Prior to admission daughter reports pt had independent in home mobility with use of cane or walker. Daughter states pt was able to ambulate to the mailbox and back which was about 100 feet away. CSW explained insurance auth process. Daughter states that she received recommendations from friends for facilities including [facility] and [facility]. CSW also provided pt and pt daughter with Medicare SNF list. Pt is covid vaccinated x3 CSW will complete fl2 and fax bed requests in the hub.   Expected Discharge Plan: Skilled Nursing Facility Barriers to Discharge: Continued Medical Work up   Patient Goals and CMS Choice Patient states their goals for this hospitalization and ongoing recovery are:: Return to being independent CMS Medicare.gov Compare Post Acute Care list provided to:: Patient Choice offered to / list presented to : Patient, Adult Children  Expected Discharge Plan and Services Expected Discharge Plan: [place] Acute Care Choice: [place] Living arrangements for the past 2 months: Single Family Home                                      Prior Living Arrangements/Services Living arrangements for the past 2 months: Single Family Home Lives with:: Self, Adult Children Patient language and  need for interpreter reviewed:: Yes        Need for Family Participation in Patient Care: Yes (Comment) Care giver support system in place?: Yes (comment) Current home services: DME Criminal Activity/Legal Involvement Pertinent to Current Situation/Hospitalization: No - Comment as needed  Activities of Daily Living Home Assistive Devices/Equipment: Cane (specify quad or straight) ADL Screening (condition at time of admission) Patient's cognitive ability adequate to safely complete daily activities?: Yes Is the patient deaf or have difficulty hearing?: No Does the patient have difficulty seeing, even when wearing glasses/contacts?: No Does the patient have difficulty concentrating, remembering, or making decisions?: No Patient able to express need for assistance with ADLs?: No Does the patient have difficulty dressing or bathing?: No Independently performs ADLs?: Yes (appropriate for developmental age) Does the patient have difficulty walking or climbing stairs?: No Weakness of Legs: Both Weakness of Arms/Hands: Both  Permission Sought/Granted   Permission granted to share information with : Yes, Verbal Permission Granted  Share Information with NAME: Her daughters           Emotional Assessment Appearance:: Appears stated age Attitude/Demeanor/Rapport: Engaged Affect (typically observed): Accepting, Appropriate Orientation: : Oriented to Self, Oriented to  Time, Oriented to Place, Oriented to Situation Alcohol / Substance Use: Not Applicable Psych Involvement: No (comment)  Admission diagnosis:  Abdominal pain [R10.9] Abdominal mass [R19.00] A-fib (HCC) [I48.91] Partial gastric  outlet obstruction [K31.1] Patient Active Problem List   Diagnosis Date Noted   Goals of care, counseling/discussion    Partial gastric outlet obstruction 03/02/2021   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   Abdominal pain 02/26/2021   Nausea vomiting and diarrhea 02/26/2021   COPD (chronic obstructive  pulmonary disease) (HCC)    DVT (deep venous thrombosis) (HCC)    CAD (coronary artery disease)    Chronic diastolic CHF (congestive heart failure) (HCC)    Elevated lactic acid level    CKD (chronic kidney disease), stage IIIa    Esophageal stricture 07/12/2018   Osteoporosis 07/12/2018   Urge urinary incontinence 07/12/2018   Hypersensitivity pneumonitis due to unspecified organic dust ([place]) 05/18/2018   VHD (valvular heart disease) 05/18/2018   Chest discomfort 03/16/2017   Atrial fibrillation with RVR ([place]) 03/23/2015   SOB (shortness of breath) 03/06/2014   Essential (primary) hypertension 12/29/2013   Hyperlipidemia, unspecified 12/29/2013   Pacemaker 10/26/2007   PCP:  Tracie Harrier, MD Pharmacy:   [id number] [address] [id number]: [phone]: [phone]    Social Determinants of Health (SDOH) Interventions    Readmission Risk Interventions No flowsheet data found.

## 2021-03-07 ENCOUNTER — Encounter: Payer: Self-pay | Admitting: Gastroenterology

## 2021-03-07 DIAGNOSIS — R933 Abnormal findings on diagnostic imaging of other parts of digestive tract: Secondary | ICD-10-CM

## 2021-03-07 DIAGNOSIS — K2980 Duodenitis without bleeding: Secondary | ICD-10-CM

## 2021-03-07 DIAGNOSIS — R1084 Generalized abdominal pain: Secondary | ICD-10-CM

## 2021-03-07 DIAGNOSIS — Z978 Presence of other specified devices: Secondary | ICD-10-CM

## 2021-03-07 LAB — CBC
HCT: 36.9 % (ref 36.0–46.0)
Hemoglobin: 11.8 g/dL — ABNORMAL LOW (ref 12.0–15.0)
MCH: 31.7 pg (ref 26.0–34.0)
MCHC: 32 g/dL (ref 30.0–36.0)
MCV: 99.2 fL (ref 80.0–100.0)
Platelets: 121 10*3/uL — ABNORMAL LOW (ref 150–400)
RBC: 3.72 MIL/uL — ABNORMAL LOW (ref 3.87–5.11)
RDW: 15.1 % (ref 11.5–15.5)
WBC: 7.4 10*3/uL (ref 4.0–10.5)
nRBC: 0 % (ref 0.0–0.2)

## 2021-03-07 LAB — COMPREHENSIVE METABOLIC PANEL
ALT: 9 U/L (ref 0–44)
AST: 18 U/L (ref 15–41)
Albumin: 2.3 g/dL — ABNORMAL LOW (ref 3.5–5.0)
Alkaline Phosphatase: 50 U/L (ref 38–126)
Anion gap: 5 (ref 5–15)
BUN: 48 mg/dL — ABNORMAL HIGH (ref 8–23)
CO2: 24 mmol/L (ref 22–32)
Calcium: 8.9 mg/dL (ref 8.9–10.3)
Chloride: 112 mmol/L — ABNORMAL HIGH (ref 98–111)
Creatinine, Ser: 1.25 mg/dL — ABNORMAL HIGH (ref 0.44–1.00)
GFR, Estimated: 41 mL/min — ABNORMAL LOW (ref >60)
Glucose, Bld: 104 mg/dL — ABNORMAL HIGH (ref 70–99)
Potassium: 4.7 mmol/L (ref 3.5–5.1)
Sodium: 141 mmol/L (ref 135–145)
Total Bilirubin: 0.9 mg/dL (ref 0.3–1.2)
Total Protein: 5.2 g/dL — ABNORMAL LOW (ref 6.5–8.1)

## 2021-03-07 LAB — PROTIME-INR
INR: 1.2 (ref 0.8–1.2)
Prothrombin Time: 15.4 seconds — ABNORMAL HIGH (ref 11.4–15.2)

## 2021-03-07 LAB — MAGNESIUM: Magnesium: 1.9 mg/dL (ref 1.7–2.4)

## 2021-03-07 LAB — PHOSPHORUS: Phosphorus: 3.7 mg/dL (ref 2.5–4.6)

## 2021-03-07 MED ORDER — ONDANSETRON HCL 4 MG PO TABS: 4.0000 mg | ORAL_TABLET | Freq: Four times a day (QID) | ORAL | Status: DC | PRN

## 2021-03-07 MED ORDER — TRAVASOL 10 % IV SOLN
INTRAVENOUS | Status: AC
  Filled 2021-03-07: qty 960

## 2021-03-07 MED ORDER — SODIUM CHLORIDE 0.9 % IV SOLN: INTRAVENOUS | Status: DC

## 2021-03-07 MED ORDER — ALPRAZOLAM 0.25 MG PO TABS
0.2500 mg | ORAL_TABLET | Freq: Three times a day (TID) | ORAL | Status: DC | PRN
  Administered 2021-03-07: 0.25 mg
  Filled 2021-03-07: qty 1

## 2021-03-07 MED ORDER — METOPROLOL TARTRATE 5 MG/5ML IV SOLN
5.0000 mg | Freq: Four times a day (QID) | INTRAVENOUS | Status: DC
  Administered 2021-03-07 – 2021-03-09 (×9): 5 mg via INTRAVENOUS
  Filled 2021-03-07 (×9): qty 5

## 2021-03-07 MED ORDER — ONDANSETRON HCL 4 MG/2ML IJ SOLN
4.0000 mg | Freq: Four times a day (QID) | INTRAMUSCULAR | Status: DC | PRN
  Administered 2021-03-07: 4 mg via INTRAVENOUS
  Filled 2021-03-07: qty 2

## 2021-03-07 MED ORDER — ACETAMINOPHEN 160 MG/5ML PO SOLN: 650.0000 mg | Freq: Four times a day (QID) | ORAL | Status: DC | PRN

## 2021-03-07 NOTE — Progress Notes (Signed)
Nutrition Follow-up  DOCUMENTATION CODES:   Not applicable  INTERVENTION:   -D/c Boost Breeze po TID, each supplement provides 250 kcal and 9 grams of protein  -TPN management per pharmacy  NUTRITION DIAGNOSIS:   Inadequate oral intake related to altered GI function as evidenced by NPO status.  Ongoing  GOAL:   Patient will meet greater than or equal to 90% of their needs  Met with TPN  MONITOR:   Diet advancement, Labs, Weight trends, Skin, I & O's  REASON FOR ASSESSMENT:   Consult New TPN/TNA  ASSESSMENT:   [name] [name] is a 87 y.o. female with medical history significant of essential hypertension, COPD, CKD stage IIIa, SSS s/p PPM, paroxysmal atrial fibrillation on Coumadin, CAD, chronic diastolic congestive heart failure, hyperlipidemia, GERD, peripheral vascular disease, distant history of breast cancer who presents as a transfer from [facility] for partial gastric outlet obstruction and duodenal mass.  6/12- TPN initiated, PICC placed 6/13- s/p EGD/ EUS- revealed gastric ulcers from NGT trauma, duodenal stenosis with associated inflammation/ erythema (biopsied)  6/15- NGT removed, advanced to clear liquid diet, NGT replaced secondary to nausea and vomiting, downgraded to NPO  Reviewed I/O's: +1.4 L x 24 hours and +3.5 L since admission  NGT output: 950 ml x 24 hours   Pt now NPO. She no longer has nausea and vomiting now that NGT has been replaced.  Per general surgery notes, plan for potential gastrojejunostomy placement pending biopsy results and goals of care. Palliative continues to follow.   Pt receiving TPN at 80 ml/hr, which provides 1906 kcals and 96 grams protein, meeting 100% of estimated kcal needs.    Labs reviewed: CBGS: 117-120.   Diet Order:   Diet Order             Diet NPO time specified  Diet effective now                   EDUCATION NEEDS:   Education needs have been addressed  Skin:  Skin  Assessment: Reviewed RN Assessment  Last BM:  02/26/21  Height:   Ht Readings from Last 1 Encounters:  03/05/21 _0  (1.6 m)    Weight:   Wt Readings from Last 1 Encounters:  03/07/21 70.6 kg    Ideal Body Weight:  52.3 kg  BMI:  Body mass index is 27.58 kg/m.  Estimated Nutritional Needs:   Kcal:  2787-1836  Protein:  90-105 grams  Fluid:  > 1.7 L    Loistine Chance, RD, LDN, [place] Registered Dietitian II Certified Diabetes Care and Education Specialist Please refer to [facility] for RD and/or RD on-call/weekend/after hours pager

## 2021-03-07 NOTE — Progress Notes (Signed)
Daily Rounding Note  03/07/2021, 1:07 PM  LOS: 5 days   SUBJECTIVE:   Chief complaint:     NGT replaced overnight in setting of 300 cc of emesis and subsequent 900 cc NG tube output after tube replaced.  Pt denies current abdominal pain or nausea.  Passing flatus occasionally.  Feels tired, weak.  OBJECTIVE:         Vital signs in last 24 hours:    Temp:  [97.4 F (36.3 C)-99.1 F (37.3 C)] 97.4 F (36.3 C) (06/16 1135) Pulse Rate:  [106-128] 114 (06/16 1135) Resp:  [14-20] 20 (06/16 1135) BP: (94-131)/(32-97) 103/79 (06/16 1135) SpO2:  [96 %-100 %] 99 % (06/16 1135) Weight:  [70.6 kg] 70.6 kg (06/16 0300) Last BM Date: 02/26/21 Filed Weights   03/05/21 0347 03/06/21 0610 03/07/21 0300  Weight: 72.1 kg 69.8 kg 70.6 kg   General: Looks exhausted. Heart: Irregularly irregular.  Soft systolic murmur Chest: No labored breathing, no cough but overall shallow breathing. Abdomen: Soft, bowel sounds absent.  Nontender or distended.  NG tube in place and suctioning out clear medium brown watery effluent. Extremities: No CCE. Neuro/Psych: Cooperative, follows commands.  Intake/Output from previous day: 06/15 0701 - 06/16 0700 In: 2321.7 [I.V.:2121.6; IV Piggyback:200] Out: 950 [Emesis/NG output:950]  Intake/Output this shift: Total I/O In: -  Out: 625 [Urine:325; Emesis/NG output:300]  Lab Results: Recent Labs    03/07/21 0313  WBC 7.4  HGB 11.8*  HCT 36.9  PLT 121*   BMET Recent Labs    03/05/21 0910 03/06/21 0321 03/07/21 0313  NA 141 142 141  K 3.9 4.1 4.7  CL 108 109 112*  CO2 26 26 24   GLUCOSE 115* 115* 104*  BUN 30* 40* 48*  CREATININE 1.14* 1.25* 1.25*  CALCIUM 8.6* 8.6* 8.9   LFT Recent Labs    03/05/21 0910 03/06/21 0321 03/07/21 0313  PROT 5.1* 4.7* 5.2*  ALBUMIN 2.3* 2.1* 2.3*  AST 17 16 18   ALT 11 10 9   ALKPHOS 47 38 50  BILITOT 0.8 0.9 0.9   PT/INR Recent Labs     03/07/21 0313  LABPROT 15.4*  INR 1.2   Hepatitis Panel No results for input(s): HEPBSAG, HCVAB, HEPAIGM, HEPBIGM in the last 72 hours.  Studies/Results: DG Abd Portable 1V  Result Date: 03/07/2021 CLINICAL DATA:  NG tube placement EXAM: PORTABLE ABDOMEN - 1 VIEW COMPARISON:  Radiograph 03/03/2021, 03/01/2021 FINDINGS: Transesophageal tube tip terminates in the left upper quadrant within an air-filled stomach. Side port is positioned at the GE junction. Consider advancing 3-5 cm for optimal positioning. Left chest wall pacer pack with leads at the right atrium and cardiac apex. Telemetry leads overlie the chest. Some persistent streaky opacities in the bilateral lung bases. Stable cardiomediastinal contours with a calcified, tortuous aorta. Air distended appearance of the stomach. IMPRESSION: Transesophageal tube side port at the GE junction. Consider advancing 3-5 cm for optimal positioning. Nonspecific gastric air distension. Atelectatic changes and/or scarring in the lung bases. Aortic Atherosclerosis (ICD10-I70.0). Electronically Signed   By: Lovena Le M.D.   On: 03/07/2021 00:13    Scheduled Meds:  Chlorhexidine Gluconate Cloth  6 each Topical Daily   metoprolol tartrate  5 mg Intravenous Q6H   mometasone-formoterol  2 puff Inhalation BID   pantoprazole (PROTONIX) IV  40 mg Intravenous Q12H   sodium chloride flush  10-40 mL Intracatheter Q12H   umeclidinium bromide  1 puff Inhalation Daily  Continuous Infusions:  sodium chloride 10 mL/hr at 03/07/21 0307   ciprofloxacin 400 mg (03/06/21 1803)   metronidazole 500 mg (03/07/21 0316)   TPN ADULT (ION) 80 mL/hr at 03/06/21 1703   TPN ADULT (ION)     PRN Meds:.acetaminophen, ALPRAZolam, ipratropium-albuterol, ketorolac, morphine injection, ondansetron **OR** ondansetron (ZOFRAN) IV, sodium chloride flush   ASSESMENT:      G00.  Suspect duodenal diverticulitis.  Day 4 Cipro/Flagyl.  NG tube clamped 6/14 and then removed yesterday  afternoon 6/15.  TNA, bid IV Protonix remain in place.  WBCs normalized.  Dr Bobbye Morton following.         AKI, CKD.  Stable.       Thrombocytopenia.     Coagulopathy.  INR 2.3 >> 1.2.      A fib.  No AC at home or now.     PLAN      Dr. Bobbye Morton actively following pt.   Palliative care consulted to discuss goals of care.  Given emesis and NG tube replacement overnight, surgery plans discussion with patient, family.  Options would be hospice/comfort care approach vs palliative surgical bypass.  Not a candidate for resection of duodenal mass.    [name] [name]  03/07/2021, 1:07 PM Phone [phone]

## 2021-03-07 NOTE — Progress Notes (Signed)
Pt has no more nausea and vomiting after NG tube placement. Her total gastric content is >950 ml, which not include the large amount of emesis on her gown and blanket. Pt stated she has no more abdominal pain and no more distention. HR 118-128, Afib on monitor. Metoprolol 25 mg q 6 hr per schedule. We will monitor.  Kennyth Lose, RN

## 2021-03-07 NOTE — Progress Notes (Signed)
Occupational Therapy Treatment Patient Details Name: [name] [name] MRN: [id number] DOB: [birth year] Today's Date: 03/07/2021    History of present illness Pt is a 87 y.o. female admitted 6/7 to [facility] with N/V abdominal pain and partial gastric outlet obstruction with duodenal mass. NG tube placed and pt underwent EGD with noted deformity of duodenum w/ biopsy though no obvious mass. 6/11 pt transferred to [facility], 6/13 endoscopy. PMhx: HTN, COPD, CKD, SSS s/p PPM, a fib, CAD, CHF, HLD, GERD, PVD, breast cancer.   OT comments  Pt progressing well towards OT goals, able to mobilize to sink and complete ADLs standing at sink with intermittent seated rest breaks. Pt requires Min A for transfers and mobility for RW maneuvering. Pt requires Mod A overall for LB ADLs during session today. HR up to 147bpm with activity but sustained 130s. SpO2 WFL on RA. Pt's daughter present and engaged throughout session. Continue to recommend SNF for short term rehab.    Follow Up Recommendations  SNF;Supervision - Intermittent    Equipment Recommendations  3 in 1 bedside commode    Recommendations for Other Services      Precautions / Restrictions Precautions Precautions: Fall;Other (comment) Precaution Comments: NG tube Restrictions Weight Bearing Restrictions: No       Mobility Bed Mobility Overal bed mobility: Needs Assistance Bed Mobility: Supine to Sit     Supine to sit: Min assist;HOB elevated     General bed mobility comments: Min A for trunk assist    Transfers Overall transfer level: Needs assistance Equipment used: Rolling walker (2 wheeled) Transfers: Sit to/from Stand Sit to Stand: Min assist         General transfer comment: Light Min A for sit to stand transfers from bedside, chair at sink with cues for hand placement needed    Balance Overall balance assessment: Needs assistance Sitting-balance support: No upper extremity supported;Feet  supported Sitting balance-Leahy Scale: Fair     Standing balance support: Bilateral upper extremity supported;During functional activity;Single extremity supported Standing balance-Leahy Scale: Poor Standing balance comment: reliant on at least one UE support in standing at sink                           ADL either performed or assessed with clinical judgement   ADL Overall ADL's : Needs assistance/impaired     Grooming: Min guard;Standing;Oral care;Wash/dry face;Sitting;Brushing hair Grooming Details (indicate cue type and reason): Completed various grooming tasks sitting/standing at sink. min guard in standing for safety due to LE weakness for oral care via mouth wash/swab and washing face - cues to mind NG tube. Shampoo cap completed sitting at sink with daughter engaged in assisting as well                     Toileting- Water quality scientist and Hygiene: Moderate assistance;Sit to/from stand Toileting - Clothing Manipulation Details (indicate cue type and reason): Assist for clothing mgmt and hygiene - pt with urine incontinence standing at sink - brought BSC up behind pt for completion of urination       General ADL Comments: limited by decreased standing endurance and balance - but able to progress mobility in room and standing at sink for tasks with intermittent seated rest breaks     Vision   Vision Assessment?: No apparent visual deficits   Perception     Praxis      Cognition Arousal/Alertness: Awake/alert Behavior During Therapy: [facility]  for tasks assessed/performed Overall Cognitive Status: Impaired/Different from baseline Area of Impairment: Safety/judgement                         Safety/Judgement: Decreased awareness of deficits;Decreased awareness of safety     General Comments: some decreased awareness of deficits, but pleasant and participatory. follows all directions        Exercises     Shoulder Instructions        General Comments SpO2 high 90s on RA. HR initially 120 but with progressed activity, up to 147bpm (during toileting task) but able to mobilize around bed to chair with HR 130s. Daughter present, assisting and engaged in pt's care    Pertinent Vitals/ Pain       Pain Assessment: No/denies pain  Home Living                                          Prior Functioning/Environment              Frequency  Min 2X/week        Progress Toward Goals  OT Goals(current goals can now be found in the care plan section)  Progress towards OT goals: Progressing toward goals  Acute Rehab OT Goals Patient Stated Goal: drink something, go home OT Goal Formulation: With patient/family Time For Goal Achievement: 03/19/21 Potential to Achieve Goals: Good ADL Goals Pt Will Perform Grooming: with set-up;standing Pt Will Perform Lower Body Dressing: with min guard assist;sit to/from stand;sitting/lateral leans Pt Will Transfer to Toilet: with min guard assist;ambulating Pt Will Perform Toileting - Clothing Manipulation and hygiene: with min assist;sitting/lateral leans;sit to/from stand Pt/caregiver will Perform Home Exercise Program: Increased strength;Both right and left upper extremity;With theraband;With Supervision;With written HEP provided Additional ADL Goal #1: Pt to verbalize at least 3 energy conservation strategies to implement during ADLs/mobility  Plan Discharge plan remains appropriate    Co-evaluation                 AM-PAC OT "6 Clicks" Daily Activity     Outcome Measure   Help from another person eating meals?: A Little Help from another person taking care of personal grooming?: A Little Help from another person toileting, which includes using toliet, bedpan, or urinal?: A Lot Help from another person bathing (including washing, rinsing, drying)?: A Lot Help from another person to put on and taking off regular upper body clothing?: A Little Help  from another person to put on and taking off regular lower body clothing?: A Lot 6 Click Score: 15    End of Session Equipment Utilized During Treatment: Gait belt;Rolling walker  OT Visit Diagnosis: Unsteadiness on feet (R26.81);Other abnormalities of gait and mobility (R26.89);Muscle weakness (generalized) (M62.81)   Activity Tolerance Patient tolerated treatment well   Patient Left in chair;with call bell/phone within reach;with chair alarm set;with family/visitor present;with nursing/sitter in room   Nurse Communication Mobility status;Other (comment) (urine measurement)        Time: 5188-4166 OT Time Calculation (min): 47 min  Charges: OT General Charges $OT Visit: 1 Visit OT Treatments $Self Care/Home Management : 38-52 mins  Malachy Chamber, OTR/L Acute Rehab Services Office: [phone]    Layla Maw 03/07/2021, 9:23 AM

## 2021-03-07 NOTE — Progress Notes (Addendum)
General Surgery Follow Up Note  Subjective:    Overnight Issues: emesis, NGT replaced o/n, 300cc emesis and 900cc NGT o/p documented after placement  Objective:  Vital signs for last 24 hours: Temp:  [97.6 F (36.4 C)-99.1 F (37.3 C)] 97.6 F (36.4 C) (06/16 0745) Pulse Rate:  [106-128] 108 (06/16 0745) Resp:  [14-20] 17 (06/16 0745) BP: (94-147)/(32-97) 94/57 (06/16 0745) SpO2:  [96 %-100 %] 100 % (06/16 0907) Weight:  [70.6 kg] 70.6 kg (06/16 0300)  Hemodynamic parameters for last 24 hours:    Intake/Output from previous day: 06/15 0701 - 06/16 0700 In: 2321.7 [I.V.:2121.6; IV Piggyback:200] Out: 950 [Emesis/NG output:950]  Intake/Output this shift: Total I/O In: -  Out: 300 [Emesis/NG output:300]  Vent settings for last 24 hours:    Physical Exam:  Gen: comfortable, no distress Neuro: non-focal exam HEENT: PERRL Neck: supple CV: RRR Pulm: unlabored breathing Abd: soft, NT, NGT with bilious GU: clear yellow urine Extr: wwp, no edema   Results for orders placed or performed during the hospital encounter of 03/02/21 (from the past 24 hour(s))  Comprehensive metabolic panel     Status: Abnormal   Collection Time: 03/07/21  3:13 AM  Result Value Ref Range   Sodium 141 135 - 145 mmol/L   Potassium 4.7 3.5 - 5.1 mmol/L   Chloride 112 (H) 98 - 111 mmol/L   CO2 24 22 - 32 mmol/L   Glucose, Bld 104 (H) 70 - 99 mg/dL   BUN 48 (H) 8 - 23 mg/dL   Creatinine, Ser 1.25 (H) 0.44 - 1.00 mg/dL   Calcium 8.9 8.9 - 10.3 mg/dL   Total Protein 5.2 (L) 6.5 - 8.1 g/dL   Albumin 2.3 (L) 3.5 - 5.0 g/dL   AST 18 15 - 41 U/L   ALT 9 0 - 44 U/L   Alkaline Phosphatase 50 38 - 126 U/L   Total Bilirubin 0.9 0.3 - 1.2 mg/dL   GFR, Estimated 41 (L) >60 mL/min   Anion gap 5 5 - 15  Magnesium     Status: None   Collection Time: 03/07/21  3:13 AM  Result Value Ref Range   Magnesium 1.9 1.7 - 2.4 mg/dL  Phosphorus     Status: None   Collection Time: 03/07/21  3:13 AM  Result  Value Ref Range   Phosphorus 3.7 2.5 - 4.6 mg/dL  Protime-INR     Status: Abnormal   Collection Time: 03/07/21  3:13 AM  Result Value Ref Range   Prothrombin Time 15.4 (H) 11.4 - 15.2 seconds   INR 1.2 0.8 - 1.2  CBC     Status: Abnormal   Collection Time: 03/07/21  3:13 AM  Result Value Ref Range   WBC 7.4 4.0 - 10.5 K/uL   RBC 3.72 (L) 3.87 - 5.11 MIL/uL   Hemoglobin 11.8 (L) 12.0 - 15.0 g/dL   HCT 36.9 36.0 - 46.0 %   MCV 99.2 80.0 - 100.0 fL   MCH 31.7 26.0 - 34.0 pg   MCHC 32.0 30.0 - 36.0 g/dL   RDW 15.1 11.5 - 15.5 %   Platelets 121 (L) 150 - 400 K/uL   nRBC 0.0 0.0 - 0.2 %    Assessment & Plan:  Present on Admission:  Partial gastric outlet obstruction  Essential (primary) hypertension  Pacemaker  Atrial fibrillation with RVR (HCC)  Abdominal pain  CKD (chronic kidney disease), stage IIIa  COPD (chronic obstructive pulmonary disease) (HCC)  Duodenal mass  LOS: 5 days   Additional comments:I reviewed the patient's new clinical lab test results.   and I reviewed the patients new imaging test results.    Partial gastric outlet obstruction Biliary obstruction Hyperbilirubinemia - TB normalized - CT abdomen/pelvis notable for a duodenal mass at the junction of the second and third part of the duodenum causing dilation proximally and possibly obstructing the distal common bile duct - unable to obtain MRCP due to pacemaker. GI consulted 6/12. Repeat EGD and EUS done 6/13, Bx taken from D2/D3 and path negative for malignancy. GI started abx for possible duodenal diverticulitis, agree with continuing - palliative care consulted to initiate goals of care discussions - given emesis and replacement of NGT o/n, recommend another discussion today to decide between hospice and palliative surgical bypass via open GJ. Discussed that patient is not a candidate for resection of duodenal mass given current state of poor nutrition and extensiveness of surgery.  - Pre-albumin is 9.1.  continue PICC/TPN.   ID - cipro/flagyl day 4 VTE - hold coumadin, recommend chemical DVT ppx with LMWH or SQH. Defer to primary to order. FEN - IVF, NPO/NGT to LIS Foley - none   Atrial fibrillation with RVR COPD Chronic diastolic CHF CAD HTN HLD SSS s/p PPM GERD CKD stage IIIa Code status DNR  Jesusita Oka, MD Trauma & General Surgery Please use [email or web address] to contact on call provider  03/07/2021  *Care during the described time interval was provided by me. I have reviewed this patient's available data, including medical history, events of note, physical examination and test results as part of my evaluation.

## 2021-03-07 NOTE — Progress Notes (Signed)
Daily Progress Note   Patient Name: [name] [name]       Date: 03/07/2021 DOB: December 06, 1926  Age: 87 y.o. MRN#: [id number] Attending Physician: Edwin Dada, * Primary Care Physician: Tracie Harrier, MD Admit Date: 03/02/2021  Reason for Consultation/Follow-up: Establishing goals of care and Psychosocial/spiritual support  Subjective: Medical records reviewed. Assessed patient at the bedside and met with her daughters Treasa School, and Manuela Schwartz for a family meeting to discuss treatment options, prognosis.   [name] is more fatigued and weak today. She reports feeling worse every day. I shared my concern that she was unable to tolerate removal of NGT or clear liquids. Zaynab and her family are also quite concerned. They understand that medical management is not going as well as hoped and they will soon need to decide for surgical intervention vs comfort care and hospice.  Reviewed risks and benefits of palliative surgical bypass. Patient and her daughters continue to express their reservations and uncertainty of whether to proceed with surgery. Outpatient hospice at home and residential hospice were discussed at length. Educated thoroughly on comfort care measures available during hospitalization, emphasizing that artificial nutrition and hydration would no longer be more beneficial than harmful if comfort was the priority. Arville Go has previously visited [facility] and acknowledges the benefits of 24/7 assistance with care. [name] is tearful upon learning that residential hospice eligibility is dependent on a prognosis of 2 weeks or less. Therapeutic listening and emotional support provided. Emphasized the importance of prioritizing and honoring patient's wishes, ensuring she is at peace with her  decision and that she is supported by family.    [name] is quite overwhelmed by the conversation. She wonders if she can continue all her current medications at a rehab facility and restates that she would prefer to "get well enough to do for myself again." Her daughters request to hear her thoughts on proceeding with surgery. She states "I don't know, you three are the bosses." [name] requests more time to discuss with her family and informs me that her plan is to make a decision this evening "so I can get past this and rest." Reassured patient that this PA is available throughout the day and tomorrow to provide support and the team will respect her decisions when she is ready.   Questions and concerns addressed. PMT  will continue to support holistically.  Length of Stay: 5  Current Medications: Scheduled Meds:   Chlorhexidine Gluconate Cloth  6 each Topical Daily   metoprolol tartrate  5 mg Intravenous Q6H   mometasone-formoterol  2 puff Inhalation BID   pantoprazole (PROTONIX) IV  40 mg Intravenous Q12H   sodium chloride flush  10-40 mL Intracatheter Q12H   umeclidinium bromide  1 puff Inhalation Daily    Continuous Infusions:  sodium chloride 10 mL/hr at 03/07/21 0307   ciprofloxacin 400 mg (03/06/21 1803)   metronidazole 500 mg (03/07/21 0316)   TPN ADULT (ION) 80 mL/hr at 03/06/21 1703   TPN ADULT (ION)      PRN Meds: acetaminophen, ALPRAZolam, ipratropium-albuterol, ketorolac, morphine injection, ondansetron **OR** ondansetron (ZOFRAN) IV, sodium chloride flush  Physical Exam Vitals and nursing note reviewed.  Constitutional:      Appearance: She is ill-appearing.     Comments: NGT secured in place.  Cardiovascular:     Rate and Rhythm: Tachycardia present.  Pulmonary:     Effort: Pulmonary effort is normal.  Skin:    General: Skin is warm and dry.  Neurological:     Mental Status: She is oriented to person, place, and time. She is lethargic.             Vital Signs: BP  103/79 (BP Location: Right Leg)   Pulse (!) 114   Temp (!) 97.4 F (36.3 C) (Oral)   Resp 20   Ht _0  (1.6 m)   Wt 70.6 kg   SpO2 99%   BMI 27.58 kg/m  SpO2: SpO2: 99 % O2 Device: O2 Device: Room Air O2 Flow Rate: O2 Flow Rate (L/min): 0 L/min  Intake/output summary:  Intake/Output Summary (Last 24 hours) at 03/07/2021 1259 Last data filed at 03/07/2021 0900 Gross per 24 hour  Intake 2311.67 ml  Output 1575 ml  Net 736.67 ml    LBM: Last BM Date: 02/26/21 Baseline Weight: Weight: 77.9 kg Most recent weight: Weight: 70.6 kg       Palliative Assessment/Data: 40%     Patient Active Problem List   Diagnosis Date Noted   Goals of care, counseling/discussion    Partial gastric outlet obstruction 03/02/2021   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   Abdominal pain 02/26/2021   Nausea vomiting and diarrhea 02/26/2021   COPD (chronic obstructive pulmonary disease) (HCC)    DVT (deep venous thrombosis) (HCC)    CAD (coronary artery disease)    Chronic diastolic CHF (congestive heart failure) (HCC)    Elevated lactic acid level    CKD (chronic kidney disease), stage IIIa    Esophageal stricture 07/12/2018   Osteoporosis 07/12/2018   Urge urinary incontinence 07/12/2018   Hypersensitivity pneumonitis due to unspecified organic dust ([place]) 05/18/2018   VHD (valvular heart disease) 05/18/2018   Chest discomfort 03/16/2017   Atrial fibrillation with RVR ([place]) 03/23/2015   SOB (shortness of breath) 03/06/2014   Essential (primary) hypertension 12/29/2013   Hyperlipidemia, unspecified 12/29/2013   Pacemaker 10/26/2007    Palliative Care Assessment & Plan   Patient Profile: 87 y.o. female  with past medical history of essential hypertension, COPD, CKD stage IIIa, SSS s/p PPM, paroxysmal atrial fibrillation on Coumadin, CAD, chronic diastolic congestive heart failure, hyperlipidemia, GERD, peripheral vascular disease, distant history of breast cancer admitted on 03/02/2021 from [facility] with  partial gastric outlet obstruction and duodenal mass.   NG tube was placed to low wall intermittent suction.  Gastroenterology and general surgery were  consulted and patient underwent EGD and biopsy of duodenum. Palliative care has been consulted to provide support and assist with goals of care discussions as the patient and family are faced with potential treatment options and difficult decision making.  Assessment: Partial gastric outlet obstruction, worsening N/V Duodenal mass Afib with RVR Deconditioning Goals of care discussion  Recommendations/Plan: Continue current interventions for now, patient and family will continue discussing options for surgery vs hospice with decision later this evening or tomorrow morning Psychosocial and emotional support provided PMT will continue to support and facilitate ongoing goals of care discussions  Goals of Care and Additional Recommendations: Limitations on Scope of Treatment: surgical procedures TBD  Prognosis:  Unable to determine  Discharge Planning: To Be Determined  Care plan was discussed with patient, patient's daughters Treasa School and Manuela Schwartz  Total time: 70 minutes Greater than 50% of this time was spent in counseling and coordinating care related to the above assessment and plan.  Dorthy Cooler, PA-C Palliative Medicine Team Team phone # [phone]  Thank you for allowing the Palliative Medicine Team to assist in the care of this patient. Please utilize secure chat with additional questions, if there is no response within 30 minutes please call the above phone number.  Palliative Medicine Team providers are available by phone from 7am to 7pm daily and can be reached through the team cell phone.  Should this patient require assistance outside of these hours, please call the patient's attending physician.

## 2021-03-07 NOTE — Progress Notes (Signed)
[facility] PROGRESS NOTE    GWENITH TSCHIDA  TIR:[id number] DOB: [birth year] DOA: 03/02/2021 PCP: Tracie Harrier, MD      Brief Narrative:  Mrs. Batta is a 87 y.o. F with HTN, COPD on home O2, SSS s/p PPM, pAF on warfarin, CAD, dCHF, CKD IIIa, PVD and breast cancer remote who presented with partial gastric outlet obstruction and duodenal mass.  Patient first developed about 2 weeks of abdominal distention, pain, and nausea vomiting.  In the ER at [facility], CT showed duodenal mass causing proximal dilation and apparently obstructing the distal common bile duct and gastric outlet.  Gastroenterology and general surgery were consulted, EGD showed thickened folds of mucosa in the antrum, and an extrinsic deformity in the second portion of the duodenum which was biopsied.  MRCP was recommended so the patient was transferred to [facility].            Assessment & Plan:  Partial gastric outlet obstruction and biliary obstruction Due to ampullary versus duodenal mass, possible malignancy versus small bowel diverticulitis  Patient presented with vomiting and bowel obstruction found to be from a duodenal mass.  This was thought to be possibly diverticulitis, possible malignancy.  Biopsies have been negative and empiric antibiotics were started in consideration of diverticulitis.  However to date medical therapies have not been successful and the NG tube was removed yesterday with prompt return of symptoms/vomiting.  Palliative has been consulted.  It appears that resection of the duodenal mass is not possible.  The available options are palliative surgical bypass (gastrojejunostomy), versus comfort measures.  -Continue TPN  -Continue Cipro Flagyl for now, pending palliative discussions -Consult General Surgery and Palliative Care, appreciate cares  -GI have signed off  -Continue PPI    Atrial fibrillation with RVR Heart rates 120s and 130s today.  On metoprolol  and diltiazem by mouth at home. - Continue metoprolol, increase to 5 mg every 6 - We have been prevented from adding IV diltiazem due to hypotension    COPD Chronic hypoxic respiratory failure On Home O2. -Continue ICS/LAMA - Continue Incruse - As needed bronchodilators  SSS with pacer  Chronic diastolic CHF Appears euvolemic  CKD stage IIIa AKI ruled out           Disposition: Status is: Inpatient  Remains inpatient appropriate because:IV treatments appropriate due to intensity of illness or inability to take PO  Dispo: The patient is from: Home              Anticipated d/c is to: SNF              Patient currently is not medically stable to d/c.   Difficult to place patient No       Level of care: Telemetry Medical       MDM: The below labs and imaging reports were reviewed and summarized above.  Medication management as above.     DVT prophylaxis: Place and maintain sequential compression device Start: 03/02/21 1804  Code Status: DNR Family Communication: Daughter at the bedside            Subjective: Nausea.  She has some back and abdominal pain, these are mild to moderate.  No confusion, chest pain, dyspnea.  Objective: Vitals:   03/07/21 0745 03/07/21 0907 03/07/21 1135 03/07/21 1500  BP: (!) 94/57  103/79 107/81  Pulse: (!) 108  (!) 114 (!) 106  Resp: 17  20   Temp: 97.6 F (36.4 C)  (!) 97.4  F (36.3 C) 97.6 F (36.4 C)  TempSrc: Oral  Oral Oral  SpO2: 100% 100% 99%   Weight:      Height:        Intake/Output Summary (Last 24 hours) at 03/07/2021 1906 Last data filed at 03/07/2021 1718 Gross per 24 hour  Intake 2700.53 ml  Output 1525 ml  Net 1175.53 ml   Filed Weights   03/05/21 0347 03/06/21 0610 03/07/21 0300  Weight: 72.1 kg 69.8 kg 70.6 kg    Examination: General appearance: Elderly adult female, alert and in mild distress.  Appears tired and uncomfortable HEENT: Anicteric, conjunctiva pink, lids and  lashes normal. No nasal deformity, discharge, epistaxis.  Lips moist, NG tube in place, oropharynx tacky dry, no oral lesions.   Skin: Warm and dry.  no jaundice.  No suspicious rashes or lesions. Cardiac: Tachycardic, irregular, nl S1-S2, no murmurs appreciated.  Capillary refill is brisk.  JVP normal.  No LE edema.  Radial pulses 2+ and symmetric. Respiratory: Normal respiratory rate and rhythm.  CTAB without rales or wheezes. Abdomen: Abdomen soft.  There is tenderness to palpation, no guarding or rebound or rigidity. No ascites, distension, hepatosplenomegaly.   MSK: No deformities or effusions. Neuro: Awake and alert.  EOMI, moves all extremities with generalized weakness. Speech fluent.    Psych: Sensorium intact and responding to questions, attention normal. Affect normal.  Judgment and insight appear normal.    Data Reviewed: I have personally reviewed following labs and imaging studies:  CBC: Recent Labs  Lab 03/01/21 0500 03/02/21 1748 03/04/21 0500 03/07/21 0313  WBC 8.7 9.9 10.6* 7.4  NEUTROABS  --   --  8.0*  --   HGB 14.4 13.8 12.9 11.8*  HCT 43.8 41.8 40.0 36.9  MCV 96.5 95.4 97.8 99.2  PLT 162 161 142* 678*   Basic Metabolic Panel: Recent Labs  Lab 03/03/21 0057 03/03/21 1004 03/04/21 0500 03/05/21 0910 03/06/21 0321 03/07/21 0313  NA 144  --  143 141 142 141  K 3.6  --  3.5 3.9 4.1 4.7  CL 105  --  103 108 109 112*  CO2 25  --  32 26 26 24   GLUCOSE 121*  --  144* 115* 115* 104*  BUN 34*  --  29* 30* 40* 48*  CREATININE 1.35*  --  1.27* 1.14* 1.25* 1.25*  CALCIUM 9.1  --  9.2 8.6* 8.6* 8.9  MG  --  2.0 2.0 1.9 1.8 1.9  PHOS  --  3.1 2.2* 3.0 3.8 3.7   GFR: Estimated Creatinine Clearance: 27.6 mL/min (A) (by C-G formula based on SCr of 1.25 mg/dL (H)). Liver Function Tests: Recent Labs  Lab 03/03/21 0057 03/04/21 0500 03/05/21 0910 03/06/21 0321 03/07/21 0313  AST 26 20 17 16 18   ALT 10 10 11 10 9   ALKPHOS 58 58 47 38 50  BILITOT 1.8* 1.8*  0.8 0.9 0.9  PROT 5.4* 5.7* 5.1* 4.7* 5.2*  ALBUMIN 2.8* 2.6* 2.3* 2.1* 2.3*   No results for input(s): LIPASE, AMYLASE in the last 168 hours. No results for input(s): AMMONIA in the last 168 hours. Coagulation Profile: Recent Labs  Lab 03/01/21 0500 03/02/21 0514 03/02/21 1748 03/07/21 0313  INR 1.5* 1.4* 1.6* 1.2   Cardiac Enzymes: No results for input(s): CKTOTAL, CKMB, CKMBINDEX, TROPONINI in the last 168 hours. BNP (last 3 results) No results for input(s): PROBNP in the last 8760 hours. HbA1C: No results for input(s): HGBA1C in the last 72 hours. CBG: Recent  Labs  Lab 03/05/21 0627 03/05/21 1328 03/05/21 1843 03/05/21 2350 03/06/21 0605  GLUCAP 123* 105* 101* 117* 120*   Lipid Profile: No results for input(s): CHOL, HDL, LDLCALC, TRIG, CHOLHDL, LDLDIRECT in the last 72 hours.  Thyroid Function Tests: No results for input(s): TSH, T4TOTAL, FREET4, T3FREE, THYROIDAB in the last 72 hours. Anemia Panel: No results for input(s): VITAMINB12, FOLATE, FERRITIN, TIBC, IRON, RETICCTPCT in the last 72 hours. Urine analysis:    Component Value Date/Time   COLORURINE YELLOW (A) 02/26/2021 1651   APPEARANCEUR HAZY (A) 02/26/2021 1651   LABSPEC 1.040 (H) 02/26/2021 1651   PHURINE 5.0 02/26/2021 1651   GLUCOSEU NEGATIVE 02/26/2021 1651   HGBUR NEGATIVE 02/26/2021 1651   BILIRUBINUR NEGATIVE 02/26/2021 1651   KETONESUR NEGATIVE 02/26/2021 1651   PROTEINUR NEGATIVE 02/26/2021 1651   NITRITE NEGATIVE 02/26/2021 1651   LEUKOCYTESUR TRACE (A) 02/26/2021 1651   Sepsis Labs: @LABRCNTIP (procalcitonin:4,lacticacidven:4)  ) Recent Results (from the past 240 hour(s))  SARS CORONAVIRUS 2 (TAT 6-24 HRS) Nasopharyngeal Nasopharyngeal Swab     Status: None   Collection Time: 02/26/21  2:32 PM   Specimen: Nasopharyngeal Swab  Result Value Ref Range Status   SARS Coronavirus 2 NEGATIVE NEGATIVE Final    Comment: (NOTE) SARS-CoV-2 target nucleic acids are NOT DETECTED.  The  SARS-CoV-2 RNA is generally detectable in upper and lower respiratory specimens during the acute phase of infection. Negative results do not preclude SARS-CoV-2 infection, do not rule out co-infections with other pathogens, and should not be used as the sole basis for treatment or other patient management decisions. Negative results must be combined with clinical observations, patient history, and epidemiological information. The expected result is Negative.  Fact Sheet for Patients: SugarRoll.be  Fact Sheet for Healthcare Providers: https://www.woods-mathews.com/  This test is not yet approved or cleared by the [place] FDA and  has been authorized for detection and/or diagnosis of SARS-CoV-2 by FDA under an Emergency Use Authorization (EUA). This EUA will remain  in effect (meaning this test can be used) for the duration of the COVID-19 declaration under Se ction 564(b)(1) of the Act, 21 U.S.C. section 360bbb-3(b)(1), unless the authorization is terminated or revoked sooner.  Performed at [facility], [place] [address], [place], [place] [address]          Radiology Studies: DG Abd Portable 1V  Result Date: 03/07/2021 CLINICAL DATA:  NG tube placement EXAM: PORTABLE ABDOMEN - 1 VIEW COMPARISON:  Radiograph 03/03/2021, 03/01/2021 FINDINGS: Transesophageal tube tip terminates in the left upper quadrant within an air-filled stomach. Side port is positioned at the GE junction. Consider advancing 3-5 cm for optimal positioning. Left chest wall pacer pack with leads at the right atrium and cardiac apex. Telemetry leads overlie the chest. Some persistent streaky opacities in the bilateral lung bases. Stable cardiomediastinal contours with a calcified, tortuous aorta. Air distended appearance of the stomach. IMPRESSION: Transesophageal tube side port at the GE junction. Consider advancing 3-5 cm for optimal positioning. Nonspecific gastric  air distension. Atelectatic changes and/or scarring in the lung bases. Aortic Atherosclerosis (ICD10-I70.0). Electronically Signed   By: Lovena Le M.D.   On: 03/07/2021 00:13        Scheduled Meds:  Chlorhexidine Gluconate Cloth  6 each Topical Daily   metoprolol tartrate  5 mg Intravenous Q6H   mometasone-formoterol  2 puff Inhalation BID   pantoprazole (PROTONIX) IV  40 mg Intravenous Q12H   sodium chloride flush  10-40 mL Intracatheter Q12H   umeclidinium  bromide  1 puff Inhalation Daily   Continuous Infusions:  sodium chloride 10 mL/hr at 03/07/21 1718   ciprofloxacin 400 mg (03/07/21 1750)   metronidazole Stopped (03/07/21 1652)   TPN ADULT (ION) 80 mL/hr at 03/07/21 1718     LOS: 5 days    Time spent: 25  minutes    Edwin Dada, MD [facility] 03/07/2021, 7:06 PM     Please page though [place] or Epic secure chat:  For [employer], Adult nurse

## 2021-03-07 NOTE — Progress Notes (Signed)
Physical Therapy Treatment Patient Details Name: [name] [name] MRN: [id number] DOB: 1926/10/27 Today's Date: 03/07/2021    History of Present Illness Pt is a 87 y.o. female admitted 6/7 to [facility] with N/V abdominal pain and partial gastric outlet obstruction with duodenal mass. NG tube placed and pt underwent EGD with noted deformity of duodenum w/ biopsy though no obvious mass. 6/11 pt transferred to [facility], 6/13 endoscopy. PMhx: HTN, COPD, CKD, SSS s/p PPM, a fib, CAD, CHF, HLD, GERD, PVD, breast cancer.    PT Comments    Pt received in chair, agreeable to therapy session for transfer training but with limited activity tolerance due to nausea and deferred gait trial. Reviewed BLE seated exercises and supine exercise handout given but session time limited due to arrival of family/PA for conference so will plan to review HEP/progress gait next session. Pt continues to benefit from PT services to progress toward functional mobility goals.    Follow Up Recommendations  SNF;Supervision for mobility/OOB     Equipment Recommendations  None recommended by PT    Recommendations for Other Services       Precautions / Restrictions Precautions Precautions: Fall;Other (comment) Precaution Comments: NG tube to suction Restrictions Weight Bearing Restrictions: No    Mobility  Bed Mobility Overal bed mobility: Needs Assistance Bed Mobility: Sit to Supine       Sit to supine: Min assist;+2 for safety/equipment;HOB elevated   General bed mobility comments: cues for reverse log roll for comfort and HOB already elevated >30 degrees due to NGT, pt needing BLE assist; also performed posterior supine scoot assisting with BLE, staff assist with transfer pads due to [facility] elevated.    Transfers Overall transfer level: Needs assistance Equipment used: 1 person hand held assist Transfers: Stand Pivot Transfers Sit to Stand: Min assist;+2 safety/equipment Stand pivot  transfers: Min assist;+2 safety/equipment       General transfer comment: cues for safety/hand placement, small low steps at bedside and second staff member present to assist with NGT to suction line during transfer  Ambulation/Gait Ambulation/Gait assistance: Min assist Gait Distance (Feet): 4 Feet Assistive device: 1 person hand held assist Gait Pattern/deviations: Step-through pattern;Decreased stride length;Trunk flexed     General Gait Details: limited distance due to fatigue, from chair>bed and sidestepping along EOB toward HOB only; c/o nausea   Stairs             Wheelchair Mobility    Modified Rankin (Stroke Patients Only)       Balance Overall balance assessment: Needs assistance Sitting-balance support: No upper extremity supported;Feet supported Sitting balance-Leahy Scale: Fair     Standing balance support: Bilateral upper extremity supported;During functional activity;Single extremity supported Standing balance-Leahy Scale: Poor Standing balance comment: reliant on at least one UE support standing EOB                            Cognition Arousal/Alertness: Awake/alert Behavior During Therapy: WFL for tasks assessed/performed Overall Cognitive Status: Impaired/Different from baseline Area of Impairment: Safety/judgement                         Safety/Judgement: Decreased awareness of deficits;Decreased awareness of safety     General Comments: some decreased awareness of deficits, but pleasant and participatory. follows all directions, limited activity tolerance due to nausea and wanting to speak with palliative PA/family in room.      Exercises General Exercises -  Lower Extremity Long Arc Quad: AROM;Both;Seated;10 reps Hip Flexion/Marching: AROM;Both;Seated;10 reps Other Exercises Other Exercises: "General Strengthening - Supine" handout given to family member after session to reinforce HEP, pt in conference with care  team/family so plan to review in future sessions Other Exercises: per family she was told not to use IS while NGT donned so defer    General Comments General comments (skin integrity, edema, etc.): BP 103/79 (87) HR 112 bpm with exertion; reporting nausea, PA in room and aware      Pertinent Vitals/Pain Pain Assessment: No/denies pain    Home Living                      Prior Function            PT Goals (current goals can now be found in the care plan section) Acute Rehab PT Goals Patient Stated Goal: drink something, go home PT Goal Formulation: With patient Time For Goal Achievement: 03/19/21 Progress towards PT goals: Progressing toward goals    Frequency    Min 3X/week      PT Plan Current plan remains appropriate    Co-evaluation              AM-PAC PT "6 Clicks" Mobility   Outcome Measure  Help needed turning from your back to your side while in a flat bed without using bedrails?: A Little Help needed moving from lying on your back to sitting on the side of a flat bed without using bedrails?: A Little Help needed moving to and from a bed to a chair (including a wheelchair)?: A Little Help needed standing up from a chair using your arms (e.g., wheelchair or bedside chair)?: A Little Help needed to walk in hospital room?: A Lot Help needed climbing 3-5 steps with a railing? : A Lot 6 Click Score: 16    End of Session Equipment Utilized During Treatment: Gait belt Activity Tolerance: Patient tolerated treatment well Patient left: in bed;with call bell/phone within reach;with bed alarm set;with family/visitor present (PA present) Nurse Communication: Mobility status;Other (comment) (c/o nausea) PT Visit Diagnosis: Other abnormalities of gait and mobility (R26.89);Difficulty in walking, not elsewhere classified (R26.2);Muscle weakness (generalized) (M62.81)     Time: 5498-2641 PT Time Calculation (min) (ACUTE ONLY): 9 min  Charges:   $Therapeutic Activity: 8-22 mins                     Fordyce Lepak P., PTA Acute Rehabilitation Services Pager: [phone] Office: National 03/07/2021, 5:49 PM

## 2021-03-07 NOTE — Progress Notes (Signed)
PHARMACY - TOTAL PARENTERAL NUTRITION CONSULT NOTE   Indication:  partial gastric outlet obstruction  and duodenal mass  Patient Measurements: Height: 5\' 3"  (160 cm) Weight: 70.6 kg (155 lb 11.2 oz) IBW/kg (Calculated) : 52.4   Body mass index is 27.58 kg/m. Usual Weight: unknown  Assessment:  56 yof experiencing N/V and abdominal pain with distention progressing over two week period who presented to [facility] ED on 6/7. 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  NGT placed on LIWS. Surgery and GI consulted. EGD with findings of thickened folds mucosa in the antrum, extrinsic deformity second portion of duodenum s/p biopsy, no obvious mass. MRCP recommended but unable to be done due to PPM. Transferred to [facility] for continued work-up.  NG tube was clamped 6/14 and was trialed on CLD 6/15. NG tube was removed 6/15, patient developed nausea and vomiting afterwards.  NG tube was replaced and is now back to NPO diet. No reports of nausea since NG tube replaced.   Glucose / Insulin: no hx DM, CBGs controlled, SSI d/c'd 6/15 Electrolytes: all wnl, K trending up (reduced in TPN yesterday) Renal: CKD, SCr 1.25 (Baseline 1.3), BUN 48 (trending up) Hepatic: LFTs WNL, TG 100, Tbili normalized, albumin 2.1, prealbumin 9.1 Intake / Output; MIVF: UOP not documented, emesis/NG output 950 ml, no MIVF GI Imaging: 6/7 CT A/P: dilation of the proximal duodenum which may represent duodenal mass  6/10 Abd XR: no bowel obstruction or free air, mod air in stomach GI Surgeries / Procedures:  6/10 EGD: biopsied thickened folds mucosa and duodenal deformity however no obvious mass  6/13 EGD: no lesions in esophagus; found non-bleeding gastric ulcers, duodenal stenosis w/ inflamed mucosa, and likely small bowel diverticulitis  Central access: triple lumen PICC placed  6/12 TPN start date: 03/03/21  Nutritional Goals (per RD recommendation 6/13): kCal: 1750-1950, Protein: 90-105 g, Fluid: >1.7L Goal TPN rate is 80 mL/hr (provides 95 g of protein and ~1900 kcals per day)  Current Nutrition:  NPO and TPN  Plan:  Continue TPN at goal of 14mL/hr at 1800  Electrolytes in TPN: Na 68mEq/L, K 29mEq/L, Ca 1mEq/L, Mg 33mEq/L, and Phos 67mmol/L. Cl:Ac 1:2 Add standard MVI and trace elements to TPN Monitor TPN labs on Mon/Thurs F/u PO intake and ability to wean TPN F/u BMET on Saturday  Thank you for involving pharmacy in this patient's care.  Dimple Nanas, PharmD 03/07/2021 7:45 AM  **Pharmacist phone directory can be found on [email or web address] listed under [employer]**

## 2021-03-08 MED ORDER — BISACODYL 10 MG RE SUPP
10.0000 mg | Freq: Every day | RECTAL | Status: DC
  Filled 2021-03-08 (×2): qty 1

## 2021-03-08 MED ORDER — TRAVASOL 10 % IV SOLN
INTRAVENOUS | Status: AC
  Filled 2021-03-08: qty 960

## 2021-03-08 NOTE — Progress Notes (Signed)
This chaplain responded to PMT consult for spiritual care as the Pt. and family continues to discuss surgery vs. hospice care.  The Pt. three daughters meet the chaplain at the door and share the Pt. has decided on hospice care.  The chaplain's presence intersects with the Pt. humor in the room.  The chaplain hears the Pt. daughters reassure the Pt. with their presence as the Pt. questions what will come next.    The chaplain listens as the Pt. tells the stories of her multi-generational family, church, and work. Each relationship and tradition has a piece of the Pt. humor and gift of thinking of others first.   The chaplain understands the Pt. has a rich [employer] tradition, but has been away from her faith community because of [place].    The Pt. is open to the chaplain's prayer and F/U spiritual care.

## 2021-03-08 NOTE — Progress Notes (Signed)
[facility] Progress Note  4 Days Post-Op  Subjective: CC:   NAEO. NG with > 2L/24h dark and bilious. Patient sitting in bed daughter at bedside. Denies pain currently. Getting up and mobilizing in the room, washed up at the sink yesterday with OT.  Objective: Vital signs in last 24 hours: Temp:  [97.4 F (36.3 C)-98.9 F (37.2 C)] 97.5 F (36.4 C) (06/17 0816) Pulse Rate:  [100-133] 100 (06/17 0816) Resp:  [16-20] 20 (06/17 0816) BP: (88-130)/(22-96) 106/44 (06/17 0816) SpO2:  [96 %-100 %] 100 % (06/17 0900) Weight:  [68.5 kg] 68.5 kg (06/17 0353) Last BM Date: 02/26/21  Intake/Output from previous day: 06/16 0701 - 06/17 0700 In: 2128.6 [I.V.:1628.6; IV Piggyback:500] Out: 3025 [Urine:825; Emesis/NG output:2200] Intake/Output this shift: No intake/output data recorded.  PE: Gen:  Alert, NAD, pleasant elderly female  Card:  Regular rate and rhythm, pedal pulses 2+ BL Pulm:  Normal effort, clear to auscultation bilaterally Abd: Soft, non-tender, mild distention, NG in place with bilious output. Skin: warm and dry, no rashes  Psych: A&Ox3   Lab Results:  Recent Labs    03/07/21 0313  WBC 7.4  HGB 11.8*  HCT 36.9  PLT 121*   BMET Recent Labs    03/06/21 0321 03/07/21 0313  NA 142 141  K 4.1 4.7  CL 109 112*  CO2 26 24  GLUCOSE 115* 104*  BUN 40* 48*  CREATININE 1.25* 1.25*  CALCIUM 8.6* 8.9   PT/INR Recent Labs    03/07/21 0313  LABPROT 15.4*  INR 1.2    CMP     Component Value Date/Time   NA 141 03/07/2021 0313   K 4.7 03/07/2021 0313   CL 112 (H) 03/07/2021 0313   CO2 24 03/07/2021 0313   GLUCOSE 104 (H) 03/07/2021 0313   BUN 48 (H) 03/07/2021 0313   CREATININE 1.25 (H) 03/07/2021 0313   CALCIUM 8.9 03/07/2021 0313   PROT 5.2 (L) 03/07/2021 0313   ALBUMIN 2.3 (L) 03/07/2021 0313   AST 18 03/07/2021 0313   ALT 9 03/07/2021 0313   ALKPHOS 50 03/07/2021 0313   BILITOT 0.9 03/07/2021 0313   GFRNONAA 41 (L) 03/07/2021 0313    GFRAA 60 (L) 08/10/2018 1049   Lipase     Component Value Date/Time   LIPASE 44 02/26/2021 1305       Studies/Results: DG Abd Portable 1V  Result Date: 03/07/2021 CLINICAL DATA:  NG tube placement EXAM: PORTABLE ABDOMEN - 1 VIEW COMPARISON:  Radiograph 03/03/2021, 03/01/2021 FINDINGS: Transesophageal tube tip terminates in the left upper quadrant within an air-filled stomach. Side port is positioned at the GE junction. Consider advancing 3-5 cm for optimal positioning. Left chest wall pacer pack with leads at the right atrium and cardiac apex. Telemetry leads overlie the chest. Some persistent streaky opacities in the bilateral lung bases. Stable cardiomediastinal contours with a calcified, tortuous aorta. Air distended appearance of the stomach. IMPRESSION: Transesophageal tube side port at the GE junction. Consider advancing 3-5 cm for optimal positioning. Nonspecific gastric air distension. Atelectatic changes and/or scarring in the lung bases. Aortic Atherosclerosis (ICD10-I70.0). Electronically Signed   By: Lovena Le M.D.   On: 03/07/2021 00:13    Anti-infectives: Anti-infectives (From admission, onward)    Start     Dose/Rate Route Frequency Ordered Stop   03/04/21 1700  ciprofloxacin (CIPRO) IVPB 400 mg        400 mg 200 mL/hr over 60 Minutes Intravenous Every 24 hours 03/04/21 1600  03/04/21 1600  metroNIDAZOLE (FLAGYL) IVPB 500 mg        500 mg 100 mL/hr over 60 Minutes Intravenous Every 12 hours 03/04/21 1556     03/04/21 1600  ciprofloxacin (CIPRO) IVPB 400 mg  Status:  Discontinued        400 mg 200 mL/hr over 60 Minutes Intravenous Every 12 hours 03/04/21 1556 03/04/21 1600       Assessment/Plan Partial gastric outlet obstruction - unknown etiology, possibly duodenal diverticulitis? Biliary obstruction Hyperbilirubinemia - resolved, now 0.9 from 1.8 - CT abdomen/pelvis was notable for a duodenal mass at the junction of the second and third part of the  duodenum causing dilation proximally and possibly obstructing the distal common bile duct - unable to obtain MRCP due to pacemaker. GI consulted 6/12. Repeat EGD and EUS done 6/13, Bx taken from D2/D3 and path benign.   GI started abx for possible duodenal diverticulitis.  - Pre-albumin is 9.1. continue PICC/TPN, repeat pre-albumin on Monday. - palliative care consulted  and is discussing goals of care/hospice options with the patient, appreciate their time and assistance. - We will follow, surgical intervention would likely be an open palliative gastrojejunostomy. I discussed this operation in detail today with the patient and her daughter. I discussed the risk of surgery including the calculated NSQIP surgical risks of >30% risk of perioperative complication, 93% risk of death, >64% risk of need for SNF after surgery. The patient and her family are considering their options. [facility] will follow.   ID - none VTE - hold coumadin, ok for IV heparin or chemical dvt prophylaxis per primary FEN - IVF, NPO, NG to LIWS, TPN per pharmacy  Foley - none   Atrial fibrillation with RVR COPD Chronic diastolic CHF CAD HTN HLD SSS s/p PPM GERD CKD stage IIIa Code status DNR   LOS: 6 days    Obie Dredge, [facility] Please see Amion for pager number during day hours 7:00am-4:30pm

## 2021-03-08 NOTE — Progress Notes (Signed)
Total gastric content 2,200 ml in 24 hours. No nausea and no vomiting. Denied abdominal pain. Mostly feels generalized pain and weakness form limit mobilities. Pt was anxious, she requested Xanax and Toradol at bedtime. She was able to sleep after bedtime med given. Hemodynamics stable. Afib on monitor, HR 100-128, BP stable. No respiratory distress.   [name] [name], Pt's daughter visited yesterday. She worried about the plan of care for her mother.  [name] [name] cannot make decision for Pt after discussed with palliative team about comfort care or surgical bypass. Emotional support given. We will continue to monitor.  Kennyth Lose, RN

## 2021-03-08 NOTE — Progress Notes (Signed)
Manufacturing engineer [facility]) Hospital Liaison note  Received request from [place] for family interest in [facility]. Chart and pt information under review by [facility] physician.  Hospice eligibility pending at this time.  Spoke by phone with daughters [name] and Manuela Schwartz, jointly, to initiate education on inpatient hospice and to answer any questions.  Both verbalized understanding of information provided and the process of determining eligibility and of transferring to [facility].  Contact information given.  [facility] is unable to offer a room today. Hospital Liaison will follow up tomorrow or sooner if a room becomes available. Please do not hesitate to call with questions.    Thank you for the opportunity to participate in this patient's care.  Domenic Moras, BSN, RN [facility] Liaison (listed on [place] under Hospice[facility])    [phone] (24h on call)

## 2021-03-08 NOTE — Progress Notes (Addendum)
PHARMACY - TOTAL PARENTERAL NUTRITION CONSULT NOTE   Indication:  partial gastric outlet obstruction  and duodenal mass  Patient Measurements: Height: 5\' 3"  (160 cm) Weight: 68.5 kg (151 lb 0.2 oz) IBW/kg (Calculated) : 52.4   Body mass index is 26.75 kg/m. Usual Weight: unknown  Assessment:  66 yof experiencing N/V and abdominal pain with distention progressing over two week period who presented to [facility] ED on 6/7. 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  NGT placed on LIWS. Surgery and GI consulted. EGD with findings of thickened folds mucosa in the antrum, extrinsic deformity second portion of duodenum s/p biopsy, no obvious mass. MRCP recommended but unable to be done due to PPM. Transferred to [facility] for continued work-up.  Discussed with Dorthy Cooler, PA (palliative care) and Dr. Erlinda Hong - [place] discussion still ongoing. Continue TPN for now.   Glucose / Insulin: no hx DM, CBGs controlled, SSI d/c'd 6/15 Electrolytes: 6/16: K trending up (reduced in TPN 6/16) Renal: CKD, SCr 1.25 (Baseline 1.3), BUN 48 (trending up) Hepatic: LFTs WNL, TG 100, Tbili normalized, albumin 2.1, prealbumin 9.1 Intake / Output; MIVF: UOP not documented, emesis/NG output 950 ml, no MIVF GI Imaging: 6/7 CT A/P: dilation of the proximal duodenum which may represent duodenal mass  6/10 Abd XR: no bowel obstruction or free air, mod air in stomach GI Surgeries / Procedures:  6/10 EGD: biopsied thickened folds mucosa and duodenal deformity however no obvious mass  6/13 EGD: no lesions in esophagus; found non-bleeding gastric ulcers, duodenal stenosis w/ inflamed mucosa, and likely small bowel diverticulitis  Central access: triple lumen PICC placed 6/12 TPN start date: 03/03/21  Nutritional Goals (per RD recommendation 6/13): kCal: 1750-1950, Protein: 90-105 g,  Fluid: >1.7L Goal TPN rate is 80 mL/hr (provides 95 g of protein and ~1900 kcals per day)  Current Nutrition:  NPO and TPN  Plan:  No changes to TPN today Continue TPN at goal of 21mL/hr at 1800  Electrolytes in TPN: Na 29mEq/L, K 23mEq/L, Ca 70mEq/L, Mg 49mEq/L, and Phos 79mmol/L. Cl:Ac 1:2 Add standard MVI and trace elements to TPN Monitor TPN labs on Mon/Thurs F/u PO intake and ability to wean TPN F/u GOC - consider cycle if will be on long term  F/u BMET on Saturday  Thank you for involving pharmacy in this patient's care.  Dimple Nanas, PharmD 03/08/2021 7:38 AM  **Pharmacist phone directory can be found on [email or web address] listed under [place]**

## 2021-03-08 NOTE — TOC Progression Note (Signed)
Transition of Care [facility]) - Progression Note    Patient Details  Name: [name] [name] MRN: [id number] Date of Birth: 17-Oct-1926  Transition of Care [facility]) CM/SW [place], [place] Phone Number: 03/08/2021, 3:02 PM  Clinical Narrative:    CSW was notified that family was pursuing hospice care at this time. CSW spoke with daughter [name], who is agreeable to a [employer] referral.SW will continue to follow for DC needs.   Expected Discharge Plan: [place] Barriers to Discharge: Continued Medical Work up  Expected Discharge Plan and Services Expected Discharge Plan: [place] Choice: [place] arrangements for the past 2 months: Single Family Home                                       Social Determinants of Health (SDOH) Interventions    Readmission Risk Interventions No flowsheet data found.

## 2021-03-08 NOTE — Progress Notes (Signed)
Daily Progress Note   Patient Name: [name] [name]       Date: 03/08/2021 DOB: [birth year]  Age: 87 y.o. MRN#: [id number] Attending Physician: Florencia Reasons, MD Primary Care Physician: Tracie Harrier, MD Admit Date: 03/02/2021  Reason for Consultation/Follow-up: Establishing goals of care and Psychosocial/spiritual support  Subjective: Medical records reviewed. Assessed patient at the bedside and met for another family meeting with her daughters [name] [name], and [name] [name].  [name] [name] remains conflicted about whether to proceed with surgery for hospice.  Patient's daughters supported her greatly as we reviewed the details of each decision.  Her main concerns are whether these options are covered by insurance and if surgery will even be helpful with the risk of complications.  She reflects on various friends who have done both poorly and well after surgery at her age.  She ultimately wants to "get this decision over with" so she may some get rest and focus on being happy.  Shared my recommendation that a hospice path is more aligned with this preference, while a surgical approach will require months of recovery and rehab.  She is tearful and uncertain of accepting an option with a prognosis of weeks, but also reflects on the changes she has noticed her body going through over the past month. She feels that she has had a good life and "if I'm going to die anyway I'd rather not do the surgery." Emotional and spiritual support provided through prayer. [name] [name] does not wish to accept the risk of surgical complications.  We then discussed the pros and cons of hospice at home or residential hospice in great detail.  [name] [name] feels no attachment to her home and finds comfort in knowing that she could be cared for 24/7 at  residential hospice.  Her daughters encouraged her and reassured her that they could make arrangements for her to go home if she desired.  The decision was ultimately made for residential hospice.  Discussed the option of transitioning to comfort care in the hospital while awaiting a bed at [place]. Patient would no longer receive aggressive medical interventions such as continuous vital signs, lab work, radiology testing, or medications not focused on comfort. All care would focus on how the patient is looking and feeling. This would include management of any symptoms that may cause discomfort, pain, shortness  of breath, cough, nausea, agitation, anxiety, and/or secretions etc. Symptoms would be managed with medications and other non-pharmacological interventions such as spiritual support if requested, repositioning, music therapy, or therapeutic listening.  [name] [name] states "they are ready had to put this in 3 times, they might as well keep doing it. " Decision was made to continue current interventions while awaiting [place] bed.    Questions and concerns addressed. PMT will continue to support holistically.  Length of Stay: 6  Current Medications: Scheduled Meds:   Chlorhexidine Gluconate Cloth  6 each Topical Daily   metoprolol tartrate  5 mg Intravenous Q6H   mometasone-formoterol  2 puff Inhalation BID   pantoprazole (PROTONIX) IV  40 mg Intravenous Q12H   sodium chloride flush  10-40 mL Intracatheter Q12H   umeclidinium bromide  1 puff Inhalation Daily    Continuous Infusions:  sodium chloride 10 mL/hr at 03/07/21 1718   ciprofloxacin 400 mg (03/07/21 1750)   metronidazole 500 mg (03/08/21 0349)   TPN ADULT (ION) 80 mL/hr at 03/07/21 1718   TPN ADULT (ION)      PRN Meds: acetaminophen, ALPRAZolam, ipratropium-albuterol, ketorolac, morphine injection, ondansetron **OR** ondansetron (ZOFRAN) IV, sodium chloride flush  Physical Exam Vitals and nursing note reviewed.   Constitutional:      Appearance: She is ill-appearing.     Comments: NGT secured in place.  Cardiovascular:     Rate and Rhythm: Tachycardia present.  Pulmonary:     Effort: Pulmonary effort is normal.  Skin:    General: Skin is warm and dry.  Neurological:     Mental Status: She is oriented to person, place, and time.             Vital Signs: BP (!) 106/44 (BP Location: Right Leg)   Pulse 100   Temp (!) 97.5 F (36.4 C) (Oral)   Resp 20   Ht '5\' 3"'  (1.6 m)   Wt 68.5 kg   SpO2 100%   BMI 26.75 kg/m  SpO2: SpO2: 100 % O2 Device: O2 Device: Room Air ([place] not in nose.) O2 Flow Rate: O2 Flow Rate (L/min): 2 L/min  Intake/output summary:  Intake/Output Summary (Last 24 hours) at 03/08/2021 1002 Last data filed at 03/08/2021 0357 Gross per 24 hour  Intake 2128.59 ml  Output 2400 ml  Net -271.41 ml    LBM: Last BM Date: 02/26/21 Baseline Weight: Weight: 77.9 kg Most recent weight: Weight: 68.5 kg       Palliative Assessment/Data: 30%     Patient Active Problem List   Diagnosis Date Noted   Nasogastric tube present    Goals of care, counseling/discussion    Partial gastric outlet obstruction 03/02/2021   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   Abdominal pain 02/26/2021   Nausea vomiting and diarrhea 02/26/2021   COPD (chronic obstructive pulmonary disease) (HCC)    DVT (deep venous thrombosis) (HCC)    CAD (coronary artery disease)    Chronic diastolic CHF (congestive heart failure) (HCC)    Elevated lactic acid level    CKD (chronic kidney disease), stage IIIa    Esophageal stricture 07/12/2018   Osteoporosis 07/12/2018   Urge urinary incontinence 07/12/2018   Hypersensitivity pneumonitis due to unspecified organic dust ([place]) 05/18/2018   VHD (valvular heart disease) 05/18/2018   Chest discomfort 03/16/2017   Atrial fibrillation with RVR ([place]) 03/23/2015   SOB (shortness of breath) 03/06/2014   Essential (primary) hypertension 12/29/2013   Hyperlipidemia, unspecified  12/29/2013   Pacemaker 10/26/2007  Palliative Care Assessment & Plan   Patient Profile: 87 y.o. female  with past medical history of essential hypertension, COPD, CKD stage IIIa, SSS s/p PPM, paroxysmal atrial fibrillation on Coumadin, CAD, chronic diastolic congestive heart failure, hyperlipidemia, GERD, peripheral vascular disease, distant history of breast cancer admitted on 03/02/2021 from [facility] with partial gastric outlet obstruction and duodenal mass.   NG tube was placed to low wall intermittent suction.  Gastroenterology and general surgery were consulted and patient underwent EGD and biopsy of duodenum. Palliative care has been consulted to provide support and assist with goals of care discussions as the patient and family are faced with potential treatment options and difficult decision making.  Assessment: Partial gastric outlet obstruction, not responsive to medical management Duodenal mass Afib with RVR, improving Deconditioning Goals of care discussion  Recommendations/Plan: Continue current interventions while awaiting [place] Bed Discussed patient and family's decision for residential hospice with Dr. Erlinda Hong, [place], LCSW, [facility] Consult chaplain for spiritual support Psychosocial and emotional support provided PMT will continue to follow for additional needs  Goals of Care and Additional Recommendations: Limitations on Scope of Treatment: No Surgical Procedures  Prognosis:  < 2 weeks  Discharge Planning: Hospice facility  Total time: 95 minutes Greater than 50% of this time was spent in counseling and coordinating care related to the above assessment and plan.  Dorthy Cooler, PA-C Palliative Medicine Team Team phone # [phone]  Thank you for allowing the Palliative Medicine Team to assist in the care of this patient. Please utilize secure chat with additional questions, if there is no response within 30 minutes please call the above phone  number.  Palliative Medicine Team providers are available by phone from 7am to 7pm daily and can be reached through the team cell phone.  Should this patient require assistance outside of these hours, please call the patient's attending physician.

## 2021-03-08 NOTE — Progress Notes (Signed)
[facility] PROGRESS NOTE    NEYTIRI ASCHE  FIE:[id number] DOB: April 29, 1927 DOA: 03/02/2021 PCP: Tracie Harrier, MD      Brief Narrative:  Mrs. Thaxton is a 87 y.o. F with HTN, COPD on home O2, SSS s/p PPM, pAF on warfarin, CAD, dCHF, CKD IIIa, PVD and breast cancer remote who presented with partial gastric outlet obstruction and duodenal mass.  Patient first developed about 2 weeks of abdominal distention, pain, and nausea vomiting.  In the ER at [facility], CT showed duodenal mass causing proximal dilation and apparently obstructing the distal common bile duct and gastric outlet.  Gastroenterology and general surgery were consulted, EGD showed thickened folds of mucosa in the antrum, and an extrinsic deformity in the second portion of the duodenum which was biopsied.  MRCP was recommended so the patient was transferred to [facility].    However not able to proceed to his MRCP due to noncompatible pacemaker. General surgery/GI/palliative care following for goals of care, currently on NG suction, TPN, IV medication  On 6/17, patient and family decided to proceed with residential hospice placement when bed is available     Assessment & Plan:  Partial gastric outlet obstruction and biliary obstruction Due to ampullary versus duodenal mass, possible malignancy versus small bowel diverticulitis  -Presented to [facility] with abdominal pain, nausea, vomiting, diarrhea, found to have duodenal mass,  thought to be possibly diverticulitis, possible malignancy.  EGD on 6/10 , upper EUS on 6/13 biopsies have been negative and empiric antibiotics were started in consideration of diverticulitis. -Patient continue to require NG suction,  on TPN, PPI, GI signed off on 6/16 , general surgery consulted - It appears that resection of the duodenal mass is not possible.  The available options are palliative surgical bypass (gastrojejunostomy), versus comfort measures, palliative care input  appreciated, on 6/17 family and patient decided move forward to residential hospice placement, family want to continue current treatment until discharge ,awaiting for residential hospice bed .   Atrial fibrillation /a flutter with RVR, SSS with pacer Heart rates 120s and 130s today.  On metoprolol and diltiazem by mouth at home. - Continue iv metoprolol, 5 mg every 6 - prn  IV diltiazem if blood pressure able to tolerate   -Continue current treatment until discharge to residential hospice, awaiting for bed   Chronic diastolic CHF Appears euvolemic   CKD stage IIIa AKI ruled out  COPD, Chronic hypoxic respiratory failure, On Home O2. -Continue ICS/LAMA - Continue Incruse - As needed bronchodilators         Disposition: Status is: Inpatient  Remains inpatient appropriate because:IV treatments appropriate due to intensity of illness or inability to take PO  Dispo: The patient is from: Home              Anticipated d/c is to: Residential hospice when there is a bed              Patient currently is ready to d/c, pending hospice bed   Difficult to place patient No     Level of care: Telemetry Medical       MDM: The below labs and imaging reports were reviewed and summarized above.  Medication management as above.     DVT prophylaxis: Place and maintain sequential compression device Start: 03/02/21 1804  Code Status: DNR Family Communication: Daughter at the bedside on 6/17    Procedures: EGD on 6/10  Upper EUS on 6/13 PICC line placement on 6/12  Subjective: 2.2L NG output documented last 24 hours , She is tolerating tpn, she denies pain, reports no bm  She has no confusion Daughter at bedside   She is in A. fib /a flutter, heart rate in the low 100's  Objective: Vitals:   03/07/21 2351 03/07/21 2354 03/08/21 0347 03/08/21 0353  BP: (!) 105/55 (!) 105/55 (!) 130/59   Pulse: (!) 133 (!) 127 (!) 114   Resp: 18 18 16    Temp:  98 F (36.7 C)  97.6 F (36.4 C)   TempSrc:  Oral Oral   SpO2: 99% 99% 99%   Weight:    68.5 kg  Height:        Intake/Output Summary (Last 24 hours) at 03/08/2021 0747 Last data filed at 03/08/2021 0357 Gross per 24 hour  Intake 2128.59 ml  Output 3025 ml  Net -896.41 ml   Filed Weights   03/06/21 0610 03/07/21 0300 03/08/21 0353  Weight: 69.8 kg 70.6 kg 68.5 kg    Examination: General appearance: Elderly adult female, alert and in mild distress.  Appears tired and uncomfortable, on NG suction HEENT: Anicteric, conjunctiva pink, lids and lashes normal. No nasal deformity, discharge, epistaxis.  Lips moist, NG tube in place, oropharynx tacky dry, no oral lesions.   Skin: Warm and dry.  no jaundice.  No suspicious rashes or lesions. Cardiac: Tachycardic, irregular, nl S1-S2, no murmurs appreciated.  Capillary refill is brisk.  JVP normal.  No LE edema.  Radial pulses 2+ and symmetric. Respiratory: Normal respiratory rate and rhythm.  CTAB without rales or wheezes. Abdomen: Abdomen soft.  There is tenderness to palpation, no guarding or rebound or rigidity. No ascites, distension, hepatosplenomegaly.   MSK: No deformities or effusions. Neuro: Awake and alert.  EOMI, moves all extremities with generalized weakness. Speech fluent.    Psych: Sensorium intact and responding to questions, attention normal. Affect normal.  Judgment and insight appear normal.    Data Reviewed: I have personally reviewed following labs and imaging studies:  CBC: Recent Labs  Lab 03/02/21 1748 03/04/21 0500 03/07/21 0313  WBC 9.9 10.6* 7.4  NEUTROABS  --  8.0*  --   HGB 13.8 12.9 11.8*  HCT 41.8 40.0 36.9  MCV 95.4 97.8 99.2  PLT 161 142* 098*   Basic Metabolic Panel: Recent Labs  Lab 03/03/21 0057 03/03/21 1004 03/04/21 0500 03/05/21 0910 03/06/21 0321 03/07/21 0313  NA 144  --  143 141 142 141  K 3.6  --  3.5 3.9 4.1 4.7  CL 105  --  103 108 109 112*  CO2 25  --  32 26 26 24   GLUCOSE 121*  --  144*  115* 115* 104*  BUN 34*  --  29* 30* 40* 48*  CREATININE 1.35*  --  1.27* 1.14* 1.25* 1.25*  CALCIUM 9.1  --  9.2 8.6* 8.6* 8.9  MG  --  2.0 2.0 1.9 1.8 1.9  PHOS  --  3.1 2.2* 3.0 3.8 3.7   GFR: Estimated Creatinine Clearance: 27.2 mL/min (A) (by C-G formula based on SCr of 1.25 mg/dL (H)). Liver Function Tests: Recent Labs  Lab 03/03/21 0057 03/04/21 0500 03/05/21 0910 03/06/21 0321 03/07/21 0313  AST 26 20 17 16 18   ALT 10 10 11 10 9   ALKPHOS 58 58 47 38 50  BILITOT 1.8* 1.8* 0.8 0.9 0.9  PROT 5.4* 5.7* 5.1* 4.7* 5.2*  ALBUMIN 2.8* 2.6* 2.3* 2.1* 2.3*   No results for input(s): LIPASE, AMYLASE in  the last 168 hours. No results for input(s): AMMONIA in the last 168 hours. Coagulation Profile: Recent Labs  Lab 03/02/21 0514 03/02/21 1748 03/07/21 0313  INR 1.4* 1.6* 1.2   Cardiac Enzymes: No results for input(s): CKTOTAL, CKMB, CKMBINDEX, TROPONINI in the last 168 hours. BNP (last 3 results) No results for input(s): PROBNP in the last 8760 hours. HbA1C: No results for input(s): HGBA1C in the last 72 hours. CBG: Recent Labs  Lab 03/05/21 0627 03/05/21 1328 03/05/21 1843 03/05/21 2350 03/06/21 0605  GLUCAP 123* 105* 101* 117* 120*   Lipid Profile: No results for input(s): CHOL, HDL, LDLCALC, TRIG, CHOLHDL, LDLDIRECT in the last 72 hours.  Thyroid Function Tests: No results for input(s): TSH, T4TOTAL, FREET4, T3FREE, THYROIDAB in the last 72 hours. Anemia Panel: No results for input(s): VITAMINB12, FOLATE, FERRITIN, TIBC, IRON, RETICCTPCT in the last 72 hours. Urine analysis:    Component Value Date/Time   COLORURINE YELLOW (A) 02/26/2021 1651   APPEARANCEUR HAZY (A) 02/26/2021 1651   LABSPEC 1.040 (H) 02/26/2021 1651   PHURINE 5.0 02/26/2021 1651   GLUCOSEU NEGATIVE 02/26/2021 1651   HGBUR NEGATIVE 02/26/2021 1651   BILIRUBINUR NEGATIVE 02/26/2021 1651   KETONESUR NEGATIVE 02/26/2021 1651   PROTEINUR NEGATIVE 02/26/2021 1651   NITRITE NEGATIVE  02/26/2021 1651   LEUKOCYTESUR TRACE (A) 02/26/2021 1651   Sepsis Labs: @LABRCNTIP (procalcitonin:4,lacticacidven:4)  ) Recent Results (from the past 240 hour(s))  SARS CORONAVIRUS 2 (TAT 6-24 HRS) Nasopharyngeal Nasopharyngeal Swab     Status: None   Collection Time: 02/26/21  2:32 PM   Specimen: Nasopharyngeal Swab  Result Value Ref Range Status   SARS Coronavirus 2 NEGATIVE NEGATIVE Final    Comment: (NOTE) SARS-CoV-2 target nucleic acids are NOT DETECTED.  The SARS-CoV-2 RNA is generally detectable in upper and lower respiratory specimens during the acute phase of infection. Negative results do not preclude SARS-CoV-2 infection, do not rule out co-infections with other pathogens, and should not be used as the sole basis for treatment or other patient management decisions. Negative results must be combined with clinical observations, patient history, and epidemiological information. The expected result is Negative.  Fact Sheet for Patients: SugarRoll.be  Fact Sheet for Healthcare Providers: https://www.woods-mathews.com/  This test is not yet approved or cleared by the [place] FDA and  has been authorized for detection and/or diagnosis of SARS-CoV-2 by FDA under an Emergency Use Authorization (EUA). This EUA will remain  in effect (meaning this test can be used) for the duration of the COVID-19 declaration under Se ction 564(b)(1) of the Act, 21 U.S.C. section 360bbb-3(b)(1), unless the authorization is terminated or revoked sooner.  Performed at [facility], [place] [address], [place], [place] [address]          Radiology Studies: DG Abd Portable 1V  Result Date: 03/07/2021 CLINICAL DATA:  NG tube placement EXAM: PORTABLE ABDOMEN - 1 VIEW COMPARISON:  Radiograph 03/03/2021, 03/01/2021 FINDINGS: Transesophageal tube tip terminates in the left upper quadrant within an air-filled stomach. Side port is positioned at  the GE junction. Consider advancing 3-5 cm for optimal positioning. Left chest wall pacer pack with leads at the right atrium and cardiac apex. Telemetry leads overlie the chest. Some persistent streaky opacities in the bilateral lung bases. Stable cardiomediastinal contours with a calcified, tortuous aorta. Air distended appearance of the stomach. IMPRESSION: Transesophageal tube side port at the GE junction. Consider advancing 3-5 cm for optimal positioning. Nonspecific gastric air distension. Atelectatic changes and/or scarring in the lung bases.  Aortic Atherosclerosis (ICD10-I70.0). Electronically Signed   By: Lovena Le M.D.   On: 03/07/2021 00:13        Scheduled Meds:  Chlorhexidine Gluconate Cloth  6 each Topical Daily   metoprolol tartrate  5 mg Intravenous Q6H   mometasone-formoterol  2 puff Inhalation BID   pantoprazole (PROTONIX) IV  40 mg Intravenous Q12H   sodium chloride flush  10-40 mL Intracatheter Q12H   umeclidinium bromide  1 puff Inhalation Daily   Continuous Infusions:  sodium chloride 10 mL/hr at 03/07/21 1718   ciprofloxacin 400 mg (03/07/21 1750)   metronidazole 500 mg (03/08/21 0349)   TPN ADULT (ION) 80 mL/hr at 03/07/21 1718     LOS: 6 days    Time spent: 25  minutes    Florencia Reasons, MD PhD FACP [facility] 03/08/2021, 7:47 AM     Please page though Shea Evans or Epic secure chat:  For [employer], Adult nurse

## 2021-03-09 LAB — BASIC METABOLIC PANEL
Anion gap: 5 (ref 5–15)
BUN: 42 mg/dL — ABNORMAL HIGH (ref 8–23)
CO2: 23 mmol/L (ref 22–32)
Calcium: 9.1 mg/dL (ref 8.9–10.3)
Chloride: 111 mmol/L (ref 98–111)
Creatinine, Ser: 0.95 mg/dL (ref 0.44–1.00)
GFR, Estimated: 57 mL/min — ABNORMAL LOW (ref >60)
Glucose, Bld: 101 mg/dL — ABNORMAL HIGH (ref 70–99)
Potassium: 4.9 mmol/L (ref 3.5–5.1)
Sodium: 139 mmol/L (ref 135–145)

## 2021-03-09 MED ORDER — ONDANSETRON HCL 4 MG PO TABS: 4.0000 mg | ORAL_TABLET | Freq: Four times a day (QID) | ORAL | 0 refills | Status: AC | PRN

## 2021-03-09 MED ORDER — ALPRAZOLAM 0.25 MG PO TABS: 0.2500 mg | ORAL_TABLET | Freq: Three times a day (TID) | ORAL | 0 refills | Status: AC | PRN

## 2021-03-09 MED ORDER — TRAVASOL 10 % IV SOLN
INTRAVENOUS | Status: DC
  Filled 2021-03-09: qty 960

## 2021-03-09 MED ORDER — METOPROLOL TARTRATE 25 MG PO TABS: 12.5000 mg | ORAL_TABLET | Freq: Two times a day (BID) | ORAL | 0 refills | Status: AC

## 2021-03-09 MED ORDER — BISACODYL 10 MG RE SUPP: 10.0000 mg | Freq: Every day | RECTAL | 0 refills | Status: AC

## 2021-03-09 MED ORDER — CIPROFLOXACIN HCL 500 MG PO TABS: 500.0000 mg | ORAL_TABLET | Freq: Two times a day (BID) | ORAL | 0 refills | Status: AC

## 2021-03-09 MED ORDER — FAMOTIDINE 40 MG/5ML PO SUSR: 40.0000 mg | Freq: Every day | ORAL | 0 refills | Status: AC

## 2021-03-09 MED ORDER — MORPHINE SULFATE 15 MG PO TABS: 15.0000 mg | ORAL_TABLET | ORAL | 0 refills | Status: AC | PRN

## 2021-03-09 MED ORDER — METRONIDAZOLE 500 MG PO TABS: 500.0000 mg | ORAL_TABLET | Freq: Two times a day (BID) | ORAL | 0 refills | Status: AC

## 2021-03-09 NOTE — Discharge Summary (Signed)
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    Discharge Condition: Improved  Diet recommendation: PEG tube  Vitals:   03/09/21 0834 03/09/21 1226  BP: (!) 116/98 (!) 108/45  Pulse: (!) 103 61  Resp: 15 20  Temp: 97.7 F (36.5 C)   SpO2: 100% 100%    History of present illness:  Per HPI: 87 year old F with PMH of COPD, CKD-3A, SSS s/p PPM, DVT on warfarin, CAD, diastolic CHF, HTN, HLD, GERD, PVD and remote left breast cancer presenting with abdominal pain, intermittent nausea, vomiting and diarrhea for about 2 weeks.  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  GI and general surgery were consulted. She had NGT placed. She has hx/o persistent afib, her HR increased yesterday to 130's. Cardiology was consulted. Patient was started on cardizem gtt, HR improved enough for her to undergo EGD, found with thickened folds mucosa in the antrum which was biopsied and duodenal deformit, also biopsies. Since patient needed MRI/MRCP  to evaluate for any bily duct abnormalities or mass causing the extrinsic compression. Since patient has pacemaker , patient was  transferred to [facility] for this.   Hospital Course:  Principal Problem:   Partial gastric outlet obstruction Active Problems:   Essential (primary) hypertension   Pacemaker   Atrial fibrillation with RVR (HCC)   COPD (chronic obstructive pulmonary disease) (HCC)   Abdominal pain   CKD (chronic kidney disease), stage IIIa   Duodenal mass   Goals of care, counseling/discussion   Nasogastric tube present  Patient was transferred to [facility] for further evaluation and treatment However was not able to proceed to his MRCP due to noncompatible pacemaker. General surgery/GI/palliative care we are consulted.  On March 04, 2021 upper endoscopy was done with biopsy but there were negative and empiric antibiotics were started in consideration for diverticulitis. Currently on NG suction, TPN, was initiated until discharge today which has been discontinued,. Resection of the duodenal mass was not possible patient was given option of palliative surgical bypass that is gastrojejunostomy versus comfort care, palliative care consult was therefore obtained  and after discussion patient and daughter on March 08, 2021 they agreed to comfort care only and to be transferred to residential hospice. Permanent atrial fibrillation heart rate remains in the 120s she was on metoprolol IV while in the hospital   Procedures:  Procedures: EGD on 6/10 Upper EUS on 6/13 PICC line placement on 6/12    Consultations: Palliative care General surgery Gastroenterology Pastoral care  Discharge Exam: BP (!) 108/45 (BP Location: Right Leg)   Pulse 61   Temp 97.7 F (36.5 C) (Oral)   Resp 20   Ht 5\' 3"  (1.6 m)   Wt  71 kg   SpO2 100%   BMI 27.73 kg/m   General: Alert oriented x3.  NG tube in place Cardiovascular: Regular rate and rhythm Respiratory: No respiratory distress clear to auscultation bilaterally  Discharge Instructions You were cared for by a hospitalist during your hospital stay. If you  have any questions about your discharge medications or the care you received while you were in the hospital after you are discharged, you can call the unit and asked to speak with the hospitalist on call if the hospitalist that took care of you is not available. Once you are discharged, your primary care physician will handle any further medical issues. Please note that NO REFILLS for any discharge medications will be authorized once you are discharged, as it is imperative that you return to your primary care physician (or establish a relationship with a primary care physician if you do not have one) for your aftercare needs so that they can reassess your need for medications and monitor your lab values.  Discharge Instructions     Call MD for:  persistant nausea and vomiting   Complete by: As directed    Call MD for:  temperature >100.4   Complete by: As directed    Discharge instructions   Complete by: As directed    Discharged [facility]   Diet is PEG feeding   Increase activity slowly   Complete by: As directed       Allergies as of 03/09/2021       Reactions   Penicillins Anaphylaxis   Amoxicillin    Beta Adrenergic Blockers    Meloxicam    Other reaction(s): Unknown   Naproxen    Other reaction(s): Unknown   Nsaids    Other reaction(s): Unknown        Medication List     STOP taking these medications    albuterol 108 (90 Base) MCG/ACT inhaler Commonly known as: VENTOLIN HFA   dilTIAZem HCl-Dextrose 125-5 MG/125ML-% Soln infusion   lactated ringers infusion   morphine 2 MG/ML injection Replaced by: morphine 15 MG tablet       TAKE these medications    acetaminophen 160 MG/5ML solution Commonly known as: TYLENOL Take 20.3 mLs (650 mg total) by mouth every 6 (six) hours as needed for mild pain.   ALPRAZolam 0.25 MG tablet Commonly known as: XANAX Place 1 tablet (0.25 mg total) into feeding tube 3 (three) times daily as needed for anxiety. What  changed: how to take this   bisacodyl 10 MG suppository Commonly known as: DULCOLAX Place 1 suppository (10 mg total) rectally daily. Start taking on: March 10, 2021   ciprofloxacin 500 MG tablet Commonly known as: Cipro Take 1 tablet (500 mg total) by mouth 2 (two) times daily for 5 days.   famotidine 40 MG/5ML suspension Commonly known as: Pepcid Place 5 mLs (40 mg total) into feeding tube daily.   ipratropium-albuterol 0.5-2.5 (3) MG/3ML Soln Commonly known as: DUONEB Take 3 mLs by nebulization every 6 (six) hours as needed.   metoprolol tartrate 25 MG tablet Commonly known as: LOPRESSOR Take 0.5 tablets (12.5 mg total) by mouth 2 (two) times daily.   metroNIDAZOLE 500 MG tablet Commonly known as: Flagyl Take 1 tablet (500 mg total) by mouth 2 (two) times daily for 5 days.   mometasone-formoterol 200-5 MCG/ACT Aero Commonly known as: DULERA Inhale 2 puffs into the lungs 2 (two) times daily.   morphine 15 MG tablet Commonly known as: MSIR Take  1 tablet (15 mg total) by mouth every 4 (four) hours as needed for severe pain. Replaces: morphine 2 MG/ML injection   nitroGLYCERIN 0.4 MG SL tablet Commonly known as: NITROSTAT Place 1 tablet (0.4 mg total) under the tongue as directed.   ondansetron 4 MG tablet Commonly known as: ZOFRAN Place 1 tablet (4 mg total) into feeding tube every 6 (six) hours as needed for nausea.       Allergies  Allergen Reactions   Penicillins Anaphylaxis   Amoxicillin    Beta Adrenergic Blockers    Meloxicam     Other reaction(s): Unknown   Naproxen     Other reaction(s): Unknown   Nsaids     Other reaction(s): Unknown      The results of significant diagnostics from this hospitalization (including imaging, microbiology, ancillary and laboratory) are listed below for reference.    Significant Diagnostic Studies: CT Abdomen Pelvis Wo Contrast  Result Date: 02/26/2021 CLINICAL DATA:  Acute, non localized abdominal pain. Nausea and  diarrhea. EXAM: CT ABDOMEN AND PELVIS WITHOUT CONTRAST TECHNIQUE: Multidetector CT imaging of the abdomen and pelvis was performed following the standard protocol without IV contrast. COMPARISON:  None. FINDINGS: Lower chest: Enlarged heart. Dense atheromatous coronary artery and aortic calcifications. Unremarkable lung bases. Hepatobiliary: Surgically absent gallbladder. Dilated common duct with a maximum diameter of 16.7 mm. No significant intrahepatic ductal dilatation. No visible intraductal stones. Pancreas: Unremarkable. No pancreatic ductal dilatation or surrounding inflammatory changes. Spleen: Normal in size without focal abnormality. Adrenals/Urinary Tract: Adrenal glands are unremarkable. Kidneys are normal, without renal calculi, focal lesion, or hydronephrosis. Bladder is unremarkable. Stomach/Bowel: Dilated stomach and proximal duodenum to the level of the junction of the 2nd and 3rd portions of the duodenum. There is soft tissue thickening at that location, including the region of the ampulla. Minimal sigmoid colon diverticulosis.  No evidence of appendicitis. Vascular/Lymphatic: Extensive atheromatous arterial calcifications, including the origins of both renal arteries, superior mesenteric artery and celiac axis. Reproductive: Status post hysterectomy. No adnexal masses. Other: Small umbilical hernia containing fat. Musculoskeletal: Lumbar and lower thoracic spine degenerative changes. Left hip total prosthesis. IMPRESSION: 1. Dilated stomach and proximal duodenum to the level of the junction of the 2nd and 3rd portions of the duodenum, possibly due to a duodenal mass at that location. The possible mass could also be obstructing the distal common duct at the ampulla. 2. Extensive calcific coronary artery and aortic atherosclerosis. Electronically Signed   By: Claudie Revering M.D.   On: 02/26/2021 14:48   DG Chest 2 View  Result Date: 02/26/2021 CLINICAL DATA:  Chest pain EXAM: CHEST - 2 VIEW  COMPARISON:  12/18/2020 FINDINGS: Chronic interstitial prominence. No new consolidation or edema. Stable elevation of the right hemidiaphragm. No pleural effusion. No pneumothorax. Stable cardiomediastinal contours. Left chest wall dual lead pacemaker. Diffusely decreased osseous mineralization with poor visualization of vertebral bodies. Right shoulder arthroplasty. Left shoulder osteoarthritis. IMPRESSION: No acute process in the chest. Electronically Signed   By: Macy Mis M.D.   On: 02/26/2021 13:57   DG Abd 1 View  Result Date: 03/01/2021 CLINICAL DATA:  Check gastric catheter placement EXAM: ABDOMEN - 1 VIEW COMPARISON:  Film from earlier in the same day. FINDINGS: Gastric catheter is again noted within the stomach. Proximal side port lies near the gastroesophageal junction. This could be advanced a few more cm deeper into the stomach. IMPRESSION: Gastric catheter as described which could be advanced further into the stomach. Electronically Signed   By: Elta Guadeloupe  Lukens M.D.   On: 03/01/2021 18:39   CT CHEST ABDOMEN PELVIS W CONTRAST  Result Date: 02/26/2021 CLINICAL DATA:  Nausea and left-sided chest pain. EXAM: CT CHEST, ABDOMEN, AND PELVIS WITH CONTRAST TECHNIQUE: Multidetector CT imaging of the chest, abdomen and pelvis was performed following the standard protocol during bolus administration of intravenous contrast. CONTRAST:  37mL OMNIPAQUE IOHEXOL 300 MG/ML  SOLN COMPARISON:  February 26, 2021 FINDINGS: CT CHEST FINDINGS Cardiovascular: There is marked severity calcification of the thoracic aorta. There is mild cardiomegaly with marked severity coronary artery calcification. No pericardial effusion. Mediastinum/Nodes: No enlarged mediastinal, hilar, or axillary lymph nodes. Thyroid gland, trachea, and esophagus demonstrate no significant findings. Lungs/Pleura: Very mild atelectasis is seen within the right lower lobe. There is no evidence of acute infiltrate, pleural effusion or pneumothorax.  Musculoskeletal: A right shoulder replacement is seen. Multilevel degenerative changes seen throughout the thoracic spine. CT ABDOMEN PELVIS FINDINGS Hepatobiliary: No focal liver abnormality is seen. Status post cholecystectomy. The common bile duct is dilated (1.3 cm). Pancreas: Unremarkable. No pancreatic ductal dilatation or surrounding inflammatory changes. Spleen: Normal in size without focal abnormality. Adrenals/Urinary Tract: Adrenal glands are unremarkable. Kidneys are normal, without renal calculi, focal lesion, or hydronephrosis. The urinary bladder is limited in evaluation secondary to overlying streak artifact. Stomach/Bowel: Stomach is within normal limits. Appendix appears normal. Persistent dilatation of the proximal duodenum is seen to the level of the junction of the second and third portions. Persistent soft tissue thickening is also seen at this level. Noninflamed diverticula are seen within the sigmoid colon. Vascular/Lymphatic: Aortic atherosclerosis. No enlarged abdominal or pelvic lymph nodes. Reproductive: Status post hysterectomy. No adnexal masses. Other: No abdominal wall hernia or abnormality. No abdominopelvic ascites. Musculoskeletal: Multilevel degenerative changes are noted throughout the lumbar spine. IMPRESSION: 1. No acute cardiopulmonary disease. 2. Persistent dilatation of the proximal duodenum which may represent sequelae associated with a duodenal mass. Further evaluation with a GI consult and subsequent endoscopy is recommended. 3. Sigmoid diverticulosis. Electronically Signed   By: Virgina Norfolk M.D.   On: 02/26/2021 16:35   DG Chest Port 1 View  Result Date: 03/03/2021 CLINICAL DATA:  Peripherally inserted central catheter (PICC) central line placement EXAM: PORTABLE CHEST 1 VIEW COMPARISON:  None. FINDINGS: There is a right upper extremity approach PICC with tip overlying the superior cavoatrial junction. Unchanged dual chamber pacemaker leads and enlarged  cardiomediastinal silhouette with tortuous thoracic aorta. Aortic calcifications. Right basilar subsegmental atelectasis. No other new focal airspace disease. There is no large pleural effusion or visible pneumothorax. There is an anatomic right shoulder arthroplasty. No acute osseous abnormality. IMPRESSION: Right upper extremity PICC tip overlies the superior cavoatrial junction. Right basilar subsegmental atelectasis. Electronically Signed   By: Maurine Simmering   On: 03/03/2021 12:32   DG Abd 2 Views  Result Date: 02/28/2021 CLINICAL DATA:  Gastric outlet obstruction, nausea, vomiting EXAM: ABDOMEN - 2 VIEW COMPARISON:  02/27/2021 FINDINGS: Gaseous distention of the stomach. Remainder bowel decompressed. No organomegaly or free air. Aortic calcifications. No visible aneurysm. Cardiomegaly. No confluent opacity in the visualized lungs. IMPRESSION: Gaseous distention of the stomach. Electronically Signed   By: Rolm Baptise M.D.   On: 02/28/2021 09:51   DG Abd Portable 1V  Result Date: 03/07/2021 CLINICAL DATA:  NG tube placement EXAM: PORTABLE ABDOMEN - 1 VIEW COMPARISON:  Radiograph 03/03/2021, 03/01/2021 FINDINGS: Transesophageal tube tip terminates in the left upper quadrant within an air-filled stomach. Side port is positioned at the GE junction. Consider advancing 3-5  cm for optimal positioning. Left chest wall pacer pack with leads at the right atrium and cardiac apex. Telemetry leads overlie the chest. Some persistent streaky opacities in the bilateral lung bases. Stable cardiomediastinal contours with a calcified, tortuous aorta. Air distended appearance of the stomach. IMPRESSION: Transesophageal tube side port at the GE junction. Consider advancing 3-5 cm for optimal positioning. Nonspecific gastric air distension. Atelectatic changes and/or scarring in the lung bases. Aortic Atherosclerosis (ICD10-I70.0). Electronically Signed   By: Lovena Le M.D.   On: 03/07/2021 00:13   DG Abd Portable  1V  Result Date: 02/27/2021 CLINICAL DATA:  Abdominal distension EXAM: PORTABLE ABDOMEN - 1 VIEW COMPARISON:  Portable exam 0902 hours without priors for comparison FINDINGS: Contrast material within kidneys, renal collecting systems, and bladder. Nonobstructive bowel gas pattern. No bowel dilatation or bowel wall thickening. Osseous demineralization with multilevel degenerative disc and facet disease changes of thoracolumbar spine. Atherosclerotic calcification aorta. LEFT hip prosthesis. IMPRESSION: Nonobstructive bowel gas pattern. Aortic Atherosclerosis (ICD10-I70.0). Electronically Signed   By: Lavonia Dana M.D.   On: 02/27/2021 11:00   DG Abd Portable 2V  Result Date: 03/01/2021 CLINICAL DATA:  Constipation EXAM: PORTABLE ABDOMEN - 2 VIEW COMPARISON:  February 28, 2021. FINDINGS: Supine and upright images obtained. Nasogastric tube tip and side port in stomach. There is moderate air in stomach. There is moderate stool in colon. There is no bowel dilatation or air-fluid level to suggest bowel obstruction. No evident free air. Status post total hip replacement on the left. Pacemaker leads attached to right atrium and right ventricle. There is aortic atherosclerosis. There is mild bibasilar lung atelectasis. IMPRESSION: Nasogastric tube tip and side port in stomach. No bowel obstruction or free air evident. Moderate air in stomach. Bibasilar atelectasis.  Aortic Atherosclerosis (ICD10-I70.0). Electronically Signed   By: Lowella Grip III M.D.   On: 03/01/2021 09:05   DG [place] Dys Tube Plc W/Fl W/Rad  Result Date: 02/28/2021 CLINICAL DATA:  NG tube placement. EXAM: NASO G TUBE PLACEMENT WITH FL AND WITH RAD CONTRAST:  None. FLUOROSCOPY TIME:  Fluoroscopy Time:  0 minutes 48 seconds Radiation Exposure Index (if provided by the fluoroscopic device): 5.4 mGy Number of Acquired Spot Images: 2 COMPARISON:  Abdomen 02/28/2021. FINDINGS: NG tube successfully placed under fluoroscopic guidance. Tube tip and side hole  placed are in the stomach. Gastric distention noted. Cardiac pacer noted. IMPRESSION: NG tube successfully placed under fluoroscopic guidance. Gastric distention noted. Electronically Signed   By: Marcello Moores  Register   On: 02/28/2021 08:48   [place] EKG SITE RITE  Result Date: 03/03/2021 If Site Rite image not attached, placement could not be confirmed due to current cardiac rhythm.   Microbiology: No results found for this or any previous visit (from the past 240 hour(s)).   Labs: Basic Metabolic Panel: Recent Labs  Lab 03/03/21 1004 03/04/21 0500 03/05/21 0910 03/06/21 0321 03/07/21 0313 03/09/21 0500  NA  --  143 141 142 141 139  K  --  3.5 3.9 4.1 4.7 4.9  CL  --  103 108 109 112* 111  CO2  --  32 26 26 24 23   GLUCOSE  --  144* 115* 115* 104* 101*  BUN  --  29* 30* 40* 48* 42*  CREATININE  --  1.27* 1.14* 1.25* 1.25* 0.95  CALCIUM  --  9.2 8.6* 8.6* 8.9 9.1  MG 2.0 2.0 1.9 1.8 1.9  --   PHOS 3.1 2.2* 3.0 3.8 3.7  --    Liver  Function Tests: Recent Labs  Lab 03/03/21 0057 03/04/21 0500 03/05/21 0910 03/06/21 0321 03/07/21 0313  AST 26 20 17 16 18   ALT 10 10 11 10 9   ALKPHOS 58 58 47 38 50  BILITOT 1.8* 1.8* 0.8 0.9 0.9  PROT 5.4* 5.7* 5.1* 4.7* 5.2*  ALBUMIN 2.8* 2.6* 2.3* 2.1* 2.3*   No results for input(s): LIPASE, AMYLASE in the last 168 hours. No results for input(s): AMMONIA in the last 168 hours. CBC: Recent Labs  Lab 03/02/21 1748 03/04/21 0500 03/07/21 0313  WBC 9.9 10.6* 7.4  NEUTROABS  --  8.0*  --   HGB 13.8 12.9 11.8*  HCT 41.8 40.0 36.9  MCV 95.4 97.8 99.2  PLT 161 142* 121*   Cardiac Enzymes: No results for input(s): CKTOTAL, CKMB, CKMBINDEX, TROPONINI in the last 168 hours. BNP: BNP (last 3 results) Recent Labs    12/18/20 1257 02/26/21 1305  BNP 493.6* 531.8*    ProBNP (last 3 results) No results for input(s): PROBNP in the last 8760 hours.  CBG: Recent Labs  Lab 03/05/21 0627 03/05/21 1328 03/05/21 1843 03/05/21 2350  03/06/21 0605  GLUCAP 123* 105* 101* 117* 120*       Signed:  Cristal Deer, MD [facility] 03/09/2021, 1:23 PM

## 2021-03-09 NOTE — TOC Transition Note (Signed)
Transition of Care [facility]) - CM/SW Discharge Note   Patient Details  Name: DELCENIA INMAN MRN: [id number] Date of Birth: 12/26/1926  Transition of Care [facility]) CM/SW Contact:  Bary Castilla, LCSW Phone Number: [phone] 03/09/2021, 12:37 PM   Clinical Narrative:    Patient will DC to:?[facility] Anticipated DC date:?03/09/2021 Family notified:?[name] Transport by: Corey Harold   Per MD patient ready for DC to [facility]. RN, patient, patient's family, and facility notified of DC. Discharge Summary sent to facility. RN given number for report [phone]. DC packet on chart. Ambulance transport requested for patient.   CSW signing off.   Vallery Ridge, [place] [phone]    Final next level of care: [place] Barriers to Discharge: Barriers Resolved   Patient Goals and CMS Choice Patient states their goals for this hospitalization and ongoing recovery are:: Return to being independent CMS Medicare.gov Compare Post Acute Care list provided to:: Patient Choice offered to / list presented to : Patient, Adult Children  Discharge Placement              Patient chooses bed at:  [facility]) Patient to be transferred to facility by: [place] Name of family member notified: Joann Patient and family notified of of transfer: 03/09/21  Discharge Plan and Services     Post Acute Care Choice: [place]                               Social Determinants of Health (SDOH) Interventions     Readmission Risk Interventions No flowsheet data found.

## 2021-03-09 NOTE — Progress Notes (Signed)
[facility]) Hospital Liaison RN note:  Hospice eligibility confirmed. There is a room available at [facility] today.   Spoke with patient's daughter, Lauris Poag to notify of above. Patient/family agreeable to transfer today. Vallery Ridge, LCSW [facility] manager aware.   RN please call report to [phone] prior to patient leaving the unit.   Please send signed and completed DNR with patient at discharge.   Please call with any questions or concerns.   Thank you for the opportunity to participate in this patient's care.   Bobbie "Loren Racer, RN, BSN [facility] Liaison [phone]

## 2021-03-09 NOTE — Progress Notes (Signed)
Report given to receiving nurse at [facility]. All questions answered.

## 2021-03-09 NOTE — Progress Notes (Signed)
Pt discharged to [facility]. Pt left with all of her belongings. Pt left via [facility].

## 2021-03-09 NOTE — Progress Notes (Signed)
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 Usual Weight: unknown  Assessment:  84 yof experiencing N/V and abdominal pain with distention progressing over two week period who presented to [facility] ED on 6/7. 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  NGT placed on LIWS. Surgery and GI consulted. EGD with findings of thickened folds mucosa in the antrum, extrinsic deformity second portion of duodenum s/p biopsy, no obvious mass. MRCP recommended but unable to be done due to PPM. Transferred to [facility] for continued work-up.  Plan is to continue current interventions while awaiting [facility].  Glucose / Insulin: no hx DM, CBGs controlled, SSI d/c'd 6/15 Electrolytes: K 4.9 (reduced in TPN 6/16) Renal: CKD, SCr 0.95 (Baseline 1.3), BUN 42 Hepatic: LFTs WNL, TG 100, Tbili normalized, albumin 2.1, prealbumin 9.1 Intake / Output; MIVF: UOP 549ml, emesis/NG output 2800 ml, no MIVF GI Imaging: 6/7 CT A/P: dilation of the proximal duodenum which may represent duodenal mass  6/10 Abd XR: no bowel obstruction or free air, mod air in stomach GI Surgeries / Procedures:  6/10 EGD: biopsied thickened folds mucosa and duodenal deformity however no obvious mass  6/13 EGD: no lesions in esophagus; found non-bleeding gastric ulcers, duodenal stenosis w/ inflamed mucosa, and likely small bowel diverticulitis  Central access: triple lumen PICC placed 6/12 TPN start date: 03/03/21  Nutritional Goals (per RD recommendation 6/13): kCal: 1750-1950, Protein: 90-105 g, Fluid: >1.7L Goal TPN rate is 80 mL/hr (provides 95 g of protein and ~1900 kcals per  day)  Current Nutrition:  NPO and TPN  Plan:  No changes to TPN today Continue TPN at goal of 8mL/hr at 1800  Electrolytes in TPN: Na 42mEq/L, K 50mEq/L, Ca 73mEq/L, Mg 38mEq/L, and Phos 62mmol/L. Cl:Ac 1:2 Add standard MVI and trace elements to TPN Monitor TPN labs on Mon/Thurs   Thank you for involving pharmacy in this patient's care.  Renold Genta, PharmD, BCPS Clinical Pharmacist Clinical phone for 03/09/2021 until 3p is [phone] 03/09/2021 7:14 AM  **Pharmacist phone directory can be found on [email or web address] listed under [place]**

## 2021-03-13 ENCOUNTER — Ambulatory Visit: Payer: PPO | Admitting: Internal Medicine

## 2021-03-20 ENCOUNTER — Ambulatory Visit: Payer: PPO | Admitting: Internal Medicine

## 2021-03-22 DEATH — deceased

## 2022-12-19 IMAGING — CR DG CHEST 2V
1 series · 2 of 2 positions shown · non-contrast
Comparison: PA and lateral chest 09/18/2020.

CLINICAL DATA: Increasing shortness of breath yesterday and today.

EXAM:
CHEST - 2 VIEW

[Series 1: dg chest 2 view · 0.14mm/px · 2 of 2 slices shown]
[im 1/2]
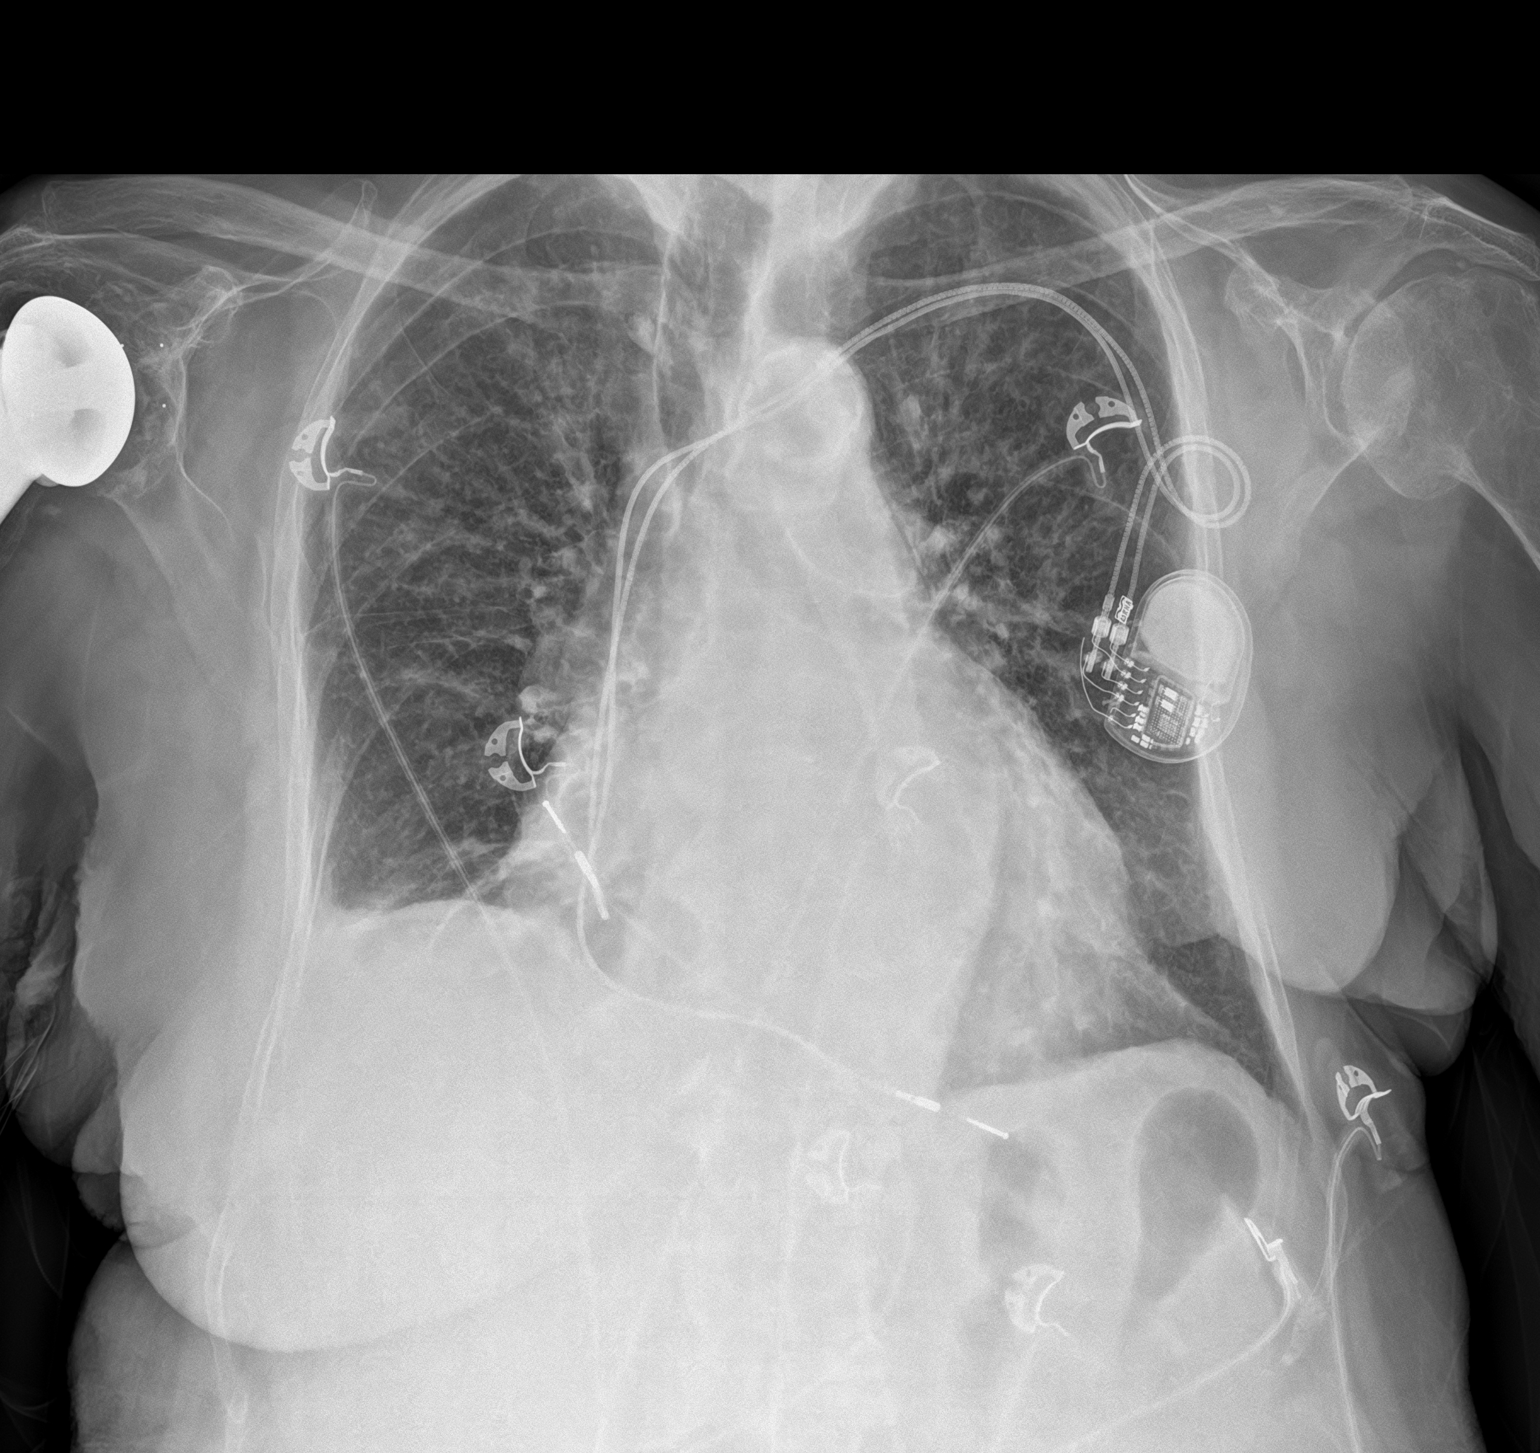
[im 2/2]
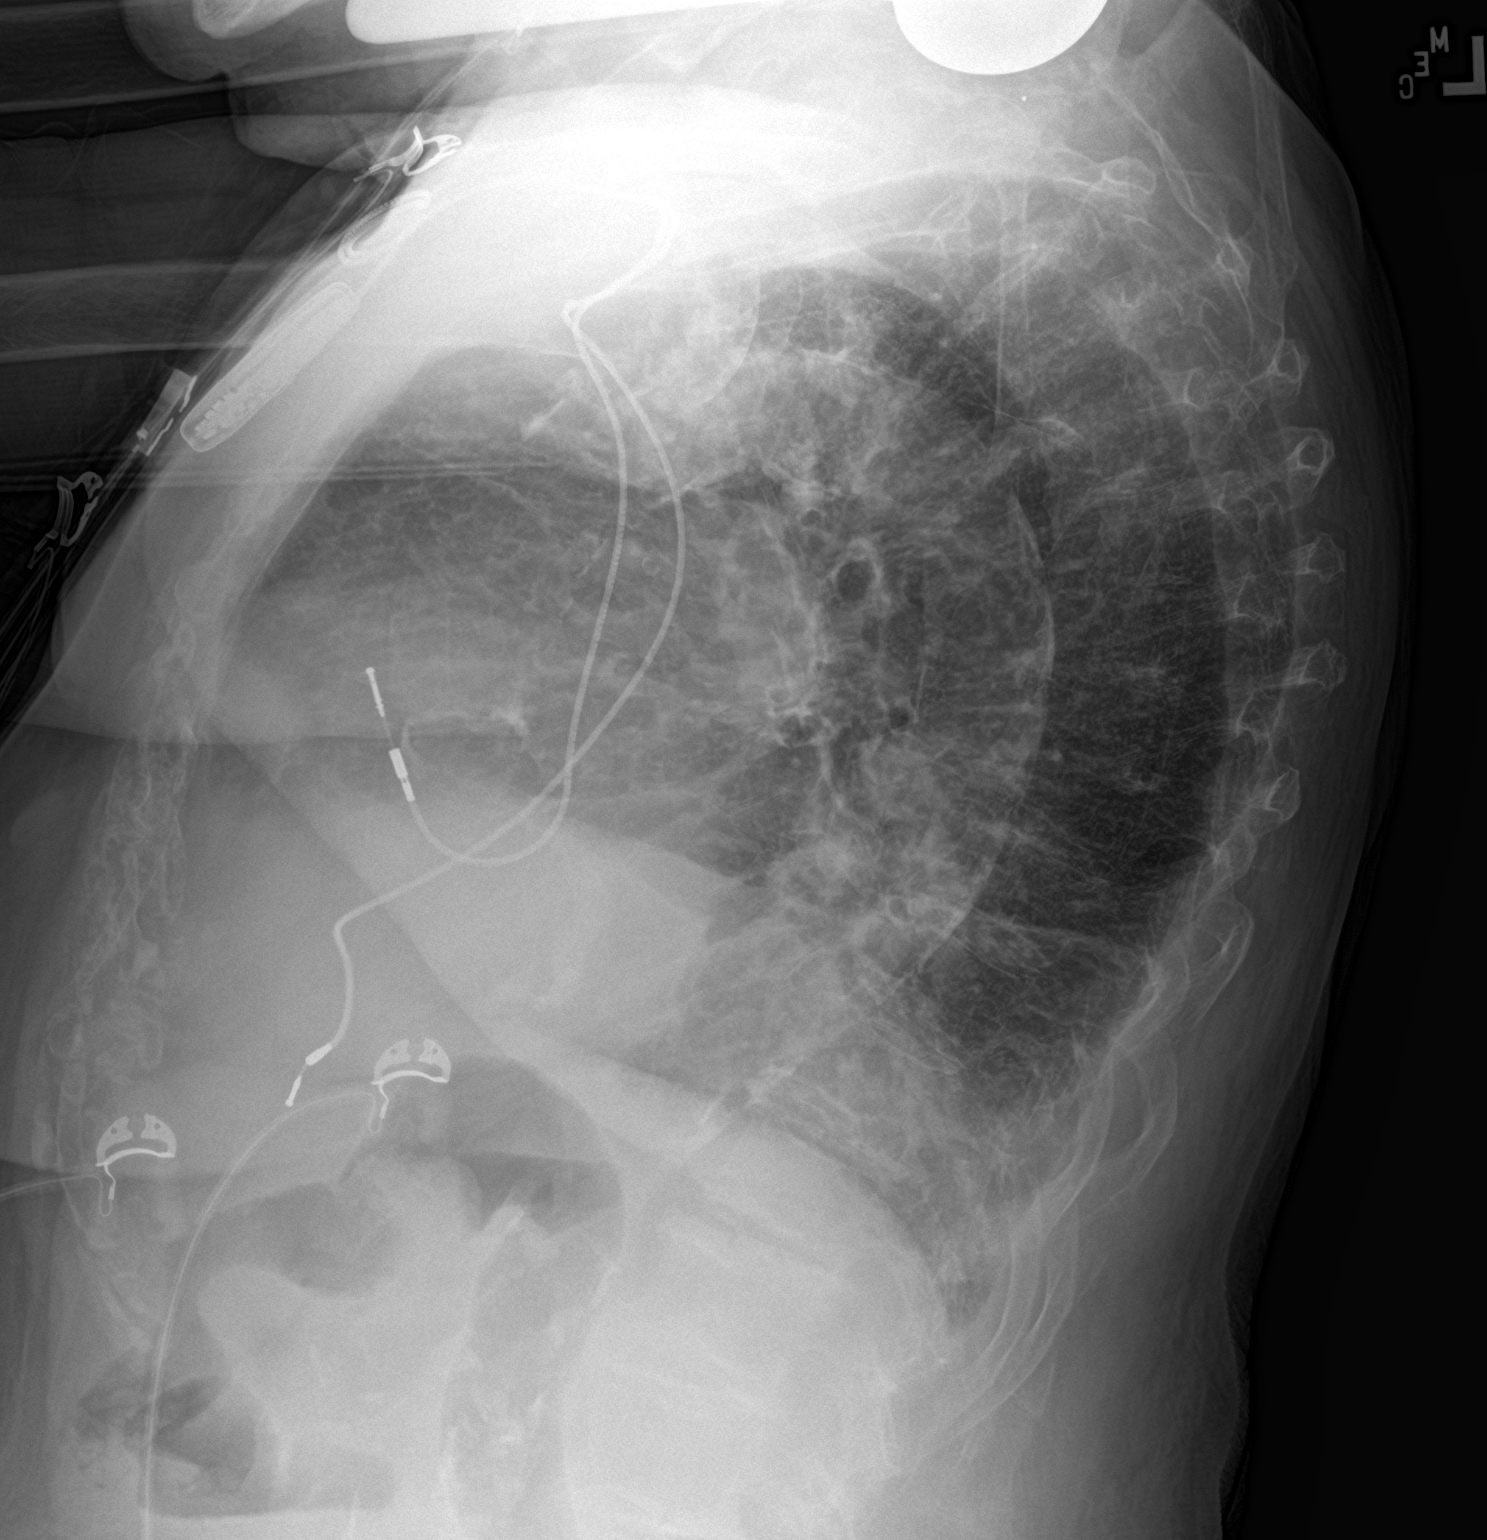

[2 of 2 positions shown; findings below may reference images not displayed]

FINDINGS: The left lung is clear. Small right pleural effusion has decreased
since the prior examination. There is cardiomegaly without edema. No
pneumothorax. Aortic atherosclerosis is noted.
IMPRESSION: Small right pleural effusion is improved compared to the prior
study. No new abnormality.

Cardiomegaly.

Aortic Atherosclerosis ([id number].[id number]).
